# Patient Record
Sex: Female | Born: 1955 | Race: Black or African American | Hispanic: No | Marital: Married | Smoking: Former smoker
Health system: Southern US, Community
[De-identification: ages and names within clinical notes are randomized; demographics above are authoritative.]

## PROBLEM LIST (undated history)

## (undated) DIAGNOSIS — C801 Malignant (primary) neoplasm, unspecified: Secondary | ICD-10-CM

## (undated) DIAGNOSIS — D649 Anemia, unspecified: Secondary | ICD-10-CM

## (undated) DIAGNOSIS — I10 Essential (primary) hypertension: Secondary | ICD-10-CM

## (undated) DIAGNOSIS — Z992 Dependence on renal dialysis: Secondary | ICD-10-CM

## (undated) DIAGNOSIS — I509 Heart failure, unspecified: Secondary | ICD-10-CM

## (undated) DIAGNOSIS — M1990 Unspecified osteoarthritis, unspecified site: Secondary | ICD-10-CM

## (undated) DIAGNOSIS — G629 Polyneuropathy, unspecified: Secondary | ICD-10-CM

## (undated) DIAGNOSIS — K219 Gastro-esophageal reflux disease without esophagitis: Secondary | ICD-10-CM

## (undated) DIAGNOSIS — I2510 Atherosclerotic heart disease of native coronary artery without angina pectoris: Secondary | ICD-10-CM

## (undated) DIAGNOSIS — J449 Chronic obstructive pulmonary disease, unspecified: Secondary | ICD-10-CM

## (undated) DIAGNOSIS — E119 Type 2 diabetes mellitus without complications: Secondary | ICD-10-CM

## (undated) DIAGNOSIS — N189 Chronic kidney disease, unspecified: Secondary | ICD-10-CM

## (undated) DIAGNOSIS — F419 Anxiety disorder, unspecified: Secondary | ICD-10-CM

## (undated) DIAGNOSIS — I739 Peripheral vascular disease, unspecified: Secondary | ICD-10-CM

## (undated) DIAGNOSIS — I639 Cerebral infarction, unspecified: Secondary | ICD-10-CM

## (undated) DIAGNOSIS — M109 Gout, unspecified: Secondary | ICD-10-CM

## (undated) DIAGNOSIS — R0600 Dyspnea, unspecified: Secondary | ICD-10-CM

## (undated) DIAGNOSIS — Z87442 Personal history of urinary calculi: Secondary | ICD-10-CM

## (undated) DIAGNOSIS — G4730 Sleep apnea, unspecified: Secondary | ICD-10-CM

## (undated) HISTORY — PX: ABDOMINAL HYSTERECTOMY: SHX81

## (undated) HISTORY — DX: Type 2 diabetes mellitus without complications: E11.9

## (undated) HISTORY — DX: Essential (primary) hypertension: I10

## (undated) HISTORY — DX: Chronic kidney disease, unspecified: N18.9

## (undated) HISTORY — PX: CARDIAC CATHETERIZATION: SHX172

## (undated) HISTORY — DX: Gout, unspecified: M10.9

## (undated) HISTORY — PX: KIDNEY SURGERY: SHX687

## (undated) HISTORY — PX: EYE SURGERY: SHX253

---

## 2002-12-03 DIAGNOSIS — I639 Cerebral infarction, unspecified: Secondary | ICD-10-CM

## 2002-12-03 HISTORY — DX: Cerebral infarction, unspecified: I63.9

## 2006-08-01 ENCOUNTER — Ambulatory Visit: Payer: Self-pay | Admitting: Nephrology

## 2006-08-02 ENCOUNTER — Other Ambulatory Visit: Payer: Self-pay

## 2006-08-02 ENCOUNTER — Ambulatory Visit: Payer: Self-pay | Admitting: Vascular Surgery

## 2006-08-09 ENCOUNTER — Ambulatory Visit: Payer: Self-pay | Admitting: Vascular Surgery

## 2007-01-31 ENCOUNTER — Ambulatory Visit: Payer: Self-pay | Admitting: Vascular Surgery

## 2007-02-04 ENCOUNTER — Ambulatory Visit: Payer: Self-pay | Admitting: Internal Medicine

## 2007-02-17 ENCOUNTER — Other Ambulatory Visit: Payer: Self-pay

## 2007-02-17 ENCOUNTER — Ambulatory Visit: Payer: Self-pay | Admitting: Vascular Surgery

## 2007-02-26 ENCOUNTER — Ambulatory Visit: Payer: Self-pay | Admitting: Vascular Surgery

## 2007-03-05 ENCOUNTER — Ambulatory Visit: Payer: Self-pay | Admitting: Nephrology

## 2007-08-11 ENCOUNTER — Ambulatory Visit: Payer: Self-pay | Admitting: Vascular Surgery

## 2007-10-07 ENCOUNTER — Ambulatory Visit: Payer: Self-pay | Admitting: Nephrology

## 2007-12-24 ENCOUNTER — Emergency Department: Payer: Self-pay | Admitting: Emergency Medicine

## 2007-12-24 ENCOUNTER — Other Ambulatory Visit: Payer: Self-pay

## 2008-10-23 ENCOUNTER — Inpatient Hospital Stay: Payer: Self-pay | Admitting: Internal Medicine

## 2008-12-13 ENCOUNTER — Emergency Department: Payer: Self-pay | Admitting: Internal Medicine

## 2009-01-25 ENCOUNTER — Ambulatory Visit: Payer: Self-pay | Admitting: Cardiology

## 2009-01-25 ENCOUNTER — Inpatient Hospital Stay: Payer: Self-pay | Admitting: Internal Medicine

## 2009-06-01 ENCOUNTER — Emergency Department: Payer: Self-pay | Admitting: Emergency Medicine

## 2009-08-08 ENCOUNTER — Emergency Department: Payer: Self-pay | Admitting: Emergency Medicine

## 2009-08-09 ENCOUNTER — Inpatient Hospital Stay: Payer: Self-pay | Admitting: Specialist

## 2009-12-19 ENCOUNTER — Emergency Department: Payer: Self-pay | Admitting: Emergency Medicine

## 2010-02-24 IMAGING — CT CT HEAD WITHOUT CONTRAST
2 of 3 series · 17 of 30 positions shown, 20 images · non-contrast
Comparison: none

REASON FOR EXAM: fall/epistaxis
COMMENTS:

[Series 3: bone · axial · 0.38mm/px · z∈[+394,+504]mm · 7 of 30 slices shown]
[im 4/30  bone]
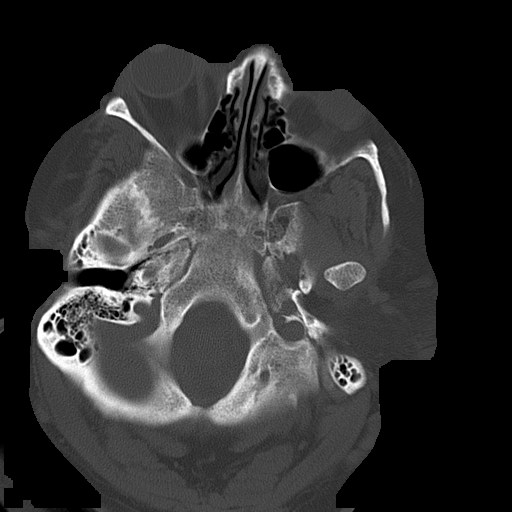
[im 8/30  bone]
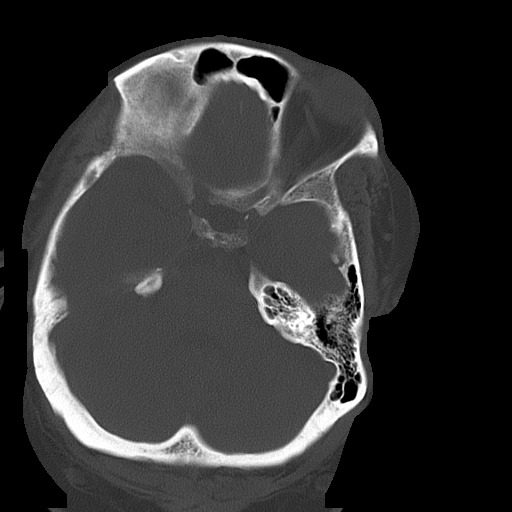
[im 11/30  bone]
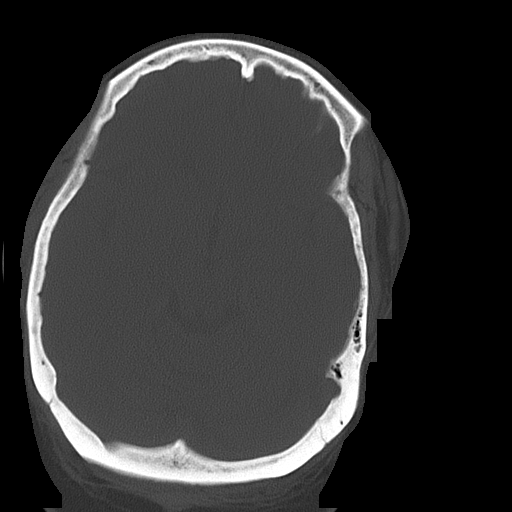
[im 15/30  bone]
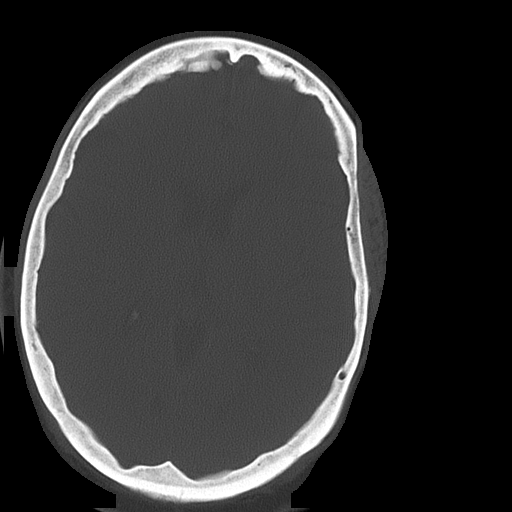
[im 19/30  bone]
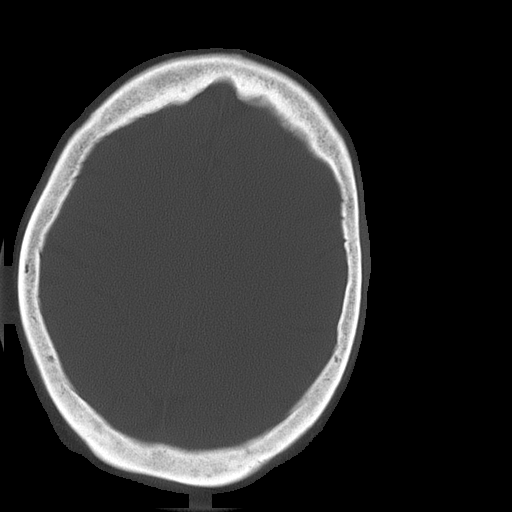
[im 22/30  bone]
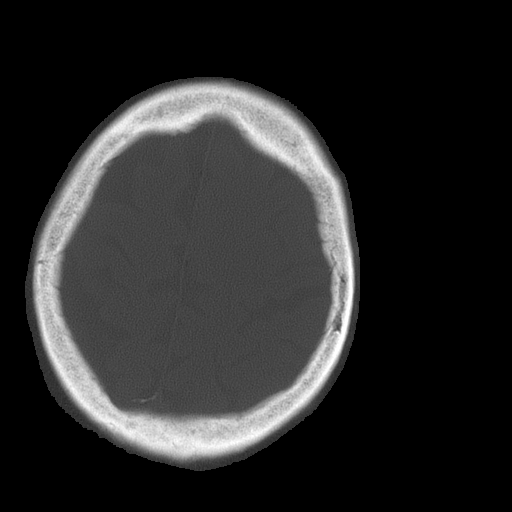
[im 26/30  bone]
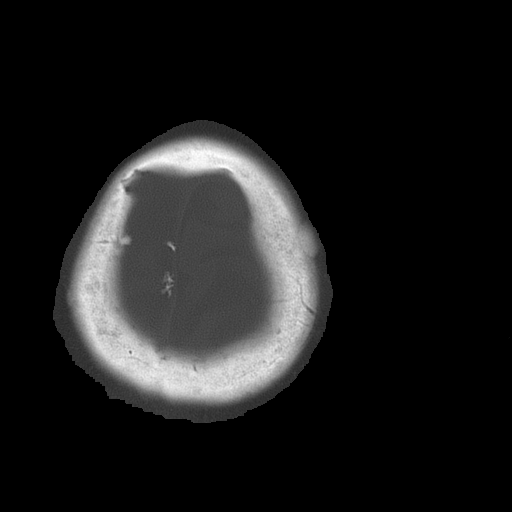

[Series 604: (id) · axial · 0.38mm/px · z∈[+356,+507]mm · 10 of 39 slices shown, 13 images]
[im 4/39  brain]
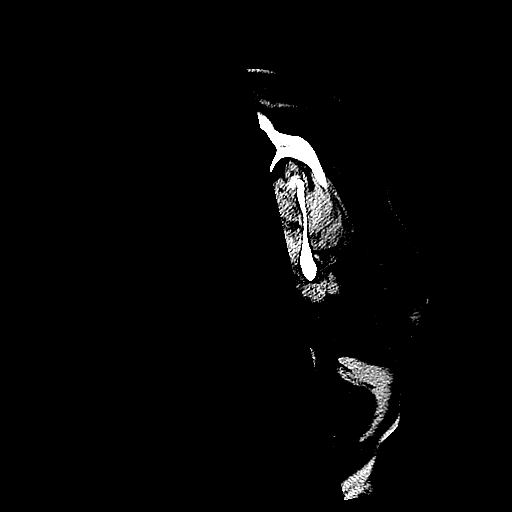
[im 4/39  bone]
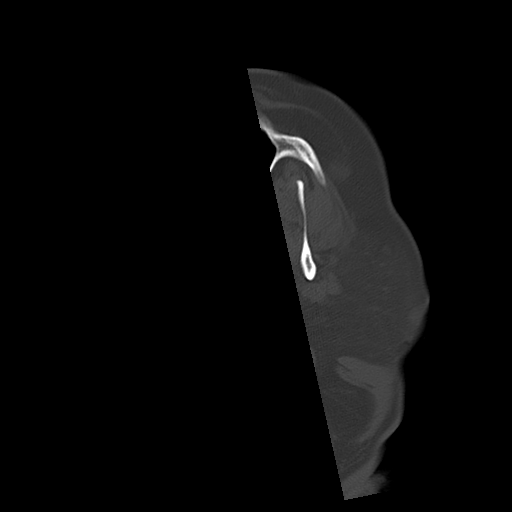
[im 7/39  brain]
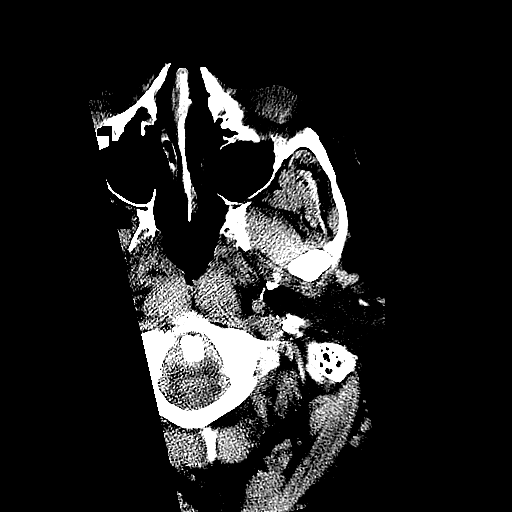
[im 11/39  brain]
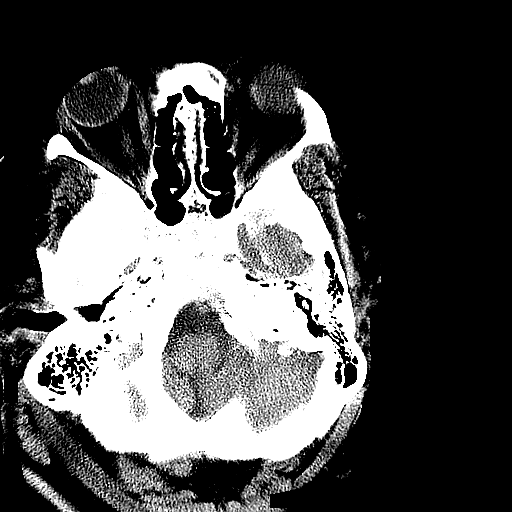
[im 14/39  brain]
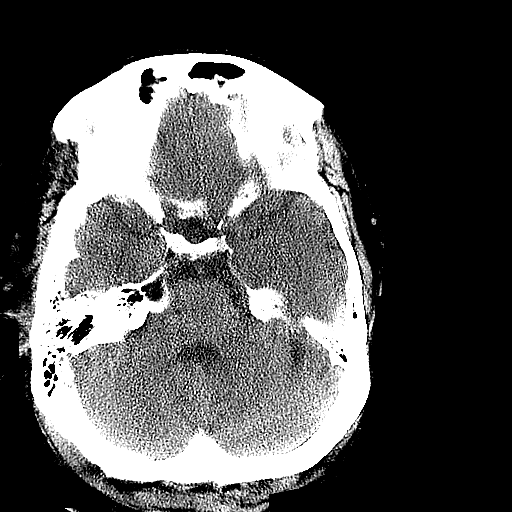
[im 18/39  brain]
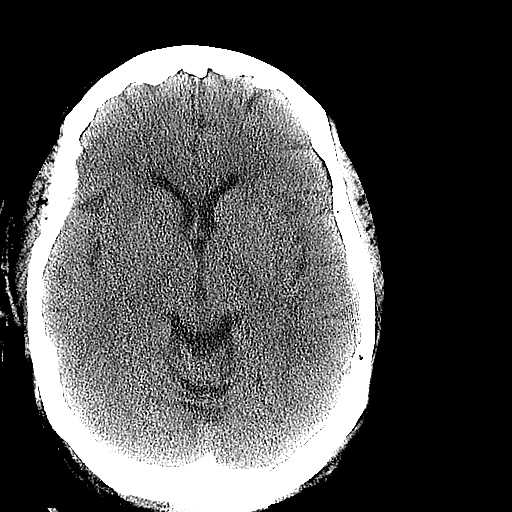
[im 18/39  bone]
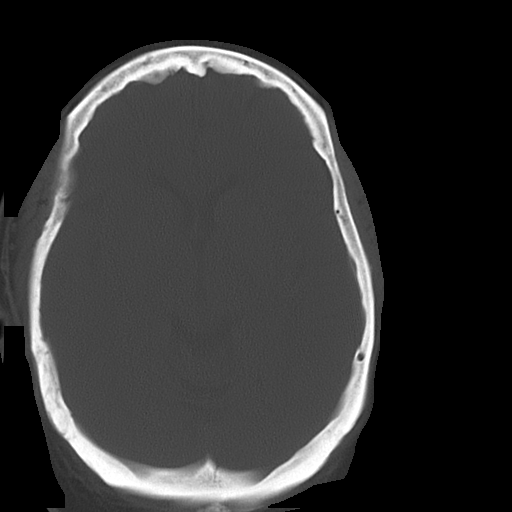
[im 21/39  brain]
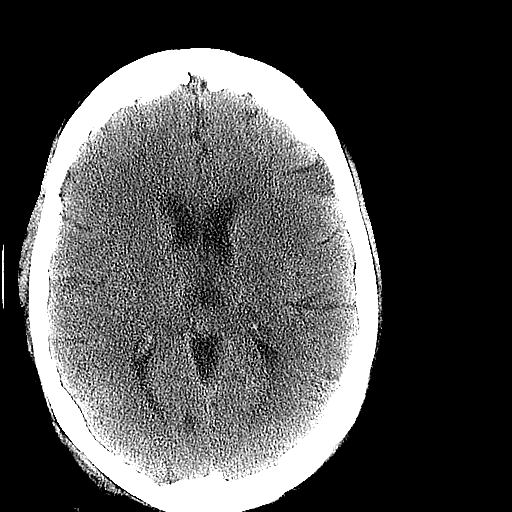
[im 25/39  brain]
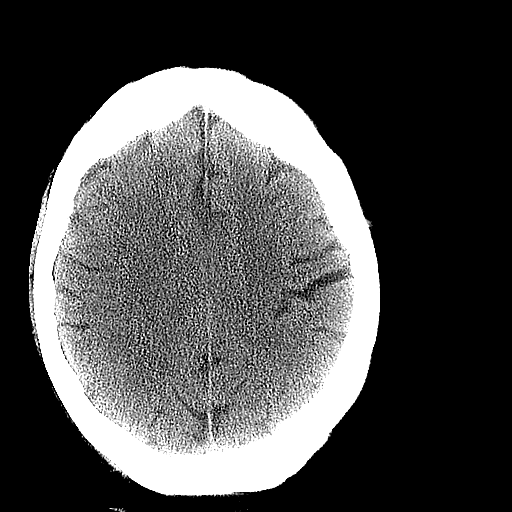
[im 28/39  brain]
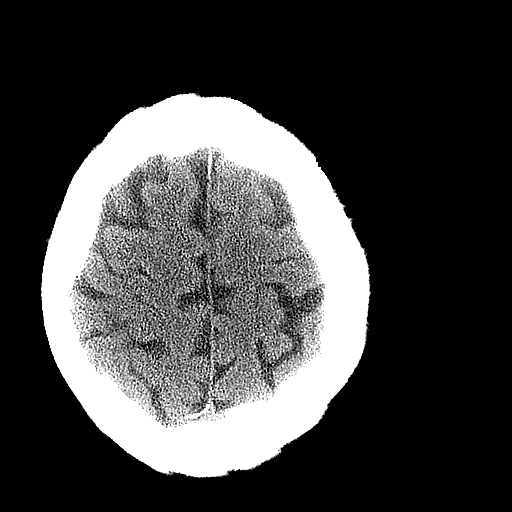
[im 32/39  brain]
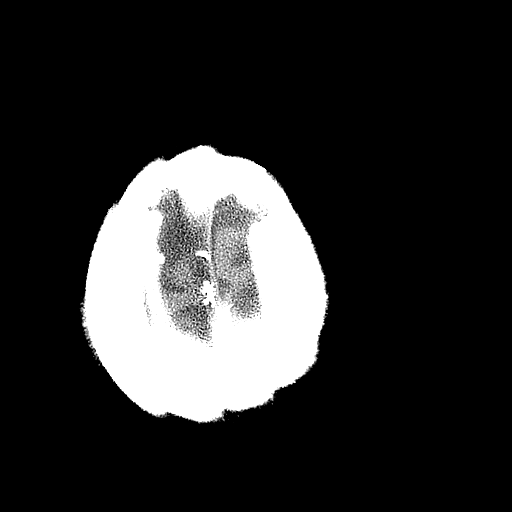
[im 32/39  bone]
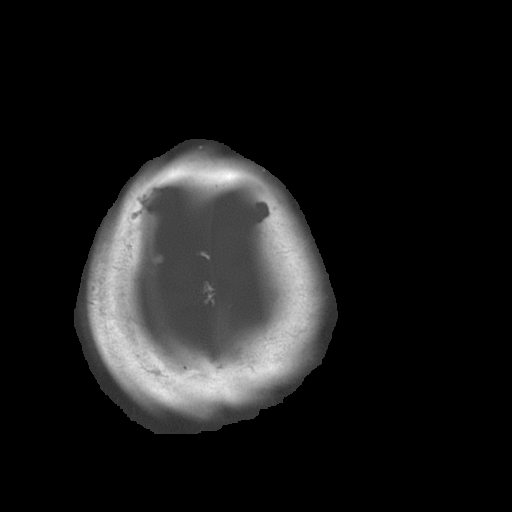
[im 35/39  brain]
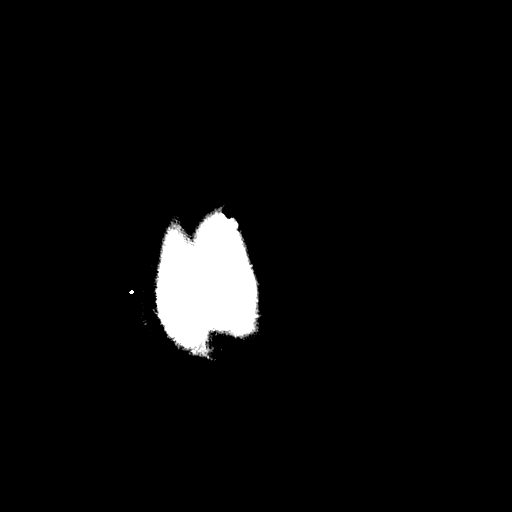

[17 of 30 positions shown; findings below may reference images not displayed]

PROCEDURE:     CT  - CT HEAD WITHOUT CONTRAST  - December 19, 2009 [DATE]

RESULT:     Axial CT scanning was performed through the brain at 5 mm
intervals and slice thicknesses. Comparison is made to the study 23 September, 2009.

The ventricles are normal in size and position. There is no intracranial
hemorrhage nor intracranial mass effect. The cerebellum and brainstem are
normal in density. At bone window settings I do not see evidence of an acute
skull fracture.
IMPRESSION: I do not see evidence of acute intracranial abnormality.
The paranasal sinuses exhibit no evidence of acute inflammation. The nasal
passages appear patent where visualized.

## 2010-08-04 ENCOUNTER — Emergency Department: Payer: Self-pay | Admitting: Emergency Medicine

## 2010-12-17 ENCOUNTER — Emergency Department: Payer: Self-pay | Admitting: Emergency Medicine

## 2011-02-12 DIAGNOSIS — M1991 Primary osteoarthritis, unspecified site: Secondary | ICD-10-CM | POA: Insufficient documentation

## 2011-02-13 ENCOUNTER — Observation Stay: Payer: Self-pay | Admitting: Internal Medicine

## 2011-03-16 ENCOUNTER — Emergency Department: Payer: Self-pay | Admitting: Emergency Medicine

## 2011-04-18 ENCOUNTER — Emergency Department: Payer: Self-pay | Admitting: Emergency Medicine

## 2011-05-02 ENCOUNTER — Ambulatory Visit: Payer: Self-pay | Admitting: Vascular Surgery

## 2011-05-03 DIAGNOSIS — K8020 Calculus of gallbladder without cholecystitis without obstruction: Secondary | ICD-10-CM | POA: Insufficient documentation

## 2011-05-14 ENCOUNTER — Ambulatory Visit: Payer: Self-pay | Admitting: Ophthalmology

## 2011-05-14 DIAGNOSIS — I119 Hypertensive heart disease without heart failure: Secondary | ICD-10-CM

## 2011-05-15 ENCOUNTER — Ambulatory Visit: Payer: Self-pay | Admitting: Ophthalmology

## 2011-06-20 ENCOUNTER — Ambulatory Visit: Payer: Self-pay | Admitting: Ophthalmology

## 2011-07-02 ENCOUNTER — Ambulatory Visit: Payer: Self-pay | Admitting: Ophthalmology

## 2011-07-24 ENCOUNTER — Inpatient Hospital Stay: Payer: Self-pay | Admitting: Internal Medicine

## 2011-12-10 ENCOUNTER — Observation Stay: Payer: Self-pay | Admitting: Internal Medicine

## 2011-12-10 LAB — COMPREHENSIVE METABOLIC PANEL
Albumin: 3.2 g/dL — ABNORMAL LOW (ref 3.4–5.0)
Alkaline Phosphatase: 94 U/L (ref 50–136)
Anion Gap: 13 (ref 7–16)
BUN: 61 mg/dL — ABNORMAL HIGH (ref 7–18)
Bilirubin,Total: 0.3 mg/dL (ref 0.2–1.0)
Calcium, Total: 8.7 mg/dL (ref 8.5–10.1)
Chloride: 99 mmol/L (ref 98–107)
Co2: 28 mmol/L (ref 21–32)
Creatinine: 10.94 mg/dL — ABNORMAL HIGH (ref 0.60–1.30)
EGFR (African American): 5 — ABNORMAL LOW
EGFR (Non-African Amer.): 4 — ABNORMAL LOW
Glucose: 89 mg/dL (ref 65–99)
Osmolality: 296 (ref 275–301)
Potassium: 4.9 mmol/L (ref 3.5–5.1)
SGOT(AST): 17 U/L (ref 15–37)
SGPT (ALT): 33 U/L
Sodium: 140 mmol/L (ref 136–145)
Total Protein: 7.6 g/dL (ref 6.4–8.2)

## 2011-12-10 LAB — CBC
HCT: 32.3 % — ABNORMAL LOW (ref 35.0–47.0)
HGB: 10.8 g/dL — ABNORMAL LOW (ref 12.0–16.0)
MCH: 30.4 pg (ref 26.0–34.0)
MCHC: 33.5 g/dL (ref 32.0–36.0)
MCV: 91 fL (ref 80–100)
Platelet: 278 10*3/uL (ref 150–440)
RBC: 3.56 10*6/uL — ABNORMAL LOW (ref 3.80–5.20)
RDW: 14.1 % (ref 11.5–14.5)
WBC: 6.8 10*3/uL (ref 3.6–11.0)

## 2011-12-10 LAB — CK TOTAL AND CKMB (NOT AT ARMC)
CK, Total: 167 U/L (ref 21–215)
CK-MB: 2.6 ng/mL (ref 0.5–3.6)

## 2011-12-10 LAB — TROPONIN I: Troponin-I: 0.09 ng/mL — ABNORMAL HIGH

## 2011-12-10 LAB — TSH: Thyroid Stimulating Horm: 1.12 u[IU]/mL

## 2011-12-11 LAB — LIPID PANEL
Cholesterol: 174 mg/dL (ref 0–200)
HDL Cholesterol: 31 mg/dL — ABNORMAL LOW (ref 40–60)
Ldl Cholesterol, Calc: 75 mg/dL (ref 0–100)
Triglycerides: 338 mg/dL — ABNORMAL HIGH (ref 0–200)
VLDL Cholesterol, Calc: 68 mg/dL — ABNORMAL HIGH (ref 5–40)

## 2011-12-11 LAB — BASIC METABOLIC PANEL
Anion Gap: 12 (ref 7–16)
BUN: 69 mg/dL — ABNORMAL HIGH (ref 7–18)
Calcium, Total: 8.4 mg/dL — ABNORMAL LOW (ref 8.5–10.1)
Chloride: 97 mmol/L — ABNORMAL LOW (ref 98–107)
Co2: 29 mmol/L (ref 21–32)
Creatinine: 11.94 mg/dL — ABNORMAL HIGH (ref 0.60–1.30)
EGFR (African American): 4 — ABNORMAL LOW
EGFR (Non-African Amer.): 3 — ABNORMAL LOW
Glucose: 145 mg/dL — ABNORMAL HIGH (ref 65–99)
Osmolality: 298 (ref 275–301)
Potassium: 4.8 mmol/L (ref 3.5–5.1)
Sodium: 138 mmol/L (ref 136–145)

## 2011-12-11 LAB — HEMOGLOBIN A1C: Hemoglobin A1C: 8.2 % — ABNORMAL HIGH (ref 4.2–6.3)

## 2011-12-11 LAB — PHOSPHORUS: Phosphorus: 5.2 mg/dL — ABNORMAL HIGH (ref 2.5–4.9)

## 2011-12-11 LAB — CK TOTAL AND CKMB (NOT AT ARMC)
CK, Total: 151 U/L (ref 21–215)
CK, Total: 168 U/L (ref 21–215)
CK-MB: 2.3 ng/mL (ref 0.5–3.6)
CK-MB: 2.9 ng/mL (ref 0.5–3.6)

## 2011-12-15 ENCOUNTER — Emergency Department: Payer: Self-pay | Admitting: Emergency Medicine

## 2012-03-24 ENCOUNTER — Inpatient Hospital Stay: Payer: Self-pay | Admitting: Internal Medicine

## 2012-03-24 LAB — BASIC METABOLIC PANEL
Anion Gap: 12 (ref 7–16)
BUN: 38 mg/dL — ABNORMAL HIGH (ref 7–18)
Calcium, Total: 8.7 mg/dL (ref 8.5–10.1)
Chloride: 92 mmol/L — ABNORMAL LOW (ref 98–107)
Co2: 27 mmol/L (ref 21–32)
Creatinine: 9.88 mg/dL — ABNORMAL HIGH (ref 0.60–1.30)
EGFR (African American): 5 — ABNORMAL LOW
EGFR (Non-African Amer.): 4 — ABNORMAL LOW
Glucose: 166 mg/dL — ABNORMAL HIGH (ref 65–99)
Osmolality: 275 (ref 275–301)
Potassium: 4.1 mmol/L (ref 3.5–5.1)
Sodium: 131 mmol/L — ABNORMAL LOW (ref 136–145)

## 2012-03-24 LAB — CBC
HCT: 31.6 % — ABNORMAL LOW (ref 35.0–47.0)
HGB: 10.5 g/dL — ABNORMAL LOW (ref 12.0–16.0)
MCH: 29.9 pg (ref 26.0–34.0)
MCHC: 33.2 g/dL (ref 32.0–36.0)
MCV: 90 fL (ref 80–100)
Platelet: 235 10*3/uL (ref 150–440)
RBC: 3.51 10*6/uL — ABNORMAL LOW (ref 3.80–5.20)
RDW: 14.4 % (ref 11.5–14.5)
WBC: 5.8 10*3/uL (ref 3.6–11.0)

## 2012-03-24 LAB — RAPID INFLUENZA A&B ANTIGENS

## 2012-03-25 LAB — CBC WITH DIFFERENTIAL/PLATELET
Basophil #: 0.1 10*3/uL (ref 0.0–0.1)
Basophil %: 1 %
Eosinophil #: 0 10*3/uL (ref 0.0–0.7)
Eosinophil %: 0.5 %
HCT: 27.8 % — ABNORMAL LOW (ref 35.0–47.0)
HGB: 9.3 g/dL — ABNORMAL LOW (ref 12.0–16.0)
Lymphocyte #: 1.3 10*3/uL (ref 1.0–3.6)
Lymphocyte %: 18.9 %
MCH: 30.4 pg (ref 26.0–34.0)
MCHC: 33.6 g/dL (ref 32.0–36.0)
MCV: 91 fL (ref 80–100)
Monocyte #: 0.5 x10 3/mm (ref 0.2–0.9)
Monocyte %: 7.7 %
Neutrophil #: 5.1 10*3/uL (ref 1.4–6.5)
Neutrophil %: 71.9 %
Platelet: 208 10*3/uL (ref 150–440)
RBC: 3.07 10*6/uL — ABNORMAL LOW (ref 3.80–5.20)
RDW: 14.4 % (ref 11.5–14.5)
WBC: 7.1 10*3/uL (ref 3.6–11.0)

## 2012-03-25 LAB — COMPREHENSIVE METABOLIC PANEL
Albumin: 2.8 g/dL — ABNORMAL LOW (ref 3.4–5.0)
Alkaline Phosphatase: 71 U/L (ref 50–136)
Anion Gap: 12 (ref 7–16)
BUN: 53 mg/dL — ABNORMAL HIGH (ref 7–18)
Bilirubin,Total: 0.4 mg/dL (ref 0.2–1.0)
Calcium, Total: 8.3 mg/dL — ABNORMAL LOW (ref 8.5–10.1)
Chloride: 91 mmol/L — ABNORMAL LOW (ref 98–107)
Co2: 26 mmol/L (ref 21–32)
Creatinine: 12.68 mg/dL — ABNORMAL HIGH (ref 0.60–1.30)
EGFR (African American): 3 — ABNORMAL LOW
EGFR (Non-African Amer.): 3 — ABNORMAL LOW
Glucose: 144 mg/dL — ABNORMAL HIGH (ref 65–99)
Osmolality: 276 (ref 275–301)
Potassium: 4.6 mmol/L (ref 3.5–5.1)
SGOT(AST): 23 U/L (ref 15–37)
SGPT (ALT): 36 U/L
Sodium: 129 mmol/L — ABNORMAL LOW (ref 136–145)
Total Protein: 7.1 g/dL (ref 6.4–8.2)

## 2012-03-27 LAB — RENAL FUNCTION PANEL
Albumin: 2.7 g/dL — ABNORMAL LOW (ref 3.4–5.0)
Anion Gap: 10 (ref 7–16)
BUN: 53 mg/dL — ABNORMAL HIGH (ref 7–18)
Calcium, Total: 8.1 mg/dL — ABNORMAL LOW (ref 8.5–10.1)
Chloride: 89 mmol/L — ABNORMAL LOW (ref 98–107)
Co2: 26 mmol/L (ref 21–32)
Creatinine: 12.27 mg/dL — ABNORMAL HIGH (ref 0.60–1.30)
EGFR (African American): 3 — ABNORMAL LOW
EGFR (Non-African Amer.): 3 — ABNORMAL LOW
Glucose: 200 mg/dL — ABNORMAL HIGH (ref 65–99)
Osmolality: 272 (ref 275–301)
Phosphorus: 4.7 mg/dL (ref 2.5–4.9)
Potassium: 4.1 mmol/L (ref 3.5–5.1)
Sodium: 125 mmol/L — ABNORMAL LOW (ref 136–145)

## 2012-03-27 LAB — CBC WITH DIFFERENTIAL/PLATELET
Basophil #: 0 10*3/uL (ref 0.0–0.1)
Basophil %: 0.6 %
Eosinophil #: 0.3 10*3/uL (ref 0.0–0.7)
Eosinophil %: 6.4 %
HCT: 28.4 % — ABNORMAL LOW (ref 35.0–47.0)
HGB: 9.5 g/dL — ABNORMAL LOW (ref 12.0–16.0)
Lymphocyte #: 1.6 10*3/uL (ref 1.0–3.6)
Lymphocyte %: 29.1 %
MCH: 30.4 pg (ref 26.0–34.0)
MCHC: 33.4 g/dL (ref 32.0–36.0)
MCV: 91 fL (ref 80–100)
Monocyte #: 0.5 x10 3/mm (ref 0.2–0.9)
Monocyte %: 9.2 %
Neutrophil #: 2.9 10*3/uL (ref 1.4–6.5)
Neutrophil %: 54.7 %
Platelet: 217 10*3/uL (ref 150–440)
RBC: 3.13 10*6/uL — ABNORMAL LOW (ref 3.80–5.20)
RDW: 14.2 % (ref 11.5–14.5)
WBC: 5.4 10*3/uL (ref 3.6–11.0)

## 2012-03-28 LAB — RENAL FUNCTION PANEL
Albumin: 2.7 g/dL — ABNORMAL LOW (ref 3.4–5.0)
Anion Gap: 10 (ref 7–16)
BUN: 34 mg/dL — ABNORMAL HIGH (ref 7–18)
Calcium, Total: 7.9 mg/dL — ABNORMAL LOW (ref 8.5–10.1)
Chloride: 90 mmol/L — ABNORMAL LOW (ref 98–107)
Co2: 30 mmol/L (ref 21–32)
Creatinine: 8.7 mg/dL — ABNORMAL HIGH (ref 0.60–1.30)
EGFR (African American): 5 — ABNORMAL LOW
EGFR (Non-African Amer.): 5 — ABNORMAL LOW
Glucose: 134 mg/dL — ABNORMAL HIGH (ref 65–99)
Osmolality: 270 (ref 275–301)
Phosphorus: 3.3 mg/dL (ref 2.5–4.9)
Potassium: 4 mmol/L (ref 3.5–5.1)
Sodium: 130 mmol/L — ABNORMAL LOW (ref 136–145)

## 2012-03-30 LAB — CULTURE, BLOOD (SINGLE)

## 2012-03-30 LAB — EXPECTORATED SPUTUM ASSESSMENT W GRAM STAIN, RFLX TO RESP C

## 2012-06-20 ENCOUNTER — Emergency Department: Payer: Self-pay | Admitting: *Deleted

## 2012-06-20 LAB — COMPREHENSIVE METABOLIC PANEL
Albumin: 3.2 g/dL — ABNORMAL LOW (ref 3.4–5.0)
Alkaline Phosphatase: 92 U/L (ref 50–136)
Anion Gap: 9 (ref 7–16)
BUN: 15 mg/dL (ref 7–18)
Bilirubin,Total: 0.3 mg/dL (ref 0.2–1.0)
Calcium, Total: 8.5 mg/dL (ref 8.5–10.1)
Chloride: 94 mmol/L — ABNORMAL LOW (ref 98–107)
Co2: 28 mmol/L (ref 21–32)
Creatinine: 4.65 mg/dL — ABNORMAL HIGH (ref 0.60–1.30)
EGFR (African American): 11 — ABNORMAL LOW
EGFR (Non-African Amer.): 10 — ABNORMAL LOW
Glucose: 193 mg/dL — ABNORMAL HIGH (ref 65–99)
Osmolality: 269 (ref 275–301)
Potassium: 4.1 mmol/L (ref 3.5–5.1)
SGOT(AST): 19 U/L (ref 15–37)
SGPT (ALT): 25 U/L
Sodium: 131 mmol/L — ABNORMAL LOW (ref 136–145)
Total Protein: 8.5 g/dL — ABNORMAL HIGH (ref 6.4–8.2)

## 2012-06-20 LAB — CBC
HCT: 34.4 % — ABNORMAL LOW (ref 35.0–47.0)
HGB: 11.3 g/dL — ABNORMAL LOW (ref 12.0–16.0)
MCH: 30.1 pg (ref 26.0–34.0)
MCHC: 32.9 g/dL (ref 32.0–36.0)
MCV: 92 fL (ref 80–100)
Platelet: 262 10*3/uL (ref 150–440)
RBC: 3.75 10*6/uL — ABNORMAL LOW (ref 3.80–5.20)
RDW: 14.4 % (ref 11.5–14.5)
WBC: 6 10*3/uL (ref 3.6–11.0)

## 2012-06-20 LAB — CK TOTAL AND CKMB (NOT AT ARMC)
CK, Total: 245 U/L — ABNORMAL HIGH (ref 21–215)
CK-MB: 2.7 ng/mL (ref 0.5–3.6)

## 2012-06-20 LAB — TROPONIN I: Troponin-I: 0.03 ng/mL

## 2013-03-09 ENCOUNTER — Inpatient Hospital Stay: Payer: Self-pay | Admitting: Internal Medicine

## 2013-03-09 DIAGNOSIS — I059 Rheumatic mitral valve disease, unspecified: Secondary | ICD-10-CM

## 2013-03-09 LAB — BASIC METABOLIC PANEL
Anion Gap: 7 (ref 7–16)
BUN: 38 mg/dL — ABNORMAL HIGH (ref 7–18)
Calcium, Total: 8.2 mg/dL — ABNORMAL LOW (ref 8.5–10.1)
Chloride: 99 mmol/L (ref 98–107)
Co2: 28 mmol/L (ref 21–32)
Creatinine: 9.65 mg/dL — ABNORMAL HIGH (ref 0.60–1.30)
EGFR (African American): 5 — ABNORMAL LOW
EGFR (Non-African Amer.): 4 — ABNORMAL LOW
Glucose: 77 mg/dL (ref 65–99)
Osmolality: 276 (ref 275–301)
Potassium: 4.1 mmol/L (ref 3.5–5.1)
Sodium: 134 mmol/L — ABNORMAL LOW (ref 136–145)

## 2013-03-09 LAB — CBC
HCT: 32.5 % — ABNORMAL LOW (ref 35.0–47.0)
HGB: 10.9 g/dL — ABNORMAL LOW (ref 12.0–16.0)
MCH: 30.4 pg (ref 26.0–34.0)
MCHC: 33.7 g/dL (ref 32.0–36.0)
MCV: 90 fL (ref 80–100)
Platelet: 263 10*3/uL (ref 150–440)
RBC: 3.6 10*6/uL — ABNORMAL LOW (ref 3.80–5.20)
RDW: 15 % — ABNORMAL HIGH (ref 11.5–14.5)
WBC: 6.7 10*3/uL (ref 3.6–11.0)

## 2013-03-09 LAB — HEPATIC FUNCTION PANEL A (ARMC)
Albumin: 3.1 g/dL — ABNORMAL LOW (ref 3.4–5.0)
Alkaline Phosphatase: 158 U/L — ABNORMAL HIGH (ref 50–136)
Bilirubin, Direct: <0.1 mg/dL (ref 0.00–0.20)
Bilirubin,Total: 0.4 mg/dL (ref 0.2–1.0)
SGOT(AST): 17 U/L (ref 15–37)
SGPT (ALT): 29 U/L (ref 12–78)
Total Protein: 7.5 g/dL (ref 6.4–8.2)

## 2013-03-09 LAB — APTT: Activated PTT: 35 secs (ref 23.6–35.9)

## 2013-03-09 LAB — PRO B NATRIURETIC PEPTIDE: B-Type Natriuretic Peptide: 3881 pg/mL — ABNORMAL HIGH (ref 0–125)

## 2013-03-09 LAB — CK TOTAL AND CKMB (NOT AT ARMC)
CK, Total: 143 U/L (ref 21–215)
CK-MB: 1.4 ng/mL (ref 0.5–3.6)

## 2013-03-09 LAB — TROPONIN I: Troponin-I: 0.04 ng/mL

## 2013-03-09 LAB — PROTIME-INR
INR: 1
Prothrombin Time: 13.5 secs (ref 11.5–14.7)

## 2013-03-10 LAB — CBC WITH DIFFERENTIAL/PLATELET
Basophil #: 0.1 10*3/uL (ref 0.0–0.1)
Basophil %: 1.9 %
Eosinophil #: 0.1 10*3/uL (ref 0.0–0.7)
Eosinophil %: 1.9 %
HCT: 28.3 % — ABNORMAL LOW (ref 35.0–47.0)
HGB: 9.6 g/dL — ABNORMAL LOW (ref 12.0–16.0)
Lymphocyte #: 2.2 10*3/uL (ref 1.0–3.6)
Lymphocyte %: 34.1 %
MCH: 30.5 pg (ref 26.0–34.0)
MCHC: 33.9 g/dL (ref 32.0–36.0)
MCV: 90 fL (ref 80–100)
Monocyte #: 0.7 x10 3/mm (ref 0.2–0.9)
Monocyte %: 10 %
Neutrophil #: 3.4 10*3/uL (ref 1.4–6.5)
Neutrophil %: 52.1 %
Platelet: 246 10*3/uL (ref 150–440)
RBC: 3.15 10*6/uL — ABNORMAL LOW (ref 3.80–5.20)
RDW: 15 % — ABNORMAL HIGH (ref 11.5–14.5)
WBC: 6.6 10*3/uL (ref 3.6–11.0)

## 2013-03-10 LAB — BASIC METABOLIC PANEL
Anion Gap: 8 (ref 7–16)
BUN: 43 mg/dL — ABNORMAL HIGH (ref 7–18)
Calcium, Total: 7.9 mg/dL — ABNORMAL LOW (ref 8.5–10.1)
Chloride: 97 mmol/L — ABNORMAL LOW (ref 98–107)
Co2: 29 mmol/L (ref 21–32)
Creatinine: 10.35 mg/dL — ABNORMAL HIGH (ref 0.60–1.30)
EGFR (African American): 4 — ABNORMAL LOW
EGFR (Non-African Amer.): 4 — ABNORMAL LOW
Glucose: 139 mg/dL — ABNORMAL HIGH (ref 65–99)
Osmolality: 281 (ref 275–301)
Potassium: 4 mmol/L (ref 3.5–5.1)
Sodium: 134 mmol/L — ABNORMAL LOW (ref 136–145)

## 2013-03-10 LAB — CK TOTAL AND CKMB (NOT AT ARMC)
CK, Total: 120 U/L (ref 21–215)
CK, Total: 123 U/L (ref 21–215)
CK-MB: 1.3 ng/mL (ref 0.5–3.6)
CK-MB: 1.4 ng/mL (ref 0.5–3.6)

## 2013-03-10 LAB — TROPONIN I
Troponin-I: 0.05 ng/mL
Troponin-I: 0.06 ng/mL — ABNORMAL HIGH

## 2013-03-10 LAB — PHOSPHORUS: Phosphorus: 2.8 mg/dL (ref 2.5–4.9)

## 2013-05-29 ENCOUNTER — Emergency Department: Payer: Self-pay | Admitting: Emergency Medicine

## 2013-06-01 LAB — PATHOLOGY REPORT

## 2013-09-28 DIAGNOSIS — E119 Type 2 diabetes mellitus without complications: Secondary | ICD-10-CM | POA: Insufficient documentation

## 2013-09-28 DIAGNOSIS — B359 Dermatophytosis, unspecified: Secondary | ICD-10-CM | POA: Insufficient documentation

## 2013-09-28 DIAGNOSIS — Z992 Dependence on renal dialysis: Secondary | ICD-10-CM

## 2013-09-28 DIAGNOSIS — N186 End stage renal disease: Secondary | ICD-10-CM | POA: Insufficient documentation

## 2014-02-16 DIAGNOSIS — I10 Essential (primary) hypertension: Secondary | ICD-10-CM | POA: Insufficient documentation

## 2014-02-16 DIAGNOSIS — M109 Gout, unspecified: Secondary | ICD-10-CM | POA: Insufficient documentation

## 2014-04-09 DIAGNOSIS — J449 Chronic obstructive pulmonary disease, unspecified: Secondary | ICD-10-CM | POA: Insufficient documentation

## 2014-06-14 ENCOUNTER — Inpatient Hospital Stay: Payer: Self-pay | Admitting: Internal Medicine

## 2014-06-14 LAB — COMPREHENSIVE METABOLIC PANEL
Albumin: 3 g/dL — ABNORMAL LOW (ref 3.4–5.0)
Alkaline Phosphatase: 235 U/L — ABNORMAL HIGH
Anion Gap: 11 (ref 7–16)
BUN: 46 mg/dL — ABNORMAL HIGH (ref 7–18)
Bilirubin,Total: 0.5 mg/dL (ref 0.2–1.0)
Calcium, Total: 8.4 mg/dL — ABNORMAL LOW (ref 8.5–10.1)
Chloride: 89 mmol/L — ABNORMAL LOW (ref 98–107)
Co2: 28 mmol/L (ref 21–32)
Creatinine: 11.12 mg/dL — ABNORMAL HIGH (ref 0.60–1.30)
EGFR (African American): 4 — ABNORMAL LOW
EGFR (Non-African Amer.): 3 — ABNORMAL LOW
Glucose: 106 mg/dL — ABNORMAL HIGH (ref 65–99)
Osmolality: 269 (ref 275–301)
Potassium: 4.9 mmol/L (ref 3.5–5.1)
SGOT(AST): 13 U/L — ABNORMAL LOW (ref 15–37)
SGPT (ALT): 27 U/L (ref 12–78)
Sodium: 128 mmol/L — ABNORMAL LOW (ref 136–145)
Total Protein: 8.3 g/dL — ABNORMAL HIGH (ref 6.4–8.2)

## 2014-06-14 LAB — MAGNESIUM: Magnesium: 1.8 mg/dL

## 2014-06-14 LAB — CBC WITH DIFFERENTIAL/PLATELET
Basophil #: 0.1 10*3/uL (ref 0.0–0.1)
Basophil %: 0.7 %
Eosinophil #: 0 10*3/uL (ref 0.0–0.7)
Eosinophil %: 0.1 %
HCT: 36.9 % (ref 35.0–47.0)
HGB: 12.2 g/dL (ref 12.0–16.0)
Lymphocyte #: 1.5 10*3/uL (ref 1.0–3.6)
Lymphocyte %: 12 %
MCH: 30.7 pg (ref 26.0–34.0)
MCHC: 33.2 g/dL (ref 32.0–36.0)
MCV: 92 fL (ref 80–100)
Monocyte #: 1.2 x10 3/mm — ABNORMAL HIGH (ref 0.2–0.9)
Monocyte %: 10.1 %
Neutrophil #: 9.3 10*3/uL — ABNORMAL HIGH (ref 1.4–6.5)
Neutrophil %: 77.1 %
Platelet: 248 10*3/uL (ref 150–440)
RBC: 3.99 10*6/uL (ref 3.80–5.20)
RDW: 14.2 % (ref 11.5–14.5)
WBC: 12.1 10*3/uL — ABNORMAL HIGH (ref 3.6–11.0)

## 2014-06-14 LAB — PROTIME-INR
INR: 1
Prothrombin Time: 13.5 secs (ref 11.5–14.7)

## 2014-06-14 LAB — PHOSPHORUS: Phosphorus: 2.6 mg/dL (ref 2.5–4.9)

## 2014-06-14 LAB — TROPONIN I: Troponin-I: 0.03 ng/mL

## 2014-06-15 LAB — CBC WITH DIFFERENTIAL/PLATELET
Basophil #: 0.1 10*3/uL (ref 0.0–0.1)
Basophil %: 0.8 %
Eosinophil #: 0 10*3/uL (ref 0.0–0.7)
Eosinophil %: 0.4 %
HCT: 33.8 % — ABNORMAL LOW (ref 35.0–47.0)
HGB: 11.1 g/dL — ABNORMAL LOW (ref 12.0–16.0)
Lymphocyte #: 1.6 10*3/uL (ref 1.0–3.6)
Lymphocyte %: 15.4 %
MCH: 30.5 pg (ref 26.0–34.0)
MCHC: 32.9 g/dL (ref 32.0–36.0)
MCV: 93 fL (ref 80–100)
Monocyte #: 1.3 x10 3/mm — ABNORMAL HIGH (ref 0.2–0.9)
Monocyte %: 12.6 %
Neutrophil #: 7.2 10*3/uL — ABNORMAL HIGH (ref 1.4–6.5)
Neutrophil %: 70.8 %
Platelet: 233 10*3/uL (ref 150–440)
RBC: 3.65 10*6/uL — ABNORMAL LOW (ref 3.80–5.20)
RDW: 14.3 % (ref 11.5–14.5)
WBC: 10.2 10*3/uL (ref 3.6–11.0)

## 2014-06-15 LAB — PHOSPHORUS: Phosphorus: 3.3 mg/dL (ref 2.5–4.9)

## 2014-06-19 LAB — CULTURE, BLOOD (SINGLE)

## 2014-06-21 DIAGNOSIS — Z8673 Personal history of transient ischemic attack (TIA), and cerebral infarction without residual deficits: Secondary | ICD-10-CM | POA: Insufficient documentation

## 2014-09-17 ENCOUNTER — Encounter: Payer: Self-pay | Admitting: *Deleted

## 2014-09-17 ENCOUNTER — Other Ambulatory Visit: Payer: Self-pay | Admitting: *Deleted

## 2014-09-17 ENCOUNTER — Ambulatory Visit (INDEPENDENT_AMBULATORY_CARE_PROVIDER_SITE_OTHER): Payer: Medicare Other | Admitting: Podiatry

## 2014-09-17 ENCOUNTER — Encounter: Payer: Self-pay | Admitting: Podiatry

## 2014-09-17 ENCOUNTER — Ambulatory Visit (INDEPENDENT_AMBULATORY_CARE_PROVIDER_SITE_OTHER): Payer: Medicare Other

## 2014-09-17 VITALS — BP 116/60 | HR 98 | Resp 20 | Ht 66.0 in | Wt 308.0 lb

## 2014-09-17 DIAGNOSIS — M2011 Hallux valgus (acquired), right foot: Secondary | ICD-10-CM

## 2014-09-17 DIAGNOSIS — M109 Gout, unspecified: Secondary | ICD-10-CM

## 2014-09-17 DIAGNOSIS — M79673 Pain in unspecified foot: Secondary | ICD-10-CM

## 2014-09-17 DIAGNOSIS — B351 Tinea unguium: Secondary | ICD-10-CM | POA: Diagnosis not present

## 2014-09-17 DIAGNOSIS — M779 Enthesopathy, unspecified: Secondary | ICD-10-CM

## 2014-09-17 DIAGNOSIS — S92301A Fracture of unspecified metatarsal bone(s), right foot, initial encounter for closed fracture: Secondary | ICD-10-CM

## 2014-09-17 DIAGNOSIS — M21611 Bunion of right foot: Secondary | ICD-10-CM

## 2014-09-17 DIAGNOSIS — M2041 Other hammer toe(s) (acquired), right foot: Secondary | ICD-10-CM

## 2014-09-17 MED ORDER — TRIAMCINOLONE ACETONIDE 10 MG/ML IJ SUSP
10.0000 mg | Freq: Once | INTRAMUSCULAR | Status: AC
  Administered 2014-09-17: 10 mg

## 2014-09-17 NOTE — Progress Notes (Signed)
Subjective:     Patient ID: [name], female   DOB: 1956/03/26, 58 y.o.   MRN: [id number]  HPI patient states I'm getting swelling in my left big toe that bothers me and on my right foot I'm getting some pain in the outside and I'm not sure if I did anything to the bone   Review of Systems     Objective:   Physical Exam Neurovascular status unchanged with inflammation and edema in the left first MPJ with a small nodule on the left interphalangeal joint left toe that's non-painful and on the right foot I noted discomfort in the metatarsal shaft fifth metatarsal    Assessment:     Inflammatory capsulitis left first MPJ and possible fracture of the right fifth metatarsal versus inflammation    Plan:     Reviewed both conditions and today I reviewed x-rays. I injected the left first MPJ 3 mg Kenalog 5 mg Xylocaine and discussed as best as they can to reduce pressure into a walk barefoot on the right foot due to fracture. Also debrided painful nailbeds 1-5 both feet that they can not take care of with thick underlying tissue and [place]-term history of diabetes with nailbeds been painful

## 2014-09-17 NOTE — Patient Instructions (Signed)
Gout Gout is an inflammatory arthritis caused by a buildup of uric acid crystals in the joints. Uric acid is a chemical that is normally present in the blood. When the level of uric acid in the blood is too high it can form crystals that deposit in your joints and tissues. This causes joint redness, soreness, and swelling (inflammation). Repeat attacks are common. Over time, uric acid crystals can form into masses (tophi) near a joint, destroying bone and causing disfigurement. Gout is treatable and often preventable. CAUSES  The disease begins with elevated levels of uric acid in the blood. Uric acid is produced by your body when it breaks down a naturally found substance called purines. Certain foods you eat, such as meats and fish, contain high amounts of purines. Causes of an elevated uric acid level include:  Being passed down from parent to child (heredity).  Diseases that cause increased uric acid production (such as obesity, psoriasis, and certain cancers).  Excessive alcohol use.  Diet, especially diets rich in meat and seafood.  Medicines, including certain cancer-fighting medicines (chemotherapy), water pills (diuretics), and aspirin.  Chronic kidney disease. The kidneys are no longer able to remove uric acid well.  Problems with metabolism. Conditions strongly associated with gout include:  Obesity.  High blood pressure.  High cholesterol.  Diabetes. Not everyone with elevated uric acid levels gets gout. It is not understood why some people get gout and others do not. Surgery, joint injury, and eating too much of certain foods are some of the factors that can lead to gout attacks. SYMPTOMS   An attack of gout comes on quickly. It causes intense pain with redness, swelling, and warmth in a joint.  Fever can occur.  Often, only one joint is involved. Certain joints are more commonly involved:  Base of the big toe.  Knee.  Ankle.  Wrist.  Finger. Without  treatment, an attack usually goes away in a few days to weeks. Between attacks, you usually will not have symptoms, which is different from many other forms of arthritis. DIAGNOSIS  Your caregiver will suspect gout based on your symptoms and exam. In some cases, tests may be recommended. The tests may include:  Blood tests.  Urine tests.  X-rays.  Joint fluid exam. This exam requires a needle to remove fluid from the joint (arthrocentesis). Using a microscope, gout is confirmed when uric acid crystals are seen in the joint fluid. TREATMENT  There are two phases to gout treatment: treating the sudden onset (acute) attack and preventing attacks (prophylaxis).  Treatment of an Acute Attack.  Medicines are used. These include anti-inflammatory medicines or steroid medicines.  An injection of steroid medicine into the affected joint is sometimes necessary.  The painful joint is rested. Movement can worsen the arthritis.  You may use warm or cold treatments on painful joints, depending which works best for you.  Treatment to Prevent Attacks.  If you suffer from frequent gout attacks, your caregiver may advise preventive medicine. These medicines are started after the acute attack subsides. These medicines either help your kidneys eliminate uric acid from your body or decrease your uric acid production. You may need to stay on these medicines for a very [place] time.  The early phase of treatment with preventive medicine can be associated with an increase in acute gout attacks. For this reason, during the first few months of treatment, your caregiver may also advise you to take medicines usually used for acute gout treatment. Be sure you   understand your caregiver's directions. Your caregiver may make several adjustments to your medicine dose before these medicines are effective.  Discuss dietary treatment with your caregiver or dietitian. Alcohol and drinks high in sugar and fructose and foods  such as meat, poultry, and seafood can increase uric acid levels. Your caregiver or dietitian can advise you on drinks and foods that should be limited. HOME CARE INSTRUCTIONS   Do not take aspirin to relieve pain. This raises uric acid levels.  Only take over-the-counter or prescription medicines for pain, discomfort, or fever as directed by your caregiver.  Rest the joint as much as possible. When in bed, keep sheets and blankets off painful areas.  Keep the affected joint raised (elevated).  Apply warm or cold treatments to painful joints. Use of warm or cold treatments depends on which works best for you.  Use crutches if the painful joint is in your leg.  Drink enough fluids to keep your urine clear or pale yellow. This helps your body get rid of uric acid. Limit alcohol, sugary drinks, and fructose drinks.  Follow your dietary instructions. Pay careful attention to the amount of protein you eat. Your daily diet should emphasize fruits, vegetables, whole grains, and fat-free or low-fat milk products. Discuss the use of coffee, vitamin C, and cherries with your caregiver or dietitian. These may be helpful in lowering uric acid levels.  Maintain a healthy body weight. SEEK MEDICAL CARE IF:   You develop diarrhea, vomiting, or any side effects from medicines.  You do not feel better in 24 hours, or you are getting worse. SEEK IMMEDIATE MEDICAL CARE IF:   Your joint becomes suddenly more tender, and you have chills or a fever. MAKE SURE YOU:   Understand these instructions.  Will watch your condition.  Will get help right away if you are not doing well or get worse. Document Released: 11/16/2000 Document Revised: 04/05/2014 Document Reviewed: 07/02/2012 [facility] Patient Information 2015 [place], [place]. This information is not intended to replace advice given to you by your health care provider. Make sure you discuss any questions you have with your health care  provider. Diabetes and Foot Care Diabetes may cause you to have problems because of poor blood supply (circulation) to your feet and legs. This may cause the skin on your feet to become thinner, break easier, and heal more slowly. Your skin may become dry, and the skin may peel and crack. You may also have nerve damage in your legs and feet causing decreased feeling in them. You may not notice minor injuries to your feet that could lead to infections or more serious problems. Taking care of your feet is one of the most important things you can do for yourself.  HOME CARE INSTRUCTIONS  Wear shoes at all times, even in the house. Do not go barefoot. Bare feet are easily injured.  Check your feet daily for blisters, cuts, and redness. If you cannot see the bottom of your feet, use a mirror or ask someone for help.  Wash your feet with warm water (do not use hot water) and mild soap. Then pat your feet and the areas between your toes until they are completely dry. Do not soak your feet as this can dry your skin.  Apply a moisturizing lotion or petroleum jelly (that does not contain alcohol and is unscented) to the skin on your feet and to dry, brittle toenails. Do not apply lotion between your toes.  Trim your toenails straight across.  Do not dig under them or around the cuticle. File the edges of your nails with an emery board or nail file.  Do not cut corns or calluses or try to remove them with medicine.  Wear clean socks or stockings every day. Make sure they are not too tight. Do not wear knee-high stockings since they may decrease blood flow to your legs.  Wear shoes that fit properly and have enough cushioning. To break in new shoes, wear them for just a few hours a day. This prevents you from injuring your feet. Always look in your shoes before you put them on to be sure there are no objects inside.  Do not cross your legs. This may decrease the blood flow to your feet.  If you find a  minor scrape, cut, or break in the skin on your feet, keep it and the skin around it clean and dry. These areas may be cleansed with mild soap and water. Do not cleanse the area with peroxide, alcohol, or iodine.  When you remove an adhesive bandage, be sure not to damage the skin around it.  If you have a wound, look at it several times a day to make sure it is healing.  Do not use heating pads or hot water bottles. They may burn your skin. If you have lost feeling in your feet or legs, you may not know it is happening until it is too late.  Make sure your health care provider performs a complete foot exam at least annually or more often if you have foot problems. Report any cuts, sores, or bruises to your health care provider immediately. SEEK MEDICAL CARE IF:   You have an injury that is not healing.  You have cuts or breaks in the skin.  You have an ingrown nail.  You notice redness on your legs or feet.  You feel burning or tingling in your legs or feet.  You have pain or cramps in your legs and feet.  Your legs or feet are numb.  Your feet always feel cold. SEEK IMMEDIATE MEDICAL CARE IF:   There is increasing redness, swelling, or pain in or around a wound.  There is a red line that goes up your leg.  Pus is coming from a wound.  You develop a fever or as directed by your health care provider.  You notice a bad smell coming from an ulcer or wound. Document Released: 11/16/2000 Document Revised: 07/22/2013 Document Reviewed: 04/28/2013 [facility] Patient Information 2015 [place], [place]. This information is not intended to replace advice given to you by your health care provider. Make sure you discuss any questions you have with your health care provider.

## 2014-12-09 ENCOUNTER — Telehealth: Payer: Self-pay | Admitting: *Deleted

## 2014-12-09 NOTE — Telephone Encounter (Signed)
CALLED LEFT MESSAGE WITH [name] AT [phone]. I RETURNED HER CALL REGARDING THE STATUS OF DIABETIC SHOES. ACCORDING TO DR REGAL LAST NOTE, NO MENTION OF DIABETIC SHOES.

## 2015-01-02 ENCOUNTER — Emergency Department: Payer: Self-pay | Admitting: Physician Assistant

## 2015-01-05 LAB — BETA STREP CULTURE(ARMC)

## 2015-01-21 ENCOUNTER — Telehealth: Payer: Self-pay | Admitting: *Deleted

## 2015-01-21 NOTE — Telephone Encounter (Signed)
Pt states she has a black spot on her right foot, and a big red toe on the left foot, and she wanted a earlier appt than 01/28/2015.  I referred pt to the ER in her area as soon as possible, and to follow up with Dr. Milinda Pointer.  Pt agreed and I transferred her to schedulers.

## 2015-01-22 ENCOUNTER — Emergency Department: Payer: Self-pay | Admitting: Emergency Medicine

## 2015-01-24 ENCOUNTER — Encounter: Payer: Self-pay | Admitting: Podiatry

## 2015-01-24 ENCOUNTER — Ambulatory Visit (INDEPENDENT_AMBULATORY_CARE_PROVIDER_SITE_OTHER): Payer: Medicare Other | Admitting: Podiatry

## 2015-01-24 DIAGNOSIS — L03032 Cellulitis of left toe: Secondary | ICD-10-CM | POA: Diagnosis not present

## 2015-01-24 DIAGNOSIS — L02612 Cutaneous abscess of left foot: Secondary | ICD-10-CM | POA: Diagnosis not present

## 2015-01-24 MED ORDER — MUPIROCIN 2 % EX OINT: TOPICAL_OINTMENT | CUTANEOUS | Status: DC

## 2015-01-24 MED ORDER — CEPHALEXIN 500 MG PO CAPS: 500.0000 mg | ORAL_CAPSULE | Freq: Four times a day (QID) | ORAL | Status: DC

## 2015-01-24 NOTE — Progress Notes (Signed)
She presents today after having visited the emergency room over the weekend. She was diagnosed with an abscess of the hallux left which was incised and drained. They were unable to obtain there antibiotics because the drug store was closed. They have left addressed since Saturday. She denies fever chills nausea vomiting muscle aches and pains.  Objective: Vital signs are stable she is alert and oriented 3 pulses are palpable left lower extremity small 3 mm stab wound distal to the IP joint of the hallux left on the medial side does not demonstrate purulence it is open and appears to be clean. There is no erythema edema cellulitis drainage or odor.  Assessment: Well-healing abscess was incised and drained in the emergency department left foot.  Plan: At this point I rewrote her prescription for her antibiotics as well as a prescription for Bactroban ointment. Discussed the proper soaking regimen and antibiotics. She was dispensed a Darco shoe and I will follow-up with her in 2 weeks which time a set of x-rays should be taken of the great toe left foot. We will also debride her nails that time.

## 2015-01-28 ENCOUNTER — Ambulatory Visit: Payer: Medicare Other | Admitting: Podiatry

## 2015-02-07 ENCOUNTER — Ambulatory Visit (INDEPENDENT_AMBULATORY_CARE_PROVIDER_SITE_OTHER): Payer: Medicare Other | Admitting: Podiatry

## 2015-02-07 ENCOUNTER — Encounter: Payer: Self-pay | Admitting: Podiatry

## 2015-02-07 ENCOUNTER — Ambulatory Visit (INDEPENDENT_AMBULATORY_CARE_PROVIDER_SITE_OTHER): Payer: Medicare Other

## 2015-02-07 DIAGNOSIS — L02612 Cutaneous abscess of left foot: Secondary | ICD-10-CM

## 2015-02-07 DIAGNOSIS — M79673 Pain in unspecified foot: Secondary | ICD-10-CM | POA: Diagnosis not present

## 2015-02-07 DIAGNOSIS — L03032 Cellulitis of left toe: Secondary | ICD-10-CM | POA: Diagnosis not present

## 2015-02-07 DIAGNOSIS — B351 Tinea unguium: Secondary | ICD-10-CM | POA: Diagnosis not present

## 2015-02-07 NOTE — Patient Instructions (Signed)
Diabetes and Foot Care Diabetes may cause you to have problems because of poor blood supply (circulation) to your feet and legs. This may cause the skin on your feet to become thinner, break easier, and heal more slowly. Your skin may become dry, and the skin may peel and crack. You may also have nerve damage in your legs and feet causing decreased feeling in them. You may not notice minor injuries to your feet that could lead to infections or more serious problems. Taking care of your feet is one of the most important things you can do for yourself.  HOME CARE INSTRUCTIONS  Wear shoes at all times, even in the house. Do not go barefoot. Bare feet are easily injured.  Check your feet daily for blisters, cuts, and redness. If you cannot see the bottom of your feet, use a mirror or ask someone for help.  Wash your feet with warm water (do not use hot water) and mild soap. Then pat your feet and the areas between your toes until they are completely dry. Do not soak your feet as this can dry your skin.  Apply a moisturizing lotion or petroleum jelly (that does not contain alcohol and is unscented) to the skin on your feet and to dry, brittle toenails. Do not apply lotion between your toes.  Trim your toenails straight across. Do not dig under them or around the cuticle. File the edges of your nails with an emery board or nail file.  Do not cut corns or calluses or try to remove them with medicine.  Wear clean socks or stockings every day. Make sure they are not too tight. Do not wear knee-high stockings since they may decrease blood flow to your legs.  Wear shoes that fit properly and have enough cushioning. To break in new shoes, wear them for just a few hours a day. This prevents you from injuring your feet. Always look in your shoes before you put them on to be sure there are no objects inside.  Do not cross your legs. This may decrease the blood flow to your feet.  If you find a minor scrape,  cut, or break in the skin on your feet, keep it and the skin around it clean and dry. These areas may be cleansed with mild soap and water. Do not cleanse the area with peroxide, alcohol, or iodine.  When you remove an adhesive bandage, be sure not to damage the skin around it.  If you have a wound, look at it several times a day to make sure it is healing.  Do not use heating pads or hot water bottles. They may burn your skin. If you have lost feeling in your feet or legs, you may not know it is happening until it is too late.  Make sure your health care provider performs a complete foot exam at least annually or more often if you have foot problems. Report any cuts, sores, or bruises to your health care provider immediately. SEEK MEDICAL CARE IF:   You have an injury that is not healing.  You have cuts or breaks in the skin.  You have an ingrown nail.  You notice redness on your legs or feet.  You feel burning or tingling in your legs or feet.  You have pain or cramps in your legs and feet.  Your legs or feet are numb.  Your feet always feel cold. SEEK IMMEDIATE MEDICAL CARE IF:   There is increasing redness,   swelling, or pain in or around a wound.  There is a red line that goes up your leg.  Pus is coming from a wound.  You develop a fever or as directed by your health care provider.  You notice a bad smell coming from an ulcer or wound. Document Released: 11/16/2000 Document Revised: 07/22/2013 Document Reviewed: 04/28/2013 [employer] Patient Information 2015 [employer]. This information is not intended to replace advice given to you by your health care provider. Make sure you discuss any questions you have with your health care provider.  

## 2015-02-07 NOTE — Progress Notes (Signed)
She presents today for follow-up of an I and D hallux left. She states it still drains some. Her toenails are really starting to bother her she says.  Objective: Vital signs are stable she is alert and oriented 3. Hallux left demonstrates mild edema no erythema cellulitis drainage or odor. Nails are thick yellow dystrophic with mycotic and painful on palpation.  Assessment: Well-healing I and D hallux left. Pain in limb secondary onychomycosis 1 through 5.  Plan: Debridement reactive hyperkeratoses debridement nails 1 through 5 bilateral. Follow up with her as needed.

## 2015-03-23 ENCOUNTER — Inpatient Hospital Stay
Admit: 2015-03-23 | Disposition: A | Discharge status: Skilled Nursing Facility | Payer: Self-pay | Attending: Internal Medicine | Admitting: Internal Medicine

## 2015-03-23 LAB — CBC
HCT: 37.4 % (ref 35.0–47.0)
HGB: 12.2 g/dL (ref 12.0–16.0)
MCH: 29.4 pg (ref 26.0–34.0)
MCHC: 32.6 g/dL (ref 32.0–36.0)
MCV: 90 fL (ref 80–100)
Platelet: 277 10*3/uL (ref 150–440)
RBC: 4.15 10*6/uL (ref 3.80–5.20)
RDW: 15.1 % — ABNORMAL HIGH (ref 11.5–14.5)
WBC: 14.1 10*3/uL — ABNORMAL HIGH (ref 3.6–11.0)

## 2015-03-23 LAB — BASIC METABOLIC PANEL
Anion Gap: 12 (ref 7–16)
BUN: 50 mg/dL — ABNORMAL HIGH
Calcium, Total: 7.7 mg/dL — ABNORMAL LOW
Chloride: 89 mmol/L — ABNORMAL LOW
Co2: 28 mmol/L
Creatinine: 9.72 mg/dL — ABNORMAL HIGH
EGFR (African American): 5 — ABNORMAL LOW
EGFR (Non-African Amer.): 4 — ABNORMAL LOW
Glucose: 128 mg/dL — ABNORMAL HIGH
Potassium: 5 mmol/L
Sodium: 129 mmol/L — ABNORMAL LOW

## 2015-03-23 LAB — TROPONIN I
Troponin-I: 0.09 ng/mL — ABNORMAL HIGH
Troponin-I: 0.1 ng/mL — ABNORMAL HIGH

## 2015-03-23 LAB — CK-MB: CK-MB: 2.5 ng/mL

## 2015-03-24 LAB — TROPONIN I: Troponin-I: 0.11 ng/mL — ABNORMAL HIGH

## 2015-03-24 LAB — RENAL FUNCTION PANEL
Albumin: 2.9 g/dL — ABNORMAL LOW
Anion Gap: 14 (ref 7–16)
BUN: 61 mg/dL — ABNORMAL HIGH
Calcium, Total: 6.9 mg/dL — CL
Chloride: 89 mmol/L — ABNORMAL LOW
Co2: 25 mmol/L
Creatinine: 11.81 mg/dL — ABNORMAL HIGH
EGFR (African American): 4 — ABNORMAL LOW
EGFR (Non-African Amer.): 3 — ABNORMAL LOW
Glucose: 173 mg/dL — ABNORMAL HIGH
Phosphorus: 4.6 mg/dL
Potassium: 5.4 mmol/L — ABNORMAL HIGH
Sodium: 128 mmol/L — ABNORMAL LOW

## 2015-03-24 LAB — LIPID PANEL
Cholesterol: 154 mg/dL
HDL Cholesterol: 33 mg/dL — ABNORMAL LOW
Ldl Cholesterol, Calc: 90 mg/dL
Triglycerides: 155 mg/dL — ABNORMAL HIGH
VLDL Cholesterol, Calc: 31 mg/dL

## 2015-03-24 LAB — CBC WITH DIFFERENTIAL/PLATELET
Basophil #: 0.2 10*3/uL — ABNORMAL HIGH (ref 0.0–0.1)
Basophil %: 1 %
Eosinophil #: 0 10*3/uL (ref 0.0–0.7)
Eosinophil %: 0.2 %
HCT: 34.6 % — ABNORMAL LOW (ref 35.0–47.0)
HGB: 11.2 g/dL — ABNORMAL LOW (ref 12.0–16.0)
Lymphocyte #: 1.7 10*3/uL (ref 1.0–3.6)
Lymphocyte %: 10.7 %
MCH: 29.2 pg (ref 26.0–34.0)
MCHC: 32.3 g/dL (ref 32.0–36.0)
MCV: 90 fL (ref 80–100)
Monocyte #: 1.2 x10 3/mm — ABNORMAL HIGH (ref 0.2–0.9)
Monocyte %: 7.8 %
Neutrophil #: 12.8 10*3/uL — ABNORMAL HIGH (ref 1.4–6.5)
Neutrophil %: 80.3 %
Platelet: 262 10*3/uL (ref 150–440)
RBC: 3.82 10*6/uL (ref 3.80–5.20)
RDW: 14.6 % — ABNORMAL HIGH (ref 11.5–14.5)
WBC: 15.9 10*3/uL — ABNORMAL HIGH (ref 3.6–11.0)

## 2015-03-24 LAB — CK-MB
CK-MB: 2.2 ng/mL
CK-MB: 2.5 ng/mL

## 2015-03-24 LAB — BASIC METABOLIC PANEL
Anion Gap: 12 (ref 7–16)
BUN: 57 mg/dL — ABNORMAL HIGH
Calcium, Total: 7.1 mg/dL — ABNORMAL LOW
Chloride: 91 mmol/L — ABNORMAL LOW
Co2: 25 mmol/L
Creatinine: 10.93 mg/dL — ABNORMAL HIGH
EGFR (African American): 4 — ABNORMAL LOW
EGFR (Non-African Amer.): 3 — ABNORMAL LOW
Glucose: 118 mg/dL — ABNORMAL HIGH
Potassium: 4.8 mmol/L
Sodium: 128 mmol/L — ABNORMAL LOW

## 2015-03-24 LAB — HEMOGLOBIN A1C: Hemoglobin A1C: 6.4 % — ABNORMAL HIGH

## 2015-03-25 NOTE — H&P (Signed)
PATIENT NAMEREISA, Wang MR#:  [id number] DATE OF BIRTH:  1956/08/20  DATE OF ADMISSION:  03/09/2013  PRIMARY CARE PHYSICIAN:  Megan Salon. Chelminski, MD at [facility].   CHIEF COMPLAINT:  Shortness of breath.   HISTORY OF PRESENTING ILLNESS:  A 59 year old morbidly obese African American female patient with end-stage renal disease on hemodialysis Tuesday, Thursday, Saturday, hypertension, diabetes, sleep apnea who presents to the hospital complaining of progressively worsening shortness of breath of a week along with clear productive cough. The patient does not have any chest pain. The patient saw her ENT doctor 3 days back for some ear pain and was started on Augmentin for an ear infection. The patient today called EMS because of progressively worsening shortness of breath along with orthopnea and with significant respiratory distress needing BiPAP in the Emergency Room.   The patient has been compliant with her dialysis. Denies any excess salt or fluid intake. Chest x-ray shows mild pulmonary edema.   PAST MEDICAL HISTORY:   1.  End-stage renal disease on hemodialysis Tuesday, Thursday, Saturday.  2.  Hypertension.  3.  Dyslipidemia.  4.  Diabetes mellitus.  5.  Gout.  6.  Depression.  7.  Congestive heart failure, unknown diastolic or systolic.  8.  Sleep apnea on CPAP at night.  9.  Morbid obesity.  10.  Chronic back pain.  11.  Cholelithiasis.   PAST SURGICAL HISTORY:  Hysterectomy and left partial nephrectomy.   ALLERGIES:  CONTRAST DYE AND SHELLFISH.   SOCIAL HISTORY:  The patient does not smoke. No alcohol. No illicit drugs. Lives at home with her husband and grandson. Ambulates with a walker.   CODE STATUS:  FULL CODE.   FAMILY HISTORY:  Positive for diabetes.   HOME MEDICATIONS:   1.  Amitriptyline 25 mg oral once a day.  2.  Augmentin 875 mg 1 tablet oral 2 times a day.  3.  Aspirin 81 mg oral once a day.  4.  Cetirizine 10 mg oral once a day.  5.   Citalopram 40 mg oral once a day.  6.  Plavix 75 mg oral once a day.  7.  Colchicine 0.6 mg oral once a day.  8.  Docusate sodium 100 mg 2 capsules oral once a day.  9.  Ferrous sulfate 325 mg oral once a day.  10.  Flonase 1 spray nasal once a day.  11.  Lasix 80 mg 2 tablets oral 3 times a day.  12.  Guaifenesin 400 mg oral every 4 hours as needed for cough.  13.  Lantus 50 units subcutaneous once a day.  14.  Losartan 50 mg oral once a day for blood pressure.  15.  Lovaza 1000 mg oral 2 times a day.  16.  Omeprazole 20 mg oral once a day.  17.  ProAir 2 puffs inhaled every 8 hours as needed.  18.  Renvela 2.4 grams oral 3 times a day.  19.  Simvastatin 40 mg oral once a day.  20.  Vitamin D2 at 50,000 International Units oral 2 times a week.   REVIEW OF SYSTEMS:  CONSTITUTIONAL: Complains of fatigue and weakness. No weight loss, weight gain.  EYES: No blurred vision, pain, or redness.  ENT: No tinnitus. Has ear pain, no hearing loss.  RESPIRATORY: Has shortness of breath with productive sputum.  CARDIOVASCULAR: No chest pain, but has some mild edema and orthopnea.  GASTROINTESTINAL: No nausea, vomiting, diarrhea, abdominal pain.  GENITOURINARY: No hematuria,  makes minimal urine, is on dialysis because of end-stage renal disease.  HEMATOLOGIC: Has anemia of chronic disease and no bleeding or bruising.  SKIN: No rash or lesions.  NEUROLOGIC: No focal numbness, weakness, seizures.  ENDOCRINE: No polyuria, polydipsia, thyroid problems.   PHYSICAL EXAMINATION: VITAL SIGNS: Temperature 98.6, pulse of 93, blood pressure 143/70, saturating 97% on BiPAP.  GENERAL: Morbidly obese African American female patient lying in bed in significant respiratory distress on BiPAP.  PSYCHIATRIC: Alert and oriented x 3, restless, anxious.  HEENT: Atraumatic, normocephalic. Oral mucosa moist and pink. External ears and nose normal. Pallor positive. No icterus.  NECK: Supple. No thyromegaly. No palpable  lymph nodes. Trachea midline. No carotid bruit, JVD.  CARDIOVASCULAR: S1, S2, tachycardic without any murmurs, 1+ edema in the lower extremities.  RESPIRATORY: Increased work of breathing using accessory muscles, conversational dyspnea, presently on BiPAP. Has right lower lobe crackles.  GASTROINTESTINAL: Distended abdomen secondary to obesity, soft, nontender. Bowel sounds present. No hepatosplenomegaly palpable.  GENITOURINARY: No CVA tenderness or bladder distention.  SKIN: Warm and dry. No petechiae, rash, ulcers.  NEUROLOGIC: No focal weakness. Motor strength 5 out of 5 in upper and lower extremities. Sensation is intact all over.   DIAGNOSTIC DATA: . BNP is 3881. BUN is 38, creatinine 2.65, sodium 134, potassium 4.1, chloride 99. AST, ALT, alkaline phosphatase are normal. CK is 143. Troponin is 0.04. WBC is 6.7, hemoglobin 10.9, platelets of 263. INR is 1. ABG shows pH of 7.41 with pCO2 of 46. EKG shows normal sinus rhythm. No acute ST-T wave changes. Chest x-ray shows pulmonary edema.   ASSESSMENT AND PLAN:   1.  Acute on chronic congestive heart failure, unknown systolic or diastolic. We will check an echocardiogram. The patient will be given a STAT dose of intravenous Lasix 80 mg. We will consult nephrology for hemodialysis. We will see the response to Lasix. The intravenous dose can be repeated. We will continue her oral dose from home. Continue BiPAP support secondary to acute respiratory failure. The patient will be on a cardiac tele floor, fluid restriction and monitor ins and outs. We will have to rule out acute coronary syndrome secondary to worsening congestive heart failure, although the patient states she has been compliant with fluid restriction and her dialysis schedule. Cardiac enzymes are normal. EKG shows no acute changes.  2.  Hypertension. Continue medications from home and monitor.  3.  End-stage renal disease on hemodialysis. Consult nephrology for hemodialysis needs.  4.   Anemia of chronic disease, stable.  5.  Ear infection. Continue the Augmentin from home.  6.  Diabetes mellitus. Continue sliding scale insulin and home Lantus.  7.  Deep vein thrombosis prophylaxis with subcutaneous heparin.   CODE STATUS:  FULL CODE.   TIME SPENT TODAY ON THIS CRITICALLY ILL CASE WITH PULMONARY EDEMA, NEEDING BIPAP SUPPORT:  45 minutes.    ____________________________ Leia Alf Sudini, MD srs:si D: 03/09/2013 21:01:00 ET T: 03/09/2013 21:35:50 ET JOB#: [id number]  cc: Alveta Heimlich R. Sudini, MD, <Dictator> Megan Salon. Chelminski, MD Neita Carp MD ELECTRONICALLY SIGNED 03/16/2013 19:18

## 2015-03-25 NOTE — Discharge Summary (Signed)
PATIENT NAME:  [name], [name] MR#:  [id number] DATE OF BIRTH:  10-Jun-1956  DATE OF ADMISSION:  03/09/2013 DATE OF DISCHARGE:  03/11/2013  PRESENTING COMPLAINT: Shortness of breath.   DISCHARGE DIAGNOSES: 1.  Acute-on-chronic congestive heart failure; suspect diastolic.  2.  Hypertension.  3.  End-stage renal disease, on hemodialysis.  4.  Anemia of chronic disease.  5.  Ear infection.  6.  Type 2 diabetes.   Echo Doppler showed EF of 50% to 55%, impaired relaxation with LV diastolic filling.  The troponin is 0.06.   H and H are 9.6 and 28.3. Potassium is 4.0. B-type natriuretic peptide is 3881.   MEDICATIONS ON DISCHARGE: 1.  Amitriptyline 25 mg at bedtime.  2.  Cetirizine 10 mg daily.  2.  Citalopram 40 mg daily.  3.  Omeprazole 20 mg daily.  4.  Lovaza 1000 mg b.i.d.  5.  Iron sulfate 325 mg p.o. daily.  6.  Lasix 80 mg, 2 tablets 3 times a day.  7.  Guaifenesin 400 mg, 1 tablet every four hours as needed.  8.  Simvastatin 40 mg daily.  9.  Fluticasone nasal spray, once a day.  10.  Augmentin 1 tablet b.i.d. for 10 days.  11.  Renvela 800 mg 3 tablets 3 times a day.  12.  Aspirin 81 mg daily.  13.  Plavix 75 mg daily.  14. Vitamin D2, 50,000 b.i.d.  15.  Losartan 50 mg daily.  16.  Docusate 100 mg 2 capsules once a day.  17.  Colchicine 0.6 mg daily.  18.  Lantus 50 units once a day.   DIET: Renal diet, low-sodium, ADA 1800 calorie diet.   FOLLOWUP: With [facility] in 1 to 2 weeks.   Resume your hemodialysis Tuesday, Thursday, Saturday.   BRIEF SUMMARY OF HOSPITAL COURSE: The patient is a 59 year old African American female with a history of end-stage renal disease, on hemodialysis, along with a history of congestive heart failure and diabetes who comes in with:   1.  Acute-on-chronic congestive heart failure, diastolic: The patient was found to have elevated BNP, and x-ray showed pulmonary vascular congestion. She did report eating salty chips which was  likely contributing to her CHF and pulmonary edema. She was seen by Dr. Candiss Norse and underwent hemodialysis with ultrafiltration of about 3100 mL. The patient immediately felt better. Oxygen was continued. BiPAP was given as needed. She was back to baseline prior to discharge. She was advised on lifestyle and dietary compliance.  2.  Hypertension: Home meds were continued.  3.  End-stage renal disease: Patient did receive hemodialysis with ultrafiltration.  4.  Ear infection: The patient will finish up her Augmentin as outpatient.  5. Diabetes: Lantus was continued, along with sliding-scale.   Hospital stay otherwise remained stable.   CODE STATUS: The patient remained a FULL CODE.   TIME SPENT: Forty minutes.     ____________________________ Hart Rochester Posey Pronto, MD sap:dm D: 03/12/2013 07:43:51 ET T: 03/12/2013 08:40:51 ET JOB#: [id number]  cc: Sona A. Posey Pronto, MD, <Dictator> Ilda Basset MD ELECTRONICALLY SIGNED 03/23/2013 6:58

## 2015-03-26 NOTE — H&P (Signed)
PATIENT NAME:  [name], [name] MR#:  [id number] DATE OF BIRTH:  1956-04-18  DATE OF ADMISSION:  06/14/2014  PRIMARY CARE PHYSICIAN: [facility].   REFERRING EMERGENCY ROOM PHYSICIAN:  Dr. Jasmine December.   CHIEF COMPLAINT: Fever, short of breath.   HISTORY OF PRESENT ILLNESS: This is a 59 year old female with history of end-stage renal disease, on hemodialysis Tuesday, Thursday, Saturday at [facility]; hypertension, hyperlipidemia, diabetes, congestive heart failure, sleep apnea and morbid obesity, CPAP use at night, started having some fever for the last 3 days. She had her last dialysis last Saturday but she did not mention to dialysis center about her having a fever and her routine dialysis was done. Fever is getting worse and, as per husband, who was present in the room, they checked temperature, it was going up to 102 degrees Fahrenheit. She also started feeling some shortness of breath and had clear-looking sputum production so decided to come to Emergency Room. In the ER, she was noticed having high temperature, tachycardia and mild hypoxia. On chest x-ray there was infiltrate so she was given to hospitalist team as admission.   REVIEW OF SYSTEMS:    CONSTITUTIONAL: Positive for fever and fatigue. No weight loss or weight gain.  EYES: No blurring, double vision, discharge or redness.  EARS, NOSE, THROAT: No tinnitus, ear pain or hearing loss.  RESPIRATORY: No cough or wheezing but some shortness of breath and sputum production.  CARDIOVASCULAR: No chest pain, orthopnea, edema, arrhythmia, palpitations.  GASTROINTESTINAL: No nausea, vomiting, diarrhea, abdominal pain.  GENITOURINARY: No dysuria, hematuria or increased frequency.  ENDOCRINE: No heat or cold intolerance.  SKIN: No acne, rashes or lesions.  MUSCULOSKELETAL: No pain or swelling in the joints.  NEUROLOGICAL: No numbness, weakness, tremor or vertigo.  PSYCHIATRIC: No anxiety, insomnia, bipolar disorder.   PAST MEDICAL  HISTORY: 1.  End-stage renal disease, on hemodialysis Tuesday, Thursday, Saturday.  2.  Hypertension.  3.  Dyslipidemia.  4.  Diabetes.  5.  Gout.  6.  Depression.  7.  Congestive heart failure.  8.  Sleep apnea, on CPAP at night.  9.  Morbid obesity.  10.  Chronic back pain.  11.  Cholelithiasis.  PAST SURGICAL HISTORY:  Hysterectomy and left partial nephrectomy.   ALLERGIES:  CONTRAST DYE AND SHELLFISH.   SOCIAL HISTORY: Does not smoke. No alcohol. No illicit drug use. Lives at home with her husband and grandson, ambulates with a walker.   CODE STATUS: Full code.   FAMILY HISTORY: Positive for diabetes.   HOME MEDICATIONS:  1.  Vitamin D2 50,000 international units once a week.  2.  Simvastatin 40 mg oral once a day.  3.  Sensipar 30 mg oral tablet once a day.  4.  Renvela 800 mg oral tablet 3 times a day.  5.  Omeprazole 20 mg oral once a day.  6.  Metolazone 5 mg oral once a day.  7.  Lovaza 1000 mg oral capsule 2 times a day.  8.  Losartan 50 mg oral tablet once a day.  9.  Lantus 50 units subcutaneous once a day.  10.  Furosemide 80 mg oral 2 tablets 3 times a day.  11.  Fluticasone nasal spray 1 spray 2 times a day.  12.  Ferrous sulfate 325 mg oral tablet once a day.  13.  Docusate sodium 2 capsules once a day.  14.  Colchicine 0.6 mg oral tablet once a day.  15.  Citalopram 40 mg oral tablet once a day.  16.  Cetirizine 10 mg oral tablet once a day.  17.  Aspirin 81 mg once a day.  18.  Amitriptyline 25 mg oral tablet once a day.   PHYSICAL EXAMINATION: VITAL SIGNS: Temperature 102.3, pulse rate 112, respirations 29, blood pressure 156/85 and pulse oximetry 92% on room air.  GENERAL: The patient is obese, slightly distressed du to respiratory issue but alert and oriented, cooperative with history-taking and physical examination.  HEENT: Head and neck atraumatic. Conjunctivae pink. Oral mucosa moist.  NECK: Supple. No JVD.  RESPIRATORY: Bilateral equal air  entry. Some crepitation present.  CARDIOVASCULAR: S1, S2 present, regular. No murmur.  ABDOMEN: Soft, nontender. Bowel sounds present. No organomegaly.  SKIN: No rashes.  LEGS: Mild edema present.  NEUROLOGICAL: Power 5 out of 5. Follows commands. Moves all 4 limbs. No tremor.  JOINTS: No swelling or tenderness.  PSYCHIATRIC: Does not appear in any acute psychiatric illness at this time.  SKIN: No acne or rashes.   LABORATORY, DIAGNOSTIC AND RADIOLOGICAL DATA:   1.  Glucose 106, BUN 46, creatinine 11.12, sodium 128, potassium 4.9, chloride 89, CO2 is 28, calcium is 8.4 and magnesium 1.8. Total protein 8.3, albumin 3.0, bilirubin 0.5, alkaline phosphate 235, SGOT 13, SGPT 27.  2.  Troponin 0.03.  3.  WBC 12.1, hemoglobin 12.2, platelet count is 248, MCV is 92.  4.  INR is 1.  5.  Blood cultures collected.  6.  ABG, pH is 7.41, pCO2 is 41 and pO2 is 58 on 21% oxygen.  7.  X-ray of the chest, portable, is done which shows probable lingular infiltrate.   ASSESSMENT AND PLAN: A 59 year old female with multiple medical complaints on hemodialysis, hypertension and diabetes who came to hospital with fever and shortness of breath, found having lingular pneumonia on chest x-ray in the ER.  1.  Sepsis as evidenced by tachycardia, tachypnea, fever, raised WBC and hypoxia in ER. This is secondary to pneumonia. She was started on treatment by ER after collecting blood culture, on broad-spectrum antibiotic. Continue monitoring.  2.  Pneumonia. As the patient is on hemodialysis, we will consider her as immunocompromised patient and will start on broad-spectrum antibiotics of vancomycin, Zosyn and Levaquin, 1 dose already given by ER, will continue the same. Pharmacy to dose the medication.  3.  End-stage renal disease on hemodialysis. We will get nephrology consult to continue hemodialysis in the hospital.  4.  Diabetes. Currently, we will hold her Matus-acting insulin and will give insulin sliding scale  coverage.  5.  Hypertension. Because of presentation with sepsis, we will hold some of the medication but we will continue losartan and continue monitoring.  6.  Chronic anemia. We will continue iron oral supplementation.  7.  Hyperlipidemia. We will continue simvastatin.  8.  Deep vein thrombosis prophylaxis with heparin subcutaneous.  9.  CODE STATUS:  Full code.   TOTAL TIME SPENT ON THIS ADMISSION: 50 minutes.     ____________________________ Ceasar Lund Anselm Jungling, MD vgv:cs D: 06/14/2014 18:09:09 ET T: 06/14/2014 18:41:01 ET JOB#: [id number]  cc: Ceasar Lund. Anselm Jungling, MD, <Dictator> Vaughan Basta MD ELECTRONICALLY SIGNED 06/17/2014 12:54

## 2015-03-26 NOTE — Discharge Summary (Signed)
PATIENT NAME:  [name], [name] MR#:  [id number] DATE OF BIRTH:  Feb 19, 1956  DATE OF ADMISSION:  06/14/2014. DATE OF DISCHARGE:  06/16/2014.  PRESENTING COMPLAINT: Shortness of breath, cough and fever.   DISCHARGE DIAGNOSES:  1.  Sepsis secondary to left lingular pneumonia.  2.  End-stage renal disease on hemodialysis.  3.  Type 2 diabetes.  4.  Morbid obesity.  5.  Oxygen saturation 95% on 2 liters nasal cannula continuous.   CODE STATUS: Full code.   MEDICATIONS:  1.  Amitriptyline 25 mg at bedtime.  2.  Cetirizine 10 mg daily.  3.  Citalopram 40 mg daily.  4.  Omeprazole 20 mg daily.  5.  Ferrous sulfate 325 mg p.o. daily.  6.  Lasix 80 mg 2 tablets 3 times a day.  7.  Guaifenesin 400 mg 1 tablet every 4 hours as needed.  8.  Simvastatin 40 mg daily.  9.  Renvela 800 mg 3 tablets 3 times a day.  10. Aspirin 81 mg daily.  11. Losartan 50 mg daily.  12. Docusate 100 mg 2 capsules once a day.  13. Colcrys 0.6 mg 1 tablet daily.  14. Lantus 50 units subcutaneously daily.  15. Fluticasone 50 mcg per nasal spray b.i.d.  16. Vitamin D2, 50,000 one capsule once a week.  17. Omega-3 fatty acids 1 capsule b.i.d.  18. Metolazone 5 mg 1 tablet daily.  19. Sensipar 30 mg daily.  20. Levaquin 250 mg daily.   DISCHARGE INSTRUCTIONS:  1.  Carbohydrate-controlled diet renal diet.  2.  The patient is to resume dialysis as before.  3.  Follow up with PCP in 1 to 2 weeks.   SUMMARY OF LABORATORY DATA/IMAGING STUDIES:  1.  White count at discharge was 10.2.  2.  Blood cultures negative in 48 hours.  3.  Chest x-ray consistent with left lingular pneumonia.  4.  White count on admission was 12.1.   BRIEF SUMMARY OF HOSPITAL COURSE: Ms. Olbrich is a 59 year old obese African American female with history of end-stage renal disease on hemodialysis, hypertension, diabetes, comes to the Emergency Room with fever, shortness of breath and was admitted with:  1. Sepsis, as evidenced by tachycardia,  fever and elevated white count with hypoxia in the Emergency Room secondary to left lingula pneumonia. She was started on broad-spectrum antibiotics and improved gradually. She remained afebrile, blood cultures were negative. Her antibiotics were narrowed down to p.o. Levaquin. White count was normal on discharge.  2.  End-stage renal disease, dialysis was continued in the hospital.  3.  Type 2 diabetes. Resumed her Lantus and sliding scale.  4.  Hypertension. Blood pressure remained stable.  5.  Hyperlipidemia, on Simvastatin.   Overall, hospital stay otherwise remained stable. The patient was seen by physical therapy who recommended home PT, which has been arranged. She was recommended to continue using her home oxygen as before.   TIME SPENT: 40 minutes.    ____________________________ Hart Rochester Posey Pronto, MD sap:lt D: 06/17/2014 07:19:06 ET T: 06/17/2014 08:22:55 ET JOB#: [id number]  cc: Sona A. Posey Pronto, MD, <Dictator> Ilda Basset MD ELECTRONICALLY SIGNED 06/29/2014 15:35

## 2015-03-27 NOTE — H&P (Signed)
PATIENT NAMEADAYSIA, [name] MR#:  [id number] DATE OF BIRTH:  [birth year]  DATE OF ADMISSION:  03/24/2012  PRIMARY CARE PHYSICIAN: Coral Ceo, MD  REFERRING PHYSICIAN: Lurline Hare, MD  CHIEF COMPLAINT: Fever and cough.   HISTORY OF PRESENT ILLNESS: [name] is a 59 year old African American female with significant past medical history of end-stage renal disease on hemodialysis on Tuesday, Thursday, and Saturday, history of hypertension, dyslipidemia, diabetes, gout, depression, obstructive sleep apnea on CPAP, pickwickian syndrome, and morbid obesity who presents with fever and cough. The patient reports she had Carolyn hemodialysis on Saturday without any problems, but she reports she has been having cough and sore throat over the last couple of days. The patient reports [name] had these complaints before and [name] had these problems and she started to have these problems on Saturday where she has been complaining of cough, nonproductive, and been having sore throat, feeling weak, lethargic, tired, and she has been febrile at 100.6 at home. In the ED, upon presentation, the patient had low saturation of 91%. The patient has chronic respiratory failure and is dependent on oxygen at home, on 2 liters via nasal cannula. In the ED, the patient had poor quality chest x-ray with poor inspiratory effort, but it appears there is significant vascular congestion and volume overload and infiltrate or opacity could not be ruled out. The patient was given IV Rocephin and Zithromax for what is presumed to be acute bronchitis at this point.   PAST MEDICAL HISTORY:  1. End-stage renal disease on hemodialysis Saturday, Tuesday, and Thursday.  2. Hypertension.  3. Dyslipidemia.  4. Diabetes.  5. Gout.  6. Depression.  7. Gastroesophageal reflux disease.  8. Obstructive sleep apnea, on CPAP.  9. Morbid obesity.  10. Chronic back pain.  11. History of cholelithiasis.  PAST SURGICAL HISTORY:   1. Status post partial hysterectomy.  2. Status post left nephrectomy.   CURRENT MEDICATIONS:  1. Amitriptyline 25 mg at bedtime.  2. Aspirin 81 mg daily.  3. Calcium carbonate 1250 mg oral three times daily. 4. Cetirizine 10 mg oral daily.  5. Citalopram 40 mg oral daily.  6. Clopidogrel 75 mg oral daily.  7. Colace 100 mg oral twice a day. 8. Colchicine 0.6 mg oral three times daily. 9. Docusate 100 mg oral daily.  10. Drisdol 50,000 international unit capsule weekly. 11. Enalapril 10 mg oral daily.  12. Ferrous sulfate 325 mg oral daily.  13. Flonase 50 mcg inhalation daily.  14. Lasix 80 mg oral three times daily.  15. Gabapentin 600 mg oral daily.  16. Hydrocodone/acetaminophen 7.5/325 mg 1 to 2 tabs orally every six hours.  17. Lantus 70 units subcutaneous daily.  18. Lovaza 1000 mg oral twice a day. 19. Metolazone 5 mg oral daily.  20. MiraLax as needed.  21. Omeprazole 20 mg oral daily.  22. Renagel 800 mg three tablets three times daily. 23. Simvastatin 40 mg oral at bedtime.  ALLERGIES: The patient is allergic to contrast dye and shellfish.   SOCIAL HISTORY: Negative for tobacco, alcohol, or illicit drugs. The patient lives with [name].   FAMILY HISTORY: Significant for diabetes.   REVIEW OF SYSTEMS: CONSTITUTIONAL: Significant for fever, fatigue, and weakness. EYES: Denies any blurry vision, double vision, pain, or redness. ENT: Denies any tinnitus. Has left ear pain. Denies any hearing loss, allergies, or epistaxis. RESPIRATORY: Complains of cough, nonproductive. Has baseline shortness of breath now worsened and on 2 liters nasal cannula at home.  CARDIOVASCULAR: Denies any chest pain, orthopnea, edema, palpitations, or syncope. GASTROINTESTINAL: Denies any nausea, vomiting, diarrhea, or abdominal pain. GENITOURINARY: Denies any dysuria, hematuria, or renal colic. ENDOCRINE: Has reported increased oral fluid intake recently. Denies any heat or cold intolerance.  HEMATOLOGY: Denies any easy bruising, bleeding, diathesis, or blood clots. MUSCULOSKELETAL: Has history of gout. Denies any joint swelling or arthritis. NEURO: Denies any numbness, dysarthria, epilepsy, or tremors. PSYCH: Denies any insomnia, anxiety, or drug or alcohol abuse.   PHYSICAL EXAMINATION:   VITAL SIGNS: Temperature 99.9, pulse 103, respiratory rate 26, blood pressure 148/69, and saturating 99% on room air.   GENERAL: Morbidly obese female who looks comfortable in bed, in no apparent distress.   HEENT: Head atraumatic, normocephalic. Pupils equal and reactive to light. Pink conjunctiva. Anicteric sclerae. Moist oral mucosa. Has left external ear canal irritation.  NECK: No thyromegaly. No bruits.   CARDIOVASCULAR: Regular rate and rhythm. No murmur, rub or gallop. PMI is nondisplaced.  LUNGS: Clear to auscultation bilaterally. No wheezing, rales, or rhonchi.   ABDOMEN: Soft, obese, nontender, and nondistended. Bowel sounds present.   EXTREMITIES: Mild pitting edema.   NEUROLOGIC: Awake and alert x3. Cranial nerves grossly intact. Strength 5/5. Reflexes +2.  PSYCHIATRIC: Appropriate affect, awake and alert x3. Intact judgment and insight. Not anxious or depressed.   MUSCULOSKELETAL: No erythema or swelling.   PERTINENT LABS: Glucose 166, BUN 38, creatinine 9.88, sodium 131, potassium 4.1, chloride 92, CO2 27, and anion gap 12. White blood cell 5.8, hemoglobin 10.5, hematocrit 31.6, and platelets 235.   ASSESSMENT AND PLAN:  1. Fever and cough: This is most likely secondary to acute bronchitis versus upper airway infection. I could not appreciate any opacity or infiltrate on the x-ray secondary to x-ray poor quality so diagnosis of pneumonia can still be considered. We will start the patient on antibiotics, IV Rocephin and azithromycin, and we will have the patient on nebulizers and continue Carolyn on oxygen 2 liters via nasal cannula.  2. End-stage renal disease: Even though the  patient has evidence of vascular congestion on Carolyn chest x-ray, she does not appear to be in any respiratory distress at this point. We will have  Carolyn hemodialysis to be done on Tuesday as scheduled.  3. Diabetes: We will continue the patient on Lantus 50 units daily and we will start Carolyn on sliding scale.  4. Gout: We will continue colchicine at home dose.  5. Hypertension: We will continue the patient on Lasix and metolazone.  6. Obstructive sleep apnea: We will continue CPAP at bedtime.  7. The patient will be started on GI prophylaxis and deep vein thrombosis prophylaxis.   CODE STATUS: THE PATIENT IS FULL CODE.   TIME SPENT ON PATIENT CARE: 50 minutes. ____________________________ Albertine Patricia, MD dse:slb D: 03/24/2012 07:34:43 ET T: 03/24/2012 07:56:11 ET JOB#: [id number]  cc: Albertine Patricia, MD, <Dictator> Coral Ceo, MD DAWOOD Graciela Husbands MD ELECTRONICALLY SIGNED 03/25/2012 16:37

## 2015-03-27 NOTE — Discharge Summary (Signed)
PATIENT NAME:  [name], [name] MR#:  [id number] DATE OF BIRTH:  17-Oct-1956  DATE OF ADMISSION:  12/10/2011 DATE OF DISCHARGE:  12/12/2011  PRIMARY CARE PHYSICIAN:  Megan Salon. Chelminski, MD  DIALYSIS: [facility]  FINAL DIAGNOSES:  1. Chest pain and borderline troponin.  2. Malignant hypertension.  3. Obesity and sleep apnea, probable pickwickian syndrome.  4. Diabetes.  5. End-stage renal disease.  6. Hyperlipidemia.  7. Gout.  8. Gastroesophageal reflux disease.  9. History of cerebrovascular accident.  10. History of congestive heart failure and fluid overload with acute and probably chronic respiratory failure.   MEDICATIONS ON DISCHARGE:  1. Colchicine 0.6 mg 3 times a week. 2. Metolazone 5 mg daily.  3. Aspirin 81 mg daily.  4. Amitriptyline 25 mg at bedtime.  5. Zyrtec 10 mg daily.  6. Celexa 40 mg daily.  7. Fluocinonide 0.05 twice a day.  8. Gabapentin 600 mg 3 times a day. 9. MiraLax as needed for constipation.  10. Omeprazole 20 mg daily.  11. Simvastatin 40 mg at bedtime.  12. Hydrocodone acetaminophen 7.5 and 325, 1 to 2 tablets p.r.n. pain.  13. Vitamin D 50,000 units once a week. 14. Flonase 50 mcg nasal spray one spray daily 15. Plavix 75 mg daily.  16. Calcium carbonate 1250 mg 3 times a day.  17. Lasix 80 mg 3 times a day.  18. Colace 100 mg daily.  19. Lantus 50 units subcutaneous injection daily, use after dialysis. 20. Renvela 800 mg 3 tablets prior to meals.  21. Enalapril 10 mg daily.  22. Oxygen 2 liters nasal cannula.   DIET: Low sodium diet 1800 ADA diet.   ACTIVITY: Activity as tolerated.   FOLLOW-UP: Follow-up with dialysis on Tuesday, Thursday, Saturday. Follow-up with Dr. Janalyn Rouse in 1 to 2 weeks.   REASON FOR ADMISSION: The patient was admitted 12/10/2011 for observation with chest pain.   HISTORY OF PRESENT ILLNESS: The patient is a 59 year old female with end-stage renal disease on dialysis who came in with  substernal chest pain that comes and goes, pressure like, numbness in the right aspect of her face. Troponin was slightly elevated in the Emergency Room. She has chronic shortness of breath which is unchanged. She was admitted for an observation. A stress test was ordered.   LABORATORY, DIAGNOSTIC, AND RADIOLOGICAL DATA: EKG showed a normal sinus rhythm, 90 beats per minute, septal infarct. Chest x-ray - cannot exclude atelectasis versus infiltrate at the left lung base. The study is quite limited secondary to study.  Troponin 0.09. TSH 1.12. White blood cell count 6.8, hemoglobin and hematocrit 10.8 and 32.3, platelet count 278, glucose 89, BUN 61, creatinine 10.94, sodium 140, potassium 4.9, chloride 99, CO2 28, calcium 8.7. Liver function tests normal range. Albumin low at 3.2. CT scan of the head showed no acute intracranial abnormality. LDL 75, HDL 31, triglycerides 338, hemoglobin A1c 8.2. Hepatitis B surface antigen negative. Parathyroid hormone 139. Phosphorus 5.2. Stress test read by Dr. Clayborn Bigness was negative. Ejection fraction 63% with normal wall motion.   HOSPITAL COURSE PER PROBLEM LIST:  1. For chest pain and borderline troponin, the patient was seen in consultation by Dr. Clayborn Bigness. A stress test was done which was negative. The patient was chest pain-free, unclear if the chest pain could be musculoskeletal.  2. For the patient's malignant hypertension, all blood pressure did come down on its own. The patient was given low dose enalapril along with her Lasix to control blood pressure. Blood  pressure upon discharge was 138/67.  3. For obesity and sleep apnea, most likely pickwickian syndrome, the patient had CPAP at night. Weight loss recommended. After sleeping without the CPAP on, her pulse oximetry was on the lower side. She was walked around. Pulse oximetry dropped down to 88% so the patient was a candidate for home oxygen 24/7. This was set up via the care manager. 4. For her chronic  respiratory failure, could be secondary to fluid status with history of congestive heart failure and fluid overload. 5. For diabetes, Lantus 50 units after dialysis was given. Hemoglobin A1c was 8.2. Sugars should be better. Further titration as outpatient.  6. For end-stage renal disease, she was dialyzed here in the hospital after her stress test and will continue dialysis upon discharge. 7. For her hyperlipidemia, hold on simvastatin. Can consider treatment for triglycerides as outpatient.  8. For her gout, she is on colchicine.  9. For her gastroesophageal reflux disease, she is on omeprazole.  10. For her history of cerebrovascular accident, she is on Plavix.  11. For history of heart failure and fluid overload, dialysis to help out with fluid. Oxygen should help out with exercise capacity. She is on high dose Lasix and Metolazone.   TIME SPENT: Time spent on discharge was 40 minutes.    ____________________________ Tana Conch. Leslye Peer, MD rjw:rbg D: 12/13/2011 12:14:46 ET T: 12/14/2011 14:26:55 ET JOB#: [id number]  cc: Tana Conch. Leslye Peer, MD, <Dictator> Megan Salon. Chelminski, MD Marisue Brooklyn MD ELECTRONICALLY SIGNED 12/23/2011 13:35

## 2015-03-27 NOTE — Discharge Summary (Signed)
PATIENT NAME:  [name], [name] MR#:  [id number] DATE OF BIRTH:  10/05/1956  DATE OF ADMISSION:  03/24/2012 DATE OF DISCHARGE:  03/29/2012  PRIMARY CARE PHYSICIAN:  Dr. Eddie Dibbles Chelminski.   CONSULTATION: Nephrology, Dr. Candiss Norse.   DISCHARGE DIAGNOSES:  1. Systemic inflammatory response syndrome.  2. Pneumonia.  3. End stage renal disease.  4. Diabetes.  5. Hypertension.  6. Obstructive sleep apnea.    HOME MEDICATIONS: Refer to the home medication list in Discharge Instructions.   ADDITIONAL MEDICATIONS: Zithromax 500 mg p.o. daily for five days.   PHYSICIAN INSTRUCTIONS:  1. Continue home oxygen by nasal cannula. 2. CPAP p.r.n.   DIET: Low sodium ADA 1,800 calorie diet.   ACTIVITY: As tolerated.   FOLLOW-UP CARE:  1. Follow-up with primary care physician within one week.  2. Follow-up with Dr. Candiss Norse, nephrology within one week.   HOSPITAL COURSE:  1. The patient is a 18 old [place] American female with history of ESRD, on dialysis, hypertension, diabetes, obstructive sleep apnea on CPAP, morbid obesity who presented to the ED with fever, cough and sore throat. The patient also complained of weakness and lethargy. She was noted to have a fever of 100.6 at home. Oxygen saturation was low at 91 in ED. Chest x-ray showed poor inspiration effort with significant vascular congestion and volume overload and infiltrate. The patient has been treated with Rocephin and Zithromax for pneumonia.  2. In addition, for ESRD the patient continued on hemodialysis.  3. Diabetes. The patient has been treated with Lantus as well as sliding scale. 4. Hypertension, is controlled with Lasix and metolazone.  5. Obstructive sleep apnea. The patient continued the CPAP at bedtime during hospitalization.   After the above- mentioned treatment, the patient's symptoms have improved. She only has a mild cough. No shortness of breath or sputum. The patient is clinically stable and will be discharged to home  today. I discussed the discharge plan with the patient and the nurse.   TIME SPENT: About 35 minutes.    ____________________________ Demetrios Loll, MD qc:ap D: 03/29/2012 10:50:33 ET T: 03/29/2012 14:46:45 ET JOB#: [id number]  cc: Demetrios Loll, MD, <Dictator> Megan Salon. Chelminski, MD Demetrios Loll MD ELECTRONICALLY SIGNED 03/29/2012 16:07

## 2015-03-27 NOTE — H&P (Signed)
PATIENT NAMEKEIYANA, [name] MR#:  [id number] DATE OF BIRTH:  Sep 20, 1956  DATE OF ADMISSION:  12/10/2011  PRIMARY CARE PHYSICIAN: Dr. Eddie Dibbles Chelminski.  NEPHROLOGIST: Dr. Candiss Norse. ER REFERRING PHYSICIAN: Dr. Jasmine December.   CHIEF COMPLAINT: Chest pain.   HISTORY OF PRESENT ILLNESS: The patient is a 59 year old African American female with end-stage renal disease who is on dialysis. The patient reports that she started having substernal chest pain since yesterday. She reports that it comes and goes, describes it as a pressure-like sensation. She also subsequently started having numbness in the right lower aspect of her face. Came to the ED, had cardiac enzymes. Troponin was slightly elevated at 0.07. She also has chronic shortness of breath that is unchanged. She denies any or radiation of the pain to her arms or neck. Denies any burning sensation in her chest. She, otherwise, denies any fevers or chills. No abdominal pain, nausea, vomiting, or diarrhea.   PAST MEDICAL HISTORY:  1. End-stage renal disease on hemodialysis on Saturday, Tuesday, Thursday. 2. Hypertension. 3. Dyslipidemia. 4. Diabetes. 5. Gout. 6. Depression. 7. Gastroesophageal reflux disease. 8. Obstructive sleep apnea on CPAP.  9. Morbid obesity.  10. Chronic back pain.  11. History of cholelithiasis which she states is now resolved.   PAST SURGICAL HISTORY:  1. Status post partial hysterectomy.  2. Status post left nephrectomy.   CURRENT MEDICATIONS:  1. Lantus 75 at bedtime.  2. Renagel 800 milligrams three tabs t.i.d.  3. Colchicine 0.6, one tab p.o. 3 times a week on dialysis days.  4. Metolazone 5 mg daily.  5. Aspirin 81, one tab p.o. daily.  6. Amitriptyline 25 at bedtime.  7. Celexa 40 daily.  8. Sertraline 10 daily.  9. Gabapentin 600, one tab p.o. b.i.d.  10. She is on MiraLAX daily as needed.  11. Omeprazole 20 daily.  12. Simvastatin 40 daily.  13. Hydrocodone/Tylenol 1 to 2 tabs p.r.n.  14. Drisdol  50,000 units once a week.  15. Flonase 50 mcg INH daily. 16. Plavix 75 p.o. daily.  17. Calcium 1,250, one tab p.o. t.i.d.  18. Lasix 80, one tab p.o. 3 times daily.  19. Docusate 100, one tab p.o. daily p.r.n.   ALLERGIES: The patient is allergic to contrast dye and shellfish.   SOCIAL HISTORY: Negative for tobacco, alcohol, or illicit drugs. She uses a four-prong walker. She lives with her husband.   FAMILY HISTORY: Significant for diabetes.   REVIEW OF SYSTEMS: CONSTITUTIONAL: Denies any fevers. Complains of fatigue, weakness, chest pain as above. No weight loss. No weight gain. EYES: No blurred or double vision. No pain. No redness. No inflammation. No glaucoma. ENT: No tinnitus. No ear pain. No hearing loss. No seasonal or year-round allergies. No difficulty with swallowing. RESPIRATORY: No cough. No wheezing. No hemoptysis. Has chronic obstructive pulmonary disease. No tuberculosis. No pneumonia. CARDIOVASCULAR: Has chest pain as above. Denies orthopnea, has edema. No arrhythmias. No syncope. GASTROINTESTINAL: No nausea, vomiting, diarrhea. No abdominal pain. No hematemesis. GU: Denies any dysuria, hematuria, renal calculus, or frequency. ENDOCRINE: Denies any polydipsia, nocturia, or thyroid problems. No increase in sweating, heat or cold intolerance. HEME/LYMPH: Denies any anemia, easy bruisability or bleeding. MUSCULOSKELETAL: Denies any pain in the neck, back, or shoulder. NEUROLOGIC: Complains of numbness in the right lower aspect of her face. No cerebrovascular accident. No transient ischemic attack. No seizures. PSYCHIATRIC: No anxiety. No insomnia. No ADD. No OCD. No bipolar or depression.   PHYSICAL EXAMINATION:  VITAL SIGNS: Temperature 98.6, pulse  90, respirations 20, blood pressure 157/91, O2 94% on 2 liters.   GENERAL: The patient is a morbidly obese African American female in no acute distress.   HEENT: Head atraumatic, normocephalic. Pupils are equal, round, reactive to light  and accommodation. Extraocular movements intact. Oropharynx is clear without any exudate.   NECK: There is no thyromegaly. No carotid bruits.   CARDIOVASCULAR: Regular rate and rhythm. No murmurs, rubs, clicks, or gallops. PMI is not displaced.   LUNGS: Clear to auscultation bilaterally without any rales, rhonchi, or wheezing.   ABDOMEN: Soft, nontender, nondistended. Positive bowel sounds x4.   EXTREMITIES: She has 1+ edema.   NEUROLOGIC: Awake, alert, oriented x3. No focal deficits. Cranial nerves 2 through 12 grossly intact. Strength five out of five. Reflexes 2+.   PSYCHIATRIC: Not anxious or depressed.   VASCULAR: Good DP, PT pulses.   LYMPHATICS: No lymph nodes palpable.   MUSCULOSKELETAL: There is no erythema or swelling.   LABORATORY, RADIOLOGICAL AND DIAGNOSTIC DATA: CT scan of the head which showed no evidence of ischemia, decreased density in the deep white matter consistent with chronic small vessel changes. EKG shows normal sinus rhythm with septal infarct, age undetermined. CPK 167, CK-MB 2.6, glucose 89, BUN 61, creatinine 10.94, sodium 140, potassium 4.9. LFTs were normal. Albumin 3.2. TSH 1.12. Troponin 0.09. Chest x-ray shows cannot exclude atelectasis or infiltrate at the left lung base. Echocardiogram done recently on 08/23 showed normal LVF, LVH, mild mitral regurgitation and tricuspid regurgitation.   ASSESSMENT AND PLAN: The patient is a 59 year old African American female with end-stage renal disease who has been having on and off chest pain since yesterday.  1. Chest pain, elevated cardiac enzymes, which is slightly elevated, in an end-stage renal disease. This is not clinically significant, however, she does have risk factors for having coronary artery disease. We will go ahead and admit the patient. Check enzymes overnight. If trends upward, cardiology consult. Otherwise, go ahead and do a Lexi MIBI in the a.m. Will give aspirin 325 mg daily, nitroglycerin p.r.n.   2. End-stage renal disease. Will require hemodialysis in the morning as scheduled.  3. Diabetes. Will continue Lantus and sliding scale.  4. Gout. Continue colchicine as taking at home.  5. Hypertension. The patient is on Lasix only for that which we will continue.  6. Obstructive sleep apnea. Continue CPAP at bedtime.  7. Miscellaneous. I will place her on heparin for deep vein thrombosis prophylaxis.   TIME SPENT: 35 minutes.  ____________________________ Lafonda Mosses Posey Pronto, MD shp:ap D: 12/10/2011 [id number] ET T: 12/11/2011 08:13:37 ET JOB#: [id number]  cc: Shreyang H. Posey Pronto, MD, <Dictator> Megan Salon. Chelminski, MD Alric Seton MD ELECTRONICALLY SIGNED 12/18/2011 14:23

## 2015-03-28 ENCOUNTER — Inpatient Hospital Stay
Admit: 2015-03-28 | Disposition: A | Discharge status: Skilled Nursing Facility | Payer: Self-pay | Attending: Internal Medicine | Admitting: Internal Medicine

## 2015-03-28 LAB — BASIC METABOLIC PANEL
Anion Gap: 16 (ref 7–16)
BUN: 46 mg/dL — ABNORMAL HIGH
Calcium, Total: 7.5 mg/dL — ABNORMAL LOW
Chloride: 87 mmol/L — ABNORMAL LOW
Co2: 28 mmol/L
Creatinine: 9.31 mg/dL — ABNORMAL HIGH
EGFR (African American): 5 — ABNORMAL LOW
EGFR (Non-African Amer.): 4 — ABNORMAL LOW
Glucose: 176 mg/dL — ABNORMAL HIGH
Potassium: 4.3 mmol/L
Sodium: 131 mmol/L — ABNORMAL LOW

## 2015-03-28 LAB — CBC
HCT: 31.9 % — ABNORMAL LOW (ref 35.0–47.0)
HGB: 10.5 g/dL — ABNORMAL LOW (ref 12.0–16.0)
MCH: 29.4 pg (ref 26.0–34.0)
MCHC: 32.8 g/dL (ref 32.0–36.0)
MCV: 90 fL (ref 80–100)
Platelet: 302 10*3/uL (ref 150–440)
RBC: 3.56 10*6/uL — ABNORMAL LOW (ref 3.80–5.20)
RDW: 14.9 % — ABNORMAL HIGH (ref 11.5–14.5)
WBC: 11.2 10*3/uL — ABNORMAL HIGH (ref 3.6–11.0)

## 2015-03-28 LAB — TROPONIN I
Troponin-I: 0.08 ng/mL — ABNORMAL HIGH
Troponin-I: 0.09 ng/mL — ABNORMAL HIGH
Troponin-I: 0.08 ng/mL — ABNORMAL HIGH

## 2015-03-28 LAB — PRO B NATRIURETIC PEPTIDE: B-Type Natriuretic Peptide: 497 pg/mL — ABNORMAL HIGH

## 2015-03-28 LAB — CULTURE, BLOOD (SINGLE)

## 2015-03-28 LAB — CK-MB
CK-MB: 2.1 ng/mL
CK-MB: 2.1 ng/mL
CK-MB: 1.9 ng/mL

## 2015-03-29 LAB — TSH: Thyroid Stimulating Horm: 0.741 u[IU]/mL

## 2015-03-29 LAB — PHOSPHORUS: Phosphorus: 4.7 mg/dL — ABNORMAL HIGH

## 2015-03-29 LAB — HEMOGLOBIN A1C: Hemoglobin A1C: 6.3 % — ABNORMAL HIGH

## 2015-03-31 ENCOUNTER — Ambulatory Visit (HOSPITAL_COMMUNITY)
Admission: AD | Admit: 2015-03-31 | Discharge: 2015-03-31 | Disposition: A | Discharge status: Home / Self Care | Payer: Medicare Other | Source: Other Acute Inpatient Hospital | Attending: Internal Medicine | Admitting: Internal Medicine

## 2015-03-31 DIAGNOSIS — I509 Heart failure, unspecified: Secondary | ICD-10-CM | POA: Diagnosis present

## 2015-03-31 LAB — RENAL FUNCTION PANEL
Albumin: 2.3 g/dL — ABNORMAL LOW
Anion Gap: 13 (ref 7–16)
BUN: 61 mg/dL — ABNORMAL HIGH
Calcium, Total: 7 mg/dL — CL
Chloride: 88 mmol/L — ABNORMAL LOW
Co2: 28 mmol/L
Creatinine: 10.2 mg/dL — ABNORMAL HIGH
EGFR (African American): 4 — ABNORMAL LOW
EGFR (Non-African Amer.): 4 — ABNORMAL LOW
Glucose: 144 mg/dL — ABNORMAL HIGH
Phosphorus: 3.7 mg/dL
Potassium: 4.2 mmol/L
Sodium: 129 mmol/L — ABNORMAL LOW

## 2015-03-31 LAB — PHOSPHORUS: Phosphorus: 3.6 mg/dL

## 2015-04-03 NOTE — Discharge Summary (Signed)
PATIENT NAME:  [name], [name] MR#:  [id number] DATE OF BIRTH:  19-Dec-1955  DATE OF ADMISSION:  03/28/2015 DATE OF DISCHARGE:  03/31/2015  PRIMARY CARE PHYSICIAN:  Nonlocal.   DISCHARGE DIAGNOSES: 1. Acute on chronic combined systolic and diastolic congestive heart failure, acute on chronic respiratory failure on home oxygen.  2. Elevated troponin due to demanding ischemia.  3. Hypertension. 4. Diabetes. 5. End-stage renal disease.  6. Morbid obesity.   CONDITION: Stable.   CODE STATUS: FULL CODE.   HOME MEDICATIONS: Please refer to the medication reconciliation list.   DIET: Low sodium, low fat, low cholesterol, ADA diet.   ACTIVITY: As tolerated.   FOLLOW-UP CARE: Follow with PCP within 1-2 weeks.   REASON FOR ADMISSION: Shortness of breath.   HOSPITAL COURSE: The patient is a 59 year old obese African American female with a history of CHF and ESRD who came to the ED due to shortness of breath. The patient was found with hypoxia, was put on BiPAP for respiratory support. For detailed history and physical examination, please refer to the admission note dictated by Dr. Marcille Blanco.  1. Acute on chronic CHF.  The patient's ejection fraction is  50-55%. The patient was put on BiPAP with FIO2 of 40%. In addition, the patient got Lasix IV and dialysis.  The patient's symptoms have much improved, but is still on 4 liters oxygen by nasal cannula.  2. Hypertension has been controlled with losartan, Norvasc, and hydralazine.  3. ESRD. The patient got hemodialysis as scheduled.  4. Diabetes has been treated with Lantus and sliding scale. Blood sugar is controlled.  5. Elevated troponin is possibly due to demanding ischemia due to respiratory failure and CHF decompensation.  6. CAD.  The patient has been treated with aspirin, Plavix, and Zocor.  7. Anemia of chronic disease, stable. The patient's symptoms have much improved, but still on oxygen by nasal cannula 4 liters. I discussed the  patient's discharge plan with the patient, nurse, case manager, and Education officer, museum.  Case manager and Education officer, museum discussed with the patient and the patient's family member.   They agreed to be discharged to [facility] today.   TIME SPENT: About 42 minutes.    ____________________________ Demetrios Loll, MD qc:tr D: 03/31/2015 12:08:57 ET T: 03/31/2015 12:38:11 ET JOB#: [id number]  cc: Demetrios Loll, MD, <Dictator> Demetrios Loll MD ELECTRONICALLY SIGNED 03/31/2015 19:30

## 2015-04-03 NOTE — H&P (Signed)
PATIENT NAMESHELLA, [name] MR#:  [id number] DATE OF BIRTH:  04/18/1956  DATE OF ADMISSION:  03/23/2015  REFERRING EMERGENCY ROOM PHYSICIAN:  Dr. Archie Balboa.    PRIMARY CARE PHYSICIAN:  Dr. Jeannetta Ellis with [facility].   CHIEF COMPLAINT:  Shortness of breath and fatigue.   HISTORY OF PRESENT ILLNESS:  This 59 year old woman with past medical history of end-stage renal disease, on hemodialysis Tuesdays, Thursdays and Saturdays; diabetes mellitus, type 2; morbid obesity; chronic respiratory failure, on 2 liters of oxygen at home; obstructive sleep apnea, on CPAP; presents today with 24 hours of worsening shortness of breath, subjective fevers and chills, nausea, and vomiting.  She reports that she has been exposed to a grandson who has similar upper respiratory tract symptoms.  She started feeling ill last night with gagging and vomiting of clear fluid.  No hematemesis.  She has been short of breath more than her usual baseline with a painful cough.  No chest pain unless she is coughing.  Pain is located in the left chest.  She was unable to actually take her temperature at home but has had diaphoresis and felt hot.  On Emergency Room workup, she is found to have a left lower lobe pneumonia.  She is febrile and tachycardic and being admitted for treatment of pneumonia.  Her troponin is also mildly elevated.   PAST MEDICAL HISTORY:  1. End-stage renal disease, on hemodialysis Tuesdays, Thursdays, and Saturdays.  2. Diabetes mellitus, type 2, insulin requiring.  3. Morbid obesity.  4. Chronic respiratory failure, on 2 liters nasal cannula chronically.  5. Gout.  6. Hypertension.  7. Hyperlipidemia.  8. Obstructive sleep apnea, on CPAP with good compliance.  9. Congestive heart failure, combined systolic and diastolic, with an ejection fraction of 50% to 55% from 2D echocardiogram in 2014.   SOCIAL HISTORY: The patient lives with her husband.  She quit smoking several years ago.  Does not  drink alcohol or use illicit substances.  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  She does not use a cane or a walker but is not very mobile.   FAMILY HISTORY:  Positive for coronary artery disease in her father, congestive heart failure in her mother.  She has one aunt with end-stage renal disease.   HOME MEDICATIONS:  1. Voltaren topical gel 1%, apply to affected area 4 times a day.  2. Vitamin D2 50,000 International Units 1 capsule once a week.  3. Triamcinolone topical 0.1% cream, apply to affected area twice a day.  4. Simvastatin 40 mg 1 tablet daily.  5. Sensipar 30 mg 1 tablet once a day.  6. Renvela 800 mg 3 tablets 3 times a day with meals.  7. Polyethylene glycol 17 grams orally once a day for constipation.  8. Os-Cal 1250 mg oral tablet 1 tablet 3 times a day.  9.  Omeprazole 20 mg 1 capsule once a day.  10.  Mupirocin topical 2% ointment, apply to affected area 3 times a day.  11.  Metolazone 5 mg 1 tablet once a day.  12.  Methylprednisolone 4 mg orally once a day.  13.  Lovaza 1 capsule twice a day.  14.  Losartan 50 mg 1 tablet once a day.  15.  Lantus 100 units/mL 50 units subcutaneously once a day.  16.  Hydrocortisone valerate 0.2% topical ointment, apply twice a day to affected area.  17.  Furosemide 80 mg 2 tablets 3  times a day.  18.  Fluticasone nasal spray 50 mcg per inhalation 1 spray to each nostril once a day.  19.  Ferrous sulfate 325 mg 1 tablet once a day.  20.  Docusate sodium 100 mg 1 capsule twice a day.  21.  Colcrys 0.6 mg 1 tablet once a day.  22.  Clopidogrel 75 mg 1 tablet once a day.  23.  Citalopram 40 mg 1 tablet once a day.  24.  Cetirizine 10 mg 1 tablet once a day.  25.  Aspirin 81 mg 1 tablet once a day.  26.  Amitriptyline 25 mg 1 tablet once a day at bedtime.  27.  Allopurinol 100 mg 1 tablet 3 times a week after dialysis.  28.  Acetaminophen/hydrocodone 325/5 mg 1 tablet orally every 6 hours as needed for pain.    ALLERGIES:  SHE IS ALLERGIC TO CONTRAST WHICH CAUSES SWELLING AND HIVES; ALLERGIC TO SHELLFISH, REACTION IS UNKNOWN.    REVIEW OF SYSTEMS:  CONSTITUTIONAL:  Positive for subjective fevers, fatigue, and weakness.  She is also noting increased weight above her dry weight at dialysis.  HEENT:  No change in vision or hearing.  She does have a chronic exudate from her eyes which is unchanged.  No pain in the eyes or ears.  She has had a sore throat.  No difficulty swallowing.  RESPIRATORY:  Positive for cough.  No wheezing, hemoptysis.  Positive for shortness of breath and painful cough.  CARDIOVASCULAR:  Positive for left-sided chest pain mainly with coughing.  No orthopnea. She does have mild edema.  No palpitations.  No syncope.  GASTROINTESTINAL:  Positive for nausea and vomiting.  No diarrhea.  No abdominal pain. No hematemesis or change in bowel habits.  GENITOURINARY:  No dysuria or frequency.  ENDOCRINE:  No polyuria, polydipsia, hot or cold intolerance.  SKIN:  No new rashes, lesions, or changes.  MUSCULOSKELETAL:  No new pain in the neck, back, shoulders, knees, or hips.  She does have a history of gout.  NEUROLOGIC:  No focal numbness or weakness.  No history of CVA, seizure, or headaches. No dementia.  PSYCHIATRIC:  No bipolar disorder or schizophrenia.   PHYSICAL EXAMINATION:  VITAL SIGNS:  Temperature 102, pulse 100, respirations 26, blood pressure 152/71, oxygenation 100% on 2 liters nasal cannula.  GENERAL:  The patient is obese and seems uncomfortable, sitting up in the exam bed.  HEENT:  Pupils equal, round, and reactive to light.  She does have a clear exudate from both eyes.  Extraocular motion intact.  Conjunctivae clear.  Oral mucous membranes pink and moist. Posterior oropharynx is difficult to visualize but seems clear of exudate, erythema, or edema.  NECK:  Obese.  No cervical lymphadenopathy.  Trachea is midline.  RESPIRATORY:  She has bibasilar crackles.  No wheezes,  rhonchi, or rales.  No respiratory distress.  Fair air movement.  CARDIOVASCULAR:  Distant.  Regular rate and rhythm.  No murmurs, rubs, or gallops.  She has trace pitting edema bilaterally with 1+ peripheral pulses.  ABDOMEN:  Distended and obese.  Bowel sounds are normal.  No guarding, no rebound, no hepatosplenomegaly.  Does not seem tender to palpation.  SKIN:  No rashes, lesions, or open wounds.  MUSCULOSKELETAL:  No joint effusions.  Strength is 5/5 throughout.  NEUROLOGIC:  Cranial nerves II through XII grossly intact.  Strength and sensation intact.  PSYCHIATRIC:  She is alert and oriented with good insight into her clinical condition.  LABORATORY DATA:  Sodium 129, potassium 5.0, chloride 89, bicarbonate 28, BUN 50, creatinine 9.72, glucose 128, calcium 7.7.  Troponin 0.09.  White blood cells 4.1, hemoglobin 12.2, platelets 277,000.  MCV is 90.   IMAGING:  Chest x-ray shows patchy density in both lung bases consistent with atelectasis and/or mild pneumonia, more pronounced than was seen in the late January.  No evidence of heart failure or fluid overload.   ASSESSMENT AND PLAN:  1. Pneumonia:  She has changes suggestive of pneumonia on her x-ray.  She has been febrile.  She has a mild leukocytosis.  She has been more short of breath than usual, and she is tachycardic.  Blood cultures and sputum cultures are pending.  She has received azithromycin and Rocephin in the Emergency Room, and I will continue with these antibiotics for now.  She is oxygenating at her baseline, requiring 2 liters of nasal cannula with good oxygen saturations.  No respiratory distress.  2. Elevated troponin:  This may be due to strain of pneumonia in a patient with congestive heart failure and end-stage renal disease.  Will continue to cycle cardiac enzymes and monitor her on telemetry.  She has been given aspirin and will continue with aspirin.  She has been tachycardic in the Emergency Room.  Will hold off on  starting a beta blocker unless her troponins increase.  She will have nitroglycerin as needed for chest pain.  At this point I think her chest pain is more likely due to the pneumonia.  3. End-stage renal disease, on hemodialysis:  Electrolytes are fairly stable.  She does not seem volume overloaded.  I have put in a nephrology consultation as she will need hemodialysis tomorrow.  4. Chronic respiratory failure, on 2 liters of nasal cannula, likely due to obstructive sleep apnea and morbid obesity:  This seems to be stable despite her current pneumonia.  5. Obstructive sleep apnea:  Continue with continuous positive airway pressure during this hospitalization.  6. Congestive heart failure, mild systolic and probably more significant diastolic failure:  No exacerbation at this time, though she does report her dry weight at hemodialysis has been increasing.  She may have some volume but no pulmonary edema at this time.  7. Diabetes mellitus, type 2:  Will check a hemoglobin A1c and continue home dose Lantus with sliding scale insulin, carbohydrate-modified diet.  8. Gout:  Will continue colchicine.  Hold allopurinol for now.   CODE STATUS:  Patient is a full code.   TIME SPENT ON ADMISSION:  Forty-five minutes    ____________________________ Earleen Newport. Volanda Napoleon, MD cpw:kc D: 03/23/2015 20:44:33 ET T: 03/23/2015 21:04:05 ET JOB#: [id number]  cc: Barnetta Chapel P. Volanda Napoleon, MD, <Dictator> Aldean Jewett MD ELECTRONICALLY SIGNED 03/31/2015 7:34

## 2015-04-03 NOTE — Discharge Summary (Signed)
PATIENT NAME:  [name], [name] MR#:  [id number] DATE OF BIRTH:  1956-07-02  DATE OF ADMISSION:  03/23/2015 DATE OF DISCHARGE:  03/25/2015   ADMITTING DIAGNOSIS: Shortness of breath.   DISCHARGE DIAGNOSES:  1. Shortness of breath due to pneumonia, now respiratory status significantly improved.  2. Elevated troponin due to demand ischemia. No cardiac chest pain.  3. End-stage renal disease on hemodialysis Tuesday, Thursday, Saturday.  4. Type 2 diabetes, insulin requiring.  5. Morbid obesity with a body mass index of 48.9.  6. Sleep apnea.  7. Chronic respiratory failure on 2 liters of oxygen chronically.  8. Gout.  9. Hypertension.  10. Hyperlipidemia.  11. History of combined systolic and diastolic congestive heart failure, chronic in nature without any evidence of acute exacerbation.   CONSULTANTS: (Dictation Anomaly)   TEXT>>  PERTINENT LABORATORY STUDIES AND EVALUATIONS: Admitting glucose 128, BUN 50, creatinine 9.72, sodium 129, potassium 5.0, chloride 89, CO2 of 28, calcium was 7.7. Troponin was 0.09, 0.10, and 0.11. WBC 14.1, hemoglobin 12.2, platelet count was 277,000. Blood cultures: One was no growth. A second was coagulase-negative staph, likely skin contamination.   HOSPITAL COURSE: Please refer to H and P done by the admitting physician. The patient is a 59 year old African American female with medical history of end-stage renal disease with diabetes, morbid obesity, sleep apnea, who presented to the hospital with worsening shortness of breath, cough, fevers, chills. The patient came to the ED and was diagnosed with pneumonia. She was admitted and placed on broad-spectrum antibiotics. The patient's cough and shortness of breath improved. She also was placed on a prednisone taper. The patient also was dialyzed due to her end-stage renal disease. She is doing much better; however, she is very weak and needs further rehabilitation. She is currently stable for discharge.   DISCHARGE  MEDICATIONS: Amitriptyline 25 p.o. at bedtime, cetirizine 10 mg daily, citalopram 40 daily, omeprazole 20 daily, Lasix 80 mg 2 tabs t.i.d., Renvela 800 mg 3 capsules t.i.d., losartan 50 daily, Colcrys 0.6 daily, Lantus 50 units daily, vitamin D2 50,000 international units weekly, metolazone 5 mg once a day, Sensipar 30 daily, allopurinol 100 mg 1 tablet 3 times a week after dialysis, aspirin 81 mg 1 tab p.o. daily, Os-Cal 1250 one tablet p.o. t.i.d., Plavix 75 p.o. daily, Colace 100 mg 1 tablet p.o. b.i.d. as needed, iron sulfate 325 p.o. daily, fluticasone nasal spray once daily, hydrocortisone applied to affected area b.i.d., Lovaza 2 capsule p.o. b.i.d., MiraLax 17 grams daily as needed, simvastatin 40 at bedtime, Voltaren topically 1% to affected area 4 times a day, acetaminophen/hydrocodone 325/5 q.6 p.r.n., Humulin-R sliding scale, Ceftin 500 mg 1 tablet p.o. b.i.d. x7 days, Zithromax 500 one tablet p.o. daily x 3 days, prednisone taper with 5 mg 4 tablets on day 1, 3 tablets on day 2, 2 tablets on day 3, 1 tablet on day 4 and 5, then stop. Home of oxygen 2 liters continuously.   DIET: Low sodium, carbohydrate renal diet. Regular consistency.   DISCHARGE ACTIVITY: As tolerated. Physical therapy. Follow up with the doctors at the rehabilitation facility in 1 to 2 days. CPAP at bedtime.  TIME SPENT: 35 minutes on this discharge.    ____________________________ Lafonda Mosses. Posey Pronto, MD shp:ah D: 03/26/2015 08:44:00 ET T: 03/26/2015 09:15:56 ET JOB#: [id number]  cc: Shreyang H. Posey Pronto, MD, <Dictator> Alric Seton MD ELECTRONICALLY SIGNED 04/01/2015 14:40

## 2015-04-03 NOTE — H&P (Signed)
PATIENT NAME:  [name], [name] MR#:  [id number] DATE OF BIRTH:  1956/08/28  DATE OF ADMISSION:  03/28/2015  REFERRING PHYSICIAN: Charlesetta Ivory, MD   PRIMARY CARE PHYSICIAN: Nonlocal.   ADMISSION DIAGNOSIS: Acute on chronic congestive heart failure exacerbation with pulmonary edema.   HISTORY OF PRESENT ILLNESS: This is a 59 year old African American female who presents to the Emergency Department from a local nursing home complaining of shortness of breath. The patient states that her dyspnea began the day before admission shortly after an episode of sharp midsternal chest pain. The patient states that the pain lasted "a while" and stopped after she awoke from a nap. Later she became short of breath. She denies cough or production of frothy sputum. She was recently admitted to the hospital for flu and sent to the nursing home for rehabilitation. Upon arrival to the Emergency Department, the patient was in acute distress and required BiPAP for respiratory support. Due to her respiratory distress the Emergency Department staff called for admission.   REVIEW OF SYSTEMS: CONSTITUTIONAL: The patient denies fever, but admits to generalized weakness.  EYES: Denies blurred vision or inflammation.  EARS, NOSE AND THROAT: Denies tinnitus or sore throat.  RESPIRATORY: Admits to shortness of breath, but denies cough.  CARDIOVASCULAR: Denies chest pain at this time and denies palpitations or paroxysmal nocturnal dyspnea. The patient admits to having 3 pillow orthopnea.  GASTROINTESTINAL: Denies nausea, vomiting, diarrhea, or abdominal pain.  GENITOURINARY: Denies dysuria, increased frequency, or hesitancy of urination.  ENDOCRINE: Denies polyuria or polydipsia.  HEMATOLOGIC AND LYMPHATIC: Denies easy bruising or bleeding.  MUSCULOSKELETAL: The patient denies arthralgias or myalgias.  INTEGUMENTARY: Denies rashes or lesions.  NEUROLOGIC: Denies numbness in her extremities or dysarthria.  PSYCHIATRIC:  Denies depression or suicidal ideation.   PAST MEDICAL HISTORY: Congestive heart failure combined diastolic and systolic with last ejection fraction 50% to 55%, end-stage renal disease on dialysis Tuesdays, Thursdays and Saturdays, diabetes type 2, hypertension, COPD, hyperlipidemia, obstructive sleep apnea and gout.   PAST SURGICAL HISTORY: The patient has not undergone any surgeries.   SOCIAL HISTORY: The patient usually lives with her husband, but has been at [facility] for rehab. She does not smoke, drink or do any drugs.   FAMILY HISTORY: Significant for coronary artery disease, hypertension and diabetes, as well as lung cancer in an aunt.   MEDICATIONS: 1.  Acetaminophen with hydrocodone 325 mg/5 mg 1 tablet p.o. every 6 hours as needed for pain.  2.  Allopurinol 100 mg 1 tablet p.o. t.i.d. after dialysis.  3.  Amitriptyline 25 mg 1 tablet p.o. at bedtime.  4.  Aspirin 81 mg 1 tablet p.o. daily.  5.  Cetirizine 10 mg 1 tablet p.o. daily.  6.  Citalopram 40 mg 1 tablet p.o. daily.  7.  Clopidogrel 75 mg 1 tablet p.o. daily.  8.  Colcrys 0.6 mg 1 tablet p.o. daily.  9.  Ferrous sulfate 325 mg 1 tablet p.o. every morning.  10.  Fluticasone nasal spray 50 mcg/inhalation 1 spray to each nostril once daily.  11.  Furosemide 80 mg 2 tablets p.o. t.i.d.  12.  Humulin subcutaneous sliding scale.  13.  Hydrocortisone valerate 0.2% topical ointment applied to effected area 2 times a day.  14.  Lantus 50 units subcutaneously once a day.  15.  Losartan 50 mg 1 tablet p.o. daily.  16.  Lovaza ethyl esters 1000 mg 1 capsule p.o. b.i.d.  17.  Metolazone 5 mg 1 tablet p.o. daily.  18.  Omeprazole 20 mg one delayed-release capsule p.o. daily.  19.  Os-Cal 1250 mg 1 tablet p.o. t.i.d.  20.  Polyethylene glycol 3350 oral powder for reconstitution 17 grams mixed in 8 ounces of water daily as needed for constipation.  21.  Prednisone taper. It appears the patient has finished this  prescription.  22.  Renvela 800 mg 3 tablets p.o. t.i.d. with meals.  23.  Sensipar 30 mg 1 tablet p.o. daily.  24.  Simvastatin 40 mg 1 tablet p.o. at bedtime.  25.  Vitamin D2 50,000 international units 1 capsule p.o. once weekly.  26.  Voltaren topical 1% gel applied to effected area 4 times a day.  27.  Zithromax Z-Pak to complete pack as directed.   ALLERGIES: Contrast iodinated radiocontrast dye as well as shellfish.   PERTINENT LABORATORY RESULTS AND RADIOGRAPHIC FINDINGS: Serum glucose is 176, BUN is 497, creatinine is 9.31, serum sodium 131, potassium 4.3, chloride is 87, bicarb is 28, and calcium is 7.5. Troponin is 0.08. White blood cell count is 11.2, hemoglobin is 10.5, hematocrit is 31.9, platelet count is 302,000 and MCV is 90.  ABG shows a pH of 7.43, pCO2 of 44, and pO2 of 96 on FiO2 of 40% with base excess of 4.   Chest x-ray shows cardiomegaly with findings most consistent with mild to moderate pulmonary edema likely related to acute CHF exacerbation. There is a probable small left plural effusion.   PHYSICAL EXAMINATION: VITAL SIGNS: Temperature is 100 rectally, pulse 92, respirations 22, blood pressure 180/91, and pulse oximetry is 95% on 40% FiO2 via BiPAP.  GENERAL: The patient is alert and oriented. She is clearly in respiratory distress as she has BiPAP on her face.  HEENT: Normocephalic, atraumatic. Pupils equal, round, and reactive to light and accommodation. Extraocular movements are intact. Mucous membranes are moist. Dentures are in place.  NECK: Trachea is midline. No adenopathy. Thyroid nonpalpable, nontender.  CHEST: Symmetric and atraumatic.  CARDIOVASCULAR: Regular rate and rhythm. Normal S1, S2. No rubs, clicks, or murmurs appreciated.  LUNGS: Clear to auscultation bilaterally. The patient is wearing BiPAP.  ABDOMEN: Positive bowel sounds. Soft, nontender, nondistended. No hepatosplenomegaly.  GENITOURINARY: Deferred.  MUSCULOSKELETAL: The patient moves  all 4 extremities equally, although I have not tested her gait. She has 5/5 strength in upper and lower extremities bilaterally.  SKIN: There are no rashes or lesions.  EXTREMITIES: No clubbing, cyanosis, or edema.  NEUROLOGIC: Cranial nerves II through XII are grossly intact.  PSYCHIATRIC: Mood is normal. Affect is congruent. The patient has fairly good judgment and insight into her medical condition.   ASSESSMENT AND PLAN: This is a 59 year old female admitted for acute on chronic congestive heart failure exacerbation.  1.  Acute on chronic congestive heart failure. The patient has combined systolic and diastolic heart failure. She was given 40 mg of intravenous Lasix in the Emergency Department as she still makes some urine. She was placed on BiPAP with a FiO2 of 40% and feels more comfortable now, albeit still tachypneic. She may need an extra dialysis treatment.  2.  Hypertension. Continue losartan.  3.  Diabetes type 2. Continue basal insulin as well as sliding scale insulin.  4.  End-stage renal disease. The patient usually has dialysis on Tuesdays, Thursdays and Saturdays. We will consult nephrology as she may benefit from an extra treatment today with this pulmonary edema.  5.  Gout. We will continue allopurinol on dialysis days.  6.  Morbid obesity. Body mass index is 51.  I have encouraged the patient to partake of a healthy diet and exercise.  7.  Leukocytosis. The patient's white blood cell count is actually decreased from her previous admission and may be due to the steroids that she had been taking prior to admission.  8.  Deep vein thrombosis prophylaxis. Heparin.  9.  Gastrointestinal prophylaxis. None.  CODE STATUS: FULL.  TIME SPENT ON ADMISSION ORDERS AND PATIENT CARE: Approximately 40 minutes.  ____________________________ Norva Riffle. Marcille Blanco, MD msd:sb D: 03/28/2015 08:43:49 ET T: 03/28/2015 09:05:15 ET JOB#: [id number]  cc: Norva Riffle. Marcille Blanco, MD, <Dictator> Norva Riffle  Amoni Morales MD ELECTRONICALLY SIGNED 03/29/2015 0:21

## 2015-04-11 ENCOUNTER — Ambulatory Visit: Payer: Medicare Other

## 2015-05-04 ENCOUNTER — Encounter: Payer: Self-pay | Admitting: Podiatry

## 2015-05-04 ENCOUNTER — Ambulatory Visit (INDEPENDENT_AMBULATORY_CARE_PROVIDER_SITE_OTHER): Payer: Medicare Other | Admitting: Podiatry

## 2015-05-04 VITALS — BP 136/70 | HR 71 | Temp 99.1°F | Resp 16

## 2015-05-04 DIAGNOSIS — L02612 Cutaneous abscess of left foot: Secondary | ICD-10-CM

## 2015-05-04 DIAGNOSIS — L03032 Cellulitis of left toe: Secondary | ICD-10-CM

## 2015-05-04 NOTE — Progress Notes (Signed)
She presents today for follow-up of a small area of fluctuance beneath her distal phalanx proximal to her nail bed left foot. Her husband states that something Y came out of a small hole he says brother wasn't sticking it did look like pus.  Objective: Vital signs are stable alert and oriented 3. Pulses are palpable. Mild erythema overlying the small fistula which does appear to be using more of a harder thicker substance does not appear to be infectious in nature we will send it for a culture and sensitivity because there is very little to sample. Should we be able to gain a larger sample next visit we will send that for pathology. Currently it does not appear to be infectious.  Assessment: Small fistula abscess hallux left.  Plan: Continue conservative therapies and I will notify them in 2 weeks as to the results of there culture and sensitivity. I will see them at that time. X-ray should be taken at that time. 3 views toes left.

## 2015-05-10 ENCOUNTER — Telehealth: Payer: Self-pay | Admitting: *Deleted

## 2015-05-10 MED ORDER — AMOXICILLIN-POT CLAVULANATE 875-125 MG PO TABS: 1.0000 | ORAL_TABLET | Freq: Two times a day (BID) | ORAL | Status: DC

## 2015-05-10 NOTE — Telephone Encounter (Signed)
Spoke to patient regarding her labs also prescribed antibiotic for bacteria

## 2015-05-10 NOTE — Telephone Encounter (Signed)
Pt called for her labwork on her toe.

## 2015-05-23 ENCOUNTER — Ambulatory Visit (INDEPENDENT_AMBULATORY_CARE_PROVIDER_SITE_OTHER): Payer: Medicare Other | Admitting: Podiatry

## 2015-05-23 ENCOUNTER — Encounter: Payer: Self-pay | Admitting: Podiatry

## 2015-05-23 ENCOUNTER — Ambulatory Visit (INDEPENDENT_AMBULATORY_CARE_PROVIDER_SITE_OTHER): Payer: Medicare Other

## 2015-05-23 VITALS — BP 200/91 | HR 89 | Resp 20

## 2015-05-23 DIAGNOSIS — M79673 Pain in unspecified foot: Secondary | ICD-10-CM | POA: Diagnosis not present

## 2015-05-23 DIAGNOSIS — B351 Tinea unguium: Secondary | ICD-10-CM | POA: Diagnosis not present

## 2015-05-23 DIAGNOSIS — L02612 Cutaneous abscess of left foot: Secondary | ICD-10-CM

## 2015-05-23 DIAGNOSIS — L03032 Cellulitis of left toe: Secondary | ICD-10-CM

## 2015-05-23 MED ORDER — LEVOFLOXACIN 750 MG PO TABS: 750.0000 mg | ORAL_TABLET | Freq: Every day | ORAL | Status: DC

## 2015-05-23 NOTE — Progress Notes (Signed)
She presents today for follow-up of her painful hallux left. She states that seems to be doing much better and draining less. She is finishing up her antibiotic-coated today.  Objective: Vital signs are stable she is alert and oriented 3. Her last culture came back with gram-negative rods. No purulence today. No malodor. Pulses remain palpable radiographic evaluation does not demonstrate any type of osseus abnormalities.  Assessment: Abscess left hallux resolving.  Plan: Change drainage by out into Levaquin 750 one by mouth daily 10 days follow up with her in 2 weeks

## 2015-05-29 IMAGING — CR DG CHEST 2V
1 series · 2 of 2 positions shown · non-contrast
Comparison: 01/02/2015

CLINICAL DATA: Dialysis patient with shortness of breath.

EXAM:
CHEST  2 VIEW

[Series 1: dxr chest pa (or ap) and lateral · 0.14mm/px · 2 of 2 slices shown]
[im 1/2]
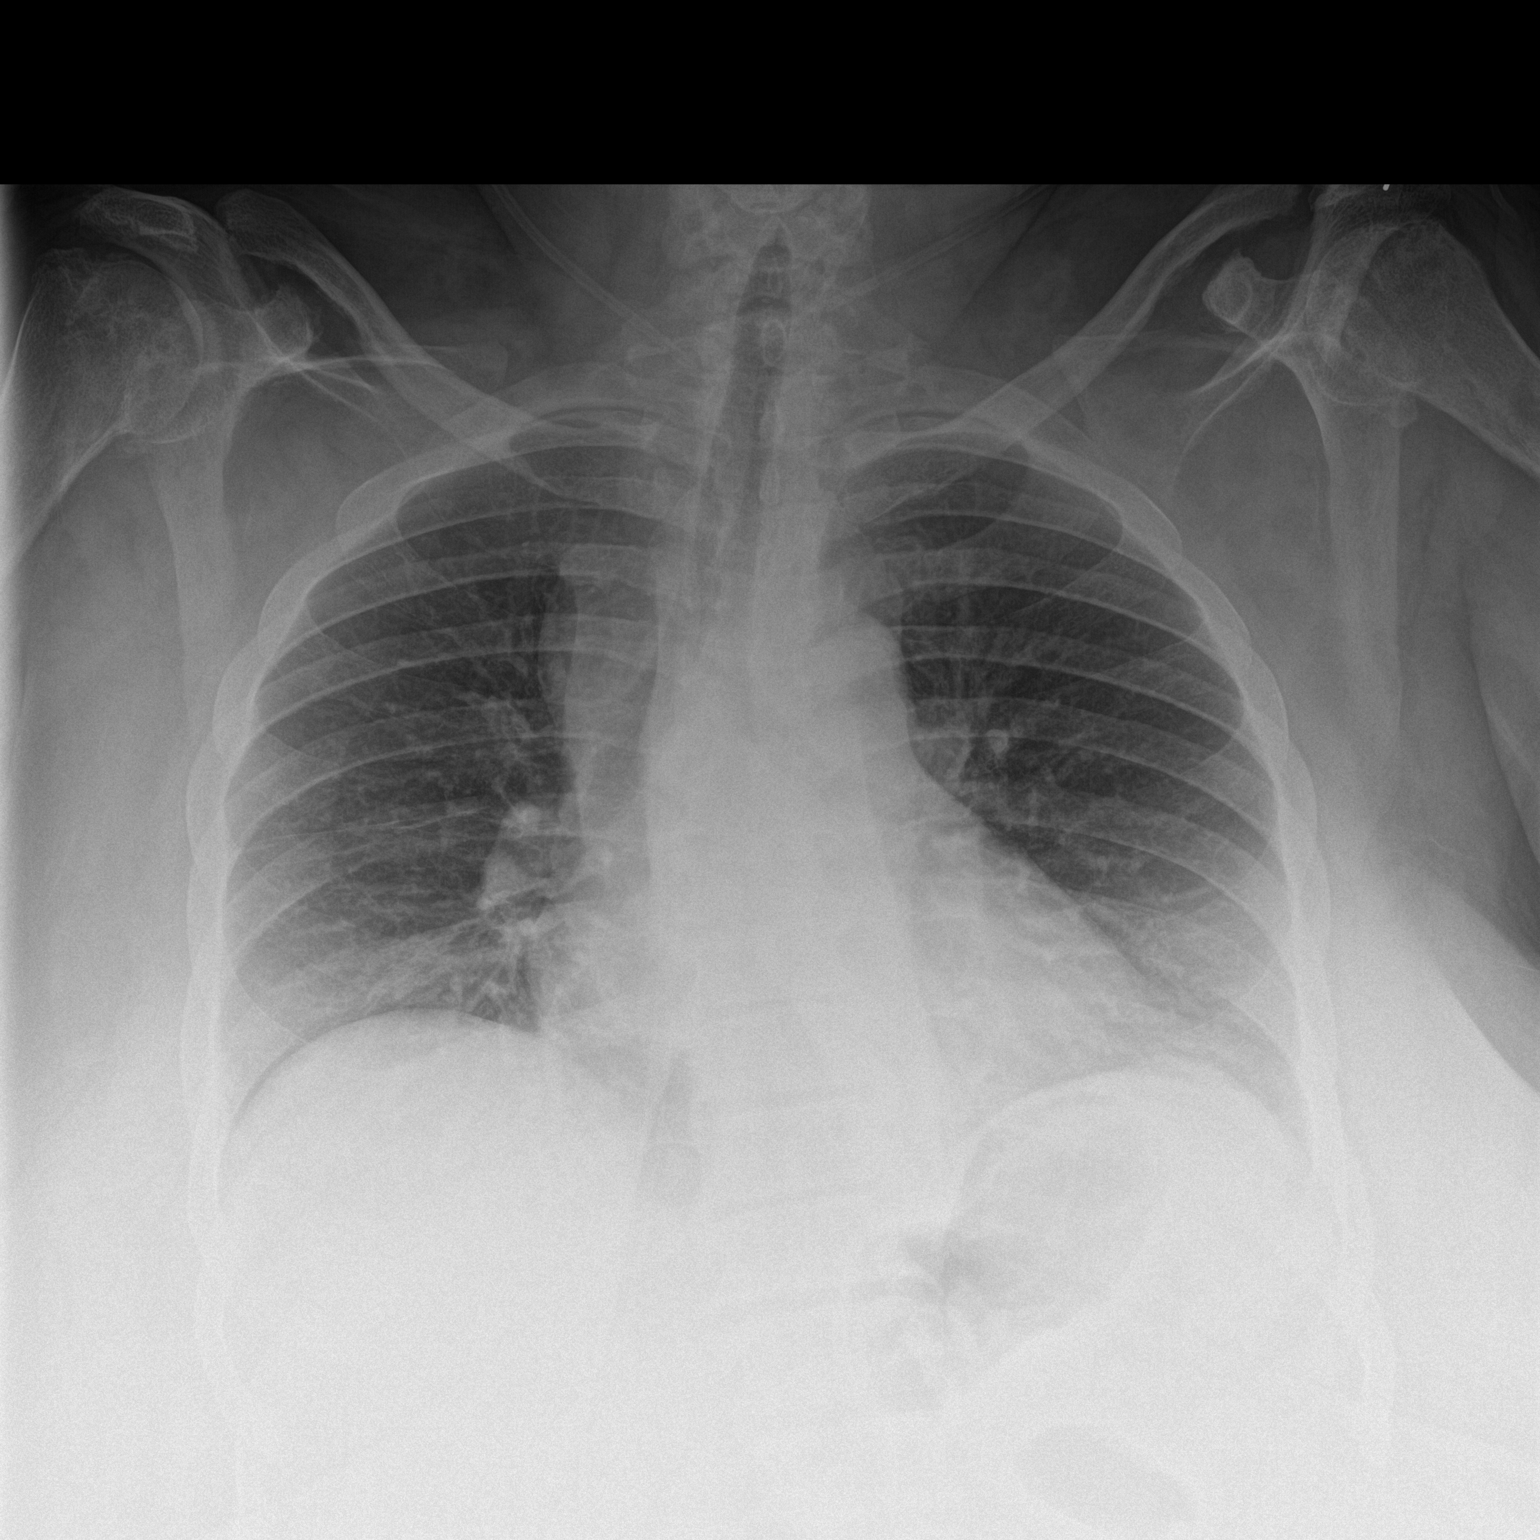
[im 2/2]
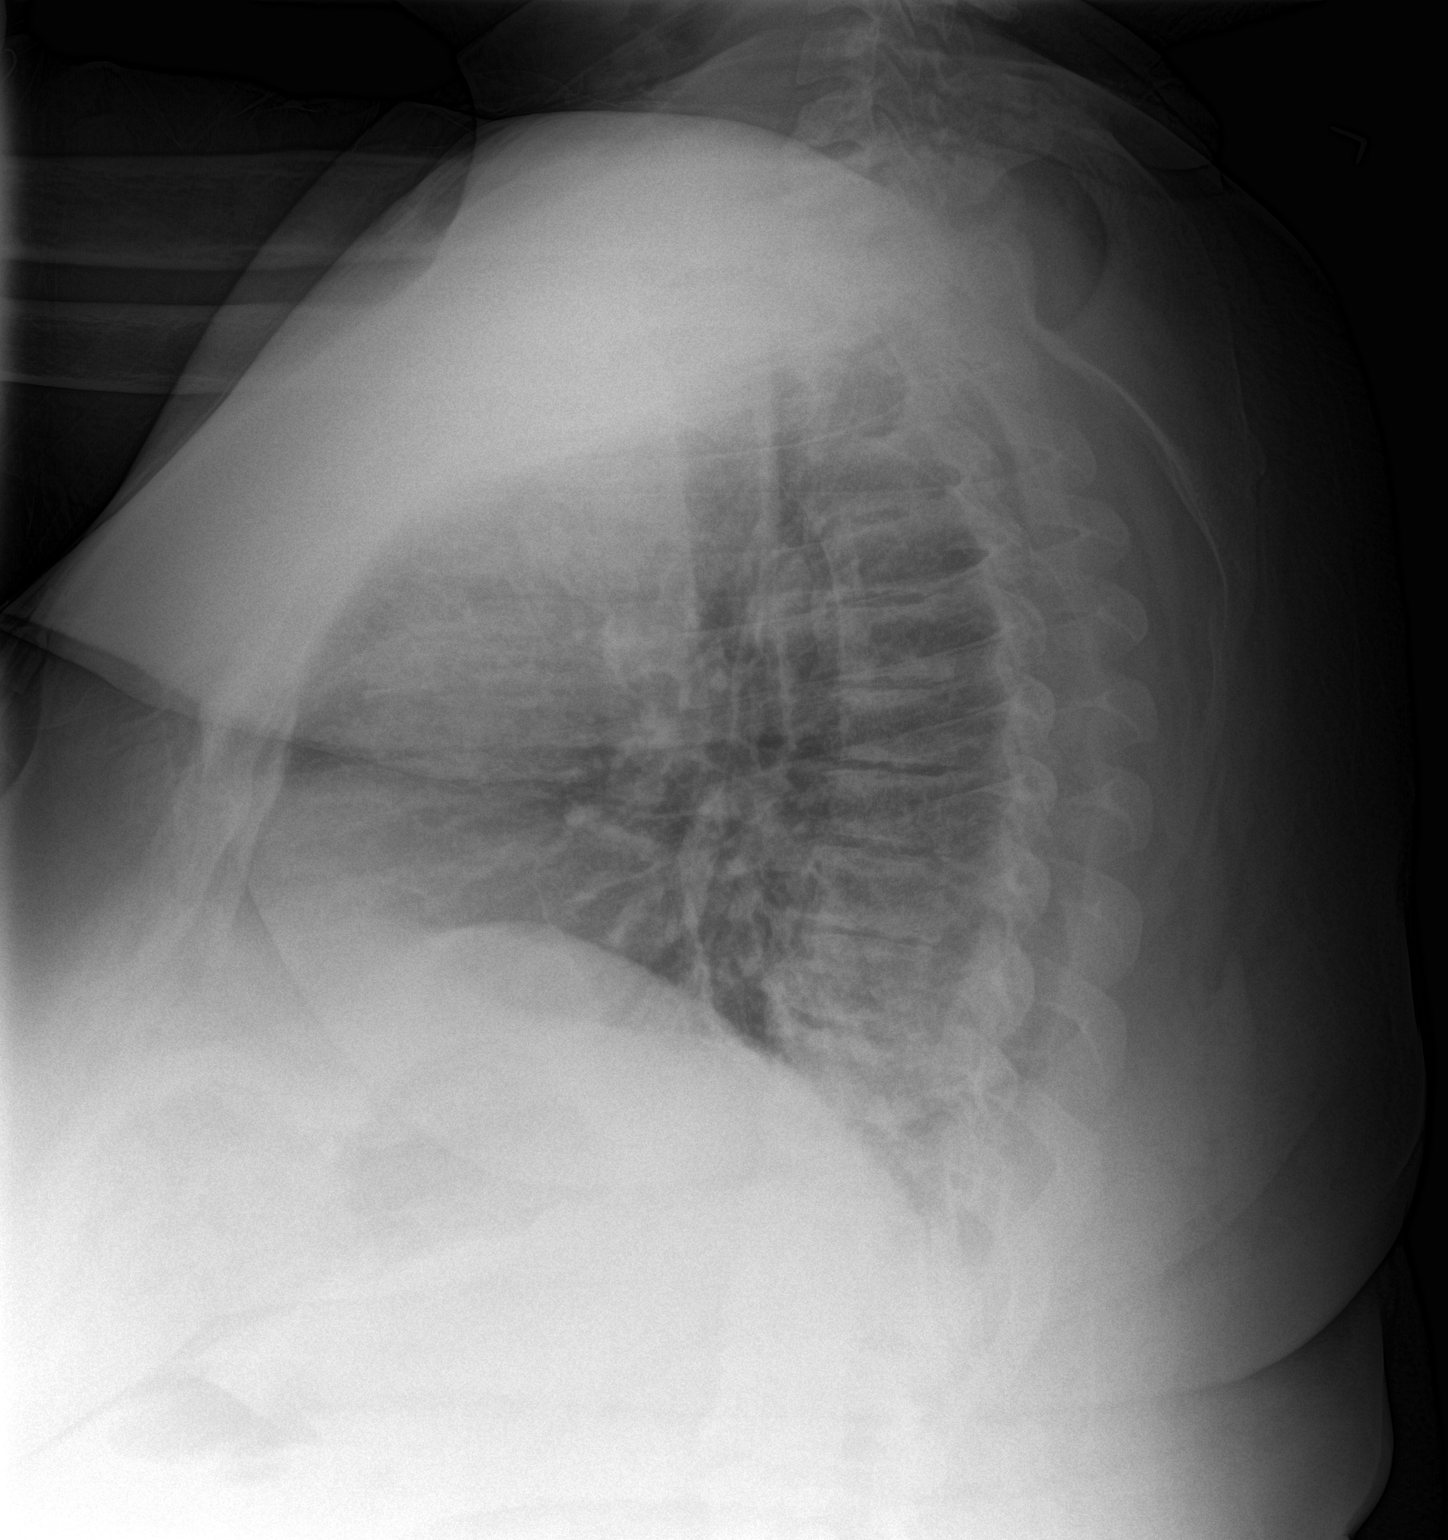

[2 of 2 positions shown; findings below may reference images not displayed]

FINDINGS: Heart size is normal. Mediastinum is prominent likely due to chronic
vascular prominence. The upper lungs are clear. There is patchy
infiltrate and/or atelectasis at both lung bases, best seen on the
lateral view. No dense consolidation or lobar collapse. No
effusions. No edema.
IMPRESSION: Patchy density at both lung bases consistent with atelectasis and or
mild pneumonia, more pronounced than was seen in late [REDACTED]. No
evidence heart failure or fluid overload by radiography.

## 2015-06-08 ENCOUNTER — Ambulatory Visit: Payer: Medicare Other | Admitting: Podiatry

## 2015-06-29 ENCOUNTER — Ambulatory Visit (INDEPENDENT_AMBULATORY_CARE_PROVIDER_SITE_OTHER): Payer: Medicare Other | Admitting: Podiatry

## 2015-06-29 DIAGNOSIS — M869 Osteomyelitis, unspecified: Secondary | ICD-10-CM

## 2015-06-29 NOTE — Progress Notes (Signed)
She presents today for follow-up of ulceration to the dorsal medial aspect of the hallux left. She states that seems to go ago and she's been taking her into biotics every other day secondary to her hemodialysis. She denies fever chills nausea vomiting muscle aches and pains.  Objective: Vital signs are stable alert and oriented 3. Six-week duration of superficial ulceration overlying the dorsomedial aspect of the distal phalanx hallux left. Mild overlying erythema no open wound today fluctuance beneath the lesion indicative of abscess or seroma. Radiographs do not demonstrate any type of osteomyelitis.  Assessment: Rule out osteomyelitis versus abscess hallux left.  Plan: Discussed etiology pathology conservative or surgical therapies at this point I think MRI would be her best diagnostic tool in light of all other negative findings. Follow up with her once her MRI returns

## 2015-07-01 ENCOUNTER — Encounter: Payer: Self-pay | Admitting: *Deleted

## 2015-07-01 NOTE — Progress Notes (Signed)
Called [employer] medic[employer] and no precert is required. Will send order over to facility for scheduling.

## 2015-07-13 ENCOUNTER — Ambulatory Visit
Admission: RE | Admit: 2015-07-13 | Discharge: 2015-07-13 | Disposition: A | Discharge status: Home / Self Care | Payer: Medicare Other | Source: Ambulatory Visit | Attending: Podiatry | Admitting: Podiatry

## 2015-07-13 DIAGNOSIS — M869 Osteomyelitis, unspecified: Secondary | ICD-10-CM

## 2015-07-15 ENCOUNTER — Ambulatory Visit
Admission: RE | Admit: 2015-07-15 | Discharge: 2015-07-15 | Disposition: A | Discharge status: Home / Self Care | Payer: Medicare Other | Source: Ambulatory Visit | Attending: Podiatry | Admitting: Podiatry

## 2015-07-15 DIAGNOSIS — L089 Local infection of the skin and subcutaneous tissue, unspecified: Secondary | ICD-10-CM | POA: Insufficient documentation

## 2015-07-18 NOTE — Progress Notes (Signed)
[name] will schedule patient an appointment to discuss MRI results

## 2015-07-19 ENCOUNTER — Telehealth: Payer: Self-pay | Admitting: *Deleted

## 2015-07-19 NOTE — Telephone Encounter (Addendum)
-----   Message from Garrel Ridgel, [place] sent at 07/18/2015  8:05 AM EDT ----- Have her in.  Left message requesting that pt make an appt, the female that answered the phone stated pt was in dialysis and has an appt in [facility] 07/20/2015.

## 2015-07-20 ENCOUNTER — Encounter: Payer: Self-pay | Admitting: Podiatry

## 2015-07-20 ENCOUNTER — Ambulatory Visit (INDEPENDENT_AMBULATORY_CARE_PROVIDER_SITE_OTHER): Payer: Medicare Other | Admitting: Podiatry

## 2015-07-20 VITALS — BP 158/65 | HR 89 | Resp 18

## 2015-07-20 DIAGNOSIS — M6740 Ganglion, unspecified site: Secondary | ICD-10-CM

## 2015-07-20 DIAGNOSIS — M67479 Ganglion, unspecified ankle and foot: Secondary | ICD-10-CM

## 2015-07-20 MED ORDER — LEVOFLOXACIN 750 MG PO TABS: 750.0000 mg | ORAL_TABLET | Freq: Every day | ORAL | Status: DC

## 2015-07-20 NOTE — Progress Notes (Signed)
Presents today with her husband. She is wheelchair-bound with nasal cannula. She states that her toes doing much better she refers to the hallux left she states it has not been draining. She denies any changes in her past medical history medications allergies.  Objective: 59 year old black female currently on dialysis wheelchair-bound with apnea. MRI results demonstrate cyst consistent with a mucoid cyst or small abscess dorsal aspect hallux left but does not demonstrate any signs of osteomyelitis. On physical exam the toe does not demonstrate any purulence no odor in the abscess appears to be decreasing in size.  Assessment: Abscess per MRI dorsal aspect left foot. This appears to be healing.  Plan: Husband is continue to dress this at home and she will finish up with her antibiotic's that she is currently taking. I will follow-up with her in 4 weeks to reevaluate this toe and for nail debridement.

## 2015-08-17 ENCOUNTER — Encounter: Payer: Self-pay | Admitting: Podiatry

## 2015-08-17 ENCOUNTER — Ambulatory Visit (INDEPENDENT_AMBULATORY_CARE_PROVIDER_SITE_OTHER): Payer: Medicare Other | Admitting: Podiatry

## 2015-08-17 VITALS — BP 185/76 | HR 95 | Resp 16

## 2015-08-17 DIAGNOSIS — L97521 Non-pressure chronic ulcer of other part of left foot limited to breakdown of skin: Secondary | ICD-10-CM

## 2015-08-17 DIAGNOSIS — M79676 Pain in unspecified toe(s): Secondary | ICD-10-CM | POA: Diagnosis not present

## 2015-08-17 DIAGNOSIS — B351 Tinea unguium: Secondary | ICD-10-CM | POA: Diagnosis not present

## 2015-08-17 MED ORDER — AMOXICILLIN-POT CLAVULANATE 875-125 MG PO TABS: 1.0000 | ORAL_TABLET | Freq: Two times a day (BID) | ORAL | Status: DC

## 2015-08-17 MED ORDER — MUPIROCIN 2 % EX OINT: TOPICAL_OINTMENT | CUTANEOUS | Status: DC

## 2015-08-17 NOTE — Progress Notes (Signed)
She presents once again today for follow-up of this superficial abscess that has opened up to the dorsal aspect of her left great toe. She states that her grandson stepped on it causing it to tear open. Her husband states that there has been purulence from within. She denies fever chills nausea vomiting muscle aches and pains. No change in her diabetes.  Objective: Vital signs are stable she is alert and oriented 3. Pulses are minimally palpable. Superficial abscess has ruptured and has a very thick discharge this does not appear to be bone and does not have any odor.  Assessment: Diabetic ulceration abscess hallux left.  Plan: Continue current therapies of soaking and Bactroban ointment I also wrote another prescription for Augmentin will follow up with her in 2 weeks just to reevaluate. If it is close at that time we can get back into regular shoe gear.

## 2015-08-31 ENCOUNTER — Ambulatory Visit: Payer: Medicare Other | Admitting: Podiatry

## 2015-09-05 ENCOUNTER — Ambulatory Visit (INDEPENDENT_AMBULATORY_CARE_PROVIDER_SITE_OTHER): Payer: Medicare Other | Admitting: Podiatry

## 2015-09-05 ENCOUNTER — Encounter: Payer: Self-pay | Admitting: Podiatry

## 2015-09-05 VITALS — BP 150/67 | HR 90 | Resp 12

## 2015-09-05 DIAGNOSIS — M779 Enthesopathy, unspecified: Secondary | ICD-10-CM | POA: Diagnosis not present

## 2015-09-05 DIAGNOSIS — M2041 Other hammer toe(s) (acquired), right foot: Secondary | ICD-10-CM | POA: Diagnosis not present

## 2015-09-05 DIAGNOSIS — L97521 Non-pressure chronic ulcer of other part of left foot limited to breakdown of skin: Secondary | ICD-10-CM | POA: Diagnosis not present

## 2015-09-05 NOTE — Progress Notes (Signed)
She presents today for follow-up of her abscess and ulceration hallux left. She states that I think it has healed up.   Objective: vital signs are stable she is alert and oriented 3. Pulses are palpable bilateral. Decreased sensorium per Semmes-Weinstein monofilament. Ulceration has healed up 100% no erythema edema saline as drainage or odor. Severe hammertoe deformities bilateral.  Assessment: diabetic peripheral neuropathy with hammertoe deformities an ulceration which has gone on to heal hallux left.    plan : she was scanned today for a set of diabetic shoes and I redressed the toe after debriding the ulcerative lesion.

## 2015-10-03 ENCOUNTER — Ambulatory Visit: Payer: Medicare Other | Admitting: Podiatry

## 2015-10-13 ENCOUNTER — Encounter: Payer: Medicare Other | Admitting: *Deleted

## 2015-12-19 ENCOUNTER — Ambulatory Visit (INDEPENDENT_AMBULATORY_CARE_PROVIDER_SITE_OTHER): Payer: Medicare Other | Admitting: Podiatry

## 2015-12-19 ENCOUNTER — Encounter: Payer: Self-pay | Admitting: Podiatry

## 2015-12-19 DIAGNOSIS — B351 Tinea unguium: Secondary | ICD-10-CM | POA: Diagnosis not present

## 2015-12-19 DIAGNOSIS — L97521 Non-pressure chronic ulcer of other part of left foot limited to breakdown of skin: Secondary | ICD-10-CM

## 2015-12-19 DIAGNOSIS — M79676 Pain in unspecified toe(s): Secondary | ICD-10-CM | POA: Diagnosis not present

## 2015-12-19 NOTE — Progress Notes (Signed)
She presents today with chief complaint of painful elongated toenails bilateral. She also concerned about the chronic abscess to the distal aspect hallux left.  Objective: Vital signs are stable alert and oriented 3. Presents today with her husband in a wheelchair. Pulses are palpable. No open lesions or wounds to the hallux left. No draining no signs of infection. Her toenails are thick yellow dystrophic, mycotic bilateral.  Assessment: Pain in limb secondary to onychomycosis bilateral. Well-healing abscess dorsal aspect left foot.  Plan: Debridement of nails bilateral covers are secondary to pain. Call with questions or concerns particularly regarding the Alex left should this abscess recur.

## 2016-01-12 ENCOUNTER — Other Ambulatory Visit: Payer: Self-pay | Admitting: Vascular Surgery

## 2016-01-18 ENCOUNTER — Other Ambulatory Visit: Payer: Self-pay | Admitting: Vascular Surgery

## 2016-01-23 ENCOUNTER — Encounter
Admission: RE | Disposition: A | Discharge status: Home / Self Care | Payer: Self-pay | Source: Ambulatory Visit | Attending: Vascular Surgery

## 2016-01-23 ENCOUNTER — Other Ambulatory Visit: Payer: Self-pay | Admitting: Vascular Surgery

## 2016-01-23 ENCOUNTER — Ambulatory Visit
Admission: RE | Admit: 2016-01-23 | Discharge: 2016-01-23 | Disposition: A | Discharge status: Home / Self Care | Payer: Medicare Other | Source: Ambulatory Visit | Attending: Vascular Surgery | Admitting: Vascular Surgery

## 2016-01-23 ENCOUNTER — Encounter: Payer: Self-pay | Admitting: *Deleted

## 2016-01-23 DIAGNOSIS — I6350 Cerebral infarction due to unspecified occlusion or stenosis of unspecified cerebral artery: Secondary | ICD-10-CM | POA: Insufficient documentation

## 2016-01-23 DIAGNOSIS — I509 Heart failure, unspecified: Secondary | ICD-10-CM | POA: Diagnosis not present

## 2016-01-23 DIAGNOSIS — N186 End stage renal disease: Secondary | ICD-10-CM | POA: Diagnosis not present

## 2016-01-23 DIAGNOSIS — Z7902 Long term (current) use of antithrombotics/antiplatelets: Secondary | ICD-10-CM | POA: Diagnosis not present

## 2016-01-23 DIAGNOSIS — Z992 Dependence on renal dialysis: Secondary | ICD-10-CM | POA: Diagnosis not present

## 2016-01-23 DIAGNOSIS — Z6841 Body Mass Index (BMI) 40.0 and over, adult: Secondary | ICD-10-CM | POA: Insufficient documentation

## 2016-01-23 DIAGNOSIS — E1122 Type 2 diabetes mellitus with diabetic chronic kidney disease: Secondary | ICD-10-CM | POA: Diagnosis not present

## 2016-01-23 DIAGNOSIS — Z87891 Personal history of nicotine dependence: Secondary | ICD-10-CM | POA: Insufficient documentation

## 2016-01-23 DIAGNOSIS — E7800 Pure hypercholesterolemia, unspecified: Secondary | ICD-10-CM | POA: Diagnosis not present

## 2016-01-23 DIAGNOSIS — Z794 Long term (current) use of insulin: Secondary | ICD-10-CM | POA: Diagnosis not present

## 2016-01-23 DIAGNOSIS — Z79899 Other long term (current) drug therapy: Secondary | ICD-10-CM | POA: Insufficient documentation

## 2016-01-23 DIAGNOSIS — T82858A Stenosis of vascular prosthetic devices, implants and grafts, initial encounter: Secondary | ICD-10-CM | POA: Diagnosis present

## 2016-01-23 DIAGNOSIS — Y832 Surgical operation with anastomosis, bypass or graft as the cause of abnormal reaction of the patient, or of later complication, without mention of misadventure at the time of the procedure: Secondary | ICD-10-CM | POA: Insufficient documentation

## 2016-01-23 DIAGNOSIS — I132 Hypertensive heart and chronic kidney disease with heart failure and with stage 5 chronic kidney disease, or end stage renal disease: Secondary | ICD-10-CM | POA: Insufficient documentation

## 2016-01-23 HISTORY — PX: PERIPHERAL VASCULAR CATHETERIZATION: SHX172C

## 2016-01-23 LAB — GLUCOSE, CAPILLARY: Glucose-Capillary: 238 mg/dL — ABNORMAL HIGH (ref 65–99)

## 2016-01-23 SURGERY — DIALYSIS/PERMA CATHETER INSERTION
Anesthesia: Moderate Sedation

## 2016-01-23 MED ORDER — DEXTROSE 5 % IV SOLN
1.5000 g | INTRAVENOUS | Status: AC
  Administered 2016-01-23: 1.5 g via INTRAVENOUS

## 2016-01-23 MED ORDER — MORPHINE SULFATE (PF) 4 MG/ML IV SOLN: 2.0000 mg | INTRAVENOUS | Status: DC | PRN

## 2016-01-23 MED ORDER — HYDROMORPHONE HCL 1 MG/ML IJ SOLN: 1.0000 mg | Freq: Once | INTRAMUSCULAR | Status: DC

## 2016-01-23 MED ORDER — OXYCODONE-ACETAMINOPHEN 5-325 MG PO TABS: 1.0000 | ORAL_TABLET | ORAL | Status: DC | PRN

## 2016-01-23 MED ORDER — FENTANYL CITRATE (PF) 100 MCG/2ML IJ SOLN
INTRAMUSCULAR | Status: AC
  Filled 2016-01-23: qty 2

## 2016-01-23 MED ORDER — SODIUM CHLORIDE 0.9 % IV SOLN: 500.0000 mL | Freq: Once | INTRAVENOUS | Status: DC | PRN

## 2016-01-23 MED ORDER — LIDOCAINE-EPINEPHRINE (PF) 1 %-1:200000 IJ SOLN
INTRAMUSCULAR | Status: AC
  Filled 2016-01-23: qty 30

## 2016-01-23 MED ORDER — HEPARIN SODIUM (PORCINE) 1000 UNIT/ML IJ SOLN
INTRAMUSCULAR | Status: AC
  Filled 2016-01-23: qty 1

## 2016-01-23 MED ORDER — HEPARIN SODIUM (PORCINE) 1000 UNIT/ML IJ SOLN
INTRAMUSCULAR | Status: DC | PRN
  Administered 2016-01-23: 4000 [IU] via INTRAVENOUS

## 2016-01-23 MED ORDER — PHENOL 1.4 % MT LIQD: 1.0000 | OROMUCOSAL | Status: DC | PRN

## 2016-01-23 MED ORDER — MIDAZOLAM HCL 2 MG/2ML IJ SOLN
INTRAMUSCULAR | Status: DC | PRN
  Administered 2016-01-23: 2 mg via INTRAVENOUS

## 2016-01-23 MED ORDER — ALUM & MAG HYDROXIDE-SIMETH 200-200-20 MG/5ML PO SUSP: 15.0000 mL | ORAL | Status: DC | PRN

## 2016-01-23 MED ORDER — METOPROLOL TARTRATE 1 MG/ML IV SOLN: 2.0000 mg | INTRAVENOUS | Status: DC | PRN

## 2016-01-23 MED ORDER — ACETAMINOPHEN 325 MG RE SUPP: 325.0000 mg | RECTAL | Status: DC | PRN

## 2016-01-23 MED ORDER — LIDOCAINE-EPINEPHRINE (PF) 1 %-1:200000 IJ SOLN
INTRAMUSCULAR | Status: DC | PRN
  Administered 2016-01-23: 10 mL via INTRADERMAL

## 2016-01-23 MED ORDER — HEPARIN SODIUM (PORCINE) 10000 UNIT/ML IJ SOLN
INTRAMUSCULAR | Status: AC
  Filled 2016-01-23: qty 1

## 2016-01-23 MED ORDER — ONDANSETRON HCL 4 MG/2ML IJ SOLN: 4.0000 mg | Freq: Four times a day (QID) | INTRAMUSCULAR | Status: DC | PRN

## 2016-01-23 MED ORDER — LABETALOL HCL 5 MG/ML IV SOLN: 10.0000 mg | INTRAVENOUS | Status: DC | PRN

## 2016-01-23 MED ORDER — HEPARIN (PORCINE) IN NACL 2-0.9 UNIT/ML-% IJ SOLN
INTRAMUSCULAR | Status: AC
  Filled 2016-01-23: qty 500

## 2016-01-23 MED ORDER — IOHEXOL 300 MG/ML  SOLN
INTRAMUSCULAR | Status: DC | PRN
  Administered 2016-01-23: 50 mL via INTRA_ARTERIAL

## 2016-01-23 MED ORDER — MIDAZOLAM HCL 5 MG/5ML IJ SOLN
INTRAMUSCULAR | Status: AC
  Filled 2016-01-23: qty 5

## 2016-01-23 MED ORDER — SODIUM CHLORIDE 0.9 % IV SOLN: INTRAVENOUS | Status: DC

## 2016-01-23 MED ORDER — DEXTROSE 5 % IV SOLN
INTRAVENOUS | Status: AC
  Filled 2016-01-23 (×18): qty 1.5

## 2016-01-23 MED ORDER — GUAIFENESIN-DM 100-10 MG/5ML PO SYRP: 15.0000 mL | ORAL_SOLUTION | ORAL | Status: DC | PRN

## 2016-01-23 MED ORDER — HYDRALAZINE HCL 20 MG/ML IJ SOLN: 5.0000 mg | INTRAMUSCULAR | Status: DC | PRN

## 2016-01-23 MED ORDER — FENTANYL CITRATE (PF) 100 MCG/2ML IJ SOLN
INTRAMUSCULAR | Status: DC | PRN
  Administered 2016-01-23: 50 ug via INTRAVENOUS

## 2016-01-23 MED ORDER — ACETAMINOPHEN 325 MG PO TABS: 325.0000 mg | ORAL_TABLET | ORAL | Status: DC | PRN

## 2016-01-23 SURGICAL SUPPLY — 14 items
BALLN ARMADA 10X80X80 (BALLOONS) ×2
BALLN DORADO 10X40X80 (BALLOONS) ×2
BALLN DORADO 8X60X80 (BALLOONS) ×2
BALLOON ARMADA 10X80X80 (BALLOONS) ×1 IMPLANT
BALLOON DORADO 10X40X80 (BALLOONS) ×1 IMPLANT
BALLOON DORADO 8X60X80 (BALLOONS) ×1 IMPLANT
CANNULA 5F STIFF (CANNULA) ×2 IMPLANT
CATH TORCON 5FR 0.38 (CATHETERS) ×2 IMPLANT
DEVICE PRESTO INFLATION (MISCELLANEOUS) ×2 IMPLANT
DRAPE BRACHIAL (DRAPES) ×2 IMPLANT
PACK ANGIOGRAPHY (CUSTOM PROCEDURE TRAY) ×2 IMPLANT
SHEATH BRITE TIP 6FRX5.5 (SHEATH) ×2 IMPLANT
TOWEL OR 17X26 4PK STRL BLUE (TOWEL DISPOSABLE) ×2 IMPLANT
WIRE MAGIC TOR.035 180C (WIRE) ×2 IMPLANT

## 2016-01-23 NOTE — H&P (Signed)
  [facility] History & Physical Update  The patient was interviewed and re-examined.  The patient's previous History and Physical has been reviewed and is unchanged.  There is no change in the plan of care. We plan to proceed with the scheduled procedure.  DEW,JASON, MD  01/23/2016, 7:59 AM

## 2016-01-23 NOTE — Op Note (Signed)
[facility]    OPERATIVE NOTE   PROCEDURE: 1.   Left brachiocephalic arteriovenous fistula cannulation under ultrasound guidance 2.   Left arm fistulagram including central venogram 3.   Percutaneous transluminal angioplasty of the cephalic vein near the cephalic vein subclavian vein confluence with 8 mm diameter by 6 cm length angioplasty balloon 4.   Percutaneous transluminal angioplasty of the subclavian vein/innominate vein with 10 mm diameter conventional and high pressure angioplasty balloon  PRE-OPERATIVE DIAGNOSIS: 1. ESRD 2. 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 Both improved with angioplasty  SPECIMEN(S):  None  CONTRAST: 50 cc  FLUORO TIME: 3.6 minutes  MODERATE CONSCIOUS SEDATION TIME: Approximately 30 minutes with 2 mg of Versed and 50 mcg of Fentanyl   INDICATIONS: [name] is a 60 y.o. female who presents with malfunctioning  left brachiocephalic arteriovenous fistula with aneurysmal degeneration and prolonged bleeding.  The patient is scheduled for  left arm fistulagram. Pending the results of the fistulogram, she may require a PermCath and surgical revision. The patient is aware the risks include but are not limited to: bleeding, infection, thrombosis of the cannulated access, and possible anaphylactic reaction to the contrast.  The patient is aware of the risks of the procedure and elects to proceed forward.  DESCRIPTION: After full informed written consent was obtained, the patient was brought back to the angiography suite and placed supine upon the angiography table.   The patient was connected to monitoring equipment. Moderate conscious sedation was administered with a face to face encounter with the patient throughout the procedure with my supervision of the RN administering medicines and monitoring the patient's vital signs and mental status throughout from the start of the procedure until the patient was taken to the recovery room. The  left arm was prepped and draped in the standard fashion for a percutaneous access intervention.  Under ultrasound guidance, the  left brachiocephalic arteriovenous fistula was cannulated with a micropuncture needle under direct ultrasound guidance and a permanent image was performed.  The microwire was advanced into the fistula and the needle was exchanged for the a microsheath.  I then upsized to a 6 Fr Sheath and imaging was performed.  Hand injections were completed to image the access including the central venous system. This demonstrated moderate stenosis in the cephalic vein near the subclavian vein confluence of about 65-70% with a more significant stenosis in the subclavian/innominate vein of about 80%..  Based on the images, this patient will need intervention to these areas before a surgical bypass could be considered, and intervention to these areas could also depressurize the area and lessen the bleeding and aneurysmal degeneration.. I then gave the patient 4000 units of intravenous heparin.  I then crossed the stenoses with a Magic Tourqe wire.  Based on the imaging, a 8 mm x 6 cm  conventional angioplasty balloon was selected.  The balloon was centered around the cephalic vein stenosis near to the subclavian vein confluence and inflated to 10 ATM for 1 minute(s).  On completion imaging, a 20 % residual stenosis was present.   I then turned my attention to the more central stenosis which was separate  and distinct in the subclavian/innominate vein. I initially treated this with an 8 and then a 10 mm conventional angioplasty  balloon, but the waist never resolved even at burst pressures. I then exchanged for a 10 mm diameter by 4 cm length high pressure angioplasty balloon. This was inflated in the subclavian/innominate vein to 16 atm for 1 minute. Completion angiogram following this demonstrated about a 30% residual stenosis which was not flow limiting  Based on the completion imaging, no further intervention is necessary.  The wire and balloon were removed from the sheath.  A 4-0 Monocryl purse-string suture was sewn around the sheath.  The sheath was removed while tying down the suture.  A sterile bandage was applied to the puncture site.  COMPLICATIONS: None  CONDITION: Stable   DEW,JASON  01/23/2016 8:59 AM

## 2016-01-23 NOTE — Discharge Instructions (Signed)

## 2016-01-24 ENCOUNTER — Encounter: Payer: Self-pay | Admitting: Vascular Surgery

## 2016-01-25 ENCOUNTER — Other Ambulatory Visit: Payer: PRIVATE HEALTH INSURANCE

## 2016-02-02 ENCOUNTER — Encounter: Admission: RE | Payer: Self-pay | Source: Ambulatory Visit

## 2016-02-02 ENCOUNTER — Ambulatory Visit: Admission: RE | Admit: 2016-02-02 | Payer: Medicare Other | Source: Ambulatory Visit | Admitting: Vascular Surgery

## 2016-02-02 SURGERY — INSERTION OF ARTERIOVENOUS (AV) GORE-TEX GRAFT ARM
Anesthesia: General | Laterality: Left

## 2016-03-19 ENCOUNTER — Ambulatory Visit: Payer: Medicare Other | Admitting: Podiatry

## 2016-04-02 ENCOUNTER — Ambulatory Visit (INDEPENDENT_AMBULATORY_CARE_PROVIDER_SITE_OTHER): Payer: Medicare Other | Admitting: Podiatry

## 2016-04-02 ENCOUNTER — Encounter: Payer: Self-pay | Admitting: Podiatry

## 2016-04-02 DIAGNOSIS — M79676 Pain in unspecified toe(s): Secondary | ICD-10-CM | POA: Diagnosis not present

## 2016-04-02 DIAGNOSIS — L600 Ingrowing nail: Secondary | ICD-10-CM

## 2016-04-02 DIAGNOSIS — B351 Tinea unguium: Secondary | ICD-10-CM

## 2016-04-02 MED ORDER — NEOMYCIN-POLYMYXIN-HC 1 % OT SOLN: OTIC | Status: DC

## 2016-04-02 NOTE — Progress Notes (Signed)
She presents today with a chief complaint of painful elongated toenails as well as an ingrown toenail to the tibial border of the hallux left. Also concerned that there is a small superficial ulceration that usually drains that is now once again present.  Objective: Vital signs are stable alert and oriented 3. Pulses are palpable. Neurologic sensorium is intact. Muscle strength is normal. Small spicule of nail is located growing from the proximal medial nail fold. There is also a small ulceration measuring only approximately 2 mm in diameter. It appears that the to communicate with each other this may be what has been resulting in her very small abscesses for all of this time.    Assessment: Ingrown toenail with a small inclusion cysts and ulceration proximal medial border of the hallux left.  Plan: Chemical matrixectomy was performed today also removing all of the inclusion cyst that was communicating with the nail. All this was removed and flushed clean. She tolerated this procedure well after local anesthesia was administered. I will follow up with her in 1 week just for evaluation.

## 2016-04-02 NOTE — Patient Instructions (Signed)
Betadine Soak Instructions  Purchase an 8 oz. bottle of BETADINE solution (Povidone)  THE DAY AFTER THE PROCEDURE  Place 1 tablespoon of betadine solution in a quart of warm tap water.  Submerge your foot or feet with outer bandage intact for the initial soak; this will allow the bandage to become moist and wet for easy lift off.  Once you remove your bandage, continue to soak in the solution for 20 minutes.  This soak should be done twice a day.  Next, remove your foot or feet from solution, blot dry the affected area and cover.  You may use a band aid large enough to cover the area or use gauze and tape.  Apply other medications to the area as directed by the doctor such as cortisporin otic solution (ear drops) or neosporin.  IF YOUR SKIN BECOMES IRRITATED WHILE USING THESE INSTRUCTIONS, IT IS OKAY TO SWITCH TO EPSOM SALTS AND WATER OR WHITE VINEGAR AND WATER.   [facility] Instructions-Post Nail Surgery  You have had your ingrown toenail and root treated with a chemical.  This chemical causes a burn that will drain and ooze like a blister.  This can drain for 6-8 weeks or longer.  It is important to keep this area clean, covered, and follow the soaking instructions dispensed at the time of your surgery.  This area will eventually dry and form a scab.  Once the scab forms you no longer need to soak or apply a dressing.  If at any time you experience an increase in pain, redness, swelling, or drainage, you should contact the office as soon as possible.  

## 2016-04-09 ENCOUNTER — Encounter: Payer: Self-pay | Admitting: Podiatry

## 2016-04-09 ENCOUNTER — Ambulatory Visit (INDEPENDENT_AMBULATORY_CARE_PROVIDER_SITE_OTHER): Payer: Medicare Other | Admitting: Podiatry

## 2016-04-09 DIAGNOSIS — L600 Ingrowing nail: Secondary | ICD-10-CM

## 2016-04-11 NOTE — Progress Notes (Signed)
Present today for follow-up of matrixectomy hallux right.  Objective: Vital signs are stable alert and oriented 3. No signs of infection toe appears to be healing well.  Assessment: Well-healing surgical toe.  Plan: Redressed the wound today instructed her to discontinue Betadine sterile with Epsom salts and water soaks covered in the daytime and leave open at night. Continue soaks until completely resolved. Call or get in here with questions or concerns.

## 2016-04-20 ENCOUNTER — Ambulatory Visit (INDEPENDENT_AMBULATORY_CARE_PROVIDER_SITE_OTHER): Payer: Medicare Other | Admitting: *Deleted

## 2016-04-20 DIAGNOSIS — M2041 Other hammer toe(s) (acquired), right foot: Secondary | ICD-10-CM | POA: Diagnosis not present

## 2016-04-20 NOTE — Patient Instructions (Signed)
Diabetic Shoe and Insoles Instructions   Congratulations on receiving your new shoes and insoles. In accordance with Medicare regulations, they have been selected from our own inventory or have been special ordered to provide you with the optimum comfort and protection. In order to receive the greatest benefit from this footwear, please follow these suggested guidelines.   Initial use of your diabetic shoes It may take a couple of days for you to feel fully comfortable wearing your new diabetic shoes and insoles. Some people are immediately comfortable wearing them, while others need more time to adjust. Generally, the body does not adapt to change rapidly and you may experience mild aches or fatigue when you first begin to wear your shoes.  Wear these shoes as [place] as they are comfortable. Try to only wear them indoors until you feel that they are comfortable for outdoor use.  If they bother your feet, TAKE THEM OFF and try them again a few hours later or the next day. Check for any soreness or swelling and notify your doctor immediately if you notice any of these signs. Otherwise, increase wear time as they become more comfortable. If you feel like your shoes are bothersome still after trying these tips, please call our office. Keep in mind, that once the outer sole of the shoe becomes dirty, we can no longer send them back.  The insoles should be changed every 4 months and replaced with a new pair.   Maintenance of your new shoes Your new diabetic shoes can be washed with mild soap and water and air dried. Try to keep away from high heat sources (heaters, dryers, fireplaces, etc.) as this may damage or distort the plastazote lining and insert in the shoe. Do NOT machine wash or use harsh chemicals such as bleach on your shoes. These shoes, if properly taken care of, should last one year.   Continuing Care Diabetic patients have fewer foot complications if they have been properly fitted in the  correct type of footwear and accommodating insoles. The desired outcome is to fit you with the most appropriate, best fitting shoe possible in order to reduce the risk of and prevent foot complications, which could ultimately lead to ulceration, infection and amputation. Medicare will pay for one pair of extra depth shoes and 3 pairs of insoles per calendar year.   REMEMBER TO SEE YOUR PODIATRIST FOR REGULAR FOOT EXAMS AT LEAST ONCE PER YEAR.    

## 2016-04-20 NOTE — Progress Notes (Signed)
Dispensed diabetic shoes and 3 pairs of insoles. Instructions were reviewed and a copy was given to the patient. Patient to reappointment for regularly scheduled diabetic foot care visits or if she experiences any trouble with her diabetic shoes.

## 2016-05-07 ENCOUNTER — Ambulatory Visit (INDEPENDENT_AMBULATORY_CARE_PROVIDER_SITE_OTHER): Payer: Medicare Other | Admitting: Podiatry

## 2016-05-07 ENCOUNTER — Encounter: Payer: Self-pay | Admitting: Podiatry

## 2016-05-07 DIAGNOSIS — L600 Ingrowing nail: Secondary | ICD-10-CM

## 2016-05-07 NOTE — Progress Notes (Signed)
She presents today for follow-up of her matrixectomy for small spicule of nail there was possibly resulting in chronic infection hallux left. She states this seems to be doing much better she is very happy with the outcome thus far.  Objective: Vital signs are stable she's alert and oriented 3 hallux left demonstrates a small eschar overlying the proximal medial nail fold but no signs of purulence no signs of ischemia.  Assessment: Well-healing matrixectomy hallux left.  Plan: I encouraged her to follow up with me in August for routine nail debridement and should this redevelop or development pain or soreness she is to notify Z medially.

## 2016-05-14 ENCOUNTER — Emergency Department
Admission: EM | Admit: 2016-05-14 | Discharge: 2016-05-14 | Disposition: A | Discharge status: Home / Self Care | Payer: Medicare Other | Attending: Emergency Medicine | Admitting: Emergency Medicine

## 2016-05-14 ENCOUNTER — Encounter: Payer: Self-pay | Admitting: Emergency Medicine

## 2016-05-14 ENCOUNTER — Emergency Department: Payer: Medicare Other

## 2016-05-14 DIAGNOSIS — Z992 Dependence on renal dialysis: Secondary | ICD-10-CM | POA: Insufficient documentation

## 2016-05-14 DIAGNOSIS — E1122 Type 2 diabetes mellitus with diabetic chronic kidney disease: Secondary | ICD-10-CM | POA: Diagnosis not present

## 2016-05-14 DIAGNOSIS — M545 Low back pain, unspecified: Secondary | ICD-10-CM

## 2016-05-14 DIAGNOSIS — Z794 Long term (current) use of insulin: Secondary | ICD-10-CM | POA: Diagnosis not present

## 2016-05-14 DIAGNOSIS — I120 Hypertensive chronic kidney disease with stage 5 chronic kidney disease or end stage renal disease: Secondary | ICD-10-CM | POA: Insufficient documentation

## 2016-05-14 DIAGNOSIS — Z8673 Personal history of transient ischemic attack (TIA), and cerebral infarction without residual deficits: Secondary | ICD-10-CM | POA: Insufficient documentation

## 2016-05-14 DIAGNOSIS — W19XXXA Unspecified fall, initial encounter: Secondary | ICD-10-CM | POA: Diagnosis not present

## 2016-05-14 DIAGNOSIS — Z7951 Long term (current) use of inhaled steroids: Secondary | ICD-10-CM | POA: Insufficient documentation

## 2016-05-14 DIAGNOSIS — Z7982 Long term (current) use of aspirin: Secondary | ICD-10-CM | POA: Insufficient documentation

## 2016-05-14 DIAGNOSIS — Y929 Unspecified place or not applicable: Secondary | ICD-10-CM | POA: Insufficient documentation

## 2016-05-14 DIAGNOSIS — Y999 Unspecified external cause status: Secondary | ICD-10-CM | POA: Insufficient documentation

## 2016-05-14 DIAGNOSIS — N186 End stage renal disease: Secondary | ICD-10-CM | POA: Diagnosis not present

## 2016-05-14 DIAGNOSIS — Z79899 Other long term (current) drug therapy: Secondary | ICD-10-CM | POA: Insufficient documentation

## 2016-05-14 DIAGNOSIS — Y939 Activity, unspecified: Secondary | ICD-10-CM | POA: Insufficient documentation

## 2016-05-14 HISTORY — DX: Dependence on renal dialysis: Z99.2

## 2016-05-14 MED ORDER — METHOCARBAMOL 750 MG PO TABS: 750.0000 mg | ORAL_TABLET | Freq: Four times a day (QID) | ORAL | Status: DC

## 2016-05-14 MED ORDER — HYDROMORPHONE HCL 1 MG/ML IJ SOLN
1.0000 mg | Freq: Once | INTRAMUSCULAR | Status: AC
Start: 2016-05-14 — End: 2016-05-14
  Administered 2016-05-14: 1 mg via INTRAMUSCULAR
  Filled 2016-05-14: qty 1

## 2016-05-14 MED ORDER — METHOCARBAMOL 500 MG PO TABS
1000.0000 mg | ORAL_TABLET | Freq: Once | ORAL | Status: AC
  Administered 2016-05-14: 1000 mg via ORAL
  Filled 2016-05-14: qty 2

## 2016-05-14 MED ORDER — OXYCODONE-ACETAMINOPHEN 5-325 MG PO TABS: 1.0000 | ORAL_TABLET | ORAL | Status: DC | PRN

## 2016-05-14 NOTE — ED Notes (Signed)
Pt presents with lower back pain x 6 days after falling. Pt states she has had back problems in the past. Pt states pain is getting worse.

## 2016-05-14 NOTE — ED Notes (Signed)
Last Tuesday she fell going into house after going for dialysis.  She hit her back and her head.  She says pain is getting worse in righ lower back over time.

## 2016-05-14 NOTE — Discharge Instructions (Signed)

## 2016-05-14 NOTE — ED Provider Notes (Signed)
[facility] Emergency Department Provider Note   ____________________________________________  Time seen: Approximately 1:46 PM  I have reviewed the triage vital signs and the nursing notes.   HISTORY  Chief Complaint Back Pain and Fall    HPI [name] is a 60 y.o. female patient complain of low back pain and left hip pain secondary to a fall. Patient states she was going into a house after dialysis she had a fall. Patient states she remained on the floor until EMS arrived and evaluated her and she decided to come to the emergency room. Incident occurred 6 days ago. Patient states  continue to have pain which has increased in the past 2 days. Patient denies any bladder or bowel dysfunction.Patient denies any radicular component to her pain. Patient rates the pain as a 9/10. Patient described the pain as sharp. No palliative measures for her complaint.   Past Medical History  Diagnosis Date  . Diabetes mellitus without complication ([place])   . Chronic kidney disease   . Hypertension   . Gout   . Dialysis patient [facility])     Patient Active Problem List   Diagnosis Date Noted  . Cerebrovascular accident, old 06/21/2014  . CAFL (chronic airflow limitation) ([place]) 04/09/2014  . Gout 02/16/2014  . BP (high blood pressure) 02/16/2014  . Diabetes ([place]) 09/28/2013  . Dermatophytoses 09/28/2013  . End stage kidney disease ([place]) 09/28/2013  . Biliary calculi 05/03/2011  . Idiopathic localized osteoarthropathy 02/12/2011    Past Surgical History  Procedure Laterality Date  . Kidney surgery    . Peripheral vascular catheterization N/A 01/23/2016    Procedure: Dialysis/Perma Catheter Insertion;  Surgeon: Algernon Huxley, MD;  Location: [facility];  Service: Cardiovascular;  Laterality: N/A;    Current Outpatient Rx  Name  Route  Sig  Dispense  Refill  . albuterol (PROVENTIL HFA;VENTOLIN HFA) 108 (90 Base) MCG/ACT inhaler   Inhalation   Inhale 2  puffs into the lungs every 6 (six) hours as needed for wheezing or shortness of breath.         . allopurinol (ZYLOPRIM) 100 MG tablet   Oral   Take 100 mg by mouth 3 (three) times daily.         Marland Kitchen amitriptyline (ELAVIL) 25 MG tablet   Oral   Take 25 mg by mouth.         Marland Kitchen amLODipine (NORVASC) 10 MG tablet   Oral   Take 10 mg by mouth daily.         Marland Kitchen aspirin 81 MG tablet   Oral   Take 81 mg by mouth. Reported on 01/23/2016         . calcium carbonate (OS-CAL - DOSED IN MG OF ELEMENTAL CALCIUM) 1250 MG tablet   Oral   Take by mouth.         . cetirizine (ZYRTEC) 10 MG tablet   Oral   Take 10 mg by mouth. Reported on 01/23/2016         . cinacalcet (SENSIPAR) 30 MG tablet               . citalopram (CELEXA) 40 MG tablet   Oral   Take 10 mg by mouth.          . cloNIDine (CATAPRES) 0.1 MG tablet   Oral   Take 0.1 mg by mouth.         . clopidogrel (PLAVIX) 75 MG tablet   Oral  Take 75 mg by mouth.         . colchicine (COLCRYS) 0.6 MG tablet   Oral   Take 0.6 mg by mouth. Reported on 01/23/2016         . docusate sodium (STOOL SOFTENER) 100 MG capsule   Oral   Take 100 mg by mouth.         . ergocalciferol (DRISDOL) 50000 UNITS capsule   Oral   Take by mouth.         . ferrous sulfate 325 (65 FE) MG EC tablet   Oral   Take 325 mg by mouth.         . fluticasone (FLONASE) 50 MCG/ACT nasal spray      1 spray by Each Nare route daily.         . furosemide (LASIX) 80 MG tablet   Oral   Take 160 mg by mouth.         Marland Kitchen HYDROcodone-acetaminophen (NORCO/VICODIN) 5-325 MG tablet   Oral   Take 1 tablet by mouth 2 (two) times daily.         . hydrocortisone valerate ointment (WEST-CORT) 0.2 %      Reported on 01/23/2016         . insulin glargine (LANTUS) 100 UNIT/ML injection   Subcutaneous   Inject into the skin.         . Insulin Syringe-Needle U-100 (INSULIN SYRINGE 1CC/28G) 28G X 1/2" 1 ML MISC       Frequency:TID   Dosage:0.0     Instructions:  Note:Dose: 1         . labetalol (NORMODYNE) 200 MG tablet   Oral   Take 200 mg by mouth 2 (two) times daily.         Marland Kitchen lidocaine-prilocaine (EMLA) cream   Topical   Apply 1 application topically as needed (prior to dialysis).         Marland Kitchen losartan (COZAAR) 50 MG tablet   Oral   Take 50 mg by mouth.         . methocarbamol (ROBAXIN-750) 750 MG tablet   Oral   Take 1 tablet (750 mg total) by mouth 4 (four) times daily.   20 tablet   0   . metolazone (ZAROXOLYN) 5 MG tablet   Oral   Take 5 mg by mouth.         . Misc. Devices [facility]) MISC      Frequency:PHARMDIR   Dosage:0.0     Instructions:  Note:Dx: Deconditioning with orthostatic hypotension Dose: 1         . mupirocin ointment (BACTROBAN) 2 %      Apply to wound twice a day. Patient not taking: Reported on 01/23/2016   30 g   2   . NEOMYCIN-POLYMYXIN-HYDROCORTISONE (CORTISPORIN) 1 % SOLN otic solution      Apply 1-2 drops to toe BID after soaking   10 mL   1   . omega-3 acid ethyl esters (LOVAZA) 1 G capsule   Oral   Take by mouth.         Marland Kitchen omeprazole (PRILOSEC) 20 MG capsule   Oral   Take 20 mg by mouth. Reported on 01/23/2016         . oxyCODONE-acetaminophen (ROXICET) 5-325 MG tablet   Oral   Take 1 tablet by mouth every 4 (four) hours as needed for severe pain.   30 tablet   0   . Polyethylene Glycol  POWD   Oral   Take by mouth.         . polyethylene glycol powder (GLYCOLAX/MIRALAX) powder               . sevelamer (RENAGEL) 800 MG tablet   Oral   Take 800 mg by mouth.         . simvastatin (ZOCOR) 40 MG tablet   Oral   Take 40 mg by mouth.           Allergies Enalapril and Iodinated diagnostic agents  No family history on file.  Social History Social History  Substance Use Topics  . Smoking status: Never Smoker   . Smokeless tobacco: Never Used  . Alcohol Use: No    Review of Systems Constitutional: No  fever/chills Eyes: No visual changes. ENT: No sore throat. Cardiovascular: Denies chest pain. Respiratory: Denies shortness of breath. Gastrointestinal: No abdominal pain.  No nausea, no vomiting.  No diarrhea.  No constipation. Genitourinary: Negative for dysuria. Musculoskeletal: Negative for back pain. Skin: Negative for rash. Neurological: Negative for headaches, focal weakness or numbness. Endocrine:Diabetes, hypertension, end-stage renal disease, and gout. Allergic/Immunilogical: See medication list 10-point ROS otherwise negative.  ____________________________________________   PHYSICAL EXAM:  VITAL SIGNS: ED Triage Vitals  Enc Vitals Group     BP 05/14/16 1318 119/78 mmHg     Pulse Rate 05/14/16 1318 79     Resp 05/14/16 1318 20     Temp 05/14/16 1318 97.3 F (36.3 C)     Temp Source 05/14/16 1318 Oral     SpO2 05/14/16 1318 100 %     Weight 05/14/16 1318 288 lb (130.636 kg)     Height 05/14/16 1318 5\' 6"  (1.676 m)     Head Cir --      Peak Flow --      Pain Score 05/14/16 1318 9     Pain Loc --      Pain Edu? --      Excl. in [place]? --     Constitutional: Alert and oriented. Well appearing and in no acute distress.Morbid obesity Eyes: Conjunctivae are normal. PERRL. EOMI. Head: Atraumatic. Nose: No congestion/rhinnorhea. Mouth/Throat: Mucous membranes are moist.  Oropharynx non-erythematous. Neck: No stridor.  No cervical spine tenderness to palpation. Hematological/Lymphatic/Immunilogical: No cervical lymphadenopathy. Cardiovascular: Normal rate, regular rhythm. Grossly normal heart sounds.  Good peripheral circulation. Respiratory: Normal respiratory effort.  No retractions. Lungs CTAB. Gastrointestinal: Soft and nontender. No distention. No abdominal bruits. No CVA tenderness. Musculoskeletal: No lower extremity tenderness nor edema.  No joint effusions. Neurologic:  Normal speech and language. No gross focal neurologic deficits are appreciated. No gait  instability. Skin:  Skin is warm, dry and intact. No rash noted. Psychiatric: Mood and affect are normal. Speech and behavior are normal.  ____________________________________________   LABS (all labs ordered are listed, but only abnormal results are displayed)  Labs Reviewed - No data to display ____________________________________________  EKG   ____________________________________________  RADIOLOGY  No acute findings on x-ray of the lumbar spine. Patient has moderate to severe DJD and arthritic spurs. ____________________________________________   PROCEDURES  Procedure(s) performed: None  Critical Care performed: No  ____________________________________________   INITIAL IMPRESSION / ASSESSMENT AND PLAN / ED COURSE  Pertinent labs & imaging results that were available during my care of the patient were reviewed by me and considered in my medical decision making (see chart for details).  Low back pain secondary to fall. Discussed x-ray finding with patient. She given  discharge care instructions. Patient given prescription for tramadol and Robaxin. Patient advised to follow-up with family doctor for continued care for her condition persists. ____________________________________________   FINAL CLINICAL IMPRESSION(S) / ED DIAGNOSES  Final diagnoses:  Back pain at L4-L5 level      NEW MEDICATIONS STARTED DURING THIS VISIT:  New Prescriptions   METHOCARBAMOL (ROBAXIN-750) 750 MG TABLET    Take 1 tablet (750 mg total) by mouth 4 (four) times daily.   OXYCODONE-ACETAMINOPHEN (ROXICET) 5-325 MG TABLET    Take 1 tablet by mouth every 4 (four) hours as needed for severe pain.     Note:  This document was prepared using Dragon voice recognition software and may include unintentional dictation errors.    Sable Feil, PA-C 05/14/16 [place], MD 05/14/16 [phone]

## 2016-07-04 ENCOUNTER — Ambulatory Visit (INDEPENDENT_AMBULATORY_CARE_PROVIDER_SITE_OTHER): Payer: Medicare Other | Admitting: Podiatry

## 2016-07-04 DIAGNOSIS — M79676 Pain in unspecified toe(s): Secondary | ICD-10-CM | POA: Diagnosis not present

## 2016-07-04 DIAGNOSIS — B351 Tinea unguium: Secondary | ICD-10-CM | POA: Diagnosis not present

## 2016-07-04 NOTE — Progress Notes (Signed)
She presents today chief complaint of painful elongated toenails and reactive hyperkeratosis.  Objective: Vital signs are stable she is alert and oriented 3 no open lesions or wounds today. Toenails are thick yellow dystrophic onychomycotic. Porokeratotic lesions plantar aspect of the bilateral foot.  Assessment: Pain limp secondary to onychomycosis and porokeratosis.  Plan: Discussed etiology pathology conservative versus surgical therapies. Debrided toenails 1 through 5 bilateral. Debrided all reactive hyperkeratosis. Follow-up with her in 2-3 months.

## 2016-07-20 IMAGING — CR DG LUMBAR SPINE COMPLETE 4+V
1 series · 5 of 5 positions shown · non-contrast
Comparison: None.

CLINICAL DATA: Lumbago.  Fall 6 days prior

EXAM:
LUMBAR SPINE - COMPLETE 4+ VIEW

[Series 1: dg lumbar spine complete 4 +v · 0.14mm/px · 5 of 5 slices shown]
[im 1/5]
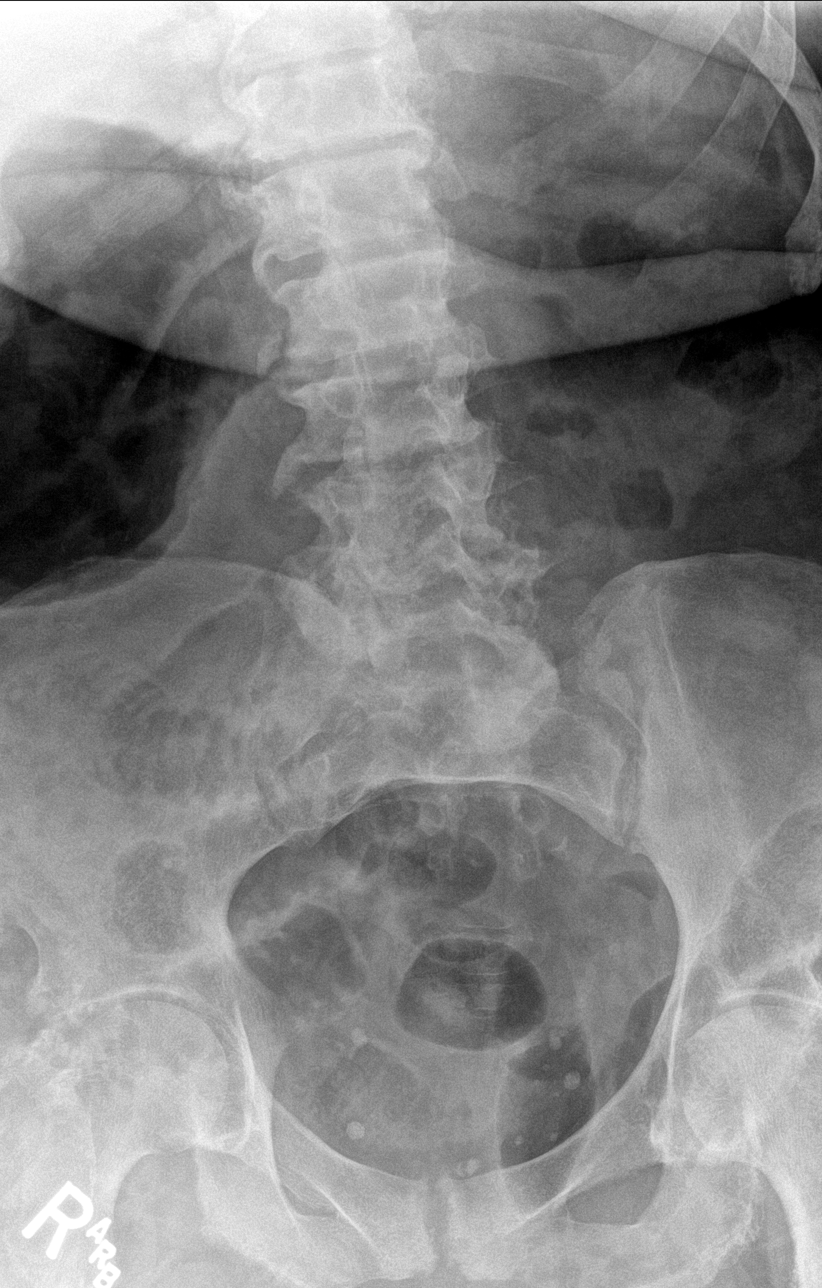
[im 2/5]
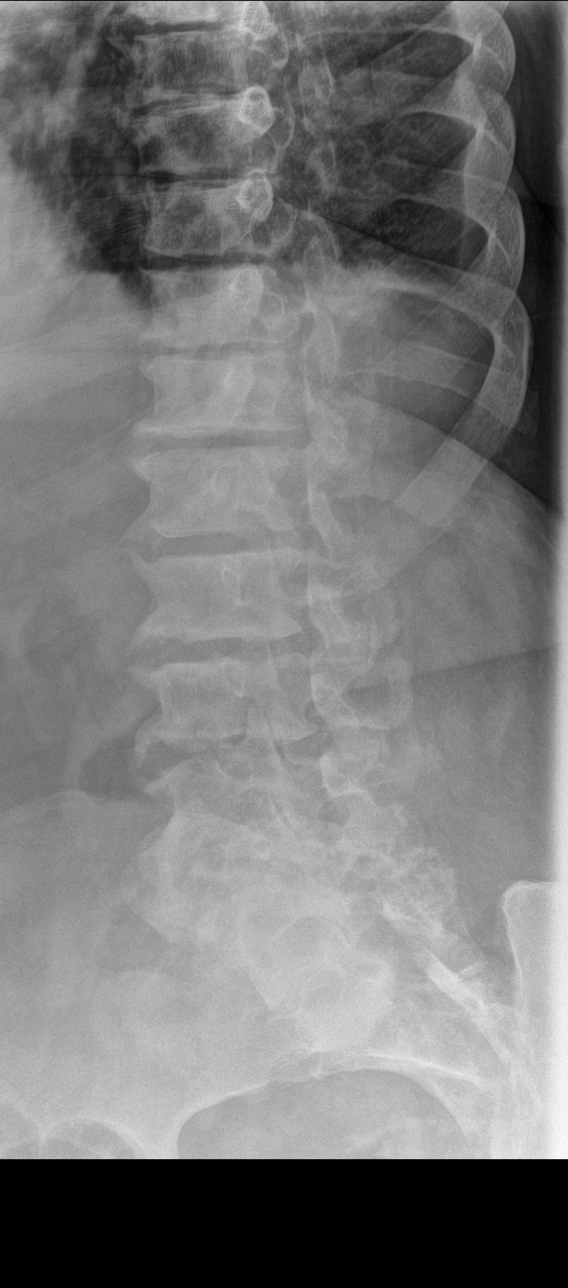
[im 3/5]
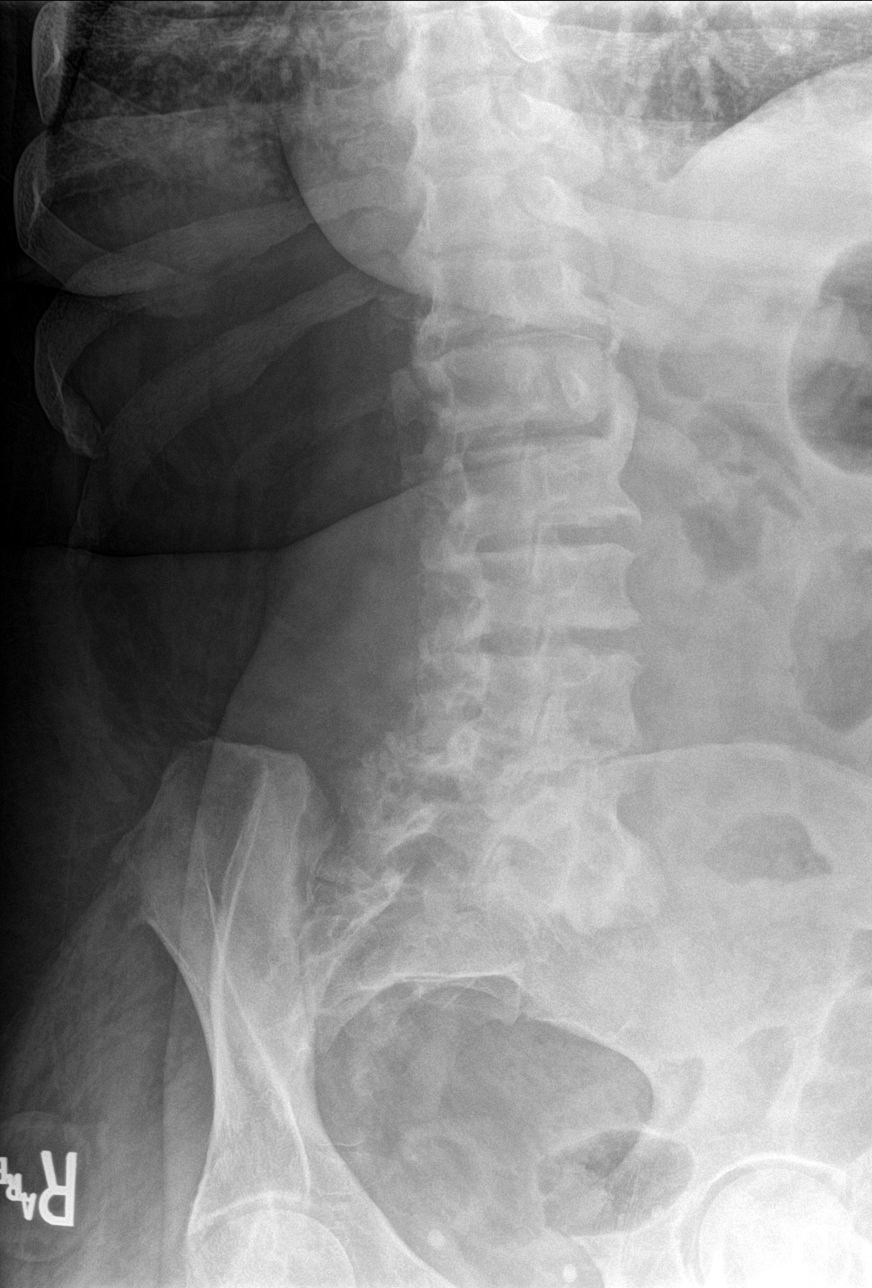
[im 4/5]
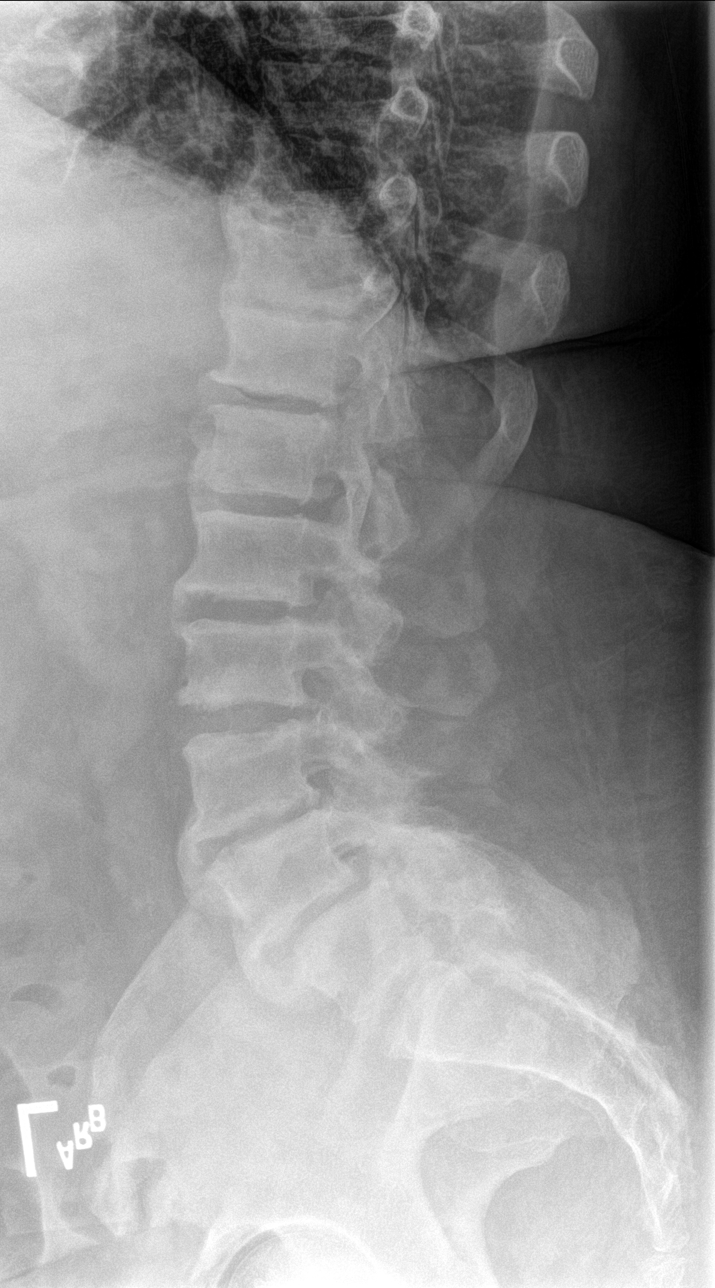
[im 5/5]
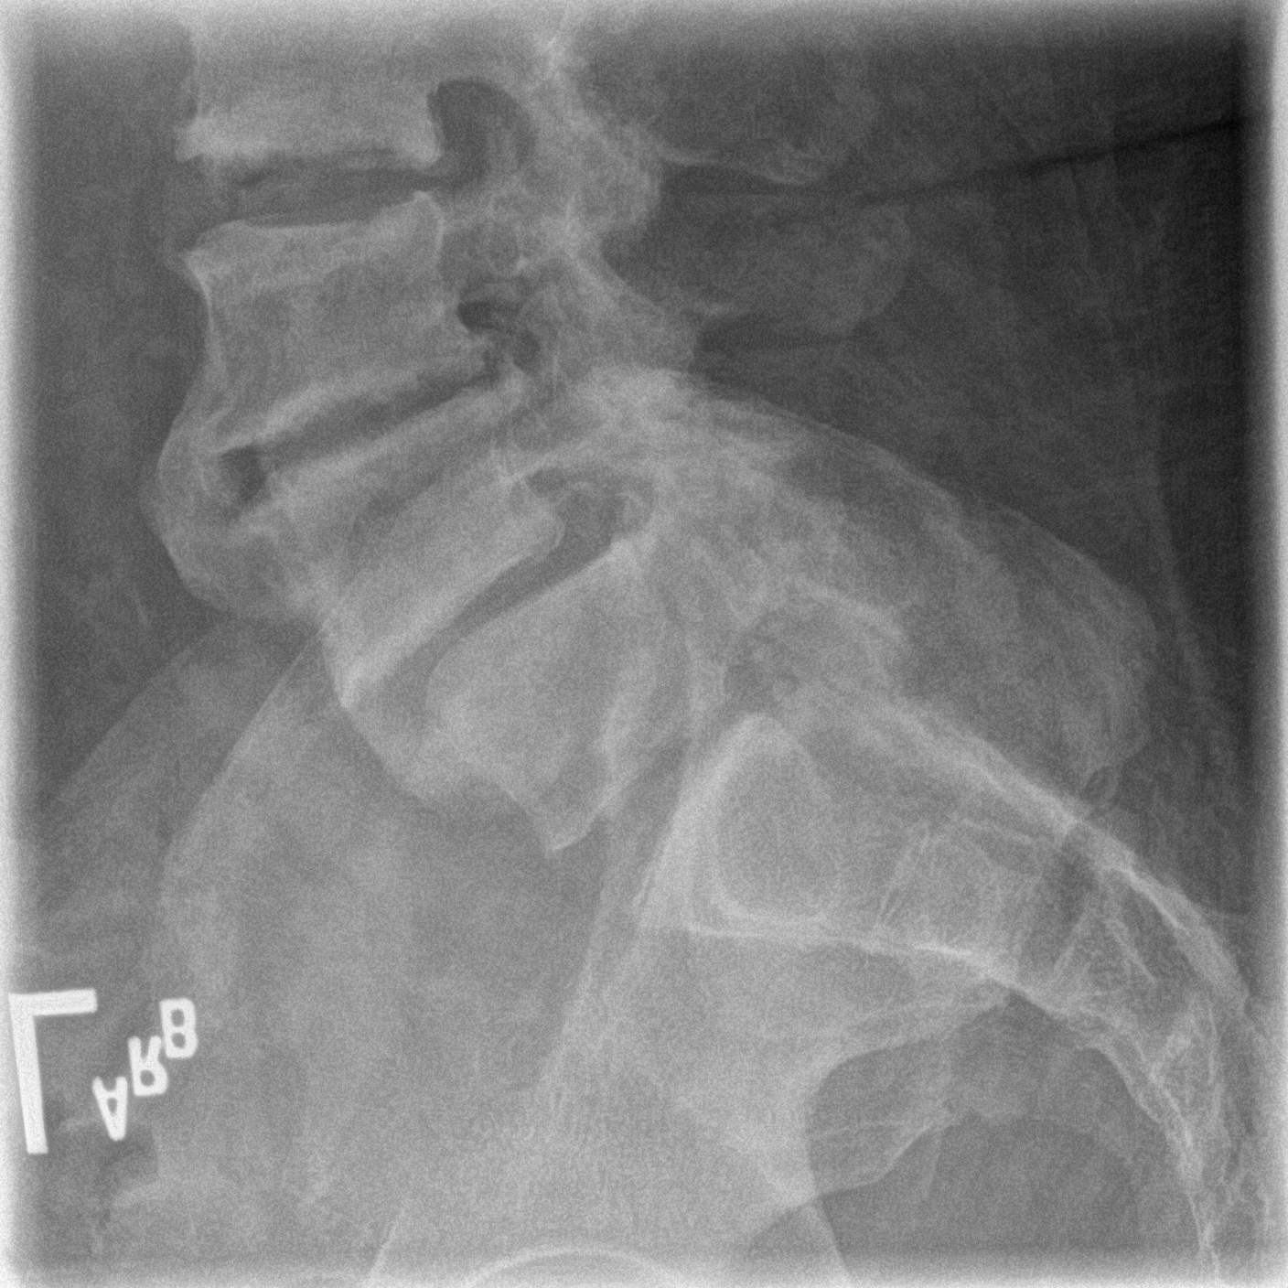

[5 of 5 positions shown; findings below may reference images not displayed]

FINDINGS: Frontal, lateral, spot lumbosacral lateral, and bilateral oblique
views were obtained. There are 5 non-rib-bearing lumbar type
vertebral bodies. There is no evident fracture. There is 6 mm of
anterolisthesis of L4 on L5. There is no other spondylolisthesis.
There is moderately severe disc space narrowing at L3-4, L4-5, and
L5-S1. There are prominent anterior osteophytes at all levels except
for L2-3. There is also moderate degenerative change in the
visualized lower thoracic region. There is facet osteoarthritic
change at L3-4, L4-5, and L5-S1 bilaterally.
IMPRESSION: Multilevel arthropathy. Spondylolisthesis at L4-5, likely due to
underlying spondylosis. No fracture evident.

## 2016-09-26 ENCOUNTER — Encounter (INDEPENDENT_AMBULATORY_CARE_PROVIDER_SITE_OTHER): Payer: Self-pay

## 2016-09-26 ENCOUNTER — Telehealth (INDEPENDENT_AMBULATORY_CARE_PROVIDER_SITE_OTHER): Payer: Self-pay

## 2016-09-26 ENCOUNTER — Other Ambulatory Visit (INDEPENDENT_AMBULATORY_CARE_PROVIDER_SITE_OTHER): Payer: Self-pay | Admitting: Vascular Surgery

## 2016-09-26 NOTE — Telephone Encounter (Signed)
A dye allergy cocktail was called into [place] [facility] for the patient. Prednisone 50 mg # 3 take 13hrs, 7hrs, and 1hr before procedure, Benadryl 25 mg #2 take one day before and one hour before procedure, Zantac 150 mg #1 take one hour before procedure.

## 2016-09-30 MED ORDER — DEXTROSE 5 % IV SOLN: 1.5000 g | INTRAVENOUS | Status: DC

## 2016-10-01 ENCOUNTER — Encounter
Admission: RE | Disposition: A | Discharge status: Home / Self Care | Payer: Self-pay | Source: Ambulatory Visit | Attending: Vascular Surgery

## 2016-10-01 ENCOUNTER — Ambulatory Visit
Admission: RE | Admit: 2016-10-01 | Discharge: 2016-10-01 | Disposition: A | Discharge status: Home / Self Care | Payer: Medicare Other | Source: Ambulatory Visit | Attending: Vascular Surgery | Admitting: Vascular Surgery

## 2016-10-01 DIAGNOSIS — Z888 Allergy status to other drugs, medicaments and biological substances status: Secondary | ICD-10-CM | POA: Diagnosis not present

## 2016-10-01 DIAGNOSIS — E1122 Type 2 diabetes mellitus with diabetic chronic kidney disease: Secondary | ICD-10-CM | POA: Diagnosis not present

## 2016-10-01 DIAGNOSIS — N186 End stage renal disease: Secondary | ICD-10-CM | POA: Diagnosis not present

## 2016-10-01 DIAGNOSIS — I10 Essential (primary) hypertension: Secondary | ICD-10-CM | POA: Diagnosis not present

## 2016-10-01 DIAGNOSIS — T82858A Stenosis of vascular prosthetic devices, implants and grafts, initial encounter: Secondary | ICD-10-CM | POA: Diagnosis not present

## 2016-10-01 DIAGNOSIS — Z91041 Radiographic dye allergy status: Secondary | ICD-10-CM | POA: Diagnosis not present

## 2016-10-01 DIAGNOSIS — I120 Hypertensive chronic kidney disease with stage 5 chronic kidney disease or end stage renal disease: Secondary | ICD-10-CM | POA: Insufficient documentation

## 2016-10-01 DIAGNOSIS — Z91013 Allergy to seafood: Secondary | ICD-10-CM | POA: Insufficient documentation

## 2016-10-01 DIAGNOSIS — Y832 Surgical operation with anastomosis, bypass or graft as the cause of abnormal reaction of the patient, or of later complication, without mention of misadventure at the time of the procedure: Secondary | ICD-10-CM | POA: Diagnosis not present

## 2016-10-01 DIAGNOSIS — Z992 Dependence on renal dialysis: Secondary | ICD-10-CM | POA: Diagnosis not present

## 2016-10-01 DIAGNOSIS — Z6841 Body Mass Index (BMI) 40.0 and over, adult: Secondary | ICD-10-CM | POA: Insufficient documentation

## 2016-10-01 DIAGNOSIS — M109 Gout, unspecified: Secondary | ICD-10-CM | POA: Insufficient documentation

## 2016-10-01 DIAGNOSIS — E119 Type 2 diabetes mellitus without complications: Secondary | ICD-10-CM | POA: Diagnosis not present

## 2016-10-01 HISTORY — PX: PERIPHERAL VASCULAR CATHETERIZATION: SHX172C

## 2016-10-01 LAB — POTASSIUM (ARMC VASCULAR LAB ONLY): Potassium (ARMC vascular lab): 4.5 (ref 3.5–5.1)

## 2016-10-01 SURGERY — A/V SHUNTOGRAM/FISTULAGRAM
Anesthesia: Moderate Sedation | Site: Arm Upper | Laterality: Left

## 2016-10-01 MED ORDER — FAMOTIDINE 20 MG PO TABS: 40.0000 mg | ORAL_TABLET | ORAL | Status: DC | PRN

## 2016-10-01 MED ORDER — HEPARIN SODIUM (PORCINE) 1000 UNIT/ML IJ SOLN
INTRAMUSCULAR | Status: AC
  Filled 2016-10-01: qty 1

## 2016-10-01 MED ORDER — HEPARIN SODIUM (PORCINE) 1000 UNIT/ML IJ SOLN
INTRAMUSCULAR | Status: DC | PRN
  Administered 2016-10-01: 3000 [IU] via INTRAVENOUS

## 2016-10-01 MED ORDER — HYDROMORPHONE HCL 1 MG/ML IJ SOLN: 1.0000 mg | Freq: Once | INTRAMUSCULAR | Status: DC

## 2016-10-01 MED ORDER — IOPAMIDOL (ISOVUE-300) INJECTION 61%
INTRAVENOUS | Status: DC | PRN
  Administered 2016-10-01: 38 mL via INTRAVENOUS

## 2016-10-01 MED ORDER — SODIUM CHLORIDE 0.9 % IV SOLN
INTRAVENOUS | Status: DC
  Administered 2016-10-01: 11:00:00 via INTRAVENOUS

## 2016-10-01 MED ORDER — MIDAZOLAM HCL 2 MG/2ML IJ SOLN
INTRAMUSCULAR | Status: AC
  Filled 2016-10-01: qty 2

## 2016-10-01 MED ORDER — ONDANSETRON HCL 4 MG/2ML IJ SOLN: 4.0000 mg | Freq: Four times a day (QID) | INTRAMUSCULAR | Status: DC | PRN

## 2016-10-01 MED ORDER — HEPARIN (PORCINE) IN NACL 2-0.9 UNIT/ML-% IJ SOLN
INTRAMUSCULAR | Status: AC
  Filled 2016-10-01: qty 1000

## 2016-10-01 MED ORDER — FENTANYL CITRATE (PF) 100 MCG/2ML IJ SOLN
INTRAMUSCULAR | Status: DC | PRN
Start: 2016-10-01 — End: 2016-10-01
  Administered 2016-10-01: 50 ug via INTRAVENOUS

## 2016-10-01 MED ORDER — METHYLPREDNISOLONE SODIUM SUCC 125 MG IJ SOLR: 125.0000 mg | INTRAMUSCULAR | Status: DC | PRN

## 2016-10-01 MED ORDER — MIDAZOLAM HCL 2 MG/2ML IJ SOLN
INTRAMUSCULAR | Status: DC | PRN
  Administered 2016-10-01: 2 mg via INTRAVENOUS

## 2016-10-01 MED ORDER — LIDOCAINE HCL (PF) 1 % IJ SOLN
INTRAMUSCULAR | Status: AC
  Filled 2016-10-01: qty 10

## 2016-10-01 MED ORDER — FENTANYL CITRATE (PF) 100 MCG/2ML IJ SOLN
INTRAMUSCULAR | Status: AC
  Filled 2016-10-01: qty 2

## 2016-10-01 SURGICAL SUPPLY — 14 items
BALLN ARMADA 12X80X80 (BALLOONS) ×2
BALLN DORADO 10X40X80 (BALLOONS) ×2
BALLN LUTONIX AV 8X60X75 (BALLOONS) ×2
BALLOON ARMADA 12X80X80 (BALLOONS) ×1 IMPLANT
BALLOON DORADO 10X40X80 (BALLOONS) ×1 IMPLANT
BALLOON LUTONIX AV 8X60X75 (BALLOONS) ×1 IMPLANT
CANNULA 5F STIFF (CANNULA) ×2 IMPLANT
CATH TORCON 5FR 0.38 (CATHETERS) ×2 IMPLANT
DEVICE PRESTO INFLATION (MISCELLANEOUS) ×2 IMPLANT
DRAPE BRACHIAL (DRAPES) ×2 IMPLANT
GLIDEWIRE ADV .035X180CM (WIRE) ×2 IMPLANT
PACK ANGIOGRAPHY (CUSTOM PROCEDURE TRAY) ×2 IMPLANT
SHEATH BRITE TIP 6FRX5.5 (SHEATH) ×2 IMPLANT
TOWEL OR 17X26 4PK STRL BLUE (TOWEL DISPOSABLE) ×2 IMPLANT

## 2016-10-01 NOTE — H&P (Signed)
[facility] Admission History & Physical  MRN : [id number]  [name] is a 60 y.o. (1956-02-21) female who presents with chief complaint of No chief complaint on file. Marland Kitchen  History of Present Illness: Patient is sent over from her dialysis access center for poorly functioning access. She feels well and has no specific complaints today. She has had multiple access issues in the past and a fistula requiring multiple previous interventions. She has been premedicated for her shellfish allergy.  Current Facility-Administered Medications  Medication Dose Route Frequency Provider Last Rate Last Dose  . 0.9 %  sodium chloride infusion   Intravenous Continuous Kimberly A Stegmayer, PA-C      . cefUROXime (ZINACEF) 1.5 g in dextrose 5 % 50 mL IVPB  1.5 g Intravenous 30 min Pre-Op Kimberly A Stegmayer, PA-C      . famotidine (PEPCID) tablet 40 mg  40 mg Oral PRN Janalyn Harder Stegmayer, PA-C      . HYDROmorphone (DILAUDID) injection 1 mg  1 mg Intravenous Once [employer], PA-C      . methylPREDNISolone sodium succinate (SOLU-MEDROL) 125 mg/2 mL injection 125 mg  125 mg Intravenous PRN Kimberly A Stegmayer, PA-C      . ondansetron (ZOFRAN) injection 4 mg  4 mg Intravenous Q6H PRN Sela Hua, PA-C        Past Medical History:  Diagnosis Date  . Chronic kidney disease   . Diabetes mellitus without complication ([place])   . Dialysis patient ([place])   . Gout   . Hypertension     Past Surgical History:  Procedure Laterality Date  . KIDNEY SURGERY    . PERIPHERAL VASCULAR CATHETERIZATION N/A 01/23/2016   Procedure: Dialysis/Perma Catheter Insertion;  Surgeon: Algernon Huxley, MD;  Location: [facility];  Service: Cardiovascular;  Laterality: N/A;    Social History Social History  Substance Use Topics  . Smoking status: Never Smoker  . Smokeless tobacco: Never Used  . Alcohol use No  No IV drug use  Family History No bleeding disorders, clotting  disorders, or autoimmune diseases  Allergies  Allergen Reactions  . Enalapril Other (See Comments)    Unknown reaction  . Iodinated Diagnostic Agents Other (See Comments)    Unknown reaction      REVIEW OF SYSTEMS (Negative unless checked)  Constitutional: [] Weight loss  [] Fever  [] Chills Cardiac: [] Chest pain   [] Chest pressure   [] Palpitations   [] Shortness of breath when laying flat   [] Shortness of breath at rest   [x] Shortness of breath with exertion. Vascular:  [] Pain in legs with walking   [] Pain in legs at rest   [] Pain in legs when laying flat   [] Claudication   [] Pain in feet when walking  [] Pain in feet at rest  [] Pain in feet when laying flat   [] History of DVT   [] Phlebitis   [] Swelling in legs   [] Varicose veins   [] Non-healing ulcers Pulmonary:   [] Uses home oxygen   [] Productive cough   [] Hemoptysis   [] Wheeze  [] COPD   [] Asthma Neurologic:  [] Dizziness  [] Blackouts   [] Seizures   [] History of stroke   [] History of TIA  [] Aphasia   [] Temporary blindness   [] Dysphagia   [] Weakness or numbness in arms   [] Weakness or numbness in legs Musculoskeletal:  [] Arthritis   [] Joint swelling   [] Joint pain   [] Low back pain Hematologic:  [] Easy bruising  [] Easy bleeding   [] Hypercoagulable state   [] Anemic  []   Hepatitis Gastrointestinal:  [] Blood in stool   [] Vomiting blood  [] Gastroesophageal reflux/heartburn   [] Difficulty swallowing. Genitourinary:  [x] Chronic kidney disease   [] Difficult urination  [] Frequent urination  [] Burning with urination   [] Blood in urine Skin:  [] Rashes   [] Ulcers   [] Wounds Psychological:  [] History of anxiety   []  History of major depression.  Physical Examination  Vitals:   10/01/16 1006  BP: (!) 143/92  Pulse: 93  Resp: 20  Temp: 98.3 F (36.8 C)  TempSrc: Oral  SpO2: 93%  Weight: 130.6 kg (288 lb)  Height: 5\' 6"  (1.676 m)   Body mass index is 46.48 kg/m. Gen: WD/WN, NAD. Morbidly obese Head: [place]/AT, No temporalis wasting. Prominent temp  pulse not noted. Ear/Nose/Throat: Hearing grossly intact, nares w/o erythema or drainage, oropharynx w/o Erythema/Exudate,  Eyes: Conjunctiva clear, sclera non-icteric Neck: Trachea midline.  No JVD.  Pulmonary:  Good air movement, respirations not labored, no use of accessory muscles.  Cardiac: RRR, normal S1, S2. Vascular: Thrill and bruit are present in the access Vessel Right Left  Radial Palpable Palpable                                   Gastrointestinal: soft, non-tender/non-distended. No guarding/reflex.  Musculoskeletal: M/S 5/5 throughout.  Extremities without ischemic changes.  No deformity or atrophy.  Neurologic: Sensation grossly intact in extremities.  Symmetrical.  Speech is fluent. Motor exam as listed above. Psychiatric: Judgment intact, Mood & affect appropriate for pt's clinical situation. Dermatologic: No rashes or ulcers noted.  No cellulitis or open wounds. Lymph : No Cervical, Axillary, or Inguinal lymphadenopathy.     CBC Lab Results  Component Value Date   WBC 11.2 (H) 03/28/2015   HGB 10.5 (L) 03/28/2015   HCT 31.9 (L) 03/28/2015   MCV 90 03/28/2015   PLT 302 03/28/2015    BMET    Component Value Date/Time   NA 129 (L) 03/31/2015 0420   K 4.2 03/31/2015 0420   CL 88 (L) 03/31/2015 0420   CO2 28 03/31/2015 0420   GLUCOSE 144 (H) 03/31/2015 0420   BUN 61 (H) 03/31/2015 0420   CREATININE 10.20 (H) 03/31/2015 0420   CALCIUM 7.0 (LL) 03/31/2015 0420   GFRNONAA 4 (L) 03/31/2015 0420   GFRAA 4 (L) 03/31/2015 0420   CrCl cannot be calculated (Patient's most recent lab result is older than the maximum 21 days allowed.).  COAG Lab Results  Component Value Date   INR 1.0 06/14/2014   INR 1.0 03/09/2013    Radiology No results found.    Assessment/Plan 1. Dysfunction of dialysis access. For fistulogram today. Multiple previous interventions and expect recurrent stenosis. 2. End-stage renal disease. For access evaluation as above. 3.  Hypertension. Stable on outpatient medications. 4. Diabetes. Stable on outpatient medications. 5. Morbid obesity.   Leotis Pain, MD  10/01/2016 10:22 AM

## 2016-10-01 NOTE — Op Note (Signed)
[facility]    OPERATIVE NOTE   PROCEDURE: 1.   Left brachiocephalic arteriovenous fistula cannulation under ultrasound guidance 2.   Left arm fistulagram including central venogram 3.   Percutaneous transluminal angioplasty of left subclavian vein and innominate vein with a 9 mm diameter Lutonix drug eluding balloon, 10 mm diameter high pressure balloon, and 12 mm diameter conventional angioplasty balloon  PRE-OPERATIVE DIAGNOSIS: 1. ESRD 2. Poorly functional , aneurysmal left brachiocephalic AVF  POST-OPERATIVE DIAGNOSIS: same as above   SURGEON: Leotis Pain, MD  ANESTHESIA: local with MCS  ESTIMATED BLOOD LOSS: 20 cc  FINDING(S): 1. Aneurysmal AV fistula which was patent until the subclavian vein where there was between an 80-90% stenosis with significant venous collaterals identified.  SPECIMEN(S):  None  CONTRAST: 38 cc  FLUORO TIME: 3 minutes  MODERATE CONSCIOUS SEDATION TIME: Approximately 25 minutes with 2 mg of Versed and 50 mcg of Fentanyl   INDICATIONS: [name] is a 60 y.o. female who presents with malfunctioning  aneurysmal left brachiocephalic arteriovenous fistula.  The patient is scheduled for  left arm fistulagram.  The patient is aware the risks include but are not limited to: bleeding, infection, thrombosis of the cannulated access, and possible anaphylactic reaction to the contrast.  The patient is aware of the risks of the procedure and elects to proceed forward.  DESCRIPTION: After full informed written consent was obtained, the patient was brought back to the angiography suite and placed supine upon the angiography table.  The patient was connected to monitoring equipment. Moderate conscious sedation was administered with a face to face encounter with the patient throughout the procedure with my supervision of the RN administering medicines and monitoring the patient's vital signs and mental status throughout from the start of  the procedure until the patient was taken to the recovery room. The  left arm was prepped and draped in the standard fashion for a percutaneous access intervention.  Under ultrasound guidance, the  left brachiocephalic arteriovenous fistula was cannulated with a micropuncture needle under direct ultrasound guidance and a permanent image was performed.  The microwire was advanced into the fistula and the needle was exchanged for the a microsheath.  I then upsized to a 6 Fr Sheath and imaging was performed.  Hand injections were completed to image the access including the central venous system. This demonstrated an aneurysmal AV fistula which was patent until the subclavian vein where there was between an 80-90% stenosis with significant venous collaterals identified..  Based on the images, this patient will need intervention to her central venous stenosis. I then gave the patient 3000 units of intravenous heparin. I then crossed the stenosis with a Terumo advantage wire.  Based on the imaging, a 9 mm x 6 cm  Lutonix drug-coated angioplasty balloon was selected.  The balloon was centered around the subclavian vein stenosis and inflated to 12 ATM for 1 minute(s).  This was slightly undersized, and I used a 12 mm diameter by 8 cm length conventional angioplasty balloon. This was slightly oversized, and did not break the waist at maximal inflation. With this, I selected a 10 mm diameter by 4 cm length high pressure angioplasty balloon. This was inflated to 28 atm for 1 minute. On completion imaging, a 30-40 % residual stenosis was present but the prominent collaterals were not seen nearly as much.     Based on the completion imaging, no further intervention is necessary.  The wire and balloon were removed from the  sheath.  A 4-0 Monocryl purse-string suture was sewn around the sheath.  The sheath was removed while tying down the suture.  A sterile bandage was applied to the puncture site.  COMPLICATIONS:  None  CONDITION: Stable   Leotis Pain  10/01/2016 11:36 AM   This note was created with Dragon Medical transcription system. Any errors in dictation are purely unintentional.

## 2016-10-01 NOTE — Discharge Instructions (Signed)
Fistulogram, Care After °Refer to this sheet in the next few weeks. These instructions provide you with information on caring for yourself after your procedure. Your health care provider may also give you more specific instructions. Your treatment has been planned according to current medical practices, but problems sometimes occur. Call your health care provider if you have any problems or questions after your procedure. °WHAT TO EXPECT AFTER THE PROCEDURE °After your procedure, it is typical to have the following: °· A small amount of discomfort in the area where the catheters were placed. °· A small amount of bruising around the fistula. °· Sleepiness and fatigue. °HOME CARE INSTRUCTIONS °· Rest at home for the day following your procedure. °· Do not drive or operate heavy machinery while taking pain medicine. °· Take medicines only as directed by your health care provider. °· Do not take baths, swim, or use a hot tub until your health care provider approves. You may shower 24 hours after the procedure or as directed by your health care provider. °· There are many different ways to close and cover an incision, including stitches, skin glue, and adhesive strips. Follow your health care provider's instructions on: °¨ Incision care. °¨ Bandage (dressing) changes and removal. °¨ Incision closure removal. °· Monitor your dialysis fistula carefully. °SEEK MEDICAL CARE IF: °· You have drainage, redness, swelling, or pain at your catheter site. °· You have a fever. °· You have chills. °SEEK IMMEDIATE MEDICAL CARE IF: °· You feel weak. °· You have trouble balancing. °· You have trouble moving your arms or legs. °· You have problems with your speech or vision. °· You can no longer feel a vibration or buzz when you put your fingers over your dialysis fistula. °· The limb that was used for the procedure: °¨ Swells. °¨ Is painful. °¨ Is cold. °¨ Is discolored, such as blue or pale white. °  °This information is not intended  to replace advice given to you by your health care provider. Make sure you discuss any questions you have with your health care provider. °  °Document Released: 04/05/2014 Document Reviewed: 04/05/2014 °Elsevier Interactive Patient Education ©2016 [employer] ° °

## 2016-10-02 ENCOUNTER — Encounter: Payer: Self-pay | Admitting: Vascular Surgery

## 2016-10-08 ENCOUNTER — Ambulatory Visit (INDEPENDENT_AMBULATORY_CARE_PROVIDER_SITE_OTHER): Payer: Medicare Other | Admitting: Podiatry

## 2016-10-08 VITALS — BP 171/80 | HR 85 | Resp 16

## 2016-10-08 DIAGNOSIS — B351 Tinea unguium: Secondary | ICD-10-CM

## 2016-10-08 DIAGNOSIS — M79676 Pain in unspecified toe(s): Secondary | ICD-10-CM | POA: Diagnosis not present

## 2016-10-08 NOTE — Progress Notes (Signed)
She presents today with chief complaint of painful elongated toenails.  Objective: Vital signs are stable alert and oriented 3. Pulses are strongly palpable. Neurologic sensorium is intact. Tendon reflexes are intact. Muscle strength is normal bilateral. Toenails are thick yellow dystrophic onychomycotic.  Assessment: Diabetes mellitus with painless segment onychomycosis.  Plan: Debridement of toenails 1 through 5 bilateral. Follow-up with me in 3 months

## 2016-12-10 ENCOUNTER — Other Ambulatory Visit (INDEPENDENT_AMBULATORY_CARE_PROVIDER_SITE_OTHER): Payer: Self-pay | Admitting: Vascular Surgery

## 2016-12-10 ENCOUNTER — Ambulatory Visit (INDEPENDENT_AMBULATORY_CARE_PROVIDER_SITE_OTHER): Payer: Medicare Other

## 2016-12-10 DIAGNOSIS — N186 End stage renal disease: Secondary | ICD-10-CM

## 2016-12-10 DIAGNOSIS — Z992 Dependence on renal dialysis: Secondary | ICD-10-CM

## 2016-12-10 DIAGNOSIS — T829XXA Unspecified complication of cardiac and vascular prosthetic device, implant and graft, initial encounter: Secondary | ICD-10-CM

## 2016-12-14 ENCOUNTER — Ambulatory Visit (INDEPENDENT_AMBULATORY_CARE_PROVIDER_SITE_OTHER): Payer: Medicare Other | Admitting: Vascular Surgery

## 2016-12-14 ENCOUNTER — Encounter (INDEPENDENT_AMBULATORY_CARE_PROVIDER_SITE_OTHER): Payer: Self-pay | Admitting: Vascular Surgery

## 2016-12-14 ENCOUNTER — Encounter (INDEPENDENT_AMBULATORY_CARE_PROVIDER_SITE_OTHER): Payer: Medicare Other

## 2016-12-14 ENCOUNTER — Encounter (INDEPENDENT_AMBULATORY_CARE_PROVIDER_SITE_OTHER): Payer: Self-pay

## 2016-12-14 VITALS — BP 132/65 | HR 91 | Resp 17 | Wt 285.0 lb

## 2016-12-14 DIAGNOSIS — I10 Essential (primary) hypertension: Secondary | ICD-10-CM

## 2016-12-14 DIAGNOSIS — E1122 Type 2 diabetes mellitus with diabetic chronic kidney disease: Secondary | ICD-10-CM | POA: Diagnosis not present

## 2016-12-14 DIAGNOSIS — Z992 Dependence on renal dialysis: Secondary | ICD-10-CM | POA: Diagnosis not present

## 2016-12-14 DIAGNOSIS — N186 End stage renal disease: Secondary | ICD-10-CM | POA: Diagnosis not present

## 2016-12-14 NOTE — Assessment & Plan Note (Signed)
blood pressure control important in reducing the progression of atherosclerotic disease. On appropriate oral medications.  

## 2016-12-14 NOTE — Assessment & Plan Note (Signed)
Her fistula is currently working reasonably well. Duplex today shows moderately elevated velocities at the cephalic vein subclavian vein confluence consistent with a moderate degree of stenosis. The fistula is otherwise patent. This is reasonably stable and I do not think that any intervention at this point is necessary. She artery has an appointment for about 3 months for follow-up duplex which is reasonable. We did discuss that at some point she will likely require revision for the aneurysmal fistula, but until there is more skin threat or bleeding I think we can hold off on that.

## 2016-12-14 NOTE — Progress Notes (Signed)
MRN : [id number]  [name] is a 61 y.o. (06/14/1956) female who presents with chief complaint of  Chief Complaint  Patient presents with  . Follow-up  .  History of Present Illness: Patient returns today in follow up of Her dialysis access. She has a markedly aneurysmal left brachiocephalic AV fistula that we have performed multiple previous interventions on. We performed a central intervention on her about 2 months ago. Overall, her fistula has worked reasonably well since that time. She does have some thinning of the skin directly overlying the aneurysmal portions of the fistula but no overt bleeding and no wounds. Duplex today shows moderately elevated velocities at the cephalic vein subclavian vein confluence consistent with a moderate degree of stenosis. The fistula is otherwise patent.  Current Outpatient Prescriptions  Medication Sig Dispense Refill  . albuterol (PROVENTIL HFA;VENTOLIN HFA) 108 (90 Base) MCG/ACT inhaler Inhale 2 puffs into the lungs every 6 (six) hours as needed for wheezing or shortness of breath.    . allopurinol (ZYLOPRIM) 100 MG tablet Take 150 mg by mouth Every Tuesday,Thursday,and Saturday with dialysis.     Marland Kitchen amitriptyline (ELAVIL) 25 MG tablet Take 25 mg by mouth at bedtime.     Marland Kitchen amLODipine (NORVASC) 10 MG tablet Take 10 mg by mouth daily.    Marland Kitchen aspirin 81 MG tablet Take 81 mg by mouth. Reported on 01/23/2016    . atorvastatin (LIPITOR) 40 MG tablet Take 40 mg by mouth daily at 12 noon.    . carboxymethylcellulose (REFRESH PLUS) 0.5 % SOLN Place 1-2 drops into both eyes 3 (three) times daily as needed (for dry eyes).    . citalopram (CELEXA) 40 MG tablet Take 10 mg by mouth.     . clopidogrel (PLAVIX) 75 MG tablet Take 75 mg by mouth.    . docusate sodium (STOOL SOFTENER) 100 MG capsule Take 200 mg by mouth daily.     . fluticasone (FLONASE) 50 MCG/ACT nasal spray Place 2 sprays into both nostrils daily.     . GNP ALLERGY 25 MG tablet Take 25 mg by  mouth as directed.  TAKE 1 TABLET (25 MG) BY MOUTH DAY BEFORE PROCEDURE AND 1 TABLET (25 MG) 1 HOUR PRIOR TO PROCEDURE  0  . hydroxypropyl methylcellulose / hypromellose (ISOPTO TEARS / GONIOVISC) 2.5 % ophthalmic solution Place 1-2 drops into both eyes 4 (four) times daily as needed for dry eyes.    . insulin glargine (LANTUS) 100 UNIT/ML injection Inject 18 Units into the skin daily.     Marland Kitchen lidocaine-prilocaine (EMLA) cream Apply 1 application topically as needed (prior to dialysis).    . Misc. Devices [facility]) MISC Frequency:PHARMDIR   Dosage:0.0     Instructions:  Note:Dx: Deconditioning with orthostatic hypotension Dose: 1    . mupirocin ointment (BACTROBAN) 2 % Apply to wound twice a day. (Patient taking differently: Apply 1 application topically 2 (two) times daily. Apply to wound twice a day.) 30 g 2  . NEOMYCIN-POLYMYXIN-HYDROCORTISONE (CORTISPORIN) 1 % SOLN otic solution Apply 1-2 drops to toe BID after soaking (Patient taking differently: 1-2 drops 2 (two) times daily. Apply 1-2 drops to toe BID after soaking) 10 mL 1  . nystatin (MYCOSTATIN/NYSTOP) powder Apply 1 g topically daily. After bathing    . omega-3 acid ethyl esters (LOVAZA) 1 G capsule Take 1 g by mouth 2 (two) times daily.     . polyethylene glycol powder (GLYCOLAX/MIRALAX) powder Take 17 g by mouth daily. With coffee    .  predniSONE (DELTASONE) 50 MG tablet Take 50 mg by mouth as directed.  Take 1 tab 13 hours before procedure, 1 TAB 7 HOURS PRIOR, THEN 1 TAB 1 HOUR PRIOR TO PROCEDURE  0  . ranitidine (ZANTAC) 150 MG tablet Take 150 mg by mouth as directed. TAKE 1 TABLET BY MOUTH 1 HOUR PRIOR TO PROCEDURE  0  . sevelamer (RENAGEL) 800 MG tablet Take 800 mg by mouth 3 (three) times daily.     Marland Kitchen VICTOZA 18 MG/3ML SOPN Inject 18 mg into the skin daily.    . VOLTAREN 1 % GEL Apply 1 application topically 2 (two) times daily.     No current facility-administered medications for this visit.     Past Medical History:    Diagnosis Date  . Chronic kidney disease   . Diabetes mellitus without complication ([place])   . Dialysis patient ([place])   . Gout   . Hypertension     Past Surgical History:  Procedure Laterality Date  . KIDNEY SURGERY    . PERIPHERAL VASCULAR CATHETERIZATION N/A 01/23/2016   Procedure: Dialysis/Perma Catheter Insertion;  Surgeon: Algernon Huxley, MD;  Location: [place] CV [facility];  Service: Cardiovascular;  Laterality: N/A;  . PERIPHERAL VASCULAR CATHETERIZATION Left 10/01/2016   Procedure: A/V Shuntogram/Fistulagram;  Surgeon: Algernon Huxley, MD;  Location: [place] CV LAB;  Service: Cardiovascular;  Laterality: Left;    Social History Social History  Substance Use Topics  . Smoking status: Never Smoker  . Smokeless tobacco: Never Used  . Alcohol use No  married, husband accompanies today  Family History No bleeding or clotting disorders  Allergies  Allergen Reactions  . Enalapril Other (See Comments)    Unknown reaction  . Iodinated Diagnostic Agents Other (See Comments)    Unknown reaction      REVIEW OF SYSTEMS (Negative unless checked)  Constitutional: [] Weight loss  [] Fever  [] Chills Cardiac: [] Chest pain   [] Chest pressure   [] Palpitations   [] Shortness of breath when laying flat   [] Shortness of breath at rest   [x] Shortness of breath with exertion. Vascular:  [] Pain in legs with walking   [] Pain in legs at rest   [] Pain in legs when laying flat   [] Claudication   [] Pain in feet when walking  [] Pain in feet at rest  [] Pain in feet when laying flat   [] History of DVT   [] Phlebitis   [x] Swelling in legs   [] Varicose veins   [] Non-healing ulcers Pulmonary:   [] Uses home oxygen   [] Productive cough   [] Hemoptysis   [] Wheeze  [] COPD   [] Asthma Neurologic:  [] Dizziness  [] Blackouts   [] Seizures   [] History of stroke   [] History of TIA  [] Aphasia   [] Temporary blindness   [] Dysphagia   [] Weakness or numbness in arms   [] Weakness or numbness in legs Musculoskeletal:   [] Arthritis   [] Joint swelling   [] Joint pain   [] Low back pain Hematologic:  [] Easy bruising  [] Easy bleeding   [] Hypercoagulable state   [] Anemic   Gastrointestinal:  [] Blood in stool   [] Vomiting blood  [] Gastroesophageal reflux/heartburn   [] Abdominal pain Genitourinary:  [x] Chronic kidney disease   [] Difficult urination  [] Frequent urination  [] Burning with urination   [] Hematuria Skin:  [] Rashes   [] Ulcers   [] Wounds Psychological:  [] History of anxiety   []  History of major depression.  Physical Examination  BP 132/65   Pulse 91   Resp 17   Wt 285 lb (129.3 kg)   BMI 46.00  kg/m  Gen:  WD/WN, NAD Head: [place]/AT, No temporalis wasting. Ear/Nose/Throat: Hearing grossly intact, nares w/o erythema or drainage, trachea midline Eyes: Conjunctiva clear. Sclera non-icteric Neck: Supple.  No JVD.  Pulmonary:  Good air movement, no use of accessory muscles.  Cardiac: RRR, normal S1, S2 Vascular: Good thrill and bruit are present in left brachiocephalic AV fistula. Skin overlying the aneurysmal segments is thin but intact. No ulceration or skin breakdown. No signs of infection.  Vessel Right Left  Radial Palpable Palpable                                   Gastrointestinal: soft, non-tender/non-distended. No guarding/reflex.  Musculoskeletal: M/S 5/5 throughout.  No deformity or atrophy.  Neurologic: Sensation grossly intact in extremities.  Symmetrical.  Speech is fluent.  Psychiatric: Judgment intact, Mood & affect appropriate for pt's clinical situation. Dermatologic: No rashes or ulcers noted.  No cellulitis or open wounds. Lymph : No Cervical, Axillary, or Inguinal lymphadenopathy.      Labs Recent Results (from the past 2160 hour(s))  Potassium [facility] vascular lab only)     Status: None   Collection Time: 10/01/16 10:19 AM  Result Value Ref Range   Potassium [facility] vascular lab) 4.5 3.5 - 5.1    Radiology No results found.    Assessment/Plan  BP (high blood  pressure) blood pressure control important in reducing the progression of atherosclerotic disease. On appropriate oral medications.   End stage kidney disease Her fistula is currently working reasonably well. Duplex today shows moderately elevated velocities at the cephalic vein subclavian vein confluence consistent with a moderate degree of stenosis. The fistula is otherwise patent. This is reasonably stable and I do not think that any intervention at this point is necessary. She artery has an appointment for about 3 months for follow-up duplex which is reasonable. We did discuss that at some point she will likely require revision for the aneurysmal fistula, but until there is more skin threat or bleeding I think we can hold off on that.    Leotis Pain, MD  12/14/2016 1:19 PM    This note was created with Dragon medical transcription system.  Any errors from dictation are purely unintentional

## 2017-01-09 ENCOUNTER — Ambulatory Visit: Payer: Medicare Other | Admitting: Podiatry

## 2017-01-14 ENCOUNTER — Encounter: Payer: Self-pay | Admitting: Podiatry

## 2017-01-14 ENCOUNTER — Ambulatory Visit (INDEPENDENT_AMBULATORY_CARE_PROVIDER_SITE_OTHER): Payer: Medicare Other | Admitting: Podiatry

## 2017-01-14 DIAGNOSIS — B351 Tinea unguium: Secondary | ICD-10-CM | POA: Diagnosis not present

## 2017-01-14 DIAGNOSIS — M79676 Pain in unspecified toe(s): Secondary | ICD-10-CM | POA: Diagnosis not present

## 2017-01-14 NOTE — Progress Notes (Signed)
Complaint:  Visit Type: Patient returns to my office for continued preventative foot care services. Complaint: Patient states" my nails have grown [place] and thick and become painful to walk and wear shoes".  Patient has also developed a painful area on her left big toe following previous nail surgery. Patient has been diagnosed with DM with no foot complications. The patient presents for preventative foot care services. No changes to ROS  Podiatric Exam: Vascular: dorsalis pedis and posterior tibial pulses are palpable bilateral. Capillary return is immediate. Temperature gradient is WNL. Skin turgor WNL  Sensorium: Normal Semmes Weinstein monofilament test. Normal tactile sensation bilaterally. Nail Exam: Pt has thick disfigured discolored nails with subungual debris noted bilateral entire nail hallux through fifth toenails except left hallux..  Skin lesion has developed on left nail bed with no infection noted. Ulcer Exam: There is no evidence of ulcer or pre-ulcerative changes or infection. Orthopedic Exam: Muscle tone and strength are WNL. No limitations in general ROM. No crepitus or effusions noted. Foot type and digits show no abnormalities. Bony prominences are unremarkable. Skin: No Porokeratosis. No infection or ulcers  Diagnosis:  Onychomycosis, , Pain in right toe, pain in left toes  Treatment & Plan Procedures and Treatment: Consent by patient was obtained for treatment procedures. The patient understood the discussion of treatment and procedures well. All questions were answered thoroughly reviewed. Debridement of mycotic and hypertrophic toenails, 1 through 5 bilateral and clearing of subungual debris. No ulceration, no infection noted.  Return Visit-Office Procedure: Patient instructed to return to the office for a follow up visit 3 months for continued evaluation and treatment.    Gardiner Barefoot DPM

## 2017-01-25 ENCOUNTER — Ambulatory Visit (INDEPENDENT_AMBULATORY_CARE_PROVIDER_SITE_OTHER): Payer: Medicare Other | Admitting: Vascular Surgery

## 2017-01-25 ENCOUNTER — Encounter (INDEPENDENT_AMBULATORY_CARE_PROVIDER_SITE_OTHER): Payer: Self-pay | Admitting: Vascular Surgery

## 2017-01-25 VITALS — BP 154/74 | HR 90 | Resp 16 | Ht 66.0 in | Wt 283.0 lb

## 2017-01-25 DIAGNOSIS — N186 End stage renal disease: Secondary | ICD-10-CM | POA: Diagnosis not present

## 2017-01-25 DIAGNOSIS — Z992 Dependence on renal dialysis: Secondary | ICD-10-CM | POA: Diagnosis not present

## 2017-01-25 DIAGNOSIS — I10 Essential (primary) hypertension: Secondary | ICD-10-CM

## 2017-01-25 DIAGNOSIS — E1122 Type 2 diabetes mellitus with diabetic chronic kidney disease: Secondary | ICD-10-CM | POA: Diagnosis not present

## 2017-01-25 NOTE — Assessment & Plan Note (Signed)
The patient is now experiencing worse skin breakdown and problems with her left brachiocephalic AV fistula. This has been her access for many years. We discussed at length today the options for treatment. I do not believe the current situation is stable [place]-term. I believe she should have a surgical revision of her left arm AV fistula and we have had the best success using Artegraft to perform a jump graft around the aneurysms with ligation of the aneurysmal AV fistula. This will require a PermCath to be placed preliminary to this surgery, and then be used for 2-3 weeks after surgery while it heals. I discussed the risks and benefits with the patient and her husband today. I discussed that ligation of the fistula and moving it to another site is also an option, but in a [place]-term dialysis patient, revision is generally preferred. They voiced their understanding and are agreeable to proceed.

## 2017-01-25 NOTE — Assessment & Plan Note (Signed)
blood glucose control important in reducing the progression of atherosclerotic disease. Also, involved in wound healing. On appropriate medications.  

## 2017-01-25 NOTE — Assessment & Plan Note (Signed)
blood pressure control important in reducing the progression of atherosclerotic disease. On appropriate oral medications.  

## 2017-01-25 NOTE — Progress Notes (Signed)
MRN : [id number]  [name] is a 61 y.o. ([birth year]) female who presents with chief complaint of  Chief Complaint  Patient presents with  . Re-evaluation    HDA site is scabbed/dried over  .  History of Present Illness: Patient returns today in follow up of dialysis access. She has been developing more problems in the situation seems significantly worse than it was on her last visit. She is beginning to have more skin problems overlying her access sites to her aneurysmal left brachiocephalic AV fistula. There are now developing scabs with more oozing. She does not have any fever or chills or signs systemic infection. Both her arterial and her venous access sites have thin skin with scabs present. This has progressed over the past 6-8 weeks since she was last seen. This is still her dialysis access.  Current Outpatient Prescriptions  Medication Sig Dispense Refill  . albuterol (PROVENTIL HFA;VENTOLIN HFA) 108 (90 Base) MCG/ACT inhaler Inhale 2 puffs into the lungs every 6 (six) hours as needed for wheezing or shortness of breath.    . allopurinol (ZYLOPRIM) 100 MG tablet Take 150 mg by mouth Every Tuesday,Thursday,and Saturday with dialysis.     Marland Kitchen amitriptyline (ELAVIL) 25 MG tablet Take 25 mg by mouth at bedtime.     Marland Kitchen amLODipine (NORVASC) 10 MG tablet Take 10 mg by mouth daily.    Marland Kitchen aspirin 81 MG tablet Take 81 mg by mouth. Reported on 01/23/2016    . atorvastatin (LIPITOR) 40 MG tablet Take 40 mg by mouth daily at 12 noon.    . carboxymethylcellulose (REFRESH PLUS) 0.5 % SOLN Place 1-2 drops into both eyes 3 (three) times daily as needed (for dry eyes).    . citalopram (CELEXA) 40 MG tablet Take 10 mg by mouth.     . clopidogrel (PLAVIX) 75 MG tablet Take 75 mg by mouth.    . docusate sodium (STOOL SOFTENER) 100 MG capsule Take 200 mg by mouth daily.     . fluticasone (FLONASE) 50 MCG/ACT nasal spray Place 2 sprays into both nostrils daily.     . GNP ALLERGY  25 MG tablet Take 25 mg by mouth as directed.  TAKE 1 TABLET (25 MG) BY MOUTH DAY BEFORE PROCEDURE AND 1 TABLET (25 MG) 1 HOUR PRIOR TO PROCEDURE  0  . hydroxypropyl methylcellulose / hypromellose (ISOPTO TEARS / GONIOVISC) 2.5 % ophthalmic solution Place 1-2 drops into both eyes 4 (four) times daily as needed for dry eyes.    . insulin glargine (LANTUS) 100 UNIT/ML injection Inject 18 Units into the skin daily.     Marland Kitchen lidocaine-prilocaine (EMLA) cream Apply 1 application topically as needed (prior to dialysis).    . Misc. Devices [facility] MISC Frequency:PHARMDIR   Dosage:0.0     Instructions:  Note:Dx: Deconditioning with orthostatic hypotension Dose: 1    . mupirocin ointment (BACTROBAN) 2 % Apply to wound twice a day. (Patient taking differently: Apply 1 application topically 2 (two) times daily. Apply to wound twice a day.) 30 g 2  . NEOMYCIN-POLYMYXIN-HYDROCORTISONE (CORTISPORIN) 1 % SOLN otic solution Apply 1-2 drops to toe BID after soaking (Patient taking differently: 1-2 drops 2 (two) times daily. Apply 1-2 drops to toe BID after soaking) 10 mL 1  . nystatin (MYCOSTATIN/NYSTOP) powder Apply 1 g topically daily. After bathing    . omega-3 acid ethyl esters (LOVAZA) 1 G capsule Take 1 g by mouth 2 (two) times daily.     . polyethylene glycol powder (  GLYCOLAX/MIRALAX) powder Take 17 g by mouth daily. With coffee    . predniSONE (DELTASONE) 50 MG tablet Take 50 mg by mouth as directed.  Take 1 tab 13 hours before procedure, 1 TAB 7 HOURS PRIOR, THEN 1 TAB 1 HOUR PRIOR TO PROCEDURE  0  . ranitidine (ZANTAC) 150 MG tablet Take 150 mg by mouth as directed. TAKE 1 TABLET BY MOUTH 1 HOUR PRIOR TO PROCEDURE  0  . sevelamer (RENAGEL) 800 MG tablet Take 800 mg by mouth 3 (three) times daily.     Marland Kitchen VICTOZA 18 MG/3ML SOPN Inject 18 mg into the skin daily.    . VOLTAREN 1 % GEL Apply 1 application topically 2 (two) times daily.     No current facility-administered medications for  this visit.         Past Medical History:  Diagnosis Date  . Chronic kidney disease   . Diabetes mellitus without complication ([place])   . Dialysis patient ([place])   . Gout   . Hypertension     Past Surgical History:  Procedure Laterality Date  . KIDNEY SURGERY    . PERIPHERAL VASCULAR CATHETERIZATION N/A 01/23/2016   Procedure: Dialysis/Perma Catheter Insertion;  Surgeon: Algernon Huxley, MD;  Location: [place] CV LAB;  Service: Cardiovascular;  Laterality: N/A;  . PERIPHERAL VASCULAR CATHETERIZATION Left 10/01/2016   Procedure: A/V Shuntogram/Fistulagram;  Surgeon: Algernon Huxley, MD;  Location: [place] CV LAB;  Service: Cardiovascular;  Laterality: Left;    Social History     Social History  Substance Use Topics  . Smoking status: Never Smoker  . Smokeless tobacco: Never Used  . Alcohol use No  married, husband accompanies today  Family History No bleeding or clotting disorders. No aneurysms. No autoimmune diseases       Allergies  Allergen Reactions  . Enalapril Other (See Comments)    Unknown reaction  . Iodinated Diagnostic Agents Other (See Comments)    Unknown reaction      REVIEW OF SYSTEMS (Negative unless checked)  Constitutional: [] Weight loss  [] Fever  [] Chills Cardiac: [] Chest pain   [] Chest pressure   [] Palpitations   [] Shortness of breath when laying flat   [] Shortness of breath at rest   [x] Shortness of breath with exertion. Vascular:  [] Pain in legs with walking   [] Pain in legs at rest   [] Pain in legs when laying flat   [] Claudication   [] Pain in feet when walking  [] Pain in feet at rest  [] Pain in feet when laying flat   [] History of DVT   [] Phlebitis   [x] Swelling in legs   [] Varicose veins   [] Non-healing ulcers Pulmonary:   [] Uses home oxygen   [] Productive cough   [] Hemoptysis   [] Wheeze  [] COPD   [] Asthma Neurologic:  [] Dizziness  [] Blackouts   [] Seizures   [] History of stroke   [] History of TIA  [] Aphasia    [] Temporary blindness   [] Dysphagia   [] Weakness or numbness in arms   [] Weakness or numbness in legs Musculoskeletal:  [] Arthritis   [] Joint swelling   [] Joint pain   [] Low back pain Hematologic:  [] Easy bruising  [] Easy bleeding   [] Hypercoagulable state   [] Anemic   Gastrointestinal:  [] Blood in stool   [] Vomiting blood  [] Gastroesophageal reflux/heartburn   [] Abdominal pain Genitourinary:  [x] Chronic kidney disease   [] Difficult urination  [] Frequent urination  [] Burning with urination   [] Hematuria Skin:  [] Rashes   [] Ulcers   [] Wounds Psychological:  [] History of anxiety   []   History of major depression.  Physical Examination  BP 132/65   Pulse 91   Resp 17   Wt 285 lb (129.3 kg)   BMI 46.00 kg/m  Gen:  WD/WN, NAD. Morbidly obese. Head: [place]/AT, No temporalis wasting. Ear/Nose/Throat: Hearing grossly intact, nares w/o erythema or drainage, trachea midline Eyes: Conjunctiva clear. Sclera non-icteric Neck: Supple.  No JVD.  Pulmonary:  Good air movement, no use of accessory muscles.  Cardiac: RRR, normal S1, S2 Vascular: Good thrill and bruit are present in left brachiocephalic AV fistula. Skin overlying the aneurysmal segments is thin and beginning to breakdown. No signs of infection. Several scabs are present. Vessel Right Left  Radial Palpable Palpable                                   Gastrointestinal: soft, non-tender/non-distended. No guarding/reflex.  Musculoskeletal: M/S 5/5 throughout.  No deformity or atrophy.  Neurologic: Sensation grossly intact in extremities.  Symmetrical.  Speech is fluent.  Psychiatric: Judgment intact, Mood & affect appropriate for pt's clinical situation. Dermatologic: No rashes or ulcers noted.  No cellulitis or open wounds. Lymph : No Cervical, Axillary, or Inguinal lymphadenopathy.     Labs No results found for this or any previous visit (from the past 2160 hour(s)).  Radiology No results  found.    Assessment/Plan  BP (high blood pressure) blood pressure control important in reducing the progression of atherosclerotic disease. On appropriate oral medications.   Diabetes blood glucose control important in reducing the progression of atherosclerotic disease. Also, involved in wound healing. On appropriate medications.   End stage kidney disease The patient is now experiencing worse skin breakdown and problems with her left brachiocephalic AV fistula. This has been her access for many years. We discussed at length today the options for treatment. I do not believe the current situation is stable [place]-term. I believe she should have a surgical revision of her left arm AV fistula and we have had the best success using Artegraft to perform a jump graft around the aneurysms with ligation of the aneurysmal AV fistula. This will require a PermCath to be placed preliminary to this surgery, and then be used for 2-3 weeks after surgery while it heals. I discussed the risks and benefits with the patient and her husband today. I discussed that ligation of the fistula and moving it to another site is also an option, but in a [place]-term dialysis patient, revision is generally preferred. They voiced their understanding and are agreeable to proceed.    Leotis Pain, MD  01/25/2017 1:52 PM    This note was created with Dragon medical transcription system.  Any errors from dictation are purely unintentional

## 2017-01-31 ENCOUNTER — Ambulatory Visit
Admission: RE | Admit: 2017-01-31 | Discharge: 2017-01-31 | Disposition: A | Discharge status: Home / Self Care | Payer: Medicare Other | Source: Ambulatory Visit | Attending: Vascular Surgery | Admitting: Vascular Surgery

## 2017-01-31 ENCOUNTER — Encounter
Admission: RE | Disposition: A | Discharge status: Home / Self Care | Payer: Self-pay | Source: Ambulatory Visit | Attending: Vascular Surgery

## 2017-01-31 ENCOUNTER — Ambulatory Visit: Admit: 2017-01-31 | Payer: PRIVATE HEALTH INSURANCE | Admitting: Vascular Surgery

## 2017-01-31 DIAGNOSIS — M109 Gout, unspecified: Secondary | ICD-10-CM | POA: Diagnosis not present

## 2017-01-31 DIAGNOSIS — Z794 Long term (current) use of insulin: Secondary | ICD-10-CM | POA: Diagnosis not present

## 2017-01-31 DIAGNOSIS — N186 End stage renal disease: Secondary | ICD-10-CM | POA: Insufficient documentation

## 2017-01-31 DIAGNOSIS — Z992 Dependence on renal dialysis: Secondary | ICD-10-CM | POA: Insufficient documentation

## 2017-01-31 DIAGNOSIS — Y832 Surgical operation with anastomosis, bypass or graft as the cause of abnormal reaction of the patient, or of later complication, without mention of misadventure at the time of the procedure: Secondary | ICD-10-CM | POA: Insufficient documentation

## 2017-01-31 DIAGNOSIS — Z888 Allergy status to other drugs, medicaments and biological substances status: Secondary | ICD-10-CM | POA: Insufficient documentation

## 2017-01-31 DIAGNOSIS — I120 Hypertensive chronic kidney disease with stage 5 chronic kidney disease or end stage renal disease: Secondary | ICD-10-CM | POA: Diagnosis not present

## 2017-01-31 DIAGNOSIS — T82898A Other specified complication of vascular prosthetic devices, implants and grafts, initial encounter: Secondary | ICD-10-CM | POA: Diagnosis not present

## 2017-01-31 DIAGNOSIS — Z7982 Long term (current) use of aspirin: Secondary | ICD-10-CM | POA: Diagnosis not present

## 2017-01-31 DIAGNOSIS — E1122 Type 2 diabetes mellitus with diabetic chronic kidney disease: Secondary | ICD-10-CM | POA: Diagnosis not present

## 2017-01-31 DIAGNOSIS — Z7902 Long term (current) use of antithrombotics/antiplatelets: Secondary | ICD-10-CM | POA: Diagnosis not present

## 2017-01-31 HISTORY — PX: DIALYSIS/PERMA CATHETER INSERTION: CATH118288

## 2017-01-31 LAB — POTASSIUM (ARMC VASCULAR LAB ONLY): Potassium (ARMC vascular lab): 4.2 (ref 3.5–5.1)

## 2017-01-31 SURGERY — DIALYSIS/PERMA CATHETER INSERTION
Anesthesia: Moderate Sedation

## 2017-01-31 MED ORDER — HEPARIN SODIUM (PORCINE) 1000 UNIT/ML IJ SOLN
INTRAMUSCULAR | Status: AC
  Filled 2017-01-31: qty 1

## 2017-01-31 MED ORDER — SODIUM CHLORIDE 0.9 % IV SOLN
INTRAVENOUS | Status: DC
  Administered 2017-01-31: 10:00:00 via INTRAVENOUS

## 2017-01-31 MED ORDER — HEPARIN SODIUM (PORCINE) 10000 UNIT/ML IJ SOLN
INTRAMUSCULAR | Status: AC
  Filled 2017-01-31: qty 1

## 2017-01-31 MED ORDER — METHYLPREDNISOLONE SODIUM SUCC 125 MG IJ SOLR
INTRAMUSCULAR | Status: AC
  Filled 2017-01-31: qty 2

## 2017-01-31 MED ORDER — FAMOTIDINE 20 MG PO TABS
ORAL_TABLET | ORAL | Status: AC
  Filled 2017-01-31: qty 1

## 2017-01-31 MED ORDER — LIDOCAINE-EPINEPHRINE (PF) 2 %-1:200000 IJ SOLN
INTRAMUSCULAR | Status: AC
  Filled 2017-01-31: qty 40

## 2017-01-31 MED ORDER — FAMOTIDINE 20 MG PO TABS
20.0000 mg | ORAL_TABLET | Freq: Once | ORAL | Status: AC
  Administered 2017-01-31: 20 mg via ORAL

## 2017-01-31 MED ORDER — MIDAZOLAM HCL 5 MG/5ML IJ SOLN
INTRAMUSCULAR | Status: AC
  Filled 2017-01-31: qty 5

## 2017-01-31 MED ORDER — MIDAZOLAM HCL 2 MG/2ML IJ SOLN
INTRAMUSCULAR | Status: DC | PRN
  Administered 2017-01-31: 2 mg via INTRAVENOUS

## 2017-01-31 MED ORDER — FENTANYL CITRATE (PF) 100 MCG/2ML IJ SOLN
INTRAMUSCULAR | Status: DC | PRN
  Administered 2017-01-31: 50 ug via INTRAVENOUS

## 2017-01-31 MED ORDER — CEFAZOLIN IN D5W 1 GM/50ML IV SOLN
1.0000 g | Freq: Once | INTRAVENOUS | Status: AC
  Administered 2017-01-31: 1 g via INTRAVENOUS

## 2017-01-31 MED ORDER — FENTANYL CITRATE (PF) 100 MCG/2ML IJ SOLN
INTRAMUSCULAR | Status: AC
  Filled 2017-01-31: qty 4

## 2017-01-31 MED ORDER — IOPAMIDOL (ISOVUE-300) INJECTION 61%
INTRAVENOUS | Status: DC | PRN
  Administered 2017-01-31: 10 mL via INTRA_ARTERIAL

## 2017-01-31 MED ORDER — METHYLPREDNISOLONE SODIUM SUCC 125 MG IJ SOLR
125.0000 mg | Freq: Once | INTRAMUSCULAR | Status: AC
  Administered 2017-01-31: 125 mg via INTRAVENOUS

## 2017-01-31 MED ORDER — DEXTROSE 5 % IV SOLN: 1.5000 g | INTRAVENOUS | Status: DC

## 2017-01-31 SURGICAL SUPPLY — 5 items
CATH PALINDROME RT-P 15FX19CM (CATHETERS) IMPLANT
CATH PALINDROME-P 44CM KIT (CATHETERS) ×1
GLIDEWIRE STIFF .35X180X3 HYDR (WIRE) ×2 IMPLANT
KIT CATH 64X44X15FR RVRS (CATHETERS) ×1 IMPLANT
PACK ANGIOGRAPHY (CUSTOM PROCEDURE TRAY) ×2 IMPLANT

## 2017-01-31 NOTE — Op Note (Signed)
OPERATIVE NOTE    PRE-OPERATIVE DIAGNOSIS: 1. ESRD 2. Need for surgical revision of AVF  POST-OPERATIVE DIAGNOSIS: same as above with right jugular occlusion  PROCEDURE: 1. Ultrasound guidance for vascular access to the right jugular vein and the right femoral vein 2. Right jugular needle venogram 3. Fluoroscopic guidance for placement of catheter 4. Placement of a 44 cm tip to cuff tunneled hemodialysis catheter via the right femoral vein  SURGEON: Leotis Pain, MD  ANESTHESIA:  Local with moderate conscious sedation for approximately 30 minutes using 2 mg of Versed and 50 mcg of Fentanyl  ESTIMATED BLOOD LOSS: 20 cc  FINDING(S): 1.  Patent right femoral vein  2.  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   The patient needs Cardoza term dialysis access for their ESRD, and a Permcath is necessary.  Risks and benefits are discussed and informed consent is obtained.    DESCRIPTION: After obtaining full informed written consent, the patient was brought back to the vascular suited. Moderate conscious sedation was administered throughout the procedure with a face-to-face encounter with my presence for the entire procedure and with my supervision of the RN monitoring the patient's vital signs, pulse oximetry, telemetry, and mental status throughout the procedure. I started by prepping and draping the patient's right neck. In the upper neck, the jugular vein was patent and was accessed under direct ultrasound guidance without difficulty with a Seldinger needle and a permanent image was recorded. The wire would only pass about 10-12 cm however. After multiple attempts with a J-wire and a Glidewire, I elected to perform a needle venogram. A right jugular venogram demonstrated occlusion of the jugular vein just above the clavicle with  multiple collaterals. I was unable to cross the occlusion successfully with a glide wire, so I quickly abandoned this approach. The left jugular vein did not appear to be patent on ultrasound so attempts at access on the left side of the neck were not performed.  The patient's right groin was sterilely prepped and draped and a sterile surgical field was created.  The right femoral vein was visualized with ultrasound and found to be patent. It was then accessed under direct ultrasound guidance and a permanent image was recorded. A wire was placed. After skin nick and dilatation, the peel-away sheath was placed over the wire. I then turned my attention to an area about 5 cm inferior and lateral to the access incision and a small counterincision was created.  I tunneled from the counter  incision to the access site. Using fluoroscopic guidance, a 98 centimeterer tip to cuff tunneled hemodialysis catheter was selected, and tunneled from the counter  incision to the access site. It was then placed through the peel-away sheath and the peel-away sheath was removed. Using fluoroscopic guidance the catheter tips were parked in the retrohepatic vena cava just below the heart. The appropriate distal connectors were placed. It withdrew blood well and flushed easily with heparinized saline and a concentrated heparin solution was then placed. It was secured to the leg  with 2 Prolene sutures. The access incision was closed single 4-0 Monocryl. A 4-0 Monocryl pursestring suture was placed around the exit site. Sterile dressings were placed. The patient tolerated the procedure well and was taken to the recovery room in stable condition.  COMPLICATIONS: None  CONDITION: Stable  Leotis Pain 01/31/2017 11:53 AM   This note was created with Dragon Medical transcription system. Any errors in dictation are purely unintentional.

## 2017-01-31 NOTE — H&P (Signed)
[facility] History & Physical Update  The patient was interviewed and re-examined.  The patient's previous History and Physical has been reviewed and is unchanged.  There is no change in the plan of care. We plan to proceed with the scheduled procedure.  Leotis Pain, MD  01/31/2017, 9:52 AM

## 2017-01-31 NOTE — Discharge Instructions (Signed)
Tunneled Catheter Insertion, Care After  Refer to this sheet in the next few weeks. These instructions provide you with information about caring for yourself after your procedure. Your health care provider may also give you more specific instructions. Your treatment has been planned according to current medical practices, but problems sometimes occur. Call your health care provider if you have any problems or questions after your procedure.  What can I expect after the procedure?  After the procedure, it is common to have:  · Some mild redness, swelling, and pain around your catheter site.  · A small amount of blood or clear fluid coming from your incisions.    Follow these instructions at home:  Incision care   · Check your incision areas every day for signs of infection. Check for:  ? More redness, swelling, or pain.  ? More fluid or blood.  ? Warmth.  ? Pus or a bad smell.  · Follow instructions from your health care provider about how to take care of your incisions. Make sure you:  ? Wash your hands with soap and water before you change your bandages (dressings). If soap and water are not available, use hand sanitizer.  ? Change your dressings as told by your health care provider. Wash the area around your incisions with a germ-killing (antiseptic) solution when you change your dressing, as told by your health care provider.  ? Leave stitches (sutures), skin glue, or adhesive strips in place. These skin closures may need to stay in place for 2 weeks or longer. If adhesive strip edges start to loosen and curl up, you may trim the loose edges. Do not remove adhesive strips completely unless your health care provider tells you to do that.  Catheter Care     · Wash your hands with soap and water before and after caring for your catheter. If soap and water are not available, use hand sanitizer.  · Keep your catheter site and your dressings clean and dry.  · Apply an antibiotic ointment to your catheter site as told  by your health care provider.  · Flush your catheter as told by your health care provider. This helps prevent it from becoming clogged.  · Do not open the caps on the ends of the catheter.  · Do not pull on your catheter.  · If your catheter is in your arm:  ? Avoid wearing tight clothes or tight jewelry on your arm that has the catheter.  ? Do not sleep with your head on the arm that has the catheter.  ? Do not allow your blood pressure to be taken on the arm that has the catheter.  ? Do not allow your blood to be drawn from the arm that has the catheter, except through the catheter itself.  Medicines   · Take over-the-counter and prescription medicines only as told by your health care provider.  · If you were prescribed an antibiotic medicine, take it as told by your health care provider. Do not stop taking the antibiotic even if you start to feel better.  Activity   · Return to your normal activities as told by your health care provider. Ask your health care provider what activities are safe for you.  · Do not lift anything that is heavier than 10 lb (4.5 kg) for 3 weeks or as [place] as told by your health care provider.  Driving   · Do not drive until your health care provider approves.  ·   Do not drive or operate heavy machinery while taking prescription pain medicine.  General instructions   · Follow your health care provider's specific instructions for the type of catheter that you have.  · Do not take baths, swim, or use a hot tub until your health care provider approves.  · Follow instructions from your health care provider about eating or drinking restrictions.  · Wear compression stockings as told by your health care provider. These stockings help to prevent blood clots and reduce swelling in your legs.  · Keep all follow-up visits as told by your health care provider. This is important.  Contact a health care provider if:  · You have more fluid or blood coming from your incisions.  · You have more redness,  swelling, or pain at your incisions or around the area where your catheter is inserted.  · Your incisions feel warm to the touch.  · You feel unusually weak.  · You feel nauseous.  · Your catheter is not working properly.  · You have blood or fluid draining from your catheter.  · You are unable to flush your catheter.  Get help right away if:  · Your catheter breaks.  · A hole develops in your catheter.  · Your catheter comes loose or gets pulled completely out. If this happens, press on your catheter site firmly with your hand or a clean cloth until you get medical help.  · Your catheter becomes blocked.  · You have swelling in your arm, shoulder, neck, or face.  · You develop chest pain.  · You have difficulty breathing.  · You feel dizzy or light-headed.  · You have pus or a bad smell coming from your incisions.  · You have a fever.  · You develop bleeding from your catheter or your insertion site, and your bleeding does not stop.  This information is not intended to replace advice given to you by your health care provider. Make sure you discuss any questions you have with your health care provider.  Document Released: 11/05/2012 Document Revised: 07/22/2016 Document Reviewed: 08/15/2015  Elsevier Interactive Patient Education © 2017 [employer]

## 2017-02-01 ENCOUNTER — Encounter (INDEPENDENT_AMBULATORY_CARE_PROVIDER_SITE_OTHER): Payer: Medicare Other

## 2017-02-01 ENCOUNTER — Encounter: Payer: Self-pay | Admitting: Vascular Surgery

## 2017-02-01 ENCOUNTER — Ambulatory Visit (INDEPENDENT_AMBULATORY_CARE_PROVIDER_SITE_OTHER): Payer: Medicare Other | Admitting: Vascular Surgery

## 2017-02-05 ENCOUNTER — Encounter (INDEPENDENT_AMBULATORY_CARE_PROVIDER_SITE_OTHER): Payer: Self-pay

## 2017-02-07 ENCOUNTER — Other Ambulatory Visit (INDEPENDENT_AMBULATORY_CARE_PROVIDER_SITE_OTHER): Payer: Self-pay | Admitting: Vascular Surgery

## 2017-02-08 ENCOUNTER — Telehealth (INDEPENDENT_AMBULATORY_CARE_PROVIDER_SITE_OTHER): Payer: Self-pay

## 2017-02-08 NOTE — Telephone Encounter (Signed)
Replace gauze as needed.  Keep sitting up as much as possible to lower venous pressure and that will decrease drainage.  If it keeps draining, come in and see Maudie Mercury on Monday to recheck.

## 2017-02-08 NOTE — Telephone Encounter (Signed)
Spoke with the patient and let her know what Dr. Lucky Cowboy said and she understood. See note below.

## 2017-02-08 NOTE — Telephone Encounter (Signed)
Patient's husband stated that there is drainage around her catheter.

## 2017-02-19 ENCOUNTER — Other Ambulatory Visit (INDEPENDENT_AMBULATORY_CARE_PROVIDER_SITE_OTHER): Payer: Self-pay | Admitting: Vascular Surgery

## 2017-02-21 ENCOUNTER — Encounter
Admission: RE | Disposition: A | Discharge status: Home / Self Care | Payer: Self-pay | Source: Ambulatory Visit | Attending: Vascular Surgery

## 2017-02-21 ENCOUNTER — Ambulatory Visit
Admission: RE | Admit: 2017-02-21 | Discharge: 2017-02-21 | Disposition: A | Discharge status: Home / Self Care | Payer: Medicare Other | Source: Ambulatory Visit | Attending: Vascular Surgery | Admitting: Vascular Surgery

## 2017-02-21 DIAGNOSIS — E1122 Type 2 diabetes mellitus with diabetic chronic kidney disease: Secondary | ICD-10-CM | POA: Diagnosis not present

## 2017-02-21 DIAGNOSIS — Z993 Dependence on wheelchair: Secondary | ICD-10-CM | POA: Diagnosis not present

## 2017-02-21 DIAGNOSIS — Z452 Encounter for adjustment and management of vascular access device: Secondary | ICD-10-CM | POA: Diagnosis not present

## 2017-02-21 DIAGNOSIS — Z888 Allergy status to other drugs, medicaments and biological substances status: Secondary | ICD-10-CM | POA: Insufficient documentation

## 2017-02-21 DIAGNOSIS — Z7902 Long term (current) use of antithrombotics/antiplatelets: Secondary | ICD-10-CM | POA: Diagnosis not present

## 2017-02-21 DIAGNOSIS — M109 Gout, unspecified: Secondary | ICD-10-CM | POA: Insufficient documentation

## 2017-02-21 DIAGNOSIS — N186 End stage renal disease: Secondary | ICD-10-CM | POA: Diagnosis not present

## 2017-02-21 DIAGNOSIS — Z992 Dependence on renal dialysis: Secondary | ICD-10-CM | POA: Insufficient documentation

## 2017-02-21 DIAGNOSIS — I120 Hypertensive chronic kidney disease with stage 5 chronic kidney disease or end stage renal disease: Secondary | ICD-10-CM | POA: Diagnosis not present

## 2017-02-21 DIAGNOSIS — Z794 Long term (current) use of insulin: Secondary | ICD-10-CM | POA: Insufficient documentation

## 2017-02-21 HISTORY — PX: DIALYSIS/PERMA CATHETER INSERTION: CATH118288

## 2017-02-21 LAB — GLUCOSE, CAPILLARY: Glucose-Capillary: 114 mg/dL — ABNORMAL HIGH (ref 65–99)

## 2017-02-21 SURGERY — DIALYSIS/PERMA CATHETER INSERTION
Anesthesia: Moderate Sedation

## 2017-02-21 MED ORDER — SODIUM CHLORIDE 0.9 % IV SOLN
INTRAVENOUS | Status: DC
  Administered 2017-02-21: 13:00:00 via INTRAVENOUS

## 2017-02-21 MED ORDER — MIDAZOLAM HCL 5 MG/5ML IJ SOLN
INTRAMUSCULAR | Status: AC
  Filled 2017-02-21: qty 5

## 2017-02-21 MED ORDER — FENTANYL CITRATE (PF) 100 MCG/2ML IJ SOLN
INTRAMUSCULAR | Status: AC
  Filled 2017-02-21: qty 2

## 2017-02-21 MED ORDER — FAMOTIDINE 20 MG PO TABS
ORAL_TABLET | ORAL | Status: AC
  Filled 2017-02-21: qty 2

## 2017-02-21 MED ORDER — FENTANYL CITRATE (PF) 100 MCG/2ML IJ SOLN
INTRAMUSCULAR | Status: DC | PRN
  Administered 2017-02-21: 50 ug via INTRAVENOUS

## 2017-02-21 MED ORDER — METHYLPREDNISOLONE SODIUM SUCC 125 MG IJ SOLR
INTRAMUSCULAR | Status: AC
  Filled 2017-02-21: qty 2

## 2017-02-21 MED ORDER — HYDROMORPHONE HCL 1 MG/ML IJ SOLN: 1.0000 mg | Freq: Once | INTRAMUSCULAR | Status: DC

## 2017-02-21 MED ORDER — MIDAZOLAM HCL 2 MG/2ML IJ SOLN
INTRAMUSCULAR | Status: DC | PRN
  Administered 2017-02-21: 1 mg via INTRAVENOUS

## 2017-02-21 MED ORDER — METHYLPREDNISOLONE SODIUM SUCC 125 MG IJ SOLR
125.0000 mg | INTRAMUSCULAR | Status: DC | PRN
Start: 2017-02-21 — End: 2017-02-21

## 2017-02-21 MED ORDER — HEPARIN SODIUM (PORCINE) 10000 UNIT/ML IJ SOLN
INTRAMUSCULAR | Status: AC
  Filled 2017-02-21: qty 1

## 2017-02-21 MED ORDER — LIDOCAINE-EPINEPHRINE (PF) 2 %-1:200000 IJ SOLN
INTRAMUSCULAR | Status: AC
  Filled 2017-02-21: qty 20

## 2017-02-21 MED ORDER — ONDANSETRON HCL 4 MG/2ML IJ SOLN: 4.0000 mg | Freq: Four times a day (QID) | INTRAMUSCULAR | Status: DC | PRN

## 2017-02-21 MED ORDER — CEFUROXIME SODIUM 1.5 G IJ SOLR
1.5000 g | Freq: Once | INTRAMUSCULAR | Status: AC
  Administered 2017-02-21: 1.5 g via INTRAVENOUS

## 2017-02-21 MED ORDER — CEFAZOLIN IN D5W 1 GM/50ML IV SOLN: 1.0000 g | Freq: Once | INTRAVENOUS | Status: DC

## 2017-02-21 MED ORDER — FAMOTIDINE 20 MG PO TABS
40.0000 mg | ORAL_TABLET | ORAL | Status: DC | PRN
Start: 2017-02-21 — End: 2017-02-21
  Administered 2017-02-21: 40 mg via ORAL

## 2017-02-21 SURGICAL SUPPLY — 4 items
CATH PALINDROME-P 44CM KIT (CATHETERS) ×1
GUIDEWIRE SUPER STIFF .035X180 (WIRE) ×2 IMPLANT
KIT CATH 64X44X15FR RVRS (CATHETERS) ×1 IMPLANT
PACK ANGIOGRAPHY (CUSTOM PROCEDURE TRAY) ×2 IMPLANT

## 2017-02-21 NOTE — H&P (Signed)
[name] History & Physical Update  The patient was interviewed and re-examined.  The patient's previous History and Physical has been reviewed and her permcath is working poorly.  The dialysis center is requesting it be exchanged.  There is no change in the plan of care. We plan to proceed with the scheduled procedure.  Leotis Pain, MD  02/21/2017, 12:55 PM

## 2017-02-21 NOTE — Op Note (Signed)
OPERATIVE NOTE    PRE-OPERATIVE DIAGNOSIS: 1. ESRD 2. Non-functional permcath  POST-OPERATIVE DIAGNOSIS: same as above  PROCEDURE: 1. Fluoroscopic guidance for placement of catheter 2. Placement of a 44 cm tip to cuff tunneled hemodialysis catheter via the right femoral vein and removal of previous catheter  SURGEON: Leotis Pain, MD  ANESTHESIA:  Local with moderate conscious sedation for 15 minutes using 1 mg of Versed and 50 mcg of Fentanyl  ESTIMATED BLOOD LOSS: 10 cc  FINDING(S): none  SPECIMEN(S):  None  INDICATIONS:   Patient is a 61 y.o.female who presents with non-functional dialysis catheter and ESRD.  The patient needs Probus term dialysis access for their ESRD, and a Permcath is necessary.  Risks and benefits are discussed and informed consent is obtained.    DESCRIPTION: After obtaining full informed written consent, the patient was brought back to the vascular suite. The patient received moderate conscious sedation during a face-to-face encounter with me present throughout the entire procedure and supervising the RN monitoring the vital signs, pulse oximetry, telemetry, and mental status throughout the entire procedure. The patient's existing catheter, right groin and thigh were sterilely prepped and draped in a sterile surgical field was created.  The existing catheter was dissected free from the fibrous sheath securing the cuff with hemostats and blunt dissection.  A wire was placed. The existing catheter was then removed and the wire used to keep venous access. I selected a 44 cm tip to cuff tunneled dialysis catheter.  Using fluoroscopic guidance the catheter tips were parked in the retrohepatic vena cava just below the atrium. The appropriate distal connectors were placed. It withdrew blood well and flushed easily with heparinized saline and a concentrated heparin solution was then placed. It was secured to the thigh with 2 Prolene sutures. A 4-0 Monocryl pursestring suture  was placed around the exit site. Sterile dressings were placed. The patient tolerated the procedure well and was taken to the recovery room in stable condition.  COMPLICATIONS: None  CONDITION: Stable  Leotis Pain 02/21/2017 2:37 PM   This note was created with Dragon Medical transcription system. Any errors in dictation are purely unintentional.

## 2017-02-22 ENCOUNTER — Encounter: Payer: Self-pay | Admitting: Vascular Surgery

## 2017-02-25 ENCOUNTER — Encounter
Admission: RE | Admit: 2017-02-25 | Discharge: 2017-02-25 | Disposition: A | Discharge status: Home / Self Care | Payer: Medicare Other | Source: Ambulatory Visit | Attending: Vascular Surgery | Admitting: Vascular Surgery

## 2017-02-25 DIAGNOSIS — Z01812 Encounter for preprocedural laboratory examination: Secondary | ICD-10-CM | POA: Insufficient documentation

## 2017-02-25 DIAGNOSIS — Z01810 Encounter for preprocedural cardiovascular examination: Secondary | ICD-10-CM | POA: Diagnosis present

## 2017-02-25 DIAGNOSIS — I10 Essential (primary) hypertension: Secondary | ICD-10-CM | POA: Diagnosis present

## 2017-02-25 HISTORY — DX: Dyspnea, unspecified: R06.00

## 2017-02-25 HISTORY — DX: Anxiety disorder, unspecified: F41.9

## 2017-02-25 HISTORY — DX: Gastro-esophageal reflux disease without esophagitis: K21.9

## 2017-02-25 HISTORY — DX: Chronic obstructive pulmonary disease, unspecified: J44.9

## 2017-02-25 HISTORY — DX: Sleep apnea, unspecified: G47.30

## 2017-02-25 HISTORY — DX: Polyneuropathy, unspecified: G62.9

## 2017-02-25 HISTORY — DX: Malignant (primary) neoplasm, unspecified: C80.1

## 2017-02-25 HISTORY — DX: Heart failure, unspecified: I50.9

## 2017-02-25 HISTORY — DX: Peripheral vascular disease, unspecified: I73.9

## 2017-02-25 HISTORY — DX: Unspecified osteoarthritis, unspecified site: M19.90

## 2017-02-25 HISTORY — DX: Cerebral infarction, unspecified: I63.9

## 2017-02-25 HISTORY — DX: Personal history of urinary calculi: Z87.442

## 2017-02-25 HISTORY — DX: Anemia, unspecified: D64.9

## 2017-02-25 LAB — CBC WITH DIFFERENTIAL/PLATELET
Basophils Absolute: 0 10*3/uL (ref 0–0.1)
Basophils Relative: 1 %
Eosinophils Absolute: 0.1 10*3/uL (ref 0–0.7)
Eosinophils Relative: 2 %
HCT: 35.9 % (ref 35.0–47.0)
Hemoglobin: 11.9 g/dL — ABNORMAL LOW (ref 12.0–16.0)
Lymphocytes Relative: 34 %
Lymphs Abs: 1.8 10*3/uL (ref 1.0–3.6)
MCH: 29.7 pg (ref 26.0–34.0)
MCHC: 33.1 g/dL (ref 32.0–36.0)
MCV: 89.9 fL (ref 80.0–100.0)
Monocytes Absolute: 0.5 10*3/uL (ref 0.2–0.9)
Monocytes Relative: 9 %
Neutro Abs: 3 10*3/uL (ref 1.4–6.5)
Neutrophils Relative %: 54 %
Platelets: 234 10*3/uL (ref 150–440)
RBC: 3.99 MIL/uL (ref 3.80–5.20)
RDW: 15.5 % — ABNORMAL HIGH (ref 11.5–14.5)
WBC: 5.4 10*3/uL (ref 3.6–11.0)

## 2017-02-25 LAB — SURGICAL PCR SCREEN
MRSA, PCR: NEGATIVE
Staphylococcus aureus: NEGATIVE

## 2017-02-25 LAB — BASIC METABOLIC PANEL
Anion gap: 10 (ref 5–15)
BUN: 53 mg/dL — ABNORMAL HIGH (ref 6–20)
CO2: 26 mmol/L (ref 22–32)
Calcium: 8.3 mg/dL — ABNORMAL LOW (ref 8.9–10.3)
Chloride: 100 mmol/L — ABNORMAL LOW (ref 101–111)
Creatinine, Ser: 9.55 mg/dL — ABNORMAL HIGH (ref 0.44–1.00)
GFR calc Af Amer: 4 mL/min — ABNORMAL LOW (ref >60)
GFR calc non Af Amer: 4 mL/min — ABNORMAL LOW (ref >60)
Glucose, Bld: 94 mg/dL (ref 65–99)
Potassium: 3.9 mmol/L (ref 3.5–5.1)
Sodium: 136 mmol/L (ref 135–145)

## 2017-02-25 LAB — PROTIME-INR
INR: 1.02
Prothrombin Time: 13.4 seconds (ref 11.4–15.2)

## 2017-02-25 LAB — TYPE AND SCREEN
ABO/RH(D): A POS
Antibody Screen: NEGATIVE

## 2017-02-25 LAB — APTT: aPTT: 31 seconds (ref 24–36)

## 2017-02-25 NOTE — Patient Instructions (Signed)
Your procedure is scheduled on: March 06, 2017 (Wednesday)  Report to Same Day Surgery 2nd floor medical mall [facility] Entrance-take elevator on left to 2nd floor.  Check in with surgery information desk.) To find out your arrival time please call [phone] between 1PM - 3PM on March 05, 2017 (Tuesday)  Remember: Instructions that are not followed completely may result in serious medical risk, up to and including death, or upon the discretion of your surgeon and anesthesiologist your surgery may need to be rescheduled.    _x___ 1. Do not eat food or drink liquids after midnight. No gum chewing or                              hard candies.     __x__ 2. No Alcohol for 24 hours before or after surgery.   __x__3. No Smoking for 24 prior to surgery.   ____  4. Bring all medications with you on the day of surgery if instructed.    __x__ 5. Notify your doctor if there is any change in your medical condition     (cold, fever, infections).     Do not wear jewelry, make-up, hairpins, clips or nail polish.  Do not wear lotions, powders, or perfumes. You may wear deodorant.  Do not shave 48 hours prior to surgery. Men may shave face and neck.  Do not bring valuables to the hospital.    [facility] is not responsible for any belongings or valuables.               Contacts, dentures or bridgework may not be worn into surgery.  Leave your suitcase in the car. After surgery it may be brought to your room.  For patients admitted to the hospital, discharge time is determined by your                       treatment team.   Patients discharged the day of surgery will not be allowed to drive home.  You will need someone to drive you home and stay with you the night of your procedure.    Please read over the following fact sheets that you were given:   [facility] Preparing for Surgery and or MRSA Information   _x___ Take anti-hypertensive (unless it includes a diuretic), cardiac, seizure,  asthma,     anti-reflux and psychiatric medicines. These include:  1. AMLODIPINE  2. LIPITOR  3. CELEXA  4.  5.  6.  ____Fleets enema or Magnesium Citrate as directed.   _x___ Use CHG Soap or sage wipes as directed on instruction sheet   _x___ Use inhalers on the day of surgery and bring to hospital day of surgery (USE ALBUTEROL [place])  ____ Stop Metformin and Janumet 2 days prior to surgery.    __x__ Take 1/2 of usual insulin dose the night before surgery and none on the morning surgery (NO INSULIN THE MORNING OF SURGERY)   _x___ Follow recommendations from Cardiologist, Pulmonologist or PCP regarding          stopping Aspirin, Coumadin, Pllavix ,Eliquis, Effient, or Pradaxa, and Pletal. (STOP PLAVIX ON MARCH 30)  X____Stop Anti-inflammatories such as Advil, Aleve, Ibuprofen, Motrin, Naproxen, Naprosyn, Goodies powders or aspirin products. (STOP VOLTAREN GEL NOW)       OK to take Tylenol    _x___ Stop supplements until after  surgery.  But may continue Vitamin D, Vitamin B, and multivitamin (STOP FISH OIL NOW)         _x__ Bring C-Pap to the hospital.

## 2017-02-26 NOTE — Pre-Procedure Instructions (Signed)
Faxed request for H&P to Dr. Bunnie Domino office.

## 2017-02-26 NOTE — Pre-Procedure Instructions (Signed)
Dr. Amie Critchley  notified (02/25/17) of patient medical history and EKG performed.Cardiac clearance requested. Faxed request to Dr.Dew office, and Mickel Baas stated she received clearance request today.

## 2017-03-06 ENCOUNTER — Other Ambulatory Visit (INDEPENDENT_AMBULATORY_CARE_PROVIDER_SITE_OTHER): Payer: Self-pay | Admitting: Vascular Surgery

## 2017-03-06 ENCOUNTER — Encounter: Admission: RE | Payer: Self-pay | Source: Ambulatory Visit

## 2017-03-06 ENCOUNTER — Ambulatory Visit: Admission: RE | Admit: 2017-03-06 | Payer: Medicare Other | Source: Ambulatory Visit | Admitting: Vascular Surgery

## 2017-03-06 DIAGNOSIS — Z992 Dependence on renal dialysis: Principal | ICD-10-CM

## 2017-03-06 DIAGNOSIS — N186 End stage renal disease: Secondary | ICD-10-CM

## 2017-03-06 SURGERY — REVISON OF ARTERIOVENOUS FISTULA
Anesthesia: General | Laterality: Left

## 2017-03-13 ENCOUNTER — Ambulatory Visit (INDEPENDENT_AMBULATORY_CARE_PROVIDER_SITE_OTHER): Payer: Medicare Other | Admitting: Cardiology

## 2017-03-13 ENCOUNTER — Telehealth: Payer: Self-pay | Admitting: Cardiology

## 2017-03-13 ENCOUNTER — Encounter: Payer: Self-pay | Admitting: Cardiology

## 2017-03-13 VITALS — BP 116/44 | HR 92 | Ht 66.0 in | Wt 284.5 lb

## 2017-03-13 DIAGNOSIS — E785 Hyperlipidemia, unspecified: Secondary | ICD-10-CM

## 2017-03-13 DIAGNOSIS — R0602 Shortness of breath: Secondary | ICD-10-CM

## 2017-03-13 DIAGNOSIS — R9431 Abnormal electrocardiogram [ECG] [EKG]: Secondary | ICD-10-CM

## 2017-03-13 DIAGNOSIS — I10 Essential (primary) hypertension: Secondary | ICD-10-CM

## 2017-03-13 NOTE — Progress Notes (Signed)
Cardiology Office Note   Date:  03/13/2017   ID:  MADALYNE Wang, DOB Nov 09, 1956, MRN [id number]  Referring Doctor:  Maryann Conners, MD   Cardiologist:   Wende Bushy, MD   Reason for consultation:  Chief Complaint  Patient presents with  . other    NP  per AVV for cardiac clerance. Pt c/o sob with short distances, occassional cramps in bilateral legs/feet. Reviewed meds with pt verbally.      History of Present Illness: [name] is a 61 y.o. female who presents for Preoperative evaluation prior to AV fistula placement, abnormal EKG  Patient has a [place] history of shortness of breath which she attributes to building a fluid. This easily goes away once she goes for dialysis. Does not recall any chest pains. Does not recall any history of heart attacks in the past.  Patient has edema, no PND or orthopnea. No chest pains. No palpitations. No syncope.   ROS:  Please see the history of present illness. Aside from mentioned under HPI, all other systems are reviewed and negative.     Past Medical History:  Diagnosis Date  . Anemia   . Anxiety   . Arthritis   . Cancer [facility])    Left Kidney Cancer  . CHF (congestive heart failure) ([place])   . Chronic kidney disease   . COPD (chronic obstructive pulmonary disease) (H[facility])    Use Oxygen at bedtime  . Diabetes mellitus without complication ([place])   . Dialysis patient ([place])    Tues, Thurs, Sat  . Dyspnea    with exertion  . GERD (gastroesophageal reflux disease)   . Gout   . History of kidney stones   . Hypertension   . Neuropathy ([place])   . Peripheral vascular disease ([place])   . Sleep apnea    OSA--USE C-PAP  . Stroke [facility]) 2004    Past Surgical History:  Procedure Laterality Date  . ABDOMINAL HYSTERECTOMY    . DIALYSIS/PERMA CATHETER INSERTION N/A 01/31/2017   Procedure: Dialysis/Perma Catheter Insertion;  Surgeon: Algernon Huxley, MD;  Location: [place] [facility];  Service: Cardiovascular;  Laterality:  N/A;  . DIALYSIS/PERMA CATHETER INSERTION N/A 02/21/2017   Procedure: Dialysis/Perma Catheter Insertion;  Surgeon: Algernon Huxley, MD;  Location: [place] [facility];  Service: Cardiovascular;  Laterality: N/A;  . EYE SURGERY Bilateral    Cataract Extraction with IOL  . KIDNEY SURGERY Left    Partial Nephrectomy  . PERIPHERAL VASCULAR CATHETERIZATION N/A 01/23/2016   Procedure: Dialysis/Perma Catheter Insertion;  Surgeon: Algernon Huxley, MD;  Location: [place] [facility];  Service: Cardiovascular;  Laterality: N/A;  . PERIPHERAL VASCULAR CATHETERIZATION Left 10/01/2016   Procedure: A/V Shuntogram/Fistulagram;  Surgeon: Algernon Huxley, MD;  Location: [place] [facility];  Service: Cardiovascular;  Laterality: Left;     reports that she has never smoked. She has never used smokeless tobacco. She reports that she does not drink alcohol or use drugs.   family history includes Cancer in her maternal aunt; Diabetes in her father; Heart attack in her father; Heart failure in her mother; Hypertension in her mother; Lung cancer in her maternal aunt.   Outpatient Medications Prior to Visit  Medication Sig Dispense Refill  . acetaminophen (TYLENOL 8 HOUR) 650 MG CR tablet Take 650 mg by mouth every 8 (eight) hours as needed for pain.    Marland Kitchen albuterol (PROVENTIL HFA;VENTOLIN HFA) 108 (90 Base) MCG/ACT inhaler Inhale 2 puffs into the  lungs every 6 (six) hours as needed for wheezing or shortness of breath.    . allopurinol (ZYLOPRIM) 100 MG tablet Take 150 mg by mouth Every Tuesday,Thursday,and Saturday with dialysis.     Marland Kitchen amitriptyline (ELAVIL) 25 MG tablet Take 25 mg by mouth at bedtime.     Marland Kitchen amLODipine (NORVASC) 10 MG tablet Take 10 mg by mouth daily.    Marland Kitchen aspirin 81 MG tablet Take 81 mg by mouth. Reported on 01/23/2016    . atorvastatin (LIPITOR) 40 MG tablet Take 40 mg by mouth daily at 12 noon.    . carboxymethylcellulose (REFRESH PLUS) 0.5 % SOLN Place 1-2 drops into both eyes 3 (three) times daily as  needed (for dry eyes).    . citalopram (CELEXA) 40 MG tablet Take 40 mg by mouth daily.     . clopidogrel (PLAVIX) 75 MG tablet Take 75 mg by mouth daily.     . diclofenac sodium (VOLTAREN) 1 % GEL Apply 1 g topically 2 (two) times daily as needed (for pain.).    Marland Kitchen docusate sodium (STOOL SOFTENER) 100 MG capsule Take 200 mg by mouth daily.     . fluticasone (FLONASE) 50 MCG/ACT nasal spray Place 2 sprays into both nostrils daily.     . GNP ALLERGY 25 MG tablet Take 25 mg by mouth at bedtime.   0  . hydroxypropyl methylcellulose / hypromellose (ISOPTO TEARS / GONIOVISC) 2.5 % ophthalmic solution Place 1-2 drops into both eyes 4 (four) times daily as needed for dry eyes.    . insulin glargine (LANTUS) 100 UNIT/ML injection Inject 15 Units into the skin daily.     Marland Kitchen lidocaine-prilocaine (EMLA) cream Apply 1 application topically as needed (prior to dialysis).    Marland Kitchen loratadine (CLARITIN) 10 MG tablet Take 10 mg by mouth at bedtime.    . Misc. Devices [place]) MISC Frequency:PHARMDIR   Dosage:0.0     Instructions:  Note:Dx: Deconditioning with orthostatic hypotension Dose: 1    . nystatin (MYCOSTATIN/NYSTOP) powder Apply 1 g topically daily as needed (for skin rashes.).     Marland Kitchen omega-3 acid ethyl esters (LOVAZA) 1 g capsule Take 1 g by mouth 2 (two) times daily. NOON & BEDTIME    . OXYGEN Inhale 2 L into the lungs at bedtime.    . polyethylene glycol powder (GLYCOLAX/MIRALAX) powder Take 17 g by mouth daily. With coffee    . predniSONE (DELTASONE) 50 MG tablet Take 50 mg by mouth as directed.  Take 1 tab 13 hours before procedure, 1 TAB 7 HOURS PRIOR, THEN 1 TAB 1 HOUR PRIOR TO PROCEDURE  0  . ranitidine (ZANTAC) 150 MG tablet Take 150 mg by mouth daily.   0  . sevelamer (RENAGEL) 800 MG tablet Take 800 mg by mouth 3 (three) times daily.     Marland Kitchen VICTOZA 18 MG/3ML SOPN Inject 12 mg into the skin daily.      No facility-administered medications prior to visit.      Allergies: Iodine; Enalapril; and  Iodinated diagnostic agents    PHYSICAL EXAM: VS:  BP (!) 116/44 (BP Location: Right Arm, Patient Position: Sitting, Cuff Size: Normal)   Pulse 92   Ht 5\' 6"  (1.676 m)   Wt 284 lb 8 oz (129 kg)   BMI 45.92 kg/m  , Body mass index is 45.92 kg/m. Wt Readings from Last 3 Encounters:  03/13/17 284 lb 8 oz (129 kg)  02/25/17 283 lb (128.4 kg)  02/21/17 285 lb (129.3 kg)  GENERAL:  well developed, well nourished, Morbidly obese, not in acute distress HEENT: normocephalic, pink conjunctivae, anicteric sclerae, no xanthelasma, normal dentition, oropharynx clear NECK:  no neck vein engorgement, JVP normal, no hepatojugular reflux, carotid upstroke brisk and symmetric, no bruit, no thyromegaly, no lymphadenopathy LUNGS:  good respiratory effort, clear to auscultation bilaterally CV:  PMI not displaced, no thrills, no lifts, S1 and S2 within normal limits, no palpable S3 or S4, no murmurs, no rubs, no gallops ABD:  Soft, nontender, nondistended, normoactive bowel sounds, no abdominal aortic bruit, no hepatomegaly, no splenomegaly MS: nontender back, no kyphosis, no scoliosis, no joint deformities EXT:  2+ DP/PT pulses, +1 edema, no varicosities, no cyanosis, no clubbing SKIN: warm, nondiaphoretic, normal turgor, no ulcers NEUROPSYCH: alert, oriented to person, place, and time, sensory/motor grossly intact, normal mood, appropriate affect  Recent Labs: 02/25/2017: BUN 53; Creatinine, Ser 9.55; Hemoglobin 11.9; Platelets 234; Potassium 3.9; Sodium 136   Lipid Panel    Component Value Date/Time   CHOL 154 03/24/2015 0531   TRIG 155 (H) 03/24/2015 0531   HDL 33 (L) 03/24/2015 0531   VLDL 31 03/24/2015 0531   LDLCALC 90 03/24/2015 0531     Other studies Reviewed:  EKG:  The ekg from 03/13/2017 was personally reviewed by me and it revealed sinus rhythm, 92 BPM, poor R-wave progression versus Q waves in V1 to V4, cannot rule out anterior infarct.  Additional studies/ records that were  reviewed personally reviewed by me today include: None available   ASSESSMENT AND PLAN:  Shortness of breath Abnormal EKG Risk factors for CAD include age, hypertension, hyperlipidemia, diabetes Recommend pharmacologic nuclear stress test as patient cannot walk on the treadmill. Recommend echocardiogram as well.  Hypertension BP is well controlled. Continue monitoring BP. Continue current medical therapy and lifestyle changes.  Hyperlipidemia PCP following labs. LDL goal is less than 70 due to diabetes. Continue statin therapy.   Current medicines are reviewed at length with the patient today.  The patient does not have concerns regarding medicines.  Labs/ tests ordered today include:  Orders Placed This Encounter  Procedures  . NM Myocar Multi W/Spect W/Wall Motion / EF  . EKG 12-Lead  . ECHOCARDIOGRAM COMPLETE    I had a lengthy and detailed discussion with the patient regarding diagnoses, prognosis, diagnostic options.  Disposition:   FU with Cardiology after tests   Thank you for this consultation. We will forwarding this consultation to referring physician.    I spent at least 45 minutes with the patient today and more than 50% of the time was spent counseling the patient and coordinating care.   Signed, Wende Bushy, MD  03/13/2017 5:50 PM    [place]  This note was generated in part with voice recognition software and I apologize for any typographical errors that were not detected and corrected.

## 2017-03-13 NOTE — Telephone Encounter (Addendum)
Cardiac clearance request received from [facility] for upcoming Revision Left UE AV Fistula w/artegraft. From to be sent to fax [phone] and questions to call [phone]. Form placed in red folder in "To Do" bin on Pamela's desk.

## 2017-03-13 NOTE — Patient Instructions (Addendum)
Testing/Procedures: Your physician has requested that you have an echocardiogram. Echocardiography is a painless test that uses sound waves to create images of your heart. It provides your doctor with information about the size and shape of your heart and how well your heart's chambers and valves are working. This procedure takes approximately one hour. There are no restrictions for this procedure.  [place]  Your caregiver has ordered a Stress Test with nuclear imaging. The purpose of this test is to evaluate the blood supply to your heart muscle. This procedure is referred to as a "Non-Invasive Stress Test." This is because other than having an IV started in your vein, nothing is inserted or "invades" your body. Cardiac stress tests are done to find areas of poor blood flow to the heart by determining the extent of coronary artery disease (CAD). Some patients exercise on a treadmill, which naturally increases the blood flow to your heart, while others who are  unable to walk on a treadmill due to physical limitations have a pharmacologic/chemical stress agent called Lexiscan . This medicine will mimic walking on a treadmill by temporarily increasing your coronary blood flow.   Please note: these test may take anywhere between 2-4 hours to complete  PLEASE REPORT TO Center AT THE FIRST DESK WILL DIRECT YOU WHERE TO GO  Date of Procedure:_____________________________________  Arrival Time for Procedure:______________________________  Instructions regarding medication:   __X__ : Hold diabetes medication morning of procedure  __X__:  The night before each test take HALF of your usual dose of insulin    PLEASE NOTIFY THE OFFICE AT LEAST 24 HOURS IN ADVANCE IF YOU ARE UNABLE TO KEEP YOUR APPOINTMENT.  [phone] AND  PLEASE NOTIFY NUCLEAR MEDICINE AT [facility] AT LEAST 24 HOURS IN ADVANCE IF YOU ARE UNABLE TO KEEP YOUR APPOINTMENT. [phone]  How to  prepare for your Myoview test:  1. Do not eat or drink after midnight 2. No caffeine for 24 hours prior to test 3. No smoking 24 hours prior to test. 4. Your medication may be taken with water.  If your doctor stopped a medication because of this test, do not take that medication. 5. Ladies, please do not wear dresses.  Skirts or pants are appropriate. Please wear a short sleeve shirt. 6. No perfume, cologne or lotion. 7. Wear comfortable walking shoes. No heels!      Follow-Up: Your physician recommends that you schedule a follow-up appointment as needed. We will call you with results and if needed schedule follow up at that time.   It was a pleasure seeing you today here in the office. Please do not hesitate to give [place] a call back if you have any further questions. [place], BSN    Echocardiogram An echocardiogram, or echocardiography, uses sound waves (ultrasound) to produce an image of your heart. The echocardiogram is simple, painless, obtained within a short period of time, and offers valuable information to your health care provider. The images from an echocardiogram can provide information such as:  Evidence of coronary artery disease (CAD).  Heart size.  Heart muscle function.  Heart valve function.  Aneurysm detection.  Evidence of a past heart attack.  Fluid buildup around the heart.  Heart muscle thickening.  Assess heart valve function. Tell a health care provider about:  Any allergies you have.  All medicines you are taking, including vitamins, herbs, eye drops, creams, and over-the-counter medicines.  Any problems you or family  members have had with anesthetic medicines.  Any blood disorders you have.  Any surgeries you have had.  Any medical conditions you have.  Whether you are pregnant or may be pregnant. What happens before the procedure? No special preparation is needed. Eat and drink normally. What happens during the  procedure?  In order to produce an image of your heart, gel will be applied to your chest and a wand-like tool (transducer) will be moved over your chest. The gel will help transmit the sound waves from the transducer. The sound waves will harmlessly bounce off your heart to allow the heart images to be captured in real-time motion. These images will then be recorded.  You may need an IV to receive a medicine that improves the quality of the pictures. What happens after the procedure? You may return to your normal schedule including diet, activities, and medicines, unless your health care provider tells you otherwise. This information is not intended to replace advice given to you by your health care provider. Make sure you discuss any questions you have with your health care provider. Document Released: 11/16/2000 Document Revised: 07/07/2016 Document Reviewed: 07/27/2013 Elsevier Interactive Patient Education  2017 [place]. Pharmacologic Stress Electrocardiogram Introduction A pharmacologic stress electrocardiogram is a heart (cardiac) test that uses nuclear imaging to evaluate the blood supply to your heart. This test may also be called a pharmacologic stress electrocardiography. Pharmacologic means that a medicine is used to increase your heart rate and blood pressure. This stress test is done to find areas of poor blood flow to the heart by determining the extent of coronary artery disease (CAD). Some people exercise on a treadmill, which naturally increases the blood flow to the heart. For those people unable to exercise on a treadmill, a medicine is used. This medicine stimulates your heart and will cause your heart to beat harder and more quickly, as if you were exercising. Pharmacologic stress tests can help determine:  The adequacy of blood flow to your heart during increased levels of activity in order to clear you for discharge home.  The extent of coronary artery blockage caused  by CAD.  Your prognosis if you have suffered a heart attack.  The effectiveness of cardiac procedures done, such as an angioplasty, which can increase the circulation in your coronary arteries.  Causes of chest pain or pressure. LET [facility] CARE PROVIDER KNOW ABOUT:  Any allergies you have.  All medicines you are taking, including vitamins, herbs, eye drops, creams, and over-the-counter medicines.  Previous problems you or members of your family have had with the use of anesthetics.  Any blood disorders you have.  Previous surgeries you have had.  Medical conditions you have.  Possibility of pregnancy, if this applies.  If you are currently breastfeeding. RISKS AND COMPLICATIONS Generally, this is a safe procedure. However, as with any procedure, complications can occur. Possible complications include:  You develop pain or pressure in the following areas:  Chest.  Jaw or neck.  Between your shoulder blades.  Radiating down your left arm.  Headache.  Dizziness or light-headedness.  Shortness of breath.  Increased or irregular heartbeat.  Low blood pressure.  Nausea or vomiting.  Flushing.  Redness going up the arm and slight pain during injection of medicine.  Heart attack (rare). BEFORE THE PROCEDURE  Avoid all forms of caffeine for 24 hours before your test or as directed by your health care provider. This includes coffee, tea (even decaffeinated tea), caffeinated sodas, chocolate,  cocoa, and certain pain medicines.  Follow your health care provider's instructions regarding eating and drinking before the test.  Take your medicines as directed at regular times with water unless instructed otherwise. Exceptions may include:  If you have diabetes, ask how you are to take your insulin or pills. It is common to adjust insulin dosing the morning of the test.  If you are taking beta-blocker medicines, it is important to talk to your health care provider  about these medicines well before the date of your test. Taking beta-blocker medicines may interfere with the test. In some cases, these medicines need to be changed or stopped 24 hours or more before the test.  If you wear a nitroglycerin patch, it may need to be removed prior to the test. Ask your health care provider if the patch should be removed before the test.  If you use an inhaler for any breathing condition, bring it with you to the test.  If you are an outpatient, bring a snack so you can eat right after the stress phase of the test.  Do not smoke for 4 hours prior to the test or as directed by your health care provider.  Do not apply lotions, powders, creams, or oils on your chest prior to the test.  Wear comfortable shoes and clothing. Let your health care provider know if you were unable to complete or follow the preparations for your test. PROCEDURE  Multiple patches (electrodes) will be put on your chest. If needed, small areas of your chest may be shaved to get better contact with the electrodes. Once the electrodes are attached to your body, multiple wires will be attached to the electrodes, and your heart rate will be monitored.  An IV access will be started. A nuclear trace (isotope) is given. The isotope may be given intravenously, or it may be swallowed. Nuclear refers to several types of radioactive isotopes, and the nuclear isotope lights up the arteries so that the nuclear images are clear. The isotope is absorbed by your body. This results in low radiation exposure.  A resting nuclear image is taken to show how your heart functions at rest.  A medicine is given through the IV access.  A second scan is done about 1 hour after the medicine injection and determines how your heart functions under stress.  During this stress phase, you will be connected to an electrocardiogram machine. Your blood pressure and oxygen levels will be monitored. What to expect after the  procedure  Your heart rate and blood pressure will be monitored after the test.  You may return to your normal schedule, including diet,activities, and medicines, unless your health care provider tells you otherwise. This information is not intended to replace advice given to you by your health care provider. Make sure you discuss any questions you have with your health care provider. Document Released: 04/07/2009 Document Revised: 04/26/2016 Document Reviewed: 05/28/2016 Elsevier Interactive Patient Education  2017 [employer].

## 2017-03-15 ENCOUNTER — Encounter (INDEPENDENT_AMBULATORY_CARE_PROVIDER_SITE_OTHER): Payer: Self-pay | Admitting: Vascular Surgery

## 2017-03-15 ENCOUNTER — Telehealth: Payer: Self-pay | Admitting: Cardiology

## 2017-03-15 ENCOUNTER — Ambulatory Visit (INDEPENDENT_AMBULATORY_CARE_PROVIDER_SITE_OTHER): Payer: Medicare Other

## 2017-03-15 ENCOUNTER — Encounter (INDEPENDENT_AMBULATORY_CARE_PROVIDER_SITE_OTHER): Payer: Self-pay

## 2017-03-15 ENCOUNTER — Other Ambulatory Visit: Payer: Self-pay

## 2017-03-15 ENCOUNTER — Ambulatory Visit (INDEPENDENT_AMBULATORY_CARE_PROVIDER_SITE_OTHER): Payer: Medicare Other | Admitting: Vascular Surgery

## 2017-03-15 VITALS — BP 123/69 | HR 94 | Resp 16 | Wt 278.2 lb

## 2017-03-15 DIAGNOSIS — R0602 Shortness of breath: Secondary | ICD-10-CM

## 2017-03-15 DIAGNOSIS — E1122 Type 2 diabetes mellitus with diabetic chronic kidney disease: Secondary | ICD-10-CM

## 2017-03-15 DIAGNOSIS — I10 Essential (primary) hypertension: Secondary | ICD-10-CM | POA: Diagnosis not present

## 2017-03-15 DIAGNOSIS — N186 End stage renal disease: Secondary | ICD-10-CM

## 2017-03-15 DIAGNOSIS — R9431 Abnormal electrocardiogram [ECG] [EKG]: Secondary | ICD-10-CM

## 2017-03-15 DIAGNOSIS — Z992 Dependence on renal dialysis: Secondary | ICD-10-CM

## 2017-03-15 NOTE — Telephone Encounter (Signed)
Patient here to have echocardiogram and was able to schedule her stress test. Scheduled her 03/19/17 & 03/20/17 at 10:30 AM both days. Reviewed all instructions for stress test and both her and spouse verbalized understanding with no further questions.

## 2017-03-15 NOTE — Assessment & Plan Note (Signed)
The patient has better looking scan of her AV fistula, but that is only because it has not been accessed in about a month now. 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 This will be performed as soon as she gets cardiac clearance and is felt to be safe surgically. Her skin is certainly intact and I'm ready do this at any point. This will be scheduled once we hear back from cardiology on the results of her stress Myoview.

## 2017-03-15 NOTE — Progress Notes (Signed)
MRN : [id number]  [name] is a 61 y.o. (02-04-1956) female who presents with chief complaint of  Chief Complaint  Patient presents with  . Follow-up  .  History of Present Illness: Patient returns today in follow up of her dialysis access.  She reports doing reasonably well without any major issues since she has been using her catheter for several weeks. She was scheduled for a surgical revision of her left upper arm AV fistula due to aneurysmal degeneration and skin breakdown, but this was delayed as she had not gotten cardiac clearance. We are still awaiting full cardiac clearance and she is scheduled for a stress Myoview study next week. She has no other complaints today.  Current Outpatient Prescriptions  Medication Sig Dispense Refill  . acetaminophen (TYLENOL 8 HOUR) 650 MG CR tablet Take 650 mg by mouth every 8 (eight) hours as needed for pain.    Marland Kitchen albuterol (PROVENTIL HFA;VENTOLIN HFA) 108 (90 Base) MCG/ACT inhaler Inhale 2 puffs into the lungs every 6 (six) hours as needed for wheezing or shortness of breath.    . allopurinol (ZYLOPRIM) 100 MG tablet Take 150 mg by mouth Every Tuesday,Thursday,and Saturday with dialysis.     Marland Kitchen amitriptyline (ELAVIL) 25 MG tablet Take 25 mg by mouth at bedtime.     Marland Kitchen amLODipine (NORVASC) 10 MG tablet Take 10 mg by mouth daily.    Marland Kitchen aspirin 81 MG tablet Take 81 mg by mouth. Reported on 01/23/2016    . atorvastatin (LIPITOR) 40 MG tablet Take 40 mg by mouth daily at 12 noon.    . carboxymethylcellulose (REFRESH PLUS) 0.5 % SOLN Place 1-2 drops into both eyes 3 (three) times daily as needed (for dry eyes).    . citalopram (CELEXA) 40 MG tablet Take 40 mg by mouth daily.     . clopidogrel (PLAVIX) 75 MG tablet Take 75 mg by mouth daily.     . diclofenac sodium (VOLTAREN) 1 % GEL Apply 1 g topically 2 (two) times daily as needed (for pain.).    Marland Kitchen docusate sodium (STOOL SOFTENER) 100 MG capsule Take 200 mg by mouth daily.     . fluticasone  (FLONASE) 50 MCG/ACT nasal spray Place 2 sprays into both nostrils daily.     . GNP ALLERGY 25 MG tablet Take 25 mg by mouth at bedtime.   0  . hydroxypropyl methylcellulose / hypromellose (ISOPTO TEARS / GONIOVISC) 2.5 % ophthalmic solution Place 1-2 drops into both eyes 4 (four) times daily as needed for dry eyes.    . insulin glargine (LANTUS) 100 UNIT/ML injection Inject 15 Units into the skin daily.     Marland Kitchen lidocaine-prilocaine (EMLA) cream Apply 1 application topically as needed (prior to dialysis).    Marland Kitchen loratadine (CLARITIN) 10 MG tablet Take 10 mg by mouth at bedtime.    . Misc. Devices [facility]) MISC Frequency:PHARMDIR   Dosage:0.0     Instructions:  Note:Dx: Deconditioning with orthostatic hypotension Dose: 1    . nystatin (MYCOSTATIN/NYSTOP) powder Apply 1 g topically daily as needed (for skin rashes.).     Marland Kitchen omega-3 acid ethyl esters (LOVAZA) 1 g capsule Take 1 g by mouth 2 (two) times daily. NOON & BEDTIME    . OXYGEN Inhale 2 L into the lungs at bedtime.    . polyethylene glycol powder (GLYCOLAX/MIRALAX) powder Take 17 g by mouth daily. With coffee    . predniSONE (DELTASONE) 50 MG tablet Take 50 mg by mouth as directed.  Take 1 tab 13 hours before procedure, 1 TAB 7 HOURS PRIOR, THEN 1 TAB 1 HOUR PRIOR TO PROCEDURE  0  . ranitidine (ZANTAC) 150 MG tablet Take 150 mg by mouth daily.   0  . sevelamer (RENAGEL) 800 MG tablet Take 800 mg by mouth 3 (three) times daily.     Marland Kitchen VICTOZA 18 MG/3ML SOPN Inject 12 mg into the skin daily.      No current facility-administered medications for this visit.     Past Medical History:  Diagnosis Date  . Anemia   . Anxiety   . Arthritis   . Cancer [facility])    Left Kidney Cancer  . CHF (congestive heart failure) ([place])   . Chronic kidney disease   . COPD (chronic obstructive pulmonary disease) ([facility])    Use Oxygen at bedtime  . Diabetes mellitus without complication ([place])   . Dialysis patient ([place])    Tues, Thurs, Sat  . Dyspnea    with  exertion  . GERD (gastroesophageal reflux disease)   . Gout   . History of kidney stones   . Hypertension   . Neuropathy ([place])   . Peripheral vascular disease ([place])   . Sleep apnea    OSA--USE C-PAP  . Stroke [facility]) 2004    Past Surgical History:  Procedure Laterality Date  . ABDOMINAL HYSTERECTOMY    . DIALYSIS/PERMA CATHETER INSERTION N/A 01/31/2017   Procedure: Dialysis/Perma Catheter Insertion;  Surgeon: Algernon Huxley, MD;  Location: [place] [facility];  Service: Cardiovascular;  Laterality: N/A;  . DIALYSIS/PERMA CATHETER INSERTION N/A 02/21/2017   Procedure: Dialysis/Perma Catheter Insertion;  Surgeon: Algernon Huxley, MD;  Location: [place] [facility];  Service: Cardiovascular;  Laterality: N/A;  . EYE SURGERY Bilateral    Cataract Extraction with IOL  . KIDNEY SURGERY Left    Partial Nephrectomy  . PERIPHERAL VASCULAR CATHETERIZATION N/A 01/23/2016   Procedure: Dialysis/Perma Catheter Insertion;  Surgeon: Algernon Huxley, MD;  Location: [place] [facility];  Service: Cardiovascular;  Laterality: N/A;  . PERIPHERAL VASCULAR CATHETERIZATION Left 10/01/2016   Procedure: A/V Shuntogram/Fistulagram;  Surgeon: Algernon Huxley, MD;  Location: [place] [facility];  Service: Cardiovascular;  Laterality: Left;    Social History Social History  Substance Use Topics  . Smoking status: Never Smoker  . Smokeless tobacco: Never Used  . Alcohol use No    Family History Family History  Problem Relation Age of Onset  . Hypertension Mother   . Heart failure Mother   . Diabetes Father   . Heart attack Father   . Lung cancer Maternal Aunt   . Cancer Maternal Aunt     Allergies  Allergen Reactions  . Iodine Rash  . Enalapril Other (See Comments)    TONGUE SWELLS/GOUT  . Iodinated Diagnostic Agents Other (See Comments)    BREAK OUT IN WHELPS       REVIEW OF SYSTEMS (Negative unless checked)  Constitutional: '[]' Weight loss  '[]' Fever  '[]' Chills Cardiac: '[]' Chest pain   '[]' Chest pressure    '[]' Palpitations   '[]' Shortness of breath when laying flat   '[]' Shortness of breath at rest   '[x]' Shortness of breath with exertion. Vascular:  '[]' Pain in legs with walking   '[]' Pain in legs at rest   '[]' Pain in legs when laying flat   '[]' Claudication   '[]' Pain in feet when walking  '[]' Pain in feet at rest  '[]' Pain in feet when laying flat   '[]' History of DVT   '[]' Phlebitis   '[]'   Swelling in legs   '[]' Varicose veins   '[]' Non-healing ulcers Pulmonary:   '[]' Uses home oxygen   '[]' Productive cough   '[]' Hemoptysis   '[]' Wheeze  '[]' COPD   '[]' Asthma Neurologic:  '[]' Dizziness  '[]' Blackouts   '[]' Seizures   '[]' History of stroke   '[]' History of TIA  '[]' Aphasia   '[]' Temporary blindness   '[]' Dysphagia   '[]' Weakness or numbness in arms   '[]' Weakness or numbness in legs Musculoskeletal:  '[]' Arthritis   '[x]' Joint swelling   '[]' Joint pain   '[]' Low back pain Hematologic:  '[]' Easy bruising  '[]' Easy bleeding   '[]' Hypercoagulable state   '[x]' Anemic   Gastrointestinal:  '[]' Blood in stool   '[]' Vomiting blood  '[]' Gastroesophageal reflux/heartburn   '[]' Abdominal pain Genitourinary:  '[x]' Chronic kidney disease   '[]' Difficult urination  '[]' Frequent urination  '[]' Burning with urination   '[]' Hematuria Skin:  '[]' Rashes   '[]' Ulcers   '[]' Wounds Psychological:  '[]' History of anxiety   '[]'  History of major depression.  Physical Examination  BP 123/69   Pulse 94   Resp 16   Wt 126.2 kg (278 lb 3.2 oz)   BMI 44.90 kg/m  Gen:  WD/WN, NAD. Morbidly obese. Head: [place]/AT, No temporalis wasting. Ear/Nose/Throat: Hearing grossly intact, nares w/o erythema or drainage, trachea midline Eyes: Conjunctiva clear. Sclera non-icteric Neck: Supple.  No JVD.  Pulmonary:  Good air movement, no use of accessory muscles.  Cardiac: RRR, normal S1, S2 Vascular: AVF with good thrill. Aneurysmal area persist but skin is healthier appearing. Permcath in place in femoral location without erythema Vessel Right Left  Radial Palpable Palpable  Ulnar Palpable Palpable  Brachial Palpable Palpable  Carotid  Palpable, without bruit Palpable, without bruit  Aorta Not palpable N/A  Femoral Palpable Palpable  Popliteal Palpable Palpable  PT Palpable Palpable  DP Palpable Palpable   Gastrointestinal: soft, non-tender/non-distended. No guarding/reflex.  Musculoskeletal: M/S 5/5 throughout.  No deformity or atrophy. Neurologic: Sensation grossly intact in extremities.  Symmetrical.  Speech is fluent.  Psychiatric: Judgment intact, Mood & affect appropriate for pt's clinical situation. Dermatologic: No rashes or ulcers noted.  No cellulitis or open wounds.       Labs Recent Results (from the past 2160 hour(s))  Potassium [facility] vascular lab only)     Status: None   Collection Time: 01/31/17 10:00 AM  Result Value Ref Range   Potassium [facility] vascular lab) 4.2 3.5 - 5.1  Glucose, capillary     Status: Abnormal   Collection Time: 02/21/17  1:28 PM  Result Value Ref Range   Glucose-Capillary 114 (H) 65 - 99 mg/dL  APTT     Status: None   Collection Time: 02/25/17  8:44 AM  Result Value Ref Range   aPTT 31 24 - 36 seconds  Basic metabolic panel     Status: Abnormal   Collection Time: 02/25/17  8:44 AM  Result Value Ref Range   Sodium 136 135 - 145 mmol/L   Potassium 3.9 3.5 - 5.1 mmol/L   Chloride 100 (L) 101 - 111 mmol/L   CO2 26 22 - 32 mmol/L   Glucose, Bld 94 65 - 99 mg/dL   BUN 53 (H) 6 - 20 mg/dL   Creatinine, Ser 9.55 (H) 0.44 - 1.00 mg/dL   Calcium 8.3 (L) 8.9 - 10.3 mg/dL   GFR calc non Af Amer 4 (L) >60 mL/min   GFR calc Af Amer 4 (L) >60 mL/min    Comment: (NOTE) The eGFR has been calculated using the CKD EPI equation. This calculation has not been  validated in all clinical situations. eGFR's persistently <60 mL/min signify possible Chronic Kidney Disease.    Anion gap 10 5 - 15  CBC WITH DIFFERENTIAL     Status: Abnormal   Collection Time: 02/25/17  8:44 AM  Result Value Ref Range   WBC 5.4 3.6 - 11.0 K/uL   RBC 3.99 3.80 - 5.20 MIL/uL   Hemoglobin 11.9 (L) 12.0 -  16.0 g/dL   HCT 35.9 35.0 - 47.0 %   MCV 89.9 80.0 - 100.0 fL   MCH 29.7 26.0 - 34.0 pg   MCHC 33.1 32.0 - 36.0 g/dL   RDW 15.5 (H) 11.5 - 14.5 %   Platelets 234 150 - 440 K/uL   Neutrophils Relative % 54 %   Neutro Abs 3.0 1.4 - 6.5 K/uL   Lymphocytes Relative 34 %   Lymphs Abs 1.8 1.0 - 3.6 K/uL   Monocytes Relative 9 %   Monocytes Absolute 0.5 0.2 - 0.9 K/uL   Eosinophils Relative 2 %   Eosinophils Absolute 0.1 0 - 0.7 K/uL   Basophils Relative 1 %   Basophils Absolute 0.0 0 - 0.1 K/uL  Protime-INR     Status: None   Collection Time: 02/25/17  8:44 AM  Result Value Ref Range   Prothrombin Time 13.4 11.4 - 15.2 seconds   INR 1.02   Type and screen     Status: None   Collection Time: 02/25/17  8:44 AM  Result Value Ref Range   ABO/RH(D) A POS    Antibody Screen NEG    Sample Expiration 03/11/2017    Extend sample reason NO TRANSFUSIONS OR PREGNANCY IN THE PAST 3 MONTHS   Surgical pcr screen     Status: None   Collection Time: 02/25/17  8:44 AM  Result Value Ref Range   MRSA, PCR NEGATIVE NEGATIVE   Staphylococcus aureus NEGATIVE NEGATIVE    Comment:        The Xpert SA Assay (FDA approved for NASAL specimens in patients over 93 years of age), is one component of a comprehensive surveillance program.  Test performance has been validated by [facility] for patients greater than or equal to 22 year old. It is not intended to diagnose infection nor to guide or monitor treatment.     Radiology No results found.    Assessment/Plan BP (high blood pressure) blood pressure control important in reducing the progression of atherosclerotic disease. On appropriate oral medications.   Diabetes blood glucose control important in reducing the progression of atherosclerotic disease. Also, involved in wound healing. On appropriate medications.  End stage kidney disease The patient has better looking scan of her AV fistula, but that is only because it has not been  accessed in about a month now. 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 This will be performed as soon as she gets cardiac clearance and is felt to be safe surgically. Her skin is certainly intact and I'm ready do this at any point. This will be scheduled once we hear back from cardiology on the results of her stress Myoview.    Leotis Pain, MD  03/15/2017 2:43 PM    This note was created with Dragon medical transcription system.  Any errors from dictation are purely unintentional

## 2017-03-18 ENCOUNTER — Telehealth: Payer: Self-pay | Admitting: *Deleted

## 2017-03-18 ENCOUNTER — Telehealth (INDEPENDENT_AMBULATORY_CARE_PROVIDER_SITE_OTHER): Payer: Self-pay

## 2017-03-18 NOTE — Telephone Encounter (Signed)
Spoke with the patient and she has eaten, but she seems to think she hit it while she was sleeping and let her know to have the nurse at dialysis evaluate the access and let us know if something needs to be changed and if it gets worse to call us or go to the ED for evaluation.

## 2017-03-18 NOTE — Telephone Encounter (Signed)
-----   Message from Wende Bushy, MD sent at 03/16/2017  1:07 PM EDT ----- Will await nuclear stress test

## 2017-03-18 NOTE — Telephone Encounter (Signed)
Patient's husband called stating that it looks like her access is pushed out further. He stated that yesterday when she got up there was some blood on the access and it looked further out, he wants to know what to do.

## 2017-03-18 NOTE — Telephone Encounter (Signed)
Spoke with patient and confirmed her scheduled stress test and instructions for tomorrow. Also let her know that Dr. Yvone Neu wants to wait on the echo results until she can compare with the stress images. She verbalized understanding and had no further questions at this time.

## 2017-03-19 ENCOUNTER — Emergency Department
Admission: EM | Admit: 2017-03-19 | Discharge: 2017-03-19 | Disposition: A | Discharge status: Home / Self Care | Payer: Medicare Other | Attending: Student in an Organized Health Care Education/Training Program | Admitting: Student in an Organized Health Care Education/Training Program

## 2017-03-19 ENCOUNTER — Encounter
Admission: EM | Disposition: A | Discharge status: Home / Self Care | Payer: Self-pay | Source: Home / Self Care | Attending: Student in an Organized Health Care Education/Training Program

## 2017-03-19 ENCOUNTER — Encounter: Payer: Self-pay | Admitting: Emergency Medicine

## 2017-03-19 ENCOUNTER — Ambulatory Visit: Payer: Medicare Other

## 2017-03-19 ENCOUNTER — Telehealth: Payer: Self-pay | Admitting: Cardiology

## 2017-03-19 DIAGNOSIS — Z833 Family history of diabetes mellitus: Secondary | ICD-10-CM | POA: Diagnosis not present

## 2017-03-19 DIAGNOSIS — M109 Gout, unspecified: Secondary | ICD-10-CM | POA: Diagnosis not present

## 2017-03-19 DIAGNOSIS — Z87442 Personal history of urinary calculi: Secondary | ICD-10-CM | POA: Diagnosis not present

## 2017-03-19 DIAGNOSIS — N186 End stage renal disease: Secondary | ICD-10-CM | POA: Diagnosis not present

## 2017-03-19 DIAGNOSIS — I10 Essential (primary) hypertension: Secondary | ICD-10-CM | POA: Diagnosis not present

## 2017-03-19 DIAGNOSIS — Z905 Acquired absence of kidney: Secondary | ICD-10-CM | POA: Diagnosis not present

## 2017-03-19 DIAGNOSIS — Z9889 Other specified postprocedural states: Secondary | ICD-10-CM | POA: Diagnosis not present

## 2017-03-19 DIAGNOSIS — D631 Anemia in chronic kidney disease: Secondary | ICD-10-CM | POA: Insufficient documentation

## 2017-03-19 DIAGNOSIS — E1122 Type 2 diabetes mellitus with diabetic chronic kidney disease: Secondary | ICD-10-CM | POA: Insufficient documentation

## 2017-03-19 DIAGNOSIS — M1990 Unspecified osteoarthritis, unspecified site: Secondary | ICD-10-CM | POA: Insufficient documentation

## 2017-03-19 DIAGNOSIS — T82868A Thrombosis of vascular prosthetic devices, implants and grafts, initial encounter: Secondary | ICD-10-CM

## 2017-03-19 DIAGNOSIS — Z9849 Cataract extraction status, unspecified eye: Secondary | ICD-10-CM | POA: Diagnosis not present

## 2017-03-19 DIAGNOSIS — G4733 Obstructive sleep apnea (adult) (pediatric): Secondary | ICD-10-CM | POA: Diagnosis not present

## 2017-03-19 DIAGNOSIS — E1151 Type 2 diabetes mellitus with diabetic peripheral angiopathy without gangrene: Secondary | ICD-10-CM | POA: Diagnosis not present

## 2017-03-19 DIAGNOSIS — Z8673 Personal history of transient ischemic attack (TIA), and cerebral infarction without residual deficits: Secondary | ICD-10-CM | POA: Insufficient documentation

## 2017-03-19 DIAGNOSIS — I132 Hypertensive heart and chronic kidney disease with heart failure and with stage 5 chronic kidney disease, or end stage renal disease: Secondary | ICD-10-CM | POA: Insufficient documentation

## 2017-03-19 DIAGNOSIS — I509 Heart failure, unspecified: Secondary | ICD-10-CM | POA: Insufficient documentation

## 2017-03-19 DIAGNOSIS — Z85528 Personal history of other malignant neoplasm of kidney: Secondary | ICD-10-CM | POA: Insufficient documentation

## 2017-03-19 DIAGNOSIS — Z9981 Dependence on supplemental oxygen: Secondary | ICD-10-CM | POA: Insufficient documentation

## 2017-03-19 DIAGNOSIS — Z801 Family history of malignant neoplasm of trachea, bronchus and lung: Secondary | ICD-10-CM | POA: Insufficient documentation

## 2017-03-19 DIAGNOSIS — Z809 Family history of malignant neoplasm, unspecified: Secondary | ICD-10-CM | POA: Insufficient documentation

## 2017-03-19 DIAGNOSIS — Z90710 Acquired absence of both cervix and uterus: Secondary | ICD-10-CM | POA: Insufficient documentation

## 2017-03-19 DIAGNOSIS — Z8249 Family history of ischemic heart disease and other diseases of the circulatory system: Secondary | ICD-10-CM | POA: Insufficient documentation

## 2017-03-19 DIAGNOSIS — Z888 Allergy status to other drugs, medicaments and biological substances status: Secondary | ICD-10-CM | POA: Insufficient documentation

## 2017-03-19 DIAGNOSIS — K219 Gastro-esophageal reflux disease without esophagitis: Secondary | ICD-10-CM | POA: Insufficient documentation

## 2017-03-19 DIAGNOSIS — Z992 Dependence on renal dialysis: Secondary | ICD-10-CM | POA: Insufficient documentation

## 2017-03-19 DIAGNOSIS — T8242XA Displacement of vascular dialysis catheter, initial encounter: Secondary | ICD-10-CM

## 2017-03-19 DIAGNOSIS — J449 Chronic obstructive pulmonary disease, unspecified: Secondary | ICD-10-CM | POA: Insufficient documentation

## 2017-03-19 HISTORY — PX: DIALYSIS/PERMA CATHETER INSERTION: CATH118288

## 2017-03-19 LAB — CBC
HCT: 37 % (ref 35.0–47.0)
Hemoglobin: 12.2 g/dL (ref 12.0–16.0)
MCH: 30.1 pg (ref 26.0–34.0)
MCHC: 33 g/dL (ref 32.0–36.0)
MCV: 91 fL (ref 80.0–100.0)
Platelets: 287 10*3/uL (ref 150–440)
RBC: 4.07 MIL/uL (ref 3.80–5.20)
RDW: 15.6 % — ABNORMAL HIGH (ref 11.5–14.5)
WBC: 6.8 10*3/uL (ref 3.6–11.0)

## 2017-03-19 LAB — BASIC METABOLIC PANEL
Anion gap: 12 (ref 5–15)
BUN: 64 mg/dL — ABNORMAL HIGH (ref 6–20)
CO2: 25 mmol/L (ref 22–32)
Calcium: 9 mg/dL (ref 8.9–10.3)
Chloride: 96 mmol/L — ABNORMAL LOW (ref 101–111)
Creatinine, Ser: 12.05 mg/dL — ABNORMAL HIGH (ref 0.44–1.00)
GFR calc Af Amer: 3 mL/min — ABNORMAL LOW (ref >60)
GFR calc non Af Amer: 3 mL/min — ABNORMAL LOW (ref >60)
Glucose, Bld: 104 mg/dL — ABNORMAL HIGH (ref 65–99)
Potassium: 5.1 mmol/L (ref 3.5–5.1)
Sodium: 133 mmol/L — ABNORMAL LOW (ref 135–145)

## 2017-03-19 LAB — GLUCOSE, CAPILLARY
Glucose-Capillary: 88 mg/dL (ref 65–99)
Glucose-Capillary: 89 mg/dL (ref 65–99)

## 2017-03-19 SURGERY — DIALYSIS/PERMA CATHETER INSERTION
Anesthesia: Moderate Sedation

## 2017-03-19 MED ORDER — METHYLPREDNISOLONE SODIUM SUCC 125 MG IJ SOLR
INTRAMUSCULAR | Status: AC
  Filled 2017-03-19: qty 2

## 2017-03-19 MED ORDER — DIPHENHYDRAMINE HCL 50 MG/ML IJ SOLN
INTRAMUSCULAR | Status: DC | PRN
  Administered 2017-03-19: 50 mg via INTRAVENOUS

## 2017-03-19 MED ORDER — HEPARIN (PORCINE) IN NACL 2-0.9 UNIT/ML-% IJ SOLN
INTRAMUSCULAR | Status: AC
  Filled 2017-03-19: qty 500

## 2017-03-19 MED ORDER — DIPHENHYDRAMINE HCL 50 MG/ML IJ SOLN
INTRAMUSCULAR | Status: AC
  Filled 2017-03-19: qty 1

## 2017-03-19 MED ORDER — MIDAZOLAM HCL 2 MG/2ML IJ SOLN
INTRAMUSCULAR | Status: AC
  Filled 2017-03-19: qty 4

## 2017-03-19 MED ORDER — METHYLPREDNISOLONE SODIUM SUCC 125 MG IJ SOLR
125.0000 mg | Freq: Once | INTRAMUSCULAR | Status: AC
  Administered 2017-03-19: 125 mg via INTRAVENOUS

## 2017-03-19 MED ORDER — CEFAZOLIN SODIUM-DEXTROSE 2-4 GM/100ML-% IV SOLN: 2.0000 g | Freq: Once | INTRAVENOUS | Status: DC

## 2017-03-19 MED ORDER — MIDAZOLAM HCL 2 MG/2ML IJ SOLN
INTRAMUSCULAR | Status: DC | PRN
  Administered 2017-03-19: 2 mg via INTRAVENOUS

## 2017-03-19 MED ORDER — FENTANYL CITRATE (PF) 100 MCG/2ML IJ SOLN
INTRAMUSCULAR | Status: AC
  Filled 2017-03-19: qty 2

## 2017-03-19 MED ORDER — DEXTROSE 5 % IV SOLN
2.0000 g | Freq: Once | INTRAVENOUS | Status: AC
  Administered 2017-03-19: 2 g via INTRAVENOUS
  Filled 2017-03-19: qty 2000

## 2017-03-19 MED ORDER — HEPARIN SODIUM (PORCINE) 10000 UNIT/ML IJ SOLN
INTRAMUSCULAR | Status: AC
  Filled 2017-03-19: qty 1

## 2017-03-19 MED ORDER — FENTANYL CITRATE (PF) 100 MCG/2ML IJ SOLN
INTRAMUSCULAR | Status: DC | PRN
  Administered 2017-03-19: 50 ug via INTRAVENOUS

## 2017-03-19 MED ORDER — LIDOCAINE HCL (PF) 1 % IJ SOLN
INTRAMUSCULAR | Status: AC
  Filled 2017-03-19: qty 5

## 2017-03-19 SURGICAL SUPPLY — 8 items
CATH PALINDROME RT-P 15FX55CM (CATHETERS) ×2 IMPLANT
DERMABOND ADVANCED (GAUZE/BANDAGES/DRESSINGS) ×1
DERMABOND ADVANCED .7 DNX12 (GAUZE/BANDAGES/DRESSINGS) ×1 IMPLANT
GLIDEWIRE STIFF .35X180X3 HYDR (WIRE) ×2 IMPLANT
NEEDLE ENTRY 21GA 7CM ECHOTIP (NEEDLE) ×2 IMPLANT
PACK ANGIOGRAPHY (CUSTOM PROCEDURE TRAY) ×2 IMPLANT
SET INTRO CAPELLA COAXIAL (SET/KITS/TRAYS/PACK) ×2 IMPLANT
SUT MNCRL AB 4-0 PS2 18 (SUTURE) ×4 IMPLANT

## 2017-03-19 NOTE — ED Provider Notes (Signed)
[facility] Emergency Department Provider Note    First MD Initiated Contact with Patient 03/19/17 0940     (approximate)  I have reviewed the triage vital signs and the nursing notes.   HISTORY  Chief Complaint Vascular Access Problem    HPI [name] is a 61 y.o. female who presents for evaluation of displaced tunnel catheter. Patient has end-stage renal disease on Tuesday Thursday Saturday dialysis. Went to have dialysis done today and nursing there noted that the dialysis catheter had been displaced. Husband and patient state that they think that it had started moving last night. Has had scant bleeding. Otherwise is asymptomatic. Has not had anything to eat.   Past Medical History:  Diagnosis Date  . Anemia   . Anxiety   . Arthritis   . Cancer [facility])    Left Kidney Cancer  . CHF (congestive heart failure) ([place])   . Chronic kidney disease   . COPD (chronic obstructive pulmonary disease) (HCC)    Use Oxygen at bedtime  . Diabetes mellitus without complication ([place])   . Dialysis patient ([facility])    Tues, Thurs, Sat  . Dyspnea    with exertion  . GERD (gastroesophageal reflux disease)   . Gout   . History of kidney stones   . Hypertension   . Neuropathy   . Peripheral vascular disease ([place])   . Sleep apnea    OSA--USE C-PAP  . Stroke [facility]) 2004   Family History  Problem Relation Age of Onset  . Hypertension Mother   . Heart failure Mother   . Diabetes Father   . Heart attack Father   . Lung cancer Maternal Aunt   . Cancer Maternal Aunt    Past Surgical History:  Procedure Laterality Date  . ABDOMINAL HYSTERECTOMY    . DIALYSIS/PERMA CATHETER INSERTION N/A 01/31/2017   Procedure: Dialysis/Perma Catheter Insertion;  Surgeon: Algernon Huxley, MD;  Location: [place] CV LAB;  Service: Cardiovascular;  Laterality: N/A;  . DIALYSIS/PERMA CATHETER INSERTION N/A 02/21/2017   Procedure: Dialysis/Perma Catheter Insertion;  Surgeon: Algernon Huxley, MD;  Location: [place] CV LAB;  Service: Cardiovascular;  Laterality: N/A;  . EYE SURGERY Bilateral    Cataract Extraction with IOL  . KIDNEY SURGERY Left    Partial Nephrectomy  . PERIPHERAL VASCULAR CATHETERIZATION N/A 01/23/2016   Procedure: Dialysis/Perma Catheter Insertion;  Surgeon: Algernon Huxley, MD;  Location: [place] CV LAB;  Service: Cardiovascular;  Laterality: N/A;  . PERIPHERAL VASCULAR CATHETERIZATION Left 10/01/2016   Procedure: A/V Shuntogram/Fistulagram;  Surgeon: Algernon Huxley, MD;  Location: [place] CV LAB;  Service: Cardiovascular;  Laterality: Left;   Patient Active Problem List   Diagnosis Date Noted  . Cerebrovascular accident, old 06/21/2014  . CAFL (chronic airflow limitation) ([place]) 04/09/2014  . Gout 02/16/2014  . BP (high blood pressure) 02/16/2014  . Diabetes ([place]) 09/28/2013  . Dermatophytoses 09/28/2013  . End stage kidney disease ([place]) 09/28/2013  . Biliary calculi 05/03/2011  . Idiopathic localized osteoarthropathy 02/12/2011      Prior to Admission medications   Medication Sig Start Date End Date Taking? Authorizing Provider  acetaminophen (TYLENOL 8 HOUR) 650 MG CR tablet Take 650 mg by mouth every 8 (eight) hours as needed for pain.    Historical Provider, MD  albuterol (PROVENTIL HFA;VENTOLIN HFA) 108 (90 Base) MCG/ACT inhaler Inhale 2 puffs into the lungs every 6 (six) hours as needed for wheezing or shortness of breath.  Historical Provider, MD  allopurinol (ZYLOPRIM) 100 MG tablet Take 150 mg by mouth Every Tuesday,Thursday,and Saturday with dialysis.     Historical Provider, MD  amitriptyline (ELAVIL) 25 MG tablet Take 25 mg by mouth at bedtime.  02/16/14   Historical Provider, MD  amLODipine (NORVASC) 10 MG tablet Take 10 mg by mouth daily.    Historical Provider, MD  aspirin 81 MG tablet Take 81 mg by mouth. Reported on 01/23/2016 02/16/14   Historical Provider, MD  atorvastatin (LIPITOR) 40 MG tablet Take 40 mg by mouth  daily at 12 noon. 09/07/16   Historical Provider, MD  carboxymethylcellulose (REFRESH PLUS) 0.5 % SOLN Place 1-2 drops into both eyes 3 (three) times daily as needed (for dry eyes).    Historical Provider, MD  citalopram (CELEXA) 40 MG tablet Take 40 mg by mouth daily.  02/16/14   Historical Provider, MD  clopidogrel (PLAVIX) 75 MG tablet Take 75 mg by mouth daily.  02/16/14   Historical Provider, MD  diclofenac sodium (VOLTAREN) 1 % GEL Apply 1 g topically 2 (two) times daily as needed (for pain.).    Historical Provider, MD  docusate sodium (STOOL SOFTENER) 100 MG capsule Take 200 mg by mouth daily.  02/16/14   Historical Provider, MD  fluticasone (FLONASE) 50 MCG/ACT nasal spray Place 2 sprays into both nostrils daily.  02/16/14   Historical Provider, MD  GNP ALLERGY 25 MG tablet Take 25 mg by mouth at bedtime.  09/26/16   Historical Provider, MD  hydroxypropyl methylcellulose / hypromellose (ISOPTO TEARS / GONIOVISC) 2.5 % ophthalmic solution Place 1-2 drops into both eyes 4 (four) times daily as needed for dry eyes.    Historical Provider, MD  insulin glargine (LANTUS) 100 UNIT/ML injection Inject 15 Units into the skin daily.  02/16/14   Historical Provider, MD  lidocaine-prilocaine (EMLA) cream Apply 1 application topically as needed (prior to dialysis).    Historical Provider, MD  loratadine (CLARITIN) 10 MG tablet Take 10 mg by mouth at bedtime.    Historical Provider, MD  Misc. Devices [facility]) MISC Frequency:PHARMDIR   Dosage:0.0     Instructions:  Note:Dx: Deconditioning with orthostatic hypotension Dose: 1 07/02/12   Historical Provider, MD  nystatin (MYCOSTATIN/NYSTOP) powder Apply 1 g topically daily as needed (for skin rashes.).  06/26/16   Historical Provider, MD  omega-3 acid ethyl esters (LOVAZA) 1 g capsule Take 1 g by mouth 2 (two) times daily. NOON & BEDTIME    Historical Provider, MD  OXYGEN Inhale 2 L into the lungs at bedtime.    Historical Provider, MD  polyethylene glycol  powder (GLYCOLAX/MIRALAX) powder Take 17 g by mouth daily. With coffee 06/12/14   Historical Provider, MD  predniSONE (DELTASONE) 50 MG tablet Take 50 mg by mouth as directed.  Take 1 tab 13 hours before procedure, 1 TAB 7 HOURS PRIOR, THEN 1 TAB 1 HOUR PRIOR TO PROCEDURE 09/26/16   Historical Provider, MD  ranitidine (ZANTAC) 150 MG tablet Take 150 mg by mouth daily.  09/26/16   Historical Provider, MD  sevelamer (RENAGEL) 800 MG tablet Take 800 mg by mouth 3 (three) times daily.  12/27/11   Historical Provider, MD  VICTOZA 18 MG/3ML SOPN Inject 12 mg into the skin daily.  09/07/16   Historical Provider, MD    Allergies Iodine; Enalapril; and Iodinated diagnostic agents    Social History Social History  Substance Use Topics  . Smoking status: Never Smoker  . Smokeless tobacco: Never Used  . Alcohol  use No    Review of Systems Patient denies headaches, rhinorrhea, blurry vision, numbness, shortness of breath, chest pain, edema, cough, abdominal pain, nausea, vomiting, diarrhea, dysuria, fevers, rashes or hallucinations unless otherwise stated above in HPI. ____________________________________________   PHYSICAL EXAM:  VITAL SIGNS: Vitals:   03/19/17 1014 03/19/17 1030  BP:  (!) 143/69  Pulse: 81 82  Resp:    Temp:      Constitutional: Alert and oriented. Well appearing and in no acute distress. Eyes: Conjunctivae are normal. PERRL. EOMI. Head: Atraumatic. Nose: No congestion/rhinnorhea. Mouth/Throat: Mucous membranes are moist.  Oropharynx non-erythematous. Neck: No stridor. Painless ROM. No cervical spine tenderness to palpation Hematological/Lymphatic/Immunilogical: No cervical lymphadenopathy. Cardiovascular: Normal rate, regular rhythm. Grossly normal heart sounds.  Good peripheral circulation. Respiratory: Normal respiratory effort.  No retractions. Lungs CTAB. Gastrointestinal: Soft and nontender. No distention. No abdominal bruits. No CVA tenderness. Musculoskeletal:  No lower extremity tenderness nor edema.  No joint effusions.  44cm tunneled right femoral catheter completely displaced.  No active bleeding. no surrounding erythema or swelling Neurologic:  Normal speech and language. No gross focal neurologic deficits are appreciated. No gait instability. Skin:  Skin is warm, dry and intact.  Right fem cath incision as above Psychiatric: Mood and affect are normal. Speech and behavior are normal.  ____________________________________________   LABS (all labs ordered are listed, but only abnormal results are displayed)  Results for orders placed or performed during the hospital encounter of 03/19/17 (from the past 24 hour(s))  Glucose, capillary     Status: None   Collection Time: 03/19/17  9:46 AM  Result Value Ref Range   Glucose-Capillary 88 65 - 99 mg/dL   Comment 1 Notify RN   Basic metabolic panel     Status: Abnormal   Collection Time: 03/19/17  9:56 AM  Result Value Ref Range   Sodium 133 (L) 135 - 145 mmol/L   Potassium 5.1 3.5 - 5.1 mmol/L   Chloride 96 (L) 101 - 111 mmol/L   CO2 25 22 - 32 mmol/L   Glucose, Bld 104 (H) 65 - 99 mg/dL   BUN 64 (H) 6 - 20 mg/dL   Creatinine, Ser 12.05 (H) 0.44 - 1.00 mg/dL   Calcium 9.0 8.9 - 10.3 mg/dL   GFR calc non Af Amer 3 (L) >60 mL/min   GFR calc Af Amer 3 (L) >60 mL/min   Anion gap 12 5 - 15  CBC     Status: Abnormal   Collection Time: 03/19/17  9:56 AM  Result Value Ref Range   WBC 6.8 3.6 - 11.0 K/uL   RBC 4.07 3.80 - 5.20 MIL/uL   Hemoglobin 12.2 12.0 - 16.0 g/dL   HCT 37.0 35.0 - 47.0 %   MCV 91.0 80.0 - 100.0 fL   MCH 30.1 26.0 - 34.0 pg   MCHC 33.0 32.0 - 36.0 g/dL   RDW 15.6 (H) 11.5 - 14.5 %   Platelets 287 150 - 440 K/uL   ____________________________________________ ____________________________________________  RADIOLOGY   ____________________________________________   PROCEDURES  Procedure(s) performed:  Procedures    Critical Care performed:  no ____________________________________________   INITIAL IMPRESSION / ASSESSMENT AND PLAN / ED COURSE  Pertinent labs & imaging results that were available during my care of the patient were reviewed by me and considered in my medical decision making (see chart for details).  DDX: Catheter displacement, hematoma, infection, electrolyte abnormality  [name] is a 61 y.o. who presents to the ED with  displaced right femoral dialysis catheter.  Patient is AFVSS in ED. Exam as above. Given current presentation have considered the above differential.  No evidence of active hemorrhage. No significant hematoma or swelling. Otherwise hemodynamically stable. Blood work shows no evidence of hyperkalemia or significant metabolic acidosis. No indication for emergent dialysis. Patient will need basement of dialysis catheter with vascular surgery.  I spoke with Dr. Franchot Gallo who plans to take patient to vascular suite today.  Have discussed with the patient and available family all diagnostics and treatments performed thus far and all questions were answered to the best of my ability. The patient demonstrates understanding and agreement with plan.       ____________________________________________   FINAL CLINICAL IMPRESSION(S) / ED DIAGNOSES  Final diagnoses:  Displacement of vascular dialysis catheter, initial encounter ([place])  ESRD on dialysis ([facility])      NEW MEDICATIONS STARTED DURING THIS VISIT:  New Prescriptions   No medications on file     Note:  This document was prepared using Dragon voice recognition software and may include unintentional dictation errors.    Merlyn Lot, MD 03/19/17 [phone]

## 2017-03-19 NOTE — ED Notes (Signed)
Pt given warm blanket, resting in bed in no distress

## 2017-03-19 NOTE — ED Notes (Signed)
Pt resting in bed, given warm blanket, made aware of pending transfer to the OR

## 2017-03-19 NOTE — Op Note (Signed)
[facility]   OPERATIVE NOTE     PROCEDURE: 1. Insertion of a left femoral tunneled dialysis catheter. 2. Catheter placement and cannulation under ultrasound and fluoroscopic guidance  PRE-OPERATIVE DIAGNOSIS: end-stage renal requiring hemodialysis  POST-OPERATIVE DIAGNOSIS: same as above  SURGEON: [name]  ANESTHESIA: Conscious sedation was administered under my direct supervision by the interventional radiology RN. IV Versed plus fentanyl were utilized. Continuous ECG, pulse oximetry and blood pressure was monitored throughout the entire procedure.  Conscious sedation was for a total of 23 minutes.  ESTIMATED BLOOD LOSS: Minimal  FINDING(S): 1.  Tips of the catheter in the right atrium on fluoroscopy 2.  No obvious pneumothorax on fluoroscopy  SPECIMEN(S):  none  INDICATIONS:   [name] is a 61 y.o. female  presents with end stage renal disease.  Therefore, the patient requires a tunneled dialysis catheter placement.  The patient is informed of  the risks catheter placement include but are not limited to: bleeding, infection, central venous injury, pneumothorax, possible venous stenosis, possible malpositioning in the venous system, and possible infections related to Jean-term catheter presence.  The patient was aware of these risks and agreed to proceed.  DESCRIPTION: The patient was taken back to Special Procedure suite.  Prior to sedation, the patient was given IV antibiotics.  After obtaining adequate sedation, the patient was prepped and draped in the standard fashion for a chest or neck tunneled dialysis catheter placement.  Appropriate Time Out is called.     The the left groin and thigh are then infiltrated with 1% Lidocaine with epinepherine.  A 52 tip to cuff Palindrome catheter is then selected, opened on the back table and prepped. Ultrasound is placed in a sterile sleeve.  Under ultrasound guidance, the left common femoral vein  is examined and is noted to be echolucent and easily compressible indicating patency.   An image is recorded for the permanent record.  The left common femoral  vein is cannulated with the microneedle under direct ultrasound vissualization.  A Microwire followed by a micro sheath is then inserted without difficulty.   A J-wire was then advanced under fluoroscopic guidance into the inferior vena cava and the wire was secured.  Small counter incision was then made at the wire insertion site. A small pocket was fashioned with blunt dissection to allow easier passage of the cuff.  The dilator and peel-away sheath are then advanced over the wire under fluoroscopic guidance. The catheters and advanced through the peel-away sheath. It is approximated to the chest wall after verifying the tips at the atrial caval junction and an exit site is selected.  Small incision is made at the selected exit site and the tunneling device was passed subcutaneously to the neck counter incision. Catheter is then pulled through the subcutaneous tunnel. The catheter is then verified for tip position under fluoroscopy, transected and the hub assembly connected.    Each port was tested by aspirating and flushing.  No resistance was noted.  Each port was then thoroughly flushed with heparinized saline.  The catheter was secured in placed with two interrupted stitches of 0 silk tied to the catheter.  The neck incision was closed with a U-stitch of 4-0 Monocryl.  The neck and chest incision were cleaned and sterile bandages applied including a Biopatch.  Each port was then packed with concentrated heparin (1000 Units/mL) at the manufacturer recommended volumes to each port.  Sterile caps were applied to each port.  On completion  fluoroscopy, the tips of the catheter were in the right atrium, and there was no evidence of pneumothorax.  COMPLICATIONS: None  CONDITION: Unchanged   [name] [place] [facility] Office:  [phone]   03/19/2017, 5:28 PM

## 2017-03-19 NOTE — Telephone Encounter (Signed)
Left voicemail message for patient to call back and reschedule testing.

## 2017-03-19 NOTE — Progress Notes (Signed)
Patient post perm cath placement per Dr Delana Meyer. Husband present, at bedside. Vitals stable. Dr Delana Meyer out to speak with patient and husband with questions answered, denies complaints.

## 2017-03-19 NOTE — ED Triage Notes (Signed)
Pt to ed with c/o vascular access problem.  Pt states access in right upper leg is coming out.  Pt went to have dialysis this am and was unable to get it.  Pt also reports small amount of bleeding from site.

## 2017-03-19 NOTE — Discharge Instructions (Signed)
Tunneled Catheter Insertion, Care After  Refer to this sheet in the next few weeks. These instructions provide you with information about caring for yourself after your procedure. Your health care provider may also give you more specific instructions. Your treatment has been planned according to current medical practices, but problems sometimes occur. Call your health care provider if you have any problems or questions after your procedure.  What can I expect after the procedure?  After the procedure, it is common to have:  · Some mild redness, swelling, and pain around your catheter site.  · A small amount of blood or clear fluid coming from your incisions.    Follow these instructions at home:  Incision care   · Check your incision areas every day for signs of infection. Check for:  ? More redness, swelling, or pain.  ? More fluid or blood.  ? Warmth.  ? Pus or a bad smell.  · Follow instructions from your health care provider about how to take care of your incisions. Make sure you:  ? Wash your hands with soap and water before you change your bandages (dressings). If soap and water are not available, use hand sanitizer.  ? Change your dressings as told by your health care provider. Wash the area around your incisions with a germ-killing (antiseptic) solution when you change your dressing, as told by your health care provider.  ? Leave stitches (sutures), skin glue, or adhesive strips in place. These skin closures may need to stay in place for 2 weeks or longer. If adhesive strip edges start to loosen and curl up, you may trim the loose edges. Do not remove adhesive strips completely unless your health care provider tells you to do that.  Catheter Care     · Wash your hands with soap and water before and after caring for your catheter. If soap and water are not available, use hand sanitizer.  · Keep your catheter site and your dressings clean and dry.  · Apply an antibiotic ointment to your catheter site as told  by your health care provider.  · Flush your catheter as told by your health care provider. This helps prevent it from becoming clogged.  · Do not open the caps on the ends of the catheter.  · Do not pull on your catheter.  · If your catheter is in your arm:  ? Avoid wearing tight clothes or tight jewelry on your arm that has the catheter.  ? Do not sleep with your head on the arm that has the catheter.  ? Do not allow your blood pressure to be taken on the arm that has the catheter.  ? Do not allow your blood to be drawn from the arm that has the catheter, except through the catheter itself.  Medicines   · Take over-the-counter and prescription medicines only as told by your health care provider.  · If you were prescribed an antibiotic medicine, take it as told by your health care provider. Do not stop taking the antibiotic even if you start to feel better.  Activity   · Return to your normal activities as told by your health care provider. Ask your health care provider what activities are safe for you.  · Do not lift anything that is heavier than 10 lb (4.5 kg) for 3 weeks or as [place] as told by your health care provider.  Driving   · Do not drive until your health care provider approves.  ·   Do not drive or operate heavy machinery while taking prescription pain medicine.  General instructions   · Follow your health care provider's specific instructions for the type of catheter that you have.  · Do not take baths, swim, or use a hot tub until your health care provider approves.  · Follow instructions from your health care provider about eating or drinking restrictions.  · Wear compression stockings as told by your health care provider. These stockings help to prevent blood clots and reduce swelling in your legs.  · Keep all follow-up visits as told by your health care provider. This is important.  Contact a health care provider if:  · You have more fluid or blood coming from your incisions.  · You have more redness,  swelling, or pain at your incisions or around the area where your catheter is inserted.  · Your incisions feel warm to the touch.  · You feel unusually weak.  · You feel nauseous.  · Your catheter is not working properly.  · You have blood or fluid draining from your catheter.  · You are unable to flush your catheter.  Get help right away if:  · Your catheter breaks.  · A hole develops in your catheter.  · Your catheter comes loose or gets pulled completely out. If this happens, press on your catheter site firmly with your hand or a clean cloth until you get medical help.  · Your catheter becomes blocked.  · You have swelling in your arm, shoulder, neck, or face.  · You develop chest pain.  · You have difficulty breathing.  · You feel dizzy or light-headed.  · You have pus or a bad smell coming from your incisions.  · You have a fever.  · You develop bleeding from your catheter or your insertion site, and your bleeding does not stop.  This information is not intended to replace advice given to you by your health care provider. Make sure you discuss any questions you have with your health care provider.  Document Released: 11/05/2012 Document Revised: 07/22/2016 Document Reviewed: 08/15/2015  [employer]vier Interactive Patient Education © 2017 [employer]

## 2017-03-19 NOTE — H&P (Signed)
[place] [facility] Admission History & Physical  MRN : [id number]  [name] is a 61 y.o. (1956/03/31) female who presents with chief complaint of  Chief Complaint  Patient presents with  . Vascular Access Problem  .  History of Present Illness: I am asked to evaluate the patient by the dialysis center. The patient was sent here because she pulled out her tunneled catheter last night.  The patient reports they've not been any problems with any of their dialysis runs. They are reporting good flows with good parameters at dialysis.  Patient denies pain or tenderness overlying the access.  There is no pain with dialysis.    No fevers or chills while on dialysis.   Current Facility-Administered Medications  Medication Dose Route Frequency Provider Last Rate Last Dose  . [MAR Hold] ceFAZolin (ANCEF) 2 g in dextrose 5 % 100 mL IVPB  2 g Intravenous Once Merlyn Lot, MD 240 mL/hr at 03/19/17 1633 2 g at 03/19/17 1633    Past Medical History:  Diagnosis Date  . Anemia   . Anxiety   . Arthritis   . Cancer [facility])    Left Kidney Cancer  . CHF (congestive heart failure) ([place])   . Chronic kidney disease   . COPD (chronic obstructive pulmonary disease) (H[facility])    Use Oxygen at bedtime  . Diabetes mellitus without complication ([place])   . Dialysis patient ([place])    Tues, Thurs, Sat  . Dyspnea    with exertion  . GERD (gastroesophageal reflux disease)   . Gout   . History of kidney stones   . Hypertension   . Neuropathy   . Peripheral vascular disease ([place])   . Sleep apnea    OSA--USE C-PAP  . Stroke [facility]) 2004    Past Surgical History:  Procedure Laterality Date  . ABDOMINAL HYSTERECTOMY    . DIALYSIS/PERMA CATHETER INSERTION N/A 01/31/2017   Procedure: Dialysis/Perma Catheter Insertion;  Surgeon: Algernon Huxley, MD;  Location: [place] [facility];  Service: Cardiovascular;  Laterality: N/A;  . DIALYSIS/PERMA CATHETER INSERTION N/A 02/21/2017   Procedure:  Dialysis/Perma Catheter Insertion;  Surgeon: Algernon Huxley, MD;  Location: [place] [facility];  Service: Cardiovascular;  Laterality: N/A;  . EYE SURGERY Bilateral    Cataract Extraction with IOL  . KIDNEY SURGERY Left    Partial Nephrectomy  . PERIPHERAL VASCULAR CATHETERIZATION N/A 01/23/2016   Procedure: Dialysis/Perma Catheter Insertion;  Surgeon: Algernon Huxley, MD;  Location: [place] [facility];  Service: Cardiovascular;  Laterality: N/A;  . PERIPHERAL VASCULAR CATHETERIZATION Left 10/01/2016   Procedure: A/V Shuntogram/Fistulagram;  Surgeon: Algernon Huxley, MD;  Location: [place] CV LAB;  Service: Cardiovascular;  Laterality: Left;    Social History Social History  Substance Use Topics  . Smoking status: Never Smoker  . Smokeless tobacco: Never Used  . Alcohol use No    Family History Family History  Problem Relation Age of Onset  . Hypertension Mother   . Heart failure Mother   . Diabetes Father   . Heart attack Father   . Lung cancer Maternal Aunt   . Cancer Maternal Aunt     No family history of bleeding or clotting disorders, autoimmune disease or porphyria  Allergies  Allergen Reactions  . Iodine Rash  . Enalapril Other (See Comments)    TONGUE SWELLS/GOUT  . Iodinated Diagnostic Agents Other (See Comments)    BREAK OUT IN WHELPS  REVIEW OF SYSTEMS (Negative unless checked)  Constitutional: [] Weight loss  [] Fever  [] Chills Cardiac: [] Chest pain   [] Chest pressure   [] Palpitations   [] Shortness of breath when laying flat   [] Shortness of breath at rest   [x] Shortness of breath with exertion. Vascular:  [] Pain in legs with walking   [] Pain in legs at rest   [] Pain in legs when laying flat   [] Claudication   [] Pain in feet when walking  [] Pain in feet at rest  [] Pain in feet when laying flat   [] History of DVT   [] Phlebitis   [] Swelling in legs   [] Varicose veins   [] Non-healing ulcers Pulmonary:   [] Uses home oxygen   [] Productive cough   [] Hemoptysis    [] Wheeze  [] COPD   [] Asthma Neurologic:  [] Dizziness  [] Blackouts   [] Seizures   [] History of stroke   [] History of TIA  [] Aphasia   [] Temporary blindness   [] Dysphagia   [] Weakness or numbness in arms   [] Weakness or numbness in legs Musculoskeletal:  [] Arthritis   [] Joint swelling   [] Joint pain   [] Low back pain Hematologic:  [] Easy bruising  [] Easy bleeding   [] Hypercoagulable state   [] Anemic  [] Hepatitis Gastrointestinal:  [] Blood in stool   [] Vomiting blood  [] Gastroesophageal reflux/heartburn   [] Difficulty swallowing. Genitourinary:  [x] Chronic kidney disease   [] Difficult urination  [] Frequent urination  [] Burning with urination   [] Blood in urine Skin:  [] Rashes   [] Ulcers   [] Wounds Psychological:  [] History of anxiety   []  History of major depression.  Physical Examination  Vitals:   03/19/17 1430 03/19/17 1500 03/19/17 1545 03/19/17 1635  BP: (!) 154/69 (!) 141/75 (!) 144/72   Pulse: 89 90 88   Resp:   19   Temp:   98.6 F (37 C)   TempSrc:   Oral   SpO2: 90% 95% 98% 98%  Weight:   124.3 kg (274 lb)   Height:   5\' 6"  (1.676 m)    Body mass index is 44.22 kg/m. Gen: WD/WN, NAD Head: [place]/AT, No temporalis wasting. Prominent temp pulse not noted. Ear/Nose/Throat: Hearing grossly intact, nares w/o erythema or drainage, oropharynx w/o Erythema/Exudate,  Eyes: Conjunctiva clear, sclera non-icteric Neck: Trachea midline.  No JVD.  Pulmonary:  Good air movement, respirations not labored, no use of accessory muscles.  Cardiac: RRR, normal S1, S2. Vascular: Scars bilateral upper extremities consistent with previous nonfunctional dialysis access Vessel Right Left  Radial Palpable Palpable  Ulnar Not Palpable Not Palpable  Brachial Palpable Palpable  Carotid Palpable, without bruit Palpable, without bruit  Gastrointestinal: soft, non-tender/non-distended. No guarding/reflex.  Musculoskeletal: M/S 5/5 throughout.  Extremities without ischemic changes.  No deformity or atrophy.   Neurologic: Sensation grossly intact in extremities.  Symmetrical.  Speech is fluent. Motor exam as listed above. Psychiatric: Judgment intact, Mood & affect appropriate for pt's clinical situation. Dermatologic: No rashes or ulcers noted.  No cellulitis or open wounds. Lymph : No Cervical, Axillary, or Inguinal lymphadenopathy.   CBC Lab Results  Component Value Date   WBC 6.8 03/19/2017   HGB 12.2 03/19/2017   HCT 37.0 03/19/2017   MCV 91.0 03/19/2017   PLT 287 03/19/2017    BMET    Component Value Date/Time   NA 133 (L) 03/19/2017 0956   NA 129 (L) 03/31/2015 0420   K 5.1 03/19/2017 0956   K 4.2 03/31/2015 0420   CL 96 (L) 03/19/2017 0956   CL 88 (L) 03/31/2015 0420   CO2 25  03/19/2017 0956   CO2 28 03/31/2015 0420   GLUCOSE 104 (H) 03/19/2017 0956   GLUCOSE 144 (H) 03/31/2015 0420   BUN 64 (H) 03/19/2017 0956   BUN 61 (H) 03/31/2015 0420   CREATININE 12.05 (H) 03/19/2017 0956   CREATININE 10.20 (H) 03/31/2015 0420   CALCIUM 9.0 03/19/2017 0956   CALCIUM 7.0 (LL) 03/31/2015 0420   GFRNONAA 3 (L) 03/19/2017 0956   GFRNONAA 4 (L) 03/31/2015 0420   GFRAA 3 (L) 03/19/2017 0956   GFRAA 4 (L) 03/31/2015 0420   Estimated Creatinine Clearance: 6.5 mL/min (A) (by C-G formula based on SCr of 12.05 mg/dL (H)).  COAG Lab Results  Component Value Date   INR 1.02 02/25/2017   INR 1.0 06/14/2014   INR 1.0 03/09/2013    Radiology No results found.  Assessment/Plan 1.  Complication dialysis device with thrombosis AV access:  Patient's Right femoral Tunneled catheter has been accidentally removed patidoes not have anyextremity access that is functioning. Therefore, the patient will undergo  placement of the tunneled catheter undeconscious sedationhe risks and benefits were described to the patient.  All questions were answered.  The patient agrees to proceed with angiography and placement of the tunneled catheter.  2.  End-stage renal disease requiring hemodialysis:  Patient  will continue dialysis therapy without further interruption. Dialysis has already been arranged. 3.  Hypertension:  Patient will continue medical management; nephrology is following no changes in oral medications. 4. Diabetes mellitus:  Glucose will be monitored and oral medications been held this morning once the patient has undergone the patient's procedure po intake will be reinitiated and again Accu-Cheks will be used to assess the blood glucose level and treat as needed. The patient will be restarted on the patient's usual hypoglycemic regime    [name], MD  03/19/2017 4:41 PM

## 2017-03-19 NOTE — Telephone Encounter (Signed)
Radiology calling to let us know pt was sent to ED today by another provider  Would like to know if we can reschedule  Please call patient.

## 2017-03-19 NOTE — ED Notes (Signed)
Pt states she noticed her dialysis access in her right thigh area was bleeding and felt like it was "displaced", last tx was Saturday, denies any pain, some brownish dried blood noticed on dressing, pt awake and alert in no acute distress

## 2017-03-19 NOTE — ED Notes (Signed)
Access removed from right leg by Dr. Quentin Cornwall, area cleaned and dressed, pt made aware of NPO status

## 2017-03-20 ENCOUNTER — Encounter: Payer: Self-pay | Admitting: Vascular Surgery

## 2017-03-21 NOTE — Telephone Encounter (Signed)
Spoke with patients spouse regarding rescheduling stress testing. Scheduled 2 day stress test for 03/27/17 & 03/28/17 both days at 10:30AM due to patient having hemodialysis. Reviewed all instructions with patients spouse, agreement with plan, and had no further questions at this time.

## 2017-03-27 ENCOUNTER — Ambulatory Visit
Admission: RE | Admit: 2017-03-27 | Discharge: 2017-03-27 | Disposition: A | Discharge status: Home / Self Care | Payer: Medicare Other | Source: Ambulatory Visit | Attending: Cardiology | Admitting: Cardiology

## 2017-03-27 DIAGNOSIS — R9431 Abnormal electrocardiogram [ECG] [EKG]: Secondary | ICD-10-CM | POA: Diagnosis not present

## 2017-03-27 DIAGNOSIS — R0602 Shortness of breath: Secondary | ICD-10-CM | POA: Diagnosis not present

## 2017-03-27 MED ORDER — TECHNETIUM TC 99M TETROFOSMIN IV KIT
30.3380 | PACK | Freq: Once | INTRAVENOUS | Status: AC | PRN
  Administered 2017-03-27: 30.338 via INTRAVENOUS

## 2017-03-28 ENCOUNTER — Ambulatory Visit
Admission: RE | Admit: 2017-03-28 | Discharge: 2017-03-28 | Disposition: A | Discharge status: Home / Self Care | Payer: Medicare Other | Source: Ambulatory Visit | Attending: Cardiology | Admitting: Cardiology

## 2017-03-28 DIAGNOSIS — R9431 Abnormal electrocardiogram [ECG] [EKG]: Secondary | ICD-10-CM | POA: Diagnosis present

## 2017-03-28 DIAGNOSIS — R0602 Shortness of breath: Secondary | ICD-10-CM | POA: Diagnosis not present

## 2017-03-28 LAB — NM MYOCAR MULTI W/SPECT W/WALL MOTION / EF
LV dias vol: 109 mL (ref 46–106)
LV sys vol: 48 mL
Peak HR: 92 {beats}/min
Percent HR: 58 %
Rest HR: 84 {beats}/min
SDS: 11
SRS: 5
SSS: 12
TID: 0.92

## 2017-03-28 MED ORDER — TECHNETIUM TC 99M TETROFOSMIN IV KIT
29.0370 | PACK | Freq: Once | INTRAVENOUS | Status: AC | PRN
  Administered 2017-03-28: 29.037 via INTRAVENOUS

## 2017-04-02 ENCOUNTER — Encounter: Payer: Self-pay | Admitting: Cardiology

## 2017-04-02 ENCOUNTER — Ambulatory Visit (INDEPENDENT_AMBULATORY_CARE_PROVIDER_SITE_OTHER): Payer: Medicare Other | Admitting: Cardiology

## 2017-04-02 ENCOUNTER — Other Ambulatory Visit: Payer: Self-pay | Admitting: Cardiology

## 2017-04-02 VITALS — BP 122/58 | HR 98 | Ht 66.0 in | Wt 273.5 lb

## 2017-04-02 DIAGNOSIS — R9439 Abnormal result of other cardiovascular function study: Secondary | ICD-10-CM | POA: Diagnosis not present

## 2017-04-02 DIAGNOSIS — E784 Other hyperlipidemia: Secondary | ICD-10-CM | POA: Diagnosis not present

## 2017-04-02 DIAGNOSIS — I10 Essential (primary) hypertension: Secondary | ICD-10-CM

## 2017-04-02 DIAGNOSIS — Z01810 Encounter for preprocedural cardiovascular examination: Secondary | ICD-10-CM

## 2017-04-02 DIAGNOSIS — E7849 Other hyperlipidemia: Secondary | ICD-10-CM

## 2017-04-02 MED ORDER — DIPHENHYDRAMINE HCL 25 MG PO TABS: 50.0000 mg | ORAL_TABLET | ORAL | 0 refills | Status: DC

## 2017-04-02 MED ORDER — PREDNISONE 50 MG PO TABS: ORAL_TABLET | ORAL | 0 refills | Status: DC

## 2017-04-02 NOTE — Progress Notes (Signed)
Cardiology Office Note   Date:  04/02/2017   ID:  [name], DOB [birth year], MRN [id number]  Referring Doctor:  Maryann Conners, MD   Cardiologist:   Wende Bushy, MD   Reason for consultation:  Chief Complaint  Patient presents with  . other    Follow up after testing. Patient denies chest pain and SOB. Meds reviewed verbally with patient.       History of Present Illness: [name] is a 61 y.o. female who presents for Preoperative evaluation prior to AV fistula placement, abnormal EKGHere at she is here for follow-up after testing  Review of records show Patient has a [place] history of shortness of breath which she attributes to building a fluid. This easily goes away once she goes for dialysis. Does not recall any chest pains. Does not recall any history of heart attacks in the past.  Patient continues to have shortness of breath. No chest pain. No syncope.  ROS:  Please see the history of present illness. Aside from mentioned under HPI, all other systems are reviewed and negative.    Past Medical History:  Diagnosis Date  . Anemia   . Anxiety   . Arthritis   . Cancer [facility])    Left Kidney Cancer  . CHF (congestive heart failure) ([place])   . Chronic kidney disease   . COPD (chronic obstructive pulmonary disease) ([facility])    Use Oxygen at bedtime  . Diabetes mellitus without complication ([place])   . Dialysis patient ([place])    Tues, Thurs, Sat  . Dyspnea    with exertion  . GERD (gastroesophageal reflux disease)   . Gout   . History of kidney stones   . Hypertension   . Neuropathy   . Peripheral vascular disease ([place])   . Sleep apnea    OSA--USE C-PAP  . Stroke [facility]) 2004    Past Surgical History:  Procedure Laterality Date  . ABDOMINAL HYSTERECTOMY    . DIALYSIS/PERMA CATHETER INSERTION N/A 01/31/2017   Procedure: Dialysis/Perma Catheter Insertion;  Surgeon: Algernon Huxley, MD;  Location: [place] [facility];  Service: Cardiovascular;   Laterality: N/A;  . DIALYSIS/PERMA CATHETER INSERTION N/A 02/21/2017   Procedure: Dialysis/Perma Catheter Insertion;  Surgeon: Algernon Huxley, MD;  Location: [place] [facility];  Service: Cardiovascular;  Laterality: N/A;  . DIALYSIS/PERMA CATHETER INSERTION N/A 03/19/2017   Procedure: Dialysis/Perma Catheter Insertion;  Surgeon: Katha Cabal, MD;  Location: [place] [facility];  Service: Cardiovascular;  Laterality: N/A;  . EYE SURGERY Bilateral    Cataract Extraction with IOL  . KIDNEY SURGERY Left    Partial Nephrectomy  . PERIPHERAL VASCULAR CATHETERIZATION N/A 01/23/2016   Procedure: Dialysis/Perma Catheter Insertion;  Surgeon: Algernon Huxley, MD;  Location: [place] [facility];  Service: Cardiovascular;  Laterality: N/A;  . PERIPHERAL VASCULAR CATHETERIZATION Left 10/01/2016   Procedure: A/V Shuntogram/Fistulagram;  Surgeon: Algernon Huxley, MD;  Location: [place] [facility];  Service: Cardiovascular;  Laterality: Left;     reports that she has never smoked. She has never used smokeless tobacco. She reports that she does not drink alcohol or use drugs.   family history includes Cancer in her maternal aunt; Diabetes in her father; Heart attack in her father; Heart failure in her mother; Hypertension in her mother; Lung cancer in her maternal aunt.   Outpatient Medications Prior to Visit  Medication Sig Dispense Refill  . acetaminophen (TYLENOL 8 HOUR) 650 MG CR tablet  Take 650 mg by mouth every 8 (eight) hours as needed for pain.    Marland Kitchen albuterol (PROVENTIL HFA;VENTOLIN HFA) 108 (90 Base) MCG/ACT inhaler Inhale 2 puffs into the lungs every 6 (six) hours as needed for wheezing or shortness of breath.    . allopurinol (ZYLOPRIM) 100 MG tablet Take 150 mg by mouth Every Tuesday,Thursday,and Saturday with dialysis.     Marland Kitchen amitriptyline (ELAVIL) 25 MG tablet Take 25 mg by mouth at bedtime.     Marland Kitchen amLODipine (NORVASC) 10 MG tablet Take 10 mg by mouth daily.    Marland Kitchen atorvastatin (LIPITOR) 40 MG tablet  Take 40 mg by mouth daily at 12 noon.    . carboxymethylcellulose (REFRESH PLUS) 0.5 % SOLN Place 1-2 drops into both eyes 3 (three) times daily as needed (for dry eyes).    . citalopram (CELEXA) 40 MG tablet Take 40 mg by mouth daily.     . clopidogrel (PLAVIX) 75 MG tablet Take 75 mg by mouth daily.     . diclofenac sodium (VOLTAREN) 1 % GEL Apply 1 g topically 2 (two) times daily as needed (for pain.).    Marland Kitchen docusate sodium (STOOL SOFTENER) 100 MG capsule Take 200 mg by mouth daily.     . fluticasone (FLONASE) 50 MCG/ACT nasal spray Place 2 sprays into both nostrils daily.     . GNP ALLERGY 25 MG tablet Take 25 mg by mouth at bedtime.   0  . hydroxypropyl methylcellulose / hypromellose (ISOPTO TEARS / GONIOVISC) 2.5 % ophthalmic solution Place 1-2 drops into both eyes 4 (four) times daily as needed for dry eyes.    . insulin glargine (LANTUS) 100 UNIT/ML injection Inject 15 Units into the skin daily.     Marland Kitchen lidocaine-prilocaine (EMLA) cream Apply 1 application topically as needed (prior to dialysis).    Marland Kitchen loratadine (CLARITIN) 10 MG tablet Take 10 mg by mouth at bedtime.    . Misc. Devices [facility]) MISC Frequency:PHARMDIR   Dosage:0.0     Instructions:  Note:Dx: Deconditioning with orthostatic hypotension Dose: 1    . nystatin (MYCOSTATIN/NYSTOP) powder Apply 1 g topically daily as needed (for skin rashes.).     Marland Kitchen omega-3 acid ethyl esters (LOVAZA) 1 g capsule Take 1 g by mouth 2 (two) times daily. NOON & BEDTIME    . OXYGEN Inhale 2 L into the lungs at bedtime.    . polyethylene glycol powder (GLYCOLAX/MIRALAX) powder Take 17 g by mouth daily. With coffee    . predniSONE (DELTASONE) 50 MG tablet Take 50 mg by mouth as directed.  Take 1 tab 13 hours before procedure, 1 TAB 7 HOURS PRIOR, THEN 1 TAB 1 HOUR PRIOR TO PROCEDURE  0  . ranitidine (ZANTAC) 150 MG tablet Take 150 mg by mouth daily.   0  . sevelamer (RENAGEL) 800 MG tablet Take 800 mg by mouth 3 (three) times daily.     Marland Kitchen VICTOZA 18  MG/3ML SOPN Inject 10.8 mg into the skin daily.      No facility-administered medications prior to visit.      Allergies: Iodine; Enalapril; and Iodinated diagnostic agents    PHYSICAL EXAM: VS:  BP (!) 122/58 (BP Location: Right Arm, Patient Position: Sitting, Cuff Size: Normal)   Pulse 98   Ht 5\' 6"  (1.676 m)   Wt 273 lb 8 oz (124.1 kg)   BMI 44.14 kg/m  , Body mass index is 44.14 kg/m. Wt Readings from Last 3 Encounters:  04/02/17 273 lb 8  oz (124.1 kg)  03/19/17 274 lb (124.3 kg)  03/15/17 278 lb 3.2 oz (126.2 kg)  GENERAL:  well developed, well nourished, obese, not in acute distress HEENT: normocephalic, pink conjunctivae, anicteric sclerae, no xanthelasma, normal dentition, oropharynx clear NECK:  no neck vein engorgement, JVP normal, no hepatojugular reflux, carotid upstroke brisk and symmetric, no bruit, no thyromegaly, no lymphadenopathy LUNGS:  good respiratory effort, clear to auscultation bilaterally CV:  PMI not displaced, no thrills, no lifts, S1 and S2 within normal limits, no palpable S3 or S4, no murmurs, no rubs, no gallops ABD:  Soft, nontender, nondistended, normoactive bowel sounds, no abdominal aortic bruit, no hepatomegaly, no splenomegaly MS: nontender back, no kyphosis, no scoliosis, no joint deformities EXT:  2+ DP/PT pulses, no edema, no varicosities, no cyanosis, no clubbing SKIN: warm, nondiaphoretic, normal turgor, no ulcers NEUROPSYCH: alert, oriented to person, place, and time, sensory/motor grossly intact, normal mood, appropriate affect    Recent Labs: 03/19/2017: BUN 64; Creatinine, Ser 12.05; Hemoglobin 12.2; Platelets 287; Potassium 5.1; Sodium 133   Lipid Panel    Component Value Date/Time   CHOL 154 03/24/2015 0531   TRIG 155 (H) 03/24/2015 0531   HDL 33 (L) 03/24/2015 0531   VLDL 31 03/24/2015 0531   LDLCALC 90 03/24/2015 0531     Other studies Reviewed:  EKG:  The ekg from 03/13/2017 was personally reviewed by me and it  revealed sinus rhythm, 92 BPM, poor R-wave progression versus Q waves in V1 to V4, cannot rule out anterior infarct.  Additional studies/ records that were reviewed personally reviewed by me today include:  Echo 03/15/2017: Left ventricle: The cavity size was normal. Systolic function was   normal. The estimated ejection fraction was in the range of 50%   to 55%. Regional wall motion abnormalities cannot be excluded.   Doppler parameters are consistent with abnormal left ventricular   relaxation (grade 1 diastolic dysfunction). - Left atrium: The atrium was normal in size. - Right ventricle: Systolic function was normal. - Pulmonary arteries: Systolic pressure could not be accurately   estimated.  Impressions:  - Challenging image quality.  Nuclear stress test 03/28/2017:  T wave inversion was noted during stress in the aVL leads.  There was no ST segment deviation noted during stress.  Defect 1: There is a large defect of severe severity present in the basal anterior, basal anteroseptal, mid anterior, mid anteroseptal and apical anterior location.  Findings consistent with LAD ischemia.  This is a high risk study.  The left ventricular ejection fraction is mildly decreased (45-54%).  Suboptimal study due to breast attenuation. Can not rule out shifting breast attenuation although the defect persisted with attenuation correction.   ASSESSMENT AND PLAN:  Shortness of breath Abnormal EKG Risk factors for CAD include age, hypertension, hyperlipidemia, diabetes Possible ischemia noted on stress test. Recommend proceed with further evaluation with left heart catheterization. Risks and benefits of cardiac catheterization have been discussed with the patient.  These include less than 1% chance of major complications including but not limited to bleeding, infection, kidney damage, arrhyhthmia, heart attack, stroke, and death.  The patient understands these risks and is willing to  proceed. Patient has previous left arm AV fistula. She also has a PermCath on left groin. Will premedicate for hx of allergy to iodinated contrast (prednisone and benadryl)  Hypertension BP is well controlled. Continue monitoring BP. Continue current medical therapy and lifestyle changes.   Hyperlipidemia PCP following labs. LDL goal is less than 70  due to diabetes. Continue statin therapy.   Current medicines are reviewed at length with the patient today.  The patient does not have concerns regarding medicines.  Labs/ tests ordered today include:  No orders of the defined types were placed in this encounter.   Disposition:   FU with Cardiology after tests     Signed, Wende Bushy, MD  04/02/2017 3:22 PM    [place]  This note was generated in part with voice recognition software and I apologize for any typographical errors that were not detected and corrected.

## 2017-04-02 NOTE — Patient Instructions (Addendum)
Medication Instructions:  Your physician has recommended you take the following prior to your procedure. 1. Take Prednisone 50 mg at 6 pm Thursday night 04/04/17 2. Take Prednisone 50 mg at bedtime Thursday night 04/04/17 3. Take Prednisone 50 mg the morning of your procedure Friday 04/05/17 4. Take Benadryl 50 mg the morning of your procedure Friday 04/05/17  Labwork: Please go to [place] Entrance Thursday after dialysis to have labs checked. Check in at the front desk and take lab slips.   Testing/Procedures: [facility] Cardiac Cath Instructions   You are scheduled for a Cardiac Cath on:_Friday May 4, 2018_  Please arrive at _07:30_am on the day of your procedure  Please expect a call from our [place] to pre-register you  Do not eat/drink anything after midnight  Someone will need to drive you home  It is recommended someone be with you for the first 24 hours after your procedure  Wear clothes that are easy to get on/off and wear slip on shoes if possible   Medications bring a current list of all medications with you  _X__ You may take some of your medications the morning of your procedure with enough water to swallow safely  _X__ Do not any diabetic medications the morning of your test and take only 1/2 dose of your insulin the night before.    Day of your procedure: Arrive at the [facility] entrance.  Free valet service is available.  After entering the [facility] please check-in at the registration desk (1st desk on your right) to receive your armband. After receiving your armband someone will escort you to the cardiac cath/special procedures waiting area.  The usual length of stay after your procedure is about 2 to 3 hours.  This can vary.  If you have any questions, please call our office at [phone], or you may call the cardiac cath lab at [facility] directly at [phone]   Follow-Up: Your physician recommends that you schedule a follow-up  appointment in: 2 weeks after heart cath.  It was a pleasure seeing you today here in the office. Please do not hesitate to give [place] a call back if you have any further questions. [place], BSN

## 2017-04-04 ENCOUNTER — Other Ambulatory Visit
Admission: RE | Admit: 2017-04-04 | Discharge: 2017-04-04 | Disposition: A | Discharge status: Home / Self Care | Payer: Medicare Other | Source: Ambulatory Visit | Attending: Cardiology | Admitting: Cardiology

## 2017-04-04 ENCOUNTER — Telehealth: Payer: Self-pay | Admitting: Cardiology

## 2017-04-04 DIAGNOSIS — Z01810 Encounter for preprocedural cardiovascular examination: Secondary | ICD-10-CM

## 2017-04-04 LAB — COMPREHENSIVE METABOLIC PANEL
ALT: 18 U/L (ref 14–54)
AST: 14 U/L — ABNORMAL LOW (ref 15–41)
Albumin: 3.4 g/dL — ABNORMAL LOW (ref 3.5–5.0)
Alkaline Phosphatase: 119 U/L (ref 38–126)
Anion gap: 11 (ref 5–15)
BUN: 23 mg/dL — ABNORMAL HIGH (ref 6–20)
CO2: 28 mmol/L (ref 22–32)
Calcium: 9.5 mg/dL (ref 8.9–10.3)
Chloride: 96 mmol/L — ABNORMAL LOW (ref 101–111)
Creatinine, Ser: 6.89 mg/dL — ABNORMAL HIGH (ref 0.44–1.00)
GFR calc Af Amer: 7 mL/min — ABNORMAL LOW (ref >60)
GFR calc non Af Amer: 6 mL/min — ABNORMAL LOW (ref >60)
Glucose, Bld: 138 mg/dL — ABNORMAL HIGH (ref 65–99)
Potassium: 4.1 mmol/L (ref 3.5–5.1)
Sodium: 135 mmol/L (ref 135–145)
Total Bilirubin: 0.5 mg/dL (ref 0.3–1.2)
Total Protein: 8.3 g/dL — ABNORMAL HIGH (ref 6.5–8.1)

## 2017-04-04 LAB — CBC WITH DIFFERENTIAL/PLATELET
Basophils Absolute: 0.1 10*3/uL (ref 0–0.1)
Basophils Relative: 2 %
Eosinophils Absolute: 0.1 10*3/uL (ref 0–0.7)
Eosinophils Relative: 2 %
HCT: 37.7 % (ref 35.0–47.0)
Hemoglobin: 12.3 g/dL (ref 12.0–16.0)
Lymphocytes Relative: 28 %
Lymphs Abs: 2 10*3/uL (ref 1.0–3.6)
MCH: 29.8 pg (ref 26.0–34.0)
MCHC: 32.7 g/dL (ref 32.0–36.0)
MCV: 91.1 fL (ref 80.0–100.0)
Monocytes Absolute: 0.6 10*3/uL (ref 0.2–0.9)
Monocytes Relative: 9 %
Neutro Abs: 4.2 10*3/uL (ref 1.4–6.5)
Neutrophils Relative %: 59 %
Platelets: 275 10*3/uL (ref 150–440)
RBC: 4.14 MIL/uL (ref 3.80–5.20)
RDW: 15.8 % — ABNORMAL HIGH (ref 11.5–14.5)
WBC: 7.1 10*3/uL (ref 3.6–11.0)

## 2017-04-04 LAB — PROTIME-INR
INR: 1.01
Prothrombin Time: 13.3 seconds (ref 11.4–15.2)

## 2017-04-04 NOTE — Telephone Encounter (Signed)
Left detailed voicemail message with procedure instructions, time to arrive, labs, and medications to take with instructions to call back if any further questions.

## 2017-04-05 ENCOUNTER — Ambulatory Visit
Admission: RE | Admit: 2017-04-05 | Discharge: 2017-04-05 | Disposition: A | Discharge status: Home / Self Care | Payer: Medicare Other | Source: Ambulatory Visit | Attending: Cardiovascular Disease | Admitting: Cardiovascular Disease

## 2017-04-05 ENCOUNTER — Encounter
Admission: RE | Disposition: A | Discharge status: Home / Self Care | Payer: Self-pay | Source: Ambulatory Visit | Attending: Cardiovascular Disease

## 2017-04-05 DIAGNOSIS — I2510 Atherosclerotic heart disease of native coronary artery without angina pectoris: Secondary | ICD-10-CM | POA: Insufficient documentation

## 2017-04-05 DIAGNOSIS — Z794 Long term (current) use of insulin: Secondary | ICD-10-CM | POA: Insufficient documentation

## 2017-04-05 DIAGNOSIS — E1151 Type 2 diabetes mellitus with diabetic peripheral angiopathy without gangrene: Secondary | ICD-10-CM | POA: Diagnosis not present

## 2017-04-05 DIAGNOSIS — G4733 Obstructive sleep apnea (adult) (pediatric): Secondary | ICD-10-CM | POA: Diagnosis not present

## 2017-04-05 DIAGNOSIS — J449 Chronic obstructive pulmonary disease, unspecified: Secondary | ICD-10-CM | POA: Diagnosis not present

## 2017-04-05 DIAGNOSIS — Z8673 Personal history of transient ischemic attack (TIA), and cerebral infarction without residual deficits: Secondary | ICD-10-CM | POA: Insufficient documentation

## 2017-04-05 DIAGNOSIS — Z85528 Personal history of other malignant neoplasm of kidney: Secondary | ICD-10-CM | POA: Insufficient documentation

## 2017-04-05 DIAGNOSIS — E785 Hyperlipidemia, unspecified: Secondary | ICD-10-CM | POA: Diagnosis not present

## 2017-04-05 DIAGNOSIS — F419 Anxiety disorder, unspecified: Secondary | ICD-10-CM | POA: Insufficient documentation

## 2017-04-05 DIAGNOSIS — Z992 Dependence on renal dialysis: Secondary | ICD-10-CM | POA: Diagnosis not present

## 2017-04-05 DIAGNOSIS — E1140 Type 2 diabetes mellitus with diabetic neuropathy, unspecified: Secondary | ICD-10-CM | POA: Insufficient documentation

## 2017-04-05 DIAGNOSIS — I509 Heart failure, unspecified: Secondary | ICD-10-CM | POA: Diagnosis not present

## 2017-04-05 DIAGNOSIS — M109 Gout, unspecified: Secondary | ICD-10-CM | POA: Insufficient documentation

## 2017-04-05 DIAGNOSIS — R9439 Abnormal result of other cardiovascular function study: Secondary | ICD-10-CM

## 2017-04-05 DIAGNOSIS — I130 Hypertensive heart and chronic kidney disease with heart failure and stage 1 through stage 4 chronic kidney disease, or unspecified chronic kidney disease: Secondary | ICD-10-CM | POA: Diagnosis not present

## 2017-04-05 DIAGNOSIS — Z7952 Long term (current) use of systemic steroids: Secondary | ICD-10-CM | POA: Diagnosis not present

## 2017-04-05 DIAGNOSIS — D631 Anemia in chronic kidney disease: Secondary | ICD-10-CM | POA: Diagnosis not present

## 2017-04-05 DIAGNOSIS — Z8249 Family history of ischemic heart disease and other diseases of the circulatory system: Secondary | ICD-10-CM | POA: Diagnosis not present

## 2017-04-05 DIAGNOSIS — Z7902 Long term (current) use of antithrombotics/antiplatelets: Secondary | ICD-10-CM | POA: Diagnosis not present

## 2017-04-05 DIAGNOSIS — K219 Gastro-esophageal reflux disease without esophagitis: Secondary | ICD-10-CM | POA: Insufficient documentation

## 2017-04-05 DIAGNOSIS — Z888 Allergy status to other drugs, medicaments and biological substances status: Secondary | ICD-10-CM | POA: Insufficient documentation

## 2017-04-05 DIAGNOSIS — N189 Chronic kidney disease, unspecified: Secondary | ICD-10-CM | POA: Insufficient documentation

## 2017-04-05 DIAGNOSIS — M1990 Unspecified osteoarthritis, unspecified site: Secondary | ICD-10-CM | POA: Insufficient documentation

## 2017-04-05 DIAGNOSIS — E1122 Type 2 diabetes mellitus with diabetic chronic kidney disease: Secondary | ICD-10-CM | POA: Diagnosis not present

## 2017-04-05 HISTORY — PX: LEFT HEART CATH AND CORONARY ANGIOGRAPHY: CATH118249

## 2017-04-05 LAB — GLUCOSE, CAPILLARY: Glucose-Capillary: 244 mg/dL — ABNORMAL HIGH (ref 65–99)

## 2017-04-05 SURGERY — LEFT HEART CATH AND CORONARY ANGIOGRAPHY
Anesthesia: Moderate Sedation

## 2017-04-05 MED ORDER — SODIUM CHLORIDE 0.9 % IV SOLN: INTRAVENOUS | Status: DC

## 2017-04-05 MED ORDER — MIDAZOLAM HCL 2 MG/2ML IJ SOLN
INTRAMUSCULAR | Status: DC | PRN
  Administered 2017-04-05: 0.5 mg via INTRAVENOUS
  Administered 2017-04-05: 1 mg via INTRAVENOUS

## 2017-04-05 MED ORDER — METHYLPREDNISOLONE SODIUM SUCC 125 MG IJ SOLR
INTRAMUSCULAR | Status: DC | PRN
  Administered 2017-04-05: 125 mg via INTRAVENOUS

## 2017-04-05 MED ORDER — SODIUM CHLORIDE 0.9 % IV SOLN: 250.0000 mL | INTRAVENOUS | Status: DC | PRN

## 2017-04-05 MED ORDER — MIDAZOLAM HCL 2 MG/2ML IJ SOLN
INTRAMUSCULAR | Status: AC
  Filled 2017-04-05: qty 2

## 2017-04-05 MED ORDER — SODIUM CHLORIDE 0.9% FLUSH: 3.0000 mL | Freq: Two times a day (BID) | INTRAVENOUS | Status: DC

## 2017-04-05 MED ORDER — METHYLPREDNISOLONE SODIUM SUCC 125 MG IJ SOLR
INTRAMUSCULAR | Status: AC
  Filled 2017-04-05: qty 2

## 2017-04-05 MED ORDER — HEPARIN (PORCINE) IN NACL 2-0.9 UNIT/ML-% IJ SOLN
INTRAMUSCULAR | Status: AC
  Filled 2017-04-05: qty 1000

## 2017-04-05 MED ORDER — FENTANYL CITRATE (PF) 100 MCG/2ML IJ SOLN
INTRAMUSCULAR | Status: DC | PRN
  Administered 2017-04-05: 25 ug via INTRAVENOUS
  Administered 2017-04-05: 50 ug via INTRAVENOUS

## 2017-04-05 MED ORDER — SODIUM CHLORIDE 0.9% FLUSH: 3.0000 mL | INTRAVENOUS | Status: DC | PRN

## 2017-04-05 MED ORDER — IOPAMIDOL (ISOVUE-300) INJECTION 61%
INTRAVENOUS | Status: DC | PRN
  Administered 2017-04-05: 50 mL via INTRA_ARTERIAL

## 2017-04-05 MED ORDER — FENTANYL CITRATE (PF) 100 MCG/2ML IJ SOLN
INTRAMUSCULAR | Status: AC
  Filled 2017-04-05: qty 2

## 2017-04-05 SURGICAL SUPPLY — 12 items
CATH INFINITI 5FR ANG PIGTAIL (CATHETERS) ×2 IMPLANT
CATH INFINITI 5FR JL4 (CATHETERS) ×2 IMPLANT
CATH INFINITI JR4 5F (CATHETERS) ×2 IMPLANT
DEVICE CLOSURE MYNXGRIP 5F (Vascular Products) ×2 IMPLANT
GLIDESHEATH SLEND SS 6F .021 (SHEATH) ×2 IMPLANT
GUIDEWIRE 3MM J TIP .035 145 (WIRE) ×2 IMPLANT
KIT MANI 3VAL PERCEP (MISCELLANEOUS) ×2 IMPLANT
NEEDLE PERC 18GX7CM (NEEDLE) ×2 IMPLANT
PACK CARDIAC CATH (CUSTOM PROCEDURE TRAY) ×2 IMPLANT
SHEATH AVANTI 5FR X 11CM (SHEATH) ×4 IMPLANT
SHEATH BRITE TIP 4FRX11 (SHEATH) ×2 IMPLANT
WIRE ROSEN-J .035X260CM (WIRE) ×2 IMPLANT

## 2017-04-05 NOTE — H&P (View-Only) (Signed)
Cardiology Office Note   Date:  04/02/2017   ID:  [name], DOB March 08, 1956, MRN [id number]  Referring Doctor:  Maryann Conners, MD   Cardiologist:   Wende Bushy, MD   Reason for consultation:  Chief Complaint  Patient presents with  . other    Follow up after testing. Patient denies chest pain and SOB. Meds reviewed verbally with patient.       History of Present Illness: [name] is a 61 y.o. female who presents for Preoperative evaluation prior to AV fistula placement, abnormal EKGHere at she is here for follow-up after testing  Review of records show Patient has a Cherry history of shortness of breath which she attributes to building a fluid. This easily goes away once she goes for dialysis. Does not recall any chest pains. Does not recall any history of heart attacks in the past.  Patient continues to have shortness of breath. No chest pain. No syncope.  ROS:  Please see the history of present illness. Aside from mentioned under HPI, all other systems are reviewed and negative.    Past Medical History:  Diagnosis Date  . Anemia   . Anxiety   . Arthritis   . Cancer [facility])    Left Kidney Cancer  . CHF (congestive heart failure) ([place])   . Chronic kidney disease   . COPD (chronic obstructive pulmonary disease) ([facility])    Use Oxygen at bedtime  . Diabetes mellitus without complication ([place])   . Dialysis patient ([place])    Tues, Thurs, Sat  . Dyspnea    with exertion  . GERD (gastroesophageal reflux disease)   . Gout   . History of kidney stones   . Hypertension   . Neuropathy   . Peripheral vascular disease ([place])   . Sleep apnea    OSA--USE C-PAP  . Stroke [facility]) 2004    Past Surgical History:  Procedure Laterality Date  . ABDOMINAL HYSTERECTOMY    . DIALYSIS/PERMA CATHETER INSERTION N/A 01/31/2017   Procedure: Dialysis/Perma Catheter Insertion;  Surgeon: Algernon Huxley, MD;  Location: [place] [facility];  Service: Cardiovascular;   Laterality: N/A;  . DIALYSIS/PERMA CATHETER INSERTION N/A 02/21/2017   Procedure: Dialysis/Perma Catheter Insertion;  Surgeon: Algernon Huxley, MD;  Location: [place] [facility];  Service: Cardiovascular;  Laterality: N/A;  . DIALYSIS/PERMA CATHETER INSERTION N/A 03/19/2017   Procedure: Dialysis/Perma Catheter Insertion;  Surgeon: Katha Cabal, MD;  Location: [place] [facility];  Service: Cardiovascular;  Laterality: N/A;  . EYE SURGERY Bilateral    Cataract Extraction with IOL  . KIDNEY SURGERY Left    Partial Nephrectomy  . PERIPHERAL VASCULAR CATHETERIZATION N/A 01/23/2016   Procedure: Dialysis/Perma Catheter Insertion;  Surgeon: Algernon Huxley, MD;  Location: [place] [facility];  Service: Cardiovascular;  Laterality: N/A;  . PERIPHERAL VASCULAR CATHETERIZATION Left 10/01/2016   Procedure: A/V Shuntogram/Fistulagram;  Surgeon: Algernon Huxley, MD;  Location: [place] [facility];  Service: Cardiovascular;  Laterality: Left;     reports that she has never smoked. She has never used smokeless tobacco. She reports that she does not drink alcohol or use drugs.   family history includes Cancer in her maternal aunt; Diabetes in her father; Heart attack in her father; Heart failure in her mother; Hypertension in her mother; Lung cancer in her maternal aunt.   Outpatient Medications Prior to Visit  Medication Sig Dispense Refill  . acetaminophen (TYLENOL 8 HOUR) 650 MG CR tablet  Take 650 mg by mouth every 8 (eight) hours as needed for pain.    Marland Kitchen albuterol (PROVENTIL HFA;VENTOLIN HFA) 108 (90 Base) MCG/ACT inhaler Inhale 2 puffs into the lungs every 6 (six) hours as needed for wheezing or shortness of breath.    . allopurinol (ZYLOPRIM) 100 MG tablet Take 150 mg by mouth Every Tuesday,Thursday,and Saturday with dialysis.     Marland Kitchen amitriptyline (ELAVIL) 25 MG tablet Take 25 mg by mouth at bedtime.     Marland Kitchen amLODipine (NORVASC) 10 MG tablet Take 10 mg by mouth daily.    Marland Kitchen atorvastatin (LIPITOR) 40 MG tablet  Take 40 mg by mouth daily at 12 noon.    . carboxymethylcellulose (REFRESH PLUS) 0.5 % SOLN Place 1-2 drops into both eyes 3 (three) times daily as needed (for dry eyes).    . citalopram (CELEXA) 40 MG tablet Take 40 mg by mouth daily.     . clopidogrel (PLAVIX) 75 MG tablet Take 75 mg by mouth daily.     . diclofenac sodium (VOLTAREN) 1 % GEL Apply 1 g topically 2 (two) times daily as needed (for pain.).    Marland Kitchen docusate sodium (STOOL SOFTENER) 100 MG capsule Take 200 mg by mouth daily.     . fluticasone (FLONASE) 50 MCG/ACT nasal spray Place 2 sprays into both nostrils daily.     . GNP ALLERGY 25 MG tablet Take 25 mg by mouth at bedtime.   0  . hydroxypropyl methylcellulose / hypromellose (ISOPTO TEARS / GONIOVISC) 2.5 % ophthalmic solution Place 1-2 drops into both eyes 4 (four) times daily as needed for dry eyes.    . insulin glargine (LANTUS) 100 UNIT/ML injection Inject 15 Units into the skin daily.     Marland Kitchen lidocaine-prilocaine (EMLA) cream Apply 1 application topically as needed (prior to dialysis).    Marland Kitchen loratadine (CLARITIN) 10 MG tablet Take 10 mg by mouth at bedtime.    . Misc. Devices [facility]) MISC Frequency:PHARMDIR   Dosage:0.0     Instructions:  Note:Dx: Deconditioning with orthostatic hypotension Dose: 1    . nystatin (MYCOSTATIN/NYSTOP) powder Apply 1 g topically daily as needed (for skin rashes.).     Marland Kitchen omega-3 acid ethyl esters (LOVAZA) 1 g capsule Take 1 g by mouth 2 (two) times daily. NOON & BEDTIME    . OXYGEN Inhale 2 L into the lungs at bedtime.    . polyethylene glycol powder (GLYCOLAX/MIRALAX) powder Take 17 g by mouth daily. With coffee    . predniSONE (DELTASONE) 50 MG tablet Take 50 mg by mouth as directed.  Take 1 tab 13 hours before procedure, 1 TAB 7 HOURS PRIOR, THEN 1 TAB 1 HOUR PRIOR TO PROCEDURE  0  . ranitidine (ZANTAC) 150 MG tablet Take 150 mg by mouth daily.   0  . sevelamer (RENAGEL) 800 MG tablet Take 800 mg by mouth 3 (three) times daily.     Marland Kitchen VICTOZA 18  MG/3ML SOPN Inject 10.8 mg into the skin daily.      No facility-administered medications prior to visit.      Allergies: Iodine; Enalapril; and Iodinated diagnostic agents    PHYSICAL EXAM: VS:  BP (!) 122/58 (BP Location: Right Arm, Patient Position: Sitting, Cuff Size: Normal)   Pulse 98   Ht 5\' 6"  (1.676 m)   Wt 273 lb 8 oz (124.1 kg)   BMI 44.14 kg/m  , Body mass index is 44.14 kg/m. Wt Readings from Last 3 Encounters:  04/02/17 273 lb 8  oz (124.1 kg)  03/19/17 274 lb (124.3 kg)  03/15/17 278 lb 3.2 oz (126.2 kg)  GENERAL:  well developed, well nourished, obese, not in acute distress HEENT: normocephalic, pink conjunctivae, anicteric sclerae, no xanthelasma, normal dentition, oropharynx clear NECK:  no neck vein engorgement, JVP normal, no hepatojugular reflux, carotid upstroke brisk and symmetric, no bruit, no thyromegaly, no lymphadenopathy LUNGS:  good respiratory effort, clear to auscultation bilaterally CV:  PMI not displaced, no thrills, no lifts, S1 and S2 within normal limits, no palpable S3 or S4, no murmurs, no rubs, no gallops ABD:  Soft, nontender, nondistended, normoactive bowel sounds, no abdominal aortic bruit, no hepatomegaly, no splenomegaly MS: nontender back, no kyphosis, no scoliosis, no joint deformities EXT:  2+ DP/PT pulses, no edema, no varicosities, no cyanosis, no clubbing SKIN: warm, nondiaphoretic, normal turgor, no ulcers NEUROPSYCH: alert, oriented to person, place, and time, sensory/motor grossly intact, normal mood, appropriate affect    Recent Labs: 03/19/2017: BUN 64; Creatinine, Ser 12.05; Hemoglobin 12.2; Platelets 287; Potassium 5.1; Sodium 133   Lipid Panel    Component Value Date/Time   CHOL 154 03/24/2015 0531   TRIG 155 (H) 03/24/2015 0531   HDL 33 (L) 03/24/2015 0531   VLDL 31 03/24/2015 0531   LDLCALC 90 03/24/2015 0531     Other studies Reviewed:  EKG:  The ekg from 03/13/2017 was personally reviewed by me and it  revealed sinus rhythm, 92 BPM, poor R-wave progression versus Q waves in V1 to V4, cannot rule out anterior infarct.  Additional studies/ records that were reviewed personally reviewed by me today include:  Echo 03/15/2017: Left ventricle: The cavity size was normal. Systolic function was   normal. The estimated ejection fraction was in the range of 50%   to 55%. Regional wall motion abnormalities cannot be excluded.   Doppler parameters are consistent with abnormal left ventricular   relaxation (grade 1 diastolic dysfunction). - Left atrium: The atrium was normal in size. - Right ventricle: Systolic function was normal. - Pulmonary arteries: Systolic pressure could not be accurately   estimated.  Impressions:  - Challenging image quality.  Nuclear stress test 03/28/2017:  T wave inversion was noted during stress in the aVL leads.  There was no ST segment deviation noted during stress.  Defect 1: There is a large defect of severe severity present in the basal anterior, basal anteroseptal, mid anterior, mid anteroseptal and apical anterior location.  Findings consistent with LAD ischemia.  This is a high risk study.  The left ventricular ejection fraction is mildly decreased (45-54%).  Suboptimal study due to breast attenuation. Can not rule out shifting breast attenuation although the defect persisted with attenuation correction.   ASSESSMENT AND PLAN:  Shortness of breath Abnormal EKG Risk factors for CAD include age, hypertension, hyperlipidemia, diabetes Possible ischemia noted on stress test. Recommend proceed with further evaluation with left heart catheterization. Risks and benefits of cardiac catheterization have been discussed with the patient.  These include less than 1% chance of major complications including but not limited to bleeding, infection, kidney damage, arrhyhthmia, heart attack, stroke, and death.  The patient understands these risks and is willing to  proceed. Patient has previous left arm AV fistula. She also has a PermCath on left groin. Will premedicate for hx of allergy to iodinated contrast (prednisone and benadryl)  Hypertension BP is well controlled. Continue monitoring BP. Continue current medical therapy and lifestyle changes.   Hyperlipidemia PCP following labs. LDL goal is less than 70  due to diabetes. Continue statin therapy.   Current medicines are reviewed at length with the patient today.  The patient does not have concerns regarding medicines.  Labs/ tests ordered today include:  No orders of the defined types were placed in this encounter.   Disposition:   FU with Cardiology after tests     Signed, Wende Bushy, MD  04/02/2017 3:22 PM    [place]  This note was generated in part with voice recognition software and I apologize for any typographical errors that were not detected and corrected.

## 2017-04-05 NOTE — Progress Notes (Signed)
Patient stated that she completed her preprocedure medications for iodine allergy. MD notified that patient completed medications, no attestation, and vascular history. No new orders at this time.

## 2017-04-05 NOTE — Interval H&P Note (Signed)
History and Physical Interval Note:  04/05/2017 9:18 AM  [name]  has presented today for surgery, with the diagnosis of abnormal stress test  The various methods of treatment have been discussed with the patient and family. After consideration of risks, benefits and other options for treatment, the patient has consented to  Procedure(s): Left Heart Cath and Coronary Angiography (N/A) as a surgical intervention .  The patient's history has been reviewed, patient examined, no change in status, stable for surgery.  I have reviewed the patient's chart and labs.  Questions were answered to the patient's satisfaction.     Kathlyn [place]

## 2017-04-15 ENCOUNTER — Ambulatory Visit (INDEPENDENT_AMBULATORY_CARE_PROVIDER_SITE_OTHER): Payer: Medicare Other | Admitting: Podiatry

## 2017-04-15 DIAGNOSIS — E1142 Type 2 diabetes mellitus with diabetic polyneuropathy: Secondary | ICD-10-CM

## 2017-04-15 DIAGNOSIS — B351 Tinea unguium: Secondary | ICD-10-CM

## 2017-04-15 DIAGNOSIS — M79676 Pain in unspecified toe(s): Secondary | ICD-10-CM

## 2017-04-15 NOTE — Progress Notes (Signed)
Complaint:  Visit Type: Patient returns to my office for continued preventative foot care services. Complaint: Patient states" my nails have grown Jeschke and thick and become painful to walk and wear shoes".  Patient has also developed a painful area on her left big toe following previous nail surgery. Patient has been diagnosed with DM with no foot complications. The patient presents for preventative foot care services. No changes to ROS  Podiatric Exam: Vascular: dorsalis pedis and posterior tibial pulses are palpable bilateral. Capillary return is immediate. Temperature gradient is WNL. Skin turgor WNL  Sensorium: Normal Semmes Weinstein monofilament test. Normal tactile sensation bilaterally. Nail Exam: Pt has thick disfigured discolored nails with subungual debris noted bilateral entire nail hallux through fifth toenails except left hallux..  Skin lesion has developed on left nail bed with no infection noted. Ulcer Exam: There is no evidence of ulcer or pre-ulcerative changes or infection. Orthopedic Exam: Muscle tone and strength are WNL. No limitations in general ROM. No crepitus or effusions noted. Foot type and digits show no abnormalities. Bony prominences are unremarkable. Skin: No Porokeratosis. No infection or ulcers  Diagnosis:  Onychomycosis, , Pain in right toe, pain in left toes  Treatment & Plan Procedures and Treatment: Consent by patient was obtained for treatment procedures. The patient understood the discussion of treatment and procedures well. All questions were answered thoroughly reviewed. Debridement of mycotic and hypertrophic toenails, 1 through 5 bilateral and clearing of subungual debris. No ulceration, no infection noted.  To consider diabetic shoes next visit. Return Visit-Office Procedure: Patient instructed to return to the office for a follow up visit 3 months for continued evaluation and treatment.    Gardiner Barefoot DPM

## 2017-04-17 ENCOUNTER — Ambulatory Visit (INDEPENDENT_AMBULATORY_CARE_PROVIDER_SITE_OTHER): Payer: Medicare Other | Admitting: Cardiology

## 2017-04-17 ENCOUNTER — Encounter: Payer: Self-pay | Admitting: Cardiology

## 2017-04-17 VITALS — BP 138/58 | HR 93 | Ht 66.0 in | Wt 283.0 lb

## 2017-04-17 DIAGNOSIS — I2510 Atherosclerotic heart disease of native coronary artery without angina pectoris: Secondary | ICD-10-CM | POA: Diagnosis not present

## 2017-04-17 DIAGNOSIS — E7849 Other hyperlipidemia: Secondary | ICD-10-CM

## 2017-04-17 DIAGNOSIS — I10 Essential (primary) hypertension: Secondary | ICD-10-CM | POA: Diagnosis not present

## 2017-04-17 DIAGNOSIS — E784 Other hyperlipidemia: Secondary | ICD-10-CM

## 2017-04-17 DIAGNOSIS — Z01810 Encounter for preprocedural cardiovascular examination: Secondary | ICD-10-CM

## 2017-04-17 NOTE — Progress Notes (Signed)
Cardiology Office Note   Date:  04/17/2017   ID:  [name], DOB 20-Mar-1956, MRN [id number]  Referring Doctor:  Carolyn Conners, MD   Cardiologist:   Carolyn Bushy, MD   Reason for consultation:  Chief Complaint  Patient presents with  . other    2 week follow up from heart cath. Patient denies chest pain. Meds reviewed verbally with patient.       History of Present Illness: [name] is a 61 y.o. female who presents for ffup after heart cath  Preoperative evaluation prior to AV fistula placement, abnormal EKG  Review of records show Patient has a Mitch history of shortness of breath which she attributes to building a fluid. This easily goes away once she goes for dialysis. Does not recall any chest pains. Does not recall any history of heart attacks in the past.  Patient tolerated left heart cath procedure okay. No complications. No chest pains.  ROS:  Please see the history of present illness. Aside from mentioned under HPI, all other systems are reviewed and negative.    Past Medical History:  Diagnosis Date  . Anemia   . Anxiety   . Arthritis   . Cancer [facility])    Left Kidney Cancer  . CHF (congestive heart failure) ([place])   . Chronic kidney disease   . COPD (chronic obstructive pulmonary disease) (HCC)    Use Oxygen at bedtime  . Diabetes mellitus without complication ([place])   . Dialysis patient ([place])    Tues, Thurs, Sat  . Dyspnea    with exertion  . GERD (gastroesophageal reflux disease)   . Gout   . History of kidney stones   . Hypertension   . Neuropathy   . Peripheral vascular disease ([place])   . Sleep apnea    OSA--USE C-PAP  . Stroke [facility]) 2004    Past Surgical History:  Procedure Laterality Date  . ABDOMINAL HYSTERECTOMY    . DIALYSIS/PERMA CATHETER INSERTION N/A 01/31/2017   Procedure: Dialysis/Perma Catheter Insertion;  Surgeon: Algernon Huxley, MD;  Location: [place] [facility];  Service: Cardiovascular;  Laterality: N/A;  .  DIALYSIS/PERMA CATHETER INSERTION N/A 02/21/2017   Procedure: Dialysis/Perma Catheter Insertion;  Surgeon: Algernon Huxley, MD;  Location: [place] [facility];  Service: Cardiovascular;  Laterality: N/A;  . DIALYSIS/PERMA CATHETER INSERTION N/A 03/19/2017   Procedure: Dialysis/Perma Catheter Insertion;  Surgeon: Carolyn Cabal, MD;  Location: [place] [facility];  Service: Cardiovascular;  Laterality: N/A;  . EYE SURGERY Bilateral    Cataract Extraction with IOL  . KIDNEY SURGERY Left    Partial Nephrectomy  . LEFT HEART CATH AND CORONARY ANGIOGRAPHY N/A 04/05/2017   Procedure: Left Heart Cath and Coronary Angiography;  Surgeon: Carolyn Hampshire, MD;  Location: [place] [facility];  Service: Cardiovascular;  Laterality: N/A;  . PERIPHERAL VASCULAR CATHETERIZATION N/A 01/23/2016   Procedure: Dialysis/Perma Catheter Insertion;  Surgeon: Algernon Huxley, MD;  Location: [place] [facility];  Service: Cardiovascular;  Laterality: N/A;  . PERIPHERAL VASCULAR CATHETERIZATION Left 10/01/2016   Procedure: A/V Shuntogram/Fistulagram;  Surgeon: Algernon Huxley, MD;  Location: [place] [facility];  Service: Cardiovascular;  Laterality: Left;     reports that she has never smoked. She has never used smokeless tobacco. She reports that she does not drink alcohol or use drugs.   family history includes Cancer in her maternal aunt; Diabetes in her father; Heart attack in her father; Heart failure  in her mother; Hypertension in her mother; Lung cancer in her maternal aunt.   Outpatient Medications Prior to Visit  Medication Sig Dispense Refill  . acetaminophen (TYLENOL 8 HOUR) 650 MG CR tablet Take 650 mg by mouth every 8 (eight) hours as needed for pain.    Marland Kitchen albuterol (PROVENTIL HFA;VENTOLIN HFA) 108 (90 Base) MCG/ACT inhaler Inhale 2 puffs into the lungs every 6 (six) hours as needed for wheezing or shortness of breath.    . allopurinol (ZYLOPRIM) 100 MG tablet Take 150 mg by mouth Every Tuesday,Thursday,and  Saturday with dialysis.     Marland Kitchen amitriptyline (ELAVIL) 25 MG tablet Take 25 mg by mouth at bedtime.     Marland Kitchen amLODipine (NORVASC) 10 MG tablet Take 10 mg by mouth daily.    Marland Kitchen atorvastatin (LIPITOR) 40 MG tablet Take 40 mg by mouth daily at 12 noon.    . carboxymethylcellulose (REFRESH PLUS) 0.5 % SOLN Place 1-2 drops into both eyes 3 (three) times daily as needed (for dry eyes).    . citalopram (CELEXA) 40 MG tablet Take 40 mg by mouth daily.     . clopidogrel (PLAVIX) 75 MG tablet Take 75 mg by mouth daily.     . diclofenac sodium (VOLTAREN) 1 % GEL Apply 1 g topically 2 (two) times daily as needed (for pain.).    Marland Kitchen diphenhydrAMINE (BENADRYL) 25 MG tablet Take 2 tablets (50 mg total) by mouth as directed. Take Friday morning 04/05/17 before procedure. 2 tablet 0  . docusate sodium (STOOL SOFTENER) 100 MG capsule Take 200 mg by mouth daily.     . fluticasone (FLONASE) 50 MCG/ACT nasal spray Place 2 sprays into both nostrils daily.     . GNP ALLERGY 25 MG tablet Take 25 mg by mouth at bedtime.   0  . hydroxypropyl methylcellulose / hypromellose (ISOPTO TEARS / GONIOVISC) 2.5 % ophthalmic solution Place 1-2 drops into both eyes 4 (four) times daily as needed for dry eyes.    . insulin glargine (LANTUS) 100 UNIT/ML injection Inject 15 Units into the skin daily.     Marland Kitchen lidocaine-prilocaine (EMLA) cream Apply 1 application topically as needed (prior to dialysis).    Marland Kitchen loratadine (CLARITIN) 10 MG tablet Take 10 mg by mouth at bedtime.    . Misc. Devices [facility]) MISC Frequency:PHARMDIR   Dosage:0.0     Instructions:  Note:Dx: Deconditioning with orthostatic hypotension Dose: 1    . nystatin (MYCOSTATIN/NYSTOP) powder Apply 1 g topically daily as needed (for skin rashes.).     Marland Kitchen omega-3 acid ethyl esters (LOVAZA) 1 g capsule Take 1 g by mouth 2 (two) times daily. NOON & BEDTIME    . OXYGEN Inhale 2 L into the lungs at bedtime.    . polyethylene glycol powder (GLYCOLAX/MIRALAX) powder Take 17 g by mouth  daily. With coffee    . predniSONE (DELTASONE) 50 MG tablet Take 1 tablet 04/04/17 at 6PM, Take 1 tablet 04/04/17 at bedtime, and take 1 tablet 04/05/17 in AM 3 tablet 0  . ranitidine (ZANTAC) 150 MG tablet Take 150 mg by mouth daily.   0  . sevelamer (RENAGEL) 800 MG tablet Take 800 mg by mouth 3 (three) times daily.     Marland Kitchen VICTOZA 18 MG/3ML SOPN Inject 10.8 mg into the skin daily.      No facility-administered medications prior to visit.      Allergies: Iodine; Enalapril; and Iodinated diagnostic agents    PHYSICAL EXAM: VS:  BP (!) 138/58 (BP  Location: Right Arm, Patient Position: Sitting, Cuff Size: Normal)   Pulse 93   Ht 5\' 6"  (1.676 m)   Wt 283 lb (128.4 kg)   BMI 45.68 kg/m  , Body mass index is 45.68 kg/m. Wt Readings from Last 3 Encounters:  04/17/17 283 lb (128.4 kg)  04/05/17 274 lb (124.3 kg)  04/02/17 273 lb 8 oz (124.1 kg)  GENERAL:  well developed, well nourished, obese, not in acute distress HEENT: normocephalic, pink conjunctivae, anicteric sclerae, no xanthelasma, normal dentition, oropharynx clear NECK:  no neck vein engorgement, JVP normal, no hepatojugular reflux, carotid upstroke brisk and symmetric, no bruit, no thyromegaly, no lymphadenopathy LUNGS:  good respiratory effort, clear to auscultation bilaterally CV:  PMI not displaced, no thrills, no lifts, S1 and S2 within normal limits, no palpable S3 or S4, no murmurs, no rubs, no gallops ABD:  Soft, nontender, nondistended, normoactive bowel sounds, no abdominal aortic bruit, no hepatomegaly, no splenomegaly MS: nontender back, no kyphosis, no scoliosis, no joint deformities EXT:  2+ DP/PT pulses, no edema, no varicosities, no cyanosis, no clubbing, (-) RFA hematoma/bruit SKIN: warm, nondiaphoretic, normal turgor, no ulcers NEUROPSYCH: alert, oriented to person, place, and time, sensory/motor grossly intact, normal mood, appropriate affect    Recent Labs: 04/04/2017: ALT 18; BUN 23; Creatinine, Ser 6.89;  Hemoglobin 12.3; Platelets 275; Potassium 4.1; Sodium 135   Lipid Panel    Component Value Date/Time   CHOL 154 03/24/2015 0531   TRIG 155 (H) 03/24/2015 0531   HDL 33 (L) 03/24/2015 0531   VLDL 31 03/24/2015 0531   LDLCALC 90 03/24/2015 0531     Other studies Reviewed:  EKG:  The ekg from 03/13/2017 was personally reviewed by me and it revealed sinus rhythm, 92 BPM, poor R-wave progression versus Q waves in V1 to V4, cannot rule out anterior infarct.  Additional studies/ records that were reviewed personally reviewed by me today include:  Echo 03/15/2017: Left ventricle: The cavity size was normal. Systolic function was   normal. The estimated ejection fraction was in the range of 50%   to 55%. Regional wall motion abnormalities cannot be excluded.   Doppler parameters are consistent with abnormal left ventricular   relaxation (grade 1 diastolic dysfunction). - Left atrium: The atrium was normal in size. - Right ventricle: Systolic function was normal. - Pulmonary arteries: Systolic pressure could not be accurately   estimated.  Impressions:  - Challenging image quality.  Nuclear stress test 03/28/2017:  T wave inversion was noted during stress in the aVL leads.  There was no ST segment deviation noted during stress.  Defect 1: There is a large defect of severe severity present in the basal anterior, basal anteroseptal, mid anterior, mid anteroseptal and apical anterior location.  Findings consistent with LAD ischemia.  This is a high risk study.  The left ventricular ejection fraction is mildly decreased (45-54%).  Suboptimal study due to breast attenuation. Can not rule out shifting breast attenuation although the defect persisted with attenuation correction.   LHCath 04/05/2017:  Prox LAD lesion, 30 %stenosed.   1. Mild nonobstructive coronary artery disease. 2. Mildly elevated left ventricular end-diastolic pressure. Left ventricular angiography was not  performed.  Recommendations: Falsely positive nuclear stress test likely due to breast attenuation.  ASSESSMENT AND PLAN:  Shortness of breath Abnormal EKG Nonobstructive CAD noted on heart cath Continue medical therapy. Risk factor modification.   Hypertension BP is well controlled. Continue monitoring BP. Continue current medical therapy and lifestyle changes.  Hyperlipidemia Patient on statin therapy. Ideal LDL goal is less than 70. PCP following labs.  Preoperative cardiac evaluation prior to AV fistula Left heart cath done, no obstructive CAD noted. Recommend medical therapy. No indication for further cardiac testing at this point. Patient is intermediate cardiac risk for planned procedure. Do not see any contraindication to proceeding with surgery from cardiac standpoint. The rest of her preoperative evaluation from PCP.  Current medicines are reviewed at length with the patient today.  The patient does not have concerns regarding medicines.  Labs/ tests ordered today include:  Orders Placed This Encounter  Procedures  . EKG 12-Lead    Disposition:   FU with Cardiology in 6 months   Signed, Carolyn Bushy, MD  04/17/2017 2:51 PM    [place]  This note was generated in part with voice recognition software and I apologize for any typographical errors that were not detected and corrected.

## 2017-04-17 NOTE — Telephone Encounter (Signed)
Office visit note routed to Dr. Lucky Cowboy and his office for cardiac clearance.

## 2017-04-17 NOTE — Patient Instructions (Signed)
Follow-Up: Your physician wants you to follow-up in: 6 months. You will receive a reminder letter in the mail two months in advance. If you don't receive a letter, please call our office to schedule the follow-up appointment.  It was a pleasure seeing you today here in the office. Please do not hesitate to give [place] a call back if you have any further questions. [place], BSN

## 2017-04-22 ENCOUNTER — Other Ambulatory Visit (INDEPENDENT_AMBULATORY_CARE_PROVIDER_SITE_OTHER): Payer: Self-pay | Admitting: Vascular Surgery

## 2017-04-22 ENCOUNTER — Encounter (INDEPENDENT_AMBULATORY_CARE_PROVIDER_SITE_OTHER): Payer: Self-pay

## 2017-04-22 ENCOUNTER — Ambulatory Visit (INDEPENDENT_AMBULATORY_CARE_PROVIDER_SITE_OTHER): Payer: Medicare Other | Admitting: Vascular Surgery

## 2017-04-22 ENCOUNTER — Encounter (INDEPENDENT_AMBULATORY_CARE_PROVIDER_SITE_OTHER): Payer: Self-pay | Admitting: Vascular Surgery

## 2017-04-22 VITALS — BP 141/64 | HR 97 | Resp 16 | Wt 285.0 lb

## 2017-04-22 DIAGNOSIS — I10 Essential (primary) hypertension: Secondary | ICD-10-CM

## 2017-04-22 DIAGNOSIS — I2510 Atherosclerotic heart disease of native coronary artery without angina pectoris: Secondary | ICD-10-CM

## 2017-04-22 DIAGNOSIS — Z992 Dependence on renal dialysis: Secondary | ICD-10-CM

## 2017-04-22 DIAGNOSIS — J449 Chronic obstructive pulmonary disease, unspecified: Secondary | ICD-10-CM | POA: Diagnosis not present

## 2017-04-22 DIAGNOSIS — E1122 Type 2 diabetes mellitus with diabetic chronic kidney disease: Secondary | ICD-10-CM

## 2017-04-22 DIAGNOSIS — N186 End stage renal disease: Secondary | ICD-10-CM | POA: Diagnosis not present

## 2017-04-22 NOTE — Progress Notes (Signed)
MRN : [id number]  [name] is a 61 y.o. (03/05/1956) female who presents with chief complaint of No chief complaint on file. Marland Kitchen  History of Present Illness:  The patient is seen for evaluation of dialysis access.  The patient has a history of left brachial .  There have been accesses in both arms and in the thighs.    Current access is via a catheter which is functioning poorly.  There have not been any recent episodes of catheter infection.  The patient denies amaurosis fugax or recent TIA symptoms. There are no recent neurological changes noted. The patient denies claudication symptoms or rest pain symptoms. The patient denies history of DVT, PE or superficial thrombophlebitis. The patient denies recent episodes of angina or shortness of breath.   No outpatient prescriptions have been marked as taking for the 04/22/17 encounter (Appointment) with Delana Meyer, Dolores Lory, MD.    Past Medical History:  Diagnosis Date  . Anemia   . Anxiety   . Arthritis   . Cancer [facility])    Left Kidney Cancer  . CHF (congestive heart failure) ([place])   . Chronic kidney disease   . COPD (chronic obstructive pulmonary disease) (HCC)    Use Oxygen at bedtime  . Diabetes mellitus without complication ([place])   . Dialysis patient ([place])    Tues, Thurs, Sat  . Dyspnea    with exertion  . GERD (gastroesophageal reflux disease)   . Gout   . History of kidney stones   . Hypertension   . Neuropathy   . Peripheral vascular disease ([place])   . Sleep apnea    OSA--USE C-PAP  . Stroke [facility]) 2004    Past Surgical History:  Procedure Laterality Date  . ABDOMINAL HYSTERECTOMY    . DIALYSIS/PERMA CATHETER INSERTION N/A 01/31/2017   Procedure: Dialysis/Perma Catheter Insertion;  Surgeon: Algernon Huxley, MD;  Location: [place] [facility];  Service: Cardiovascular;  Laterality: N/A;  . DIALYSIS/PERMA CATHETER INSERTION N/A 02/21/2017   Procedure: Dialysis/Perma Catheter Insertion;  Surgeon: Algernon Huxley, MD;   Location: [place] [facility];  Service: Cardiovascular;  Laterality: N/A;  . DIALYSIS/PERMA CATHETER INSERTION N/A 03/19/2017   Procedure: Dialysis/Perma Catheter Insertion;  Surgeon: Katha Cabal, MD;  Location: [place] [facility];  Service: Cardiovascular;  Laterality: N/A;  . EYE SURGERY Bilateral    Cataract Extraction with IOL  . KIDNEY SURGERY Left    Partial Nephrectomy  . LEFT HEART CATH AND CORONARY ANGIOGRAPHY N/A 04/05/2017   Procedure: Left Heart Cath and Coronary Angiography;  Surgeon: Wellington Hampshire, MD;  Location: [place] [facility];  Service: Cardiovascular;  Laterality: N/A;  . PERIPHERAL VASCULAR CATHETERIZATION N/A 01/23/2016   Procedure: Dialysis/Perma Catheter Insertion;  Surgeon: Algernon Huxley, MD;  Location: [place] [facility];  Service: Cardiovascular;  Laterality: N/A;  . PERIPHERAL VASCULAR CATHETERIZATION Left 10/01/2016   Procedure: A/V Shuntogram/Fistulagram;  Surgeon: Algernon Huxley, MD;  Location: [place] [facility];  Service: Cardiovascular;  Laterality: Left;    Social History Social History  Substance Use Topics  . Smoking status: Never Smoker  . Smokeless tobacco: Never Used  . Alcohol use No    Family History Family History  Problem Relation Age of Onset  . Hypertension Mother   . Heart failure Mother   . Diabetes Father   . Heart attack Father   . Lung cancer Maternal Aunt   . Cancer Maternal Aunt     Allergies  Allergen Reactions  . Iodine Rash  . Enalapril Other (See Comments)    TONGUE SWELLS/GOUT  . Iodinated Diagnostic Agents Other (See Comments)    BREAK OUT IN WHELPS       REVIEW OF SYSTEMS (Negative unless checked)  Constitutional: [] Weight loss  [] Fever  [] Chills Cardiac: [] Chest pain   [] Chest pressure   [] Palpitations   [] Shortness of breath when laying flat   [] Shortness of breath with exertion. Vascular:  [] Pain in legs with walking   [] Pain in legs at rest  [] History of DVT   [] Phlebitis   [] Swelling in legs    [] Varicose veins   [] Non-healing ulcers Pulmonary:   [] Uses home oxygen   [] Productive cough   [] Hemoptysis   [] Wheeze  [] COPD   [] Asthma Neurologic:  [] Dizziness   [] Seizures   [] History of stroke   [] History of TIA  [] Aphasia   [] Vissual changes   [] Weakness or numbness in arm   [] Weakness or numbness in leg Musculoskeletal:   [] Joint swelling   [] Joint pain   [] Low back pain Hematologic:  [] Easy bruising  [] Easy bleeding   [] Hypercoagulable state   [] Anemic Gastrointestinal:  [] Diarrhea   [] Vomiting  [] Gastroesophageal reflux/heartburn   [] Difficulty swallowing. Genitourinary:  [] Chronic kidney disease   [] Difficult urination  [] Frequent urination   [] Blood in urine Skin:  [] Rashes   [] Ulcers  Psychological:  [] History of anxiety   []  History of major depression.  Physical Examination  There were no vitals filed for this visit. There is no height or weight on file to calculate BMI. Gen: WD/WN, NAD Head: [place]/AT, No temporalis wasting.  Ear/Nose/Throat: Hearing grossly intact, nares w/o erythema or drainage Eyes: PER, EOMI, sclera nonicteric.  Neck: Supple, no large masses.   Pulmonary:  Good air movement, no audible wheezing bilaterally, no use of accessory muscles.  Cardiac: RRR, no JVD Vascular: femoral cath intact, left brachial cephalic fistula pulsatile with poor thrill, thinning of the skin over the two aneurysmal areas Vessel Right Left  Radial Palpable Palpable  Ulnar Palpable Palpable  Brachial Palpable Palpable  Gastrointestinal: Non-distended. No guarding/no peritoneal signs.  Musculoskeletal: M/S 5/5 throughout.  No deformity or atrophy.  Neurologic: CN 2-12 intact. Symmetrical.  Speech is fluent. Motor exam as listed above. Psychiatric: Judgment intact, Mood & affect appropriate for pt's clinical situation. Dermatologic: No rashes or ulcers noted.  No changes consistent with cellulitis. Lymph : No lichenification or skin changes of chronic lymphedema.  CBC Lab Results    Component Value Date   WBC 7.1 04/04/2017   HGB 12.3 04/04/2017   HCT 37.7 04/04/2017   MCV 91.1 04/04/2017   PLT 275 04/04/2017    BMET    Component Value Date/Time   NA 135 04/04/2017 1218   NA 129 (L) 03/31/2015 0420   K 4.1 04/04/2017 1218   K 4.2 03/31/2015 0420   CL 96 (L) 04/04/2017 1218   CL 88 (L) 03/31/2015 0420   CO2 28 04/04/2017 1218   CO2 28 03/31/2015 0420   GLUCOSE 138 (H) 04/04/2017 1218   GLUCOSE 144 (H) 03/31/2015 0420   BUN 23 (H) 04/04/2017 1218   BUN 61 (H) 03/31/2015 0420   CREATININE 6.89 (H) 04/04/2017 1218   CREATININE 10.20 (H) 03/31/2015 0420   CALCIUM 9.5 04/04/2017 1218   CALCIUM 7.0 (LL) 03/31/2015 0420   GFRNONAA 6 (L) 04/04/2017 1218   GFRNONAA 4 (L) 03/31/2015 0420   GFRAA 7 (L) 04/04/2017 1218   GFRAA 4 (L) 03/31/2015 1027  Estimated Creatinine Clearance: 11.6 mL/min (A) (by C-G formula based on SCr of 6.89 mg/dL (H)).  COAG Lab Results  Component Value Date   INR 1.01 04/04/2017   INR 1.02 02/25/2017   INR 1.0 06/14/2014    Radiology Nm Myocar Multi W/spect W/wall Motion / Ef  Result Date: 03/28/2017  T wave inversion was noted during stress in the aVL leads.  There was no ST segment deviation noted during stress.  Defect 1: There is a large defect of severe severity present in the basal anterior, basal anteroseptal, mid anterior, mid anteroseptal and apical anterior location.  Findings consistent with LAD ischemia.  This is a high risk study.  The left ventricular ejection fraction is mildly decreased (45-54%).  Suboptimal study due to breast attenuation. Can not rule out shifting breast attenuation although the defect persisted with attenuation correction.     Assessment/Plan 1. End stage kidney disease ([place]) Recommend:  The patient is experiencing increasing problems with their dialysis access.  Patient should have a fistulagram with the intention for intervention.  The intention for intervention is to restore  appropriate flow and prevent thrombosis and possible loss of the access.  As well as improve the quality of dialysis therapy.    Also bilateral venograms will be done to assess the possibility for a new access  The risks, benefits and alternative therapies were reviewed in detail with the patient.  All questions were answered.  The patient agrees to proceed with angio/intervention.     2. Essential hypertension Continue antihypertensive medications as already ordered, these medications have been reviewed and there are no changes at this time.   3. Type 2 diabetes mellitus with chronic kidney disease on chronic dialysis, unspecified whether [place] term insulin use ([place]) Continue hypoglycemic medications as already ordered, these medications have been reviewed and there are no changes at this time.  Hgb A1C to be monitored as already arranged by primary service   4. CAFL (chronic airflow limitation) (HCC) Continue pulmonary medications and aerosols as already ordered, these medications have been reviewed and there are no changes at this time.      [name], MD  04/22/2017 10:28 AM

## 2017-04-25 MED ORDER — CEFAZOLIN SODIUM-DEXTROSE 1-4 GM/50ML-% IV SOLN: 1.0000 g | Freq: Once | INTRAVENOUS | Status: DC

## 2017-04-26 ENCOUNTER — Encounter
Admission: RE | Disposition: A | Discharge status: Home / Self Care | Payer: Self-pay | Source: Ambulatory Visit | Attending: Vascular Surgery

## 2017-04-26 ENCOUNTER — Ambulatory Visit
Admission: RE | Admit: 2017-04-26 | Discharge: 2017-04-26 | Disposition: A | Discharge status: Home / Self Care | Payer: Medicare Other | Source: Ambulatory Visit | Attending: Vascular Surgery | Admitting: Vascular Surgery

## 2017-04-26 DIAGNOSIS — Z992 Dependence on renal dialysis: Secondary | ICD-10-CM | POA: Insufficient documentation

## 2017-04-26 DIAGNOSIS — T82868A Thrombosis of vascular prosthetic devices, implants and grafts, initial encounter: Secondary | ICD-10-CM

## 2017-04-26 DIAGNOSIS — N186 End stage renal disease: Secondary | ICD-10-CM | POA: Insufficient documentation

## 2017-04-26 HISTORY — PX: A/V FISTULAGRAM: CATH118298

## 2017-04-26 HISTORY — PX: UPPER EXTREMITY VENOGRAPHY: CATH118272

## 2017-04-26 LAB — GLUCOSE, CAPILLARY: Glucose-Capillary: 204 mg/dL — ABNORMAL HIGH (ref 65–99)

## 2017-04-26 LAB — POTASSIUM (ARMC VASCULAR LAB ONLY): Potassium (ARMC vascular lab): 4.9 (ref 3.5–5.1)

## 2017-04-26 SURGERY — A/V FISTULAGRAM
Anesthesia: Moderate Sedation | Laterality: Left

## 2017-04-26 MED ORDER — FENTANYL CITRATE (PF) 100 MCG/2ML IJ SOLN
INTRAMUSCULAR | Status: AC
  Filled 2017-04-26: qty 2

## 2017-04-26 MED ORDER — MIDAZOLAM HCL 5 MG/5ML IJ SOLN
INTRAMUSCULAR | Status: AC
  Filled 2017-04-26: qty 5

## 2017-04-26 MED ORDER — ONDANSETRON HCL 4 MG/2ML IJ SOLN: 4.0000 mg | Freq: Four times a day (QID) | INTRAMUSCULAR | Status: DC | PRN

## 2017-04-26 MED ORDER — LIDOCAINE HCL (PF) 1 % IJ SOLN
INTRAMUSCULAR | Status: AC
  Filled 2017-04-26: qty 30

## 2017-04-26 MED ORDER — CEFAZOLIN SODIUM-DEXTROSE 2-4 GM/100ML-% IV SOLN
INTRAVENOUS | Status: AC
  Administered 2017-04-26: 2 g via INTRAVENOUS
  Filled 2017-04-26: qty 100

## 2017-04-26 MED ORDER — CEFAZOLIN SODIUM-DEXTROSE 2-4 GM/100ML-% IV SOLN
2.0000 g | Freq: Once | INTRAVENOUS | Status: AC
  Administered 2017-04-26: 2 g via INTRAVENOUS

## 2017-04-26 MED ORDER — FENTANYL CITRATE (PF) 100 MCG/2ML IJ SOLN
INTRAMUSCULAR | Status: DC | PRN
  Administered 2017-04-26: 50 ug via INTRAVENOUS

## 2017-04-26 MED ORDER — HYDROMORPHONE HCL 1 MG/ML IJ SOLN: 1.0000 mg | Freq: Once | INTRAMUSCULAR | Status: DC | PRN

## 2017-04-26 MED ORDER — HEPARIN SODIUM (PORCINE) 1000 UNIT/ML IJ SOLN
INTRAMUSCULAR | Status: AC
  Filled 2017-04-26: qty 1

## 2017-04-26 MED ORDER — SODIUM CHLORIDE 0.9 % IV SOLN
INTRAVENOUS | Status: DC
  Administered 2017-04-26 (×2): via INTRAVENOUS

## 2017-04-26 MED ORDER — HEPARIN (PORCINE) IN NACL 2-0.9 UNIT/ML-% IJ SOLN
INTRAMUSCULAR | Status: AC
  Filled 2017-04-26: qty 500

## 2017-04-26 MED ORDER — METHYLPREDNISOLONE SODIUM SUCC 125 MG IJ SOLR: 125.0000 mg | INTRAMUSCULAR | Status: DC | PRN

## 2017-04-26 MED ORDER — IOPAMIDOL (ISOVUE-300) INJECTION 61%
INTRAVENOUS | Status: DC | PRN
  Administered 2017-04-26: 15 mL via INTRA_ARTERIAL

## 2017-04-26 MED ORDER — MIDAZOLAM HCL 2 MG/2ML IJ SOLN
INTRAMUSCULAR | Status: DC | PRN
  Administered 2017-04-26: 1 mg via INTRAVENOUS
  Administered 2017-04-26: 2 mg via INTRAVENOUS

## 2017-04-26 MED ORDER — FAMOTIDINE 20 MG PO TABS: 40.0000 mg | ORAL_TABLET | ORAL | Status: DC | PRN

## 2017-04-26 SURGICAL SUPPLY — 10 items
CATH BEACON 5 .035 65 KMP TIP (CATHETERS) ×3 IMPLANT
COVER PROBE U/S 5X48 (MISCELLANEOUS) ×3 IMPLANT
GUIDEWIRE ANGLED .035 180CM (WIRE) ×3 IMPLANT
NEEDLE ENTRY 21GA 7CM ECHOTIP (NEEDLE) ×3 IMPLANT
PACK ANGIOGRAPHY (CUSTOM PROCEDURE TRAY) ×3 IMPLANT
SET INTRO CAPELLA COAXIAL (SET/KITS/TRAYS/PACK) ×3 IMPLANT
SHEATH BRITE TIP 6FRX5.5 (SHEATH) ×3 IMPLANT
SHIELD RADPAD SCOOP 12X17 (MISCELLANEOUS) ×3 IMPLANT
TOWEL OR 17X26 4PK STRL BLUE (TOWEL DISPOSABLE) ×3 IMPLANT
WIRE NITINOL .018 (WIRE) ×3 IMPLANT

## 2017-04-26 NOTE — Discharge Instructions (Signed)
Moderate Conscious Sedation, Adult, Care After °These instructions provide you with information about caring for yourself after your procedure. Your health care provider may also give you more specific instructions. Your treatment has been planned according to current medical practices, but problems sometimes occur. Call your health care provider if you have any problems or questions after your procedure. °What can I expect after the procedure? °After your procedure, it is common: °· To feel sleepy for several hours. °· To feel clumsy and have poor balance for several hours. °· To have poor judgment for several hours. °· To vomit if you eat too soon. °Follow these instructions at home: °For at least 24 hours after the procedure:  ° °· Do not: °¨ Participate in activities where you could fall or become injured. °¨ Drive. °¨ Use heavy machinery. °¨ Drink alcohol. °¨ Take sleeping pills or medicines that cause drowsiness. °¨ Make important decisions or sign legal documents. °¨ Take care of children on your own. °· Rest. °Eating and drinking  °· Follow the diet recommended by your health care provider. °· If you vomit: °¨ Drink water, juice, or soup when you can drink without vomiting. °¨ Make sure you have little or no nausea before eating solid foods. °General instructions  °· Have a responsible adult stay with you until you are awake and alert. °· Take over-the-counter and prescription medicines only as told by your health care provider. °· If you smoke, do not smoke without supervision. °· Keep all follow-up visits as told by your health care provider. This is important. °Contact a health care provider if: °· You keep feeling nauseous or you keep vomiting. °· You feel light-headed. °· You develop a rash. °· You have a fever. °Get help right away if: °· You have trouble breathing. °This information is not intended to replace advice given to you by your health care provider. Make sure you discuss any questions you  have with your health care provider. °Document Released: 09/09/2013 Document Revised: 04/23/2016 Document Reviewed: 03/10/2016 °Elsevier Interactive Patient Education © 2017 [employer] Inc[employer] °Fistulogram, Care After °Refer to this sheet in the next few weeks. These instructions provide you with information on caring for yourself after your procedure. Your health care provider may also give you more specific instructions. Your treatment has been planned according to current medical practices, but problems sometimes occur. Call your health care provider if you have any problems or questions after your procedure. °What can I expect after the procedure? °After your procedure, it is typical to have the following: °· A small amount of discomfort in the area where the catheters were placed. °· A small amount of bruising around the fistula. °· Sleepiness and fatigue. °Follow these instructions at home: °· Rest at home for the day following your procedure. °· Do not drive or operate heavy machinery while taking pain medicine. °· Take medicines only as directed by your health care provider. °· Do not take baths, swim, or use a hot tub until your health care provider approves. You may shower 24 hours after the procedure or as directed by your health care provider. °· There are many different ways to close and cover an incision, including stitches, skin glue, and adhesive strips. Follow your health care provider's instructions on: °¨ Incision care. °¨ Bandage (dressing) changes and removal. °¨ Incision closure removal. °· Monitor your dialysis fistula carefully. °Contact a health care provider if: °· You have drainage, redness, swelling, or pain at your catheter site. °· You   have a fever. °· You have chills. °Get help right away if: °· You feel weak. °· You have trouble balancing. °· You have trouble moving your arms or legs. °· You have problems with your speech or vision. °· You can no longer feel a vibration or buzz when you  put your fingers over your dialysis fistula. °· The limb that was used for the procedure: °¨ Swells. °¨ Is painful. °¨ Is cold. °¨ Is discolored, such as blue or pale white. °This information is not intended to replace advice given to you by your health care provider. Make sure you discuss any questions you have with your health care provider. °Document Released: 04/05/2014 Document Revised: 04/26/2016 Document Reviewed: 01/08/2014 °Elsevier Interactive Patient Education © 2017 Elsevier Inc. ° °

## 2017-04-26 NOTE — H&P (Signed)
[facility] History & Physical Update  The patient was interviewed and re-examined.  The patient's previous History and Physical has been reviewed and is unchanged.  There is no change in the plan of care. We plan to proceed with the scheduled procedure.  [name], MD  04/26/2017, 10:40 AM

## 2017-04-26 NOTE — Op Note (Signed)
OPERATIVE NOTE   PROCEDURE: 1. Contrast injection left brachiocephalic fistula  PRE-OPERATIVE DIAGNOSIS: Complication of dialysis access                                                       End Stage Renal Disease  POST-OPERATIVE DIAGNOSIS: same as above   SURGEON: Katha Cabal, M.D.  ANESTHESIA: Conscious sedation was administered under my direct supervision by the interventional radiology RN.  IV Versed plus fentanyl were utilized. Continuous ECG, pulse oximetry and blood pressure was monitored throughout the entire procedure.  Conscious sedation was for a total of 25 minutes.  ESTIMATED BLOOD LOSS: minimal  FINDING(S): 1. String sign within the left subclavian vein  SPECIMEN(S):  None  CONTRAST: 15 cc  FLUOROSCOPY TIME: 3.3 minutes  INDICATIONS: [name] is a 61 y.o. female who  presents with malfunctioning left brachiocephalic AV access.  The patient is scheduled for angiography with possible intervention of the AV access to prevent loss of the permanent access.  The patient is aware the risks include but are not limited to: bleeding, infection, thrombosis of the cannulated access, and possible anaphylactic reaction to the contrast.  The patient acknowledges if the access can not be salvaged a tunneled catheter will be needed and will be placed during this procedure.  The patient is aware of the risks of the procedure and elects to proceed with the angiogram and intervention.  DESCRIPTION: After full informed written consent was obtained, the patient was brought back to the Special Procedure suite and placed supine position.  Appropriate cardiopulmonary monitors were placed.  The left arm was prepped and draped in the standard fashion.  Appropriate timeout is called.   The left brachiocephalic access was cannulated with a micropuncture needle under ultrasound guidence.  Ultrasound was used to evaluate the left brachiocephalic fistula access.  It was echolucent and  compressible indicating it is patent .  An ultrasound image was acquired for the permanent record.  A micropuncture needle was used to access the left brachial cephalic fistula which was access under direct ultrasound guidance.  The microwire was then advanced under fluoroscopic guidance without difficulty followed by the micro-sheath.  The J-wire was then advanced and a 6 Fr sheath inserted.  Hand injections were completed to image the access from the arterial anastomosis through the entire access.  Glidewire and Kumpe catheter were then negotiated into the central venous system. The central venous structures were also imaged by hand injections.  Based on the images,  the fistula itself demonstrates large aneurysmal dilatations. And by clinical examination the skin does appear to be deteriorated. 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 There is a moderate stenosis at the confluence of the cephalic vein with the subclavian. Just proximal to this there is a string sign extending over a distance of approximately 3-4 cm. The left innominate vein appears to be widely patent and the catheters easily negotiated into the superior vena cava which is widely patent..    A 4-0 Monocryl purse-string suture was sewn around the sheath.  The sheath was removed and light pressure was applied.  A sterile bandage was applied to the puncture site.  Summary:  Given the above findings I would recommend the patient undergo placement of a left arm hero graft.  Ultrasound demonstrates a patent jugular vein at the base of the neck and if there is difficulty accessing the jugular vein then the proximal cephalic vein could be accessed and the intravascular portion of the catheter be negotiated through the cephalic into the central venous system. At the same time the aneurysmal fistula could be resected and a new prosthetic graft could be utilized as the site of access.  COMPLICATIONS:  None  CONDITION: [name], M.D [facility] Office: [phone]  04/26/2017 11:14 AM

## 2017-04-30 ENCOUNTER — Encounter: Payer: Self-pay | Admitting: Vascular Surgery

## 2017-05-06 ENCOUNTER — Encounter (INDEPENDENT_AMBULATORY_CARE_PROVIDER_SITE_OTHER): Payer: Self-pay | Admitting: Vascular Surgery

## 2017-05-06 ENCOUNTER — Encounter (INDEPENDENT_AMBULATORY_CARE_PROVIDER_SITE_OTHER): Payer: Self-pay

## 2017-05-06 ENCOUNTER — Other Ambulatory Visit (INDEPENDENT_AMBULATORY_CARE_PROVIDER_SITE_OTHER): Payer: Self-pay | Admitting: Vascular Surgery

## 2017-05-06 ENCOUNTER — Ambulatory Visit (INDEPENDENT_AMBULATORY_CARE_PROVIDER_SITE_OTHER): Payer: Medicare Other | Admitting: Vascular Surgery

## 2017-05-06 VITALS — BP 127/72 | HR 92 | Resp 16 | Wt 284.0 lb

## 2017-05-06 DIAGNOSIS — I871 Compression of vein: Secondary | ICD-10-CM | POA: Insufficient documentation

## 2017-05-06 DIAGNOSIS — T829XXA Unspecified complication of cardiac and vascular prosthetic device, implant and graft, initial encounter: Secondary | ICD-10-CM | POA: Insufficient documentation

## 2017-05-06 DIAGNOSIS — I2510 Atherosclerotic heart disease of native coronary artery without angina pectoris: Secondary | ICD-10-CM | POA: Diagnosis not present

## 2017-05-06 DIAGNOSIS — I10 Essential (primary) hypertension: Secondary | ICD-10-CM

## 2017-05-06 DIAGNOSIS — N186 End stage renal disease: Secondary | ICD-10-CM

## 2017-05-06 DIAGNOSIS — T829XXS Unspecified complication of cardiac and vascular prosthetic device, implant and graft, sequela: Secondary | ICD-10-CM

## 2017-05-06 NOTE — Progress Notes (Signed)
MRN : [id number]  [name] is a 61 y.o. (08-Feb-1956) female who presents with chief complaint of  Chief Complaint  Patient presents with  . Follow-up  .  History of Present Illness:  The patient is seen for evaluation of dialysis access.  The patient has a history of multiple failed accesses.  There have been accesses in both arms.    Current access is via a catheter which is functioning poorly.  There have been several episodes of catheter infection.  The patient denies amaurosis fugax or recent TIA symptoms. There are no recent neurological changes noted. The patient denies claudication symptoms or rest pain symptoms. The patient denies history of DVT, PE or superficial thrombophlebitis. The patient denies recent episodes of angina or shortness of breath.    Current Meds  Medication Sig  . acetaminophen (TYLENOL 8 HOUR) 650 MG CR tablet Take 650 mg by mouth every 8 (eight) hours as needed for pain.  Marland Kitchen albuterol (PROVENTIL HFA;VENTOLIN HFA) 108 (90 Base) MCG/ACT inhaler Inhale 2 puffs into the lungs every 6 (six) hours as needed for wheezing or shortness of breath.  . allopurinol (ZYLOPRIM) 100 MG tablet Take 150 mg by mouth Every Tuesday,Thursday,and Saturday with dialysis.   Marland Kitchen amitriptyline (ELAVIL) 25 MG tablet Take 25 mg by mouth at bedtime.   Marland Kitchen amLODipine (NORVASC) 10 MG tablet Take 10 mg by mouth daily.  Marland Kitchen atorvastatin (LIPITOR) 40 MG tablet Take 40 mg by mouth daily at 12 noon.  . carboxymethylcellulose (REFRESH PLUS) 0.5 % SOLN Place 1-2 drops into both eyes 3 (three) times daily as needed (for dry eyes).  . citalopram (CELEXA) 40 MG tablet Take 40 mg by mouth daily.   . clopidogrel (PLAVIX) 75 MG tablet Take 75 mg by mouth daily.   . diclofenac sodium (VOLTAREN) 1 % GEL Apply 1 g topically 2 (two) times daily as needed (for pain.).  Marland Kitchen diphenhydrAMINE (BENADRYL) 25 MG tablet Take 2 tablets (50 mg total) by mouth as directed. Take Friday morning 04/05/17 before  procedure.  . docusate sodium (STOOL SOFTENER) 100 MG capsule Take 200 mg by mouth daily.   . fluticasone (FLONASE) 50 MCG/ACT nasal spray Place 2 sprays into both nostrils daily.   . GNP ALLERGY 25 MG tablet Take 25 mg by mouth at bedtime.   . hydroxypropyl methylcellulose / hypromellose (ISOPTO TEARS / GONIOVISC) 2.5 % ophthalmic solution Place 1-2 drops into both eyes 4 (four) times daily as needed for dry eyes.  Marland Kitchen lidocaine-prilocaine (EMLA) cream Apply 1 application topically as needed (prior to dialysis).  Marland Kitchen loratadine (CLARITIN) 10 MG tablet Take 10 mg by mouth at bedtime.  . Misc. Devices [facility]) MISC Frequency:PHARMDIR   Dosage:0.0     Instructions:  Note:Dx: Deconditioning with orthostatic hypotension Dose: 1  . nystatin (MYCOSTATIN/NYSTOP) powder Apply 1 g topically daily as needed (for skin rashes.).   Marland Kitchen omega-3 acid ethyl esters (LOVAZA) 1 g capsule Take 1 g by mouth 2 (two) times daily. NOON & BEDTIME  . OXYGEN Inhale 2 L into the lungs at bedtime.  . polyethylene glycol powder (GLYCOLAX/MIRALAX) powder Take 17 g by mouth daily. With coffee  . predniSONE (DELTASONE) 50 MG tablet Take 1 tablet 04/04/17 at 6PM, Take 1 tablet 04/04/17 at bedtime, and take 1 tablet 04/05/17 in AM  . ranitidine (ZANTAC) 150 MG tablet Take 150 mg by mouth daily.   Marland Kitchen RENVELA 800 MG tablet   . sevelamer (RENAGEL) 800 MG tablet Take 800 mg  by mouth 3 (three) times daily.   Marland Kitchen VICTOZA 18 MG/3ML SOPN Inject 0.3 mL (1.8 mg total) under the skin daily.    Past Medical History:  Diagnosis Date  . Anemia   . Anxiety   . Arthritis   . Cancer [facility])    Left Kidney Cancer  . CHF (congestive heart failure) ([place])   . Chronic kidney disease   . COPD (chronic obstructive pulmonary disease) ([facility])    Use Oxygen at bedtime  . Diabetes mellitus without complication ([place])   . Dialysis patient ([place])    Tues, Thurs, Sat  . Dyspnea    with exertion  . GERD (gastroesophageal reflux disease)   . Gout   . History of  kidney stones   . Hypertension   . Neuropathy   . Peripheral vascular disease ([place])   . Sleep apnea    OSA--USE C-PAP  . Stroke [facility]) 2004    Past Surgical History:  Procedure Laterality Date  . A/V SHUNTOGRAM Left 04/26/2017   Procedure: A/V Fistulagram;  Surgeon: Katha Cabal, MD;  Location: [place] [facility];  Service: Cardiovascular;  Laterality: Left;  . ABDOMINAL HYSTERECTOMY    . DIALYSIS/PERMA CATHETER INSERTION N/A 01/31/2017   Procedure: Dialysis/Perma Catheter Insertion;  Surgeon: Algernon Huxley, MD;  Location: [place] [facility];  Service: Cardiovascular;  Laterality: N/A;  . DIALYSIS/PERMA CATHETER INSERTION N/A 02/21/2017   Procedure: Dialysis/Perma Catheter Insertion;  Surgeon: Algernon Huxley, MD;  Location: [place] [facility];  Service: Cardiovascular;  Laterality: N/A;  . DIALYSIS/PERMA CATHETER INSERTION N/A 03/19/2017   Procedure: Dialysis/Perma Catheter Insertion;  Surgeon: Katha Cabal, MD;  Location: [place] [facility];  Service: Cardiovascular;  Laterality: N/A;  . EYE SURGERY Bilateral    Cataract Extraction with IOL  . KIDNEY SURGERY Left    Partial Nephrectomy  . LEFT HEART CATH AND CORONARY ANGIOGRAPHY N/A 04/05/2017   Procedure: Left Heart Cath and Coronary Angiography;  Surgeon: Wellington Hampshire, MD;  Location: [place] [facility];  Service: Cardiovascular;  Laterality: N/A;  . PERIPHERAL VASCULAR CATHETERIZATION N/A 01/23/2016   Procedure: Dialysis/Perma Catheter Insertion;  Surgeon: Algernon Huxley, MD;  Location: [place] [facility];  Service: Cardiovascular;  Laterality: N/A;  . PERIPHERAL VASCULAR CATHETERIZATION Left 10/01/2016   Procedure: A/V Shuntogram/Fistulagram;  Surgeon: Algernon Huxley, MD;  Location: [place] [facility];  Service: Cardiovascular;  Laterality: Left;  . UPPER EXTREMITY VENOGRAPHY Bilateral 04/26/2017   Procedure: Upper Extremity Venography;  Surgeon: Katha Cabal, MD;  Location: [place] [facility];  Service:  Cardiovascular;  Laterality: Bilateral;    Social History Social History  Substance Use Topics  . Smoking status: Never Smoker  . Smokeless tobacco: Never Used  . Alcohol use No    Family History Family History  Problem Relation Age of Onset  . Hypertension Mother   . Heart failure Mother   . Diabetes Father   . Heart attack Father   . Lung cancer Maternal Aunt   . Cancer Maternal Aunt     Allergies  Allergen Reactions  . Iodine Rash  . Enalapril Other (See Comments)    TONGUE SWELLS/GOUT  . Iodinated Diagnostic Agents Other (See Comments)    BREAK OUT IN WHELPS       REVIEW OF SYSTEMS (Negative unless checked)  Constitutional: [] Weight loss  [] Fever  [] Chills Cardiac: [] Chest pain   [] Chest pressure   [] Palpitations   [] Shortness of breath when laying flat   [  x]Shortness of breath with exertion. Vascular:  [] Pain in legs with walking   [] Pain in legs at rest  [] History of DVT   [] Phlebitis   [] Swelling in legs   [] Varicose veins   [] Non-healing ulcers Pulmonary:   [] Uses home oxygen   [] Productive cough   [] Hemoptysis   [] Wheeze  [] COPD   [] Asthma Neurologic:  [] Dizziness   [] Seizures   [] History of stroke   [] History of TIA  [] Aphasia   [] Vissual changes   [] Weakness or numbness in arm   [] Weakness or numbness in leg Musculoskeletal:   [] Joint swelling   [] Joint pain   [] Low back pain Hematologic:  [] Easy bruising  [] Easy bleeding   [] Hypercoagulable state   [] Anemic Gastrointestinal:  [] Diarrhea   [] Vomiting  [] Gastroesophageal reflux/heartburn   [] Difficulty swallowing. Genitourinary:  [x] Chronic kidney disease   [] Difficult urination  [] Frequent urination   [] Blood in urine Skin:  [] Rashes   [] Ulcers  Psychological:  [] History of anxiety   []  History of major depression.  Physical Examination  Vitals:   05/06/17 1029  BP: 127/72  Pulse: 92  Resp: 16  Weight: 284 lb (128.8 kg)   Body mass index is 45.84 kg/m. Gen: WD/WN, NAD Head: [place]/AT, No temporalis  wasting.  Ear/Nose/Throat: Hearing grossly intact, nares w/o erythema or drainage Eyes: PER, EOMI, sclera nonicteric.  Neck: Supple, no large masses.   Pulmonary:  Good air movement, no audible wheezing bilaterally, no use of accessory muscles.  Cardiac: RRR, no JVD Vascular: Multiple failed access cc bilateral upper extremities. Left IJ tunnel catheter present Vessel Right Left  Radial Palpable Palpable  Ulnar Palpable Palpable  Brachial Palpable Palpable  Carotid Palpable Palpable  Gastrointestinal: Non-distended. No guarding/no peritoneal signs.  Musculoskeletal: M/S 5/5 throughout.  No deformity or atrophy.  Neurologic: CN 2-12 intact. Symmetrical.  Speech is fluent. Motor exam as listed above. Psychiatric: Judgment intact, Mood & affect appropriate for pt's clinical situation. Dermatologic: No rashes or ulcers noted.  No changes consistent with cellulitis. Lymph : No lichenification or skin changes of chronic lymphedema.  CBC Lab Results  Component Value Date   WBC 7.1 04/04/2017   HGB 12.3 04/04/2017   HCT 37.7 04/04/2017   MCV 91.1 04/04/2017   PLT 275 04/04/2017    BMET    Component Value Date/Time   NA 135 04/04/2017 1218   NA 129 (L) 03/31/2015 0420   K 4.1 04/04/2017 1218   K 4.2 03/31/2015 0420   CL 96 (L) 04/04/2017 1218   CL 88 (L) 03/31/2015 0420   CO2 28 04/04/2017 1218   CO2 28 03/31/2015 0420   GLUCOSE 138 (H) 04/04/2017 1218   GLUCOSE 144 (H) 03/31/2015 0420   BUN 23 (H) 04/04/2017 1218   BUN 61 (H) 03/31/2015 0420   CREATININE 6.89 (H) 04/04/2017 1218   CREATININE 10.20 (H) 03/31/2015 0420   CALCIUM 9.5 04/04/2017 1218   CALCIUM 7.0 (LL) 03/31/2015 0420   GFRNONAA 6 (L) 04/04/2017 1218   GFRNONAA 4 (L) 03/31/2015 0420   GFRAA 7 (L) 04/04/2017 1218   GFRAA 4 (L) 03/31/2015 0420   CrCl cannot be calculated (Patient's most recent lab result is older than the maximum 21 days allowed.).  COAG Lab Results  Component Value Date   INR 1.01  04/04/2017   INR 1.02 02/25/2017   INR 1.0 06/14/2014    Radiology No results found.  Assessment/Plan 1. End stage kidney disease ([place]) Recommend:  At this time the patient does not have appropriate extremity  access for dialysis  Patient should have a left arm hero graft created.  The risks, benefits and alternative therapies were reviewed in detail with the patient.  All questions were answered.  The patient agrees to proceed with surgery.    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 Complication from renal dialysis device, sequela See #1 and 2  4. Essential hypertension Continue antihypertensive medications as already ordered, these medications have been reviewed and there are no changes at this time.     [name], MD  05/06/2017 1:01 PM

## 2017-05-13 ENCOUNTER — Encounter
Admission: RE | Admit: 2017-05-13 | Discharge: 2017-05-13 | Disposition: A | Discharge status: Home / Self Care | Payer: Medicare Other | Source: Ambulatory Visit | Attending: Vascular Surgery | Admitting: Vascular Surgery

## 2017-05-13 DIAGNOSIS — D649 Anemia, unspecified: Secondary | ICD-10-CM | POA: Diagnosis not present

## 2017-05-13 DIAGNOSIS — E1140 Type 2 diabetes mellitus with diabetic neuropathy, unspecified: Secondary | ICD-10-CM | POA: Diagnosis not present

## 2017-05-13 DIAGNOSIS — N186 End stage renal disease: Secondary | ICD-10-CM | POA: Insufficient documentation

## 2017-05-13 DIAGNOSIS — I132 Hypertensive heart and chronic kidney disease with heart failure and with stage 5 chronic kidney disease, or end stage renal disease: Secondary | ICD-10-CM | POA: Insufficient documentation

## 2017-05-13 DIAGNOSIS — E1122 Type 2 diabetes mellitus with diabetic chronic kidney disease: Secondary | ICD-10-CM | POA: Diagnosis not present

## 2017-05-13 DIAGNOSIS — Z01812 Encounter for preprocedural laboratory examination: Secondary | ICD-10-CM | POA: Insufficient documentation

## 2017-05-13 DIAGNOSIS — I509 Heart failure, unspecified: Secondary | ICD-10-CM | POA: Insufficient documentation

## 2017-05-13 DIAGNOSIS — Z992 Dependence on renal dialysis: Secondary | ICD-10-CM | POA: Diagnosis not present

## 2017-05-13 DIAGNOSIS — C642 Malignant neoplasm of left kidney, except renal pelvis: Secondary | ICD-10-CM | POA: Diagnosis not present

## 2017-05-13 DIAGNOSIS — I2510 Atherosclerotic heart disease of native coronary artery without angina pectoris: Secondary | ICD-10-CM | POA: Insufficient documentation

## 2017-05-13 DIAGNOSIS — K219 Gastro-esophageal reflux disease without esophagitis: Secondary | ICD-10-CM | POA: Diagnosis not present

## 2017-05-13 HISTORY — DX: Atherosclerotic heart disease of native coronary artery without angina pectoris: I25.10

## 2017-05-13 LAB — CBC WITH DIFFERENTIAL/PLATELET
Basophils Absolute: 0.1 10*3/uL (ref 0–0.1)
Basophils Relative: 1 %
Eosinophils Absolute: 0.2 10*3/uL (ref 0–0.7)
Eosinophils Relative: 2 %
HCT: 36.5 % (ref 35.0–47.0)
Hemoglobin: 12.1 g/dL (ref 12.0–16.0)
Lymphocytes Relative: 23 %
Lymphs Abs: 1.9 10*3/uL (ref 1.0–3.6)
MCH: 29.4 pg (ref 26.0–34.0)
MCHC: 33.2 g/dL (ref 32.0–36.0)
MCV: 88.8 fL (ref 80.0–100.0)
Monocytes Absolute: 0.7 10*3/uL (ref 0.2–0.9)
Monocytes Relative: 9 %
Neutro Abs: 5.2 10*3/uL (ref 1.4–6.5)
Neutrophils Relative %: 65 %
Platelets: 272 10*3/uL (ref 150–440)
RBC: 4.11 MIL/uL (ref 3.80–5.20)
RDW: 16.2 % — ABNORMAL HIGH (ref 11.5–14.5)
WBC: 8.1 10*3/uL (ref 3.6–11.0)

## 2017-05-13 LAB — SURGICAL PCR SCREEN
MRSA, PCR: NEGATIVE
Staphylococcus aureus: NEGATIVE

## 2017-05-13 LAB — BASIC METABOLIC PANEL
Anion gap: 11 (ref 5–15)
BUN: 41 mg/dL — ABNORMAL HIGH (ref 6–20)
CO2: 27 mmol/L (ref 22–32)
Calcium: 9.2 mg/dL (ref 8.9–10.3)
Chloride: 94 mmol/L — ABNORMAL LOW (ref 101–111)
Creatinine, Ser: 10.58 mg/dL — ABNORMAL HIGH (ref 0.44–1.00)
GFR calc Af Amer: 4 mL/min — ABNORMAL LOW (ref >60)
GFR calc non Af Amer: 3 mL/min — ABNORMAL LOW (ref >60)
Glucose, Bld: 143 mg/dL — ABNORMAL HIGH (ref 65–99)
Potassium: 4.2 mmol/L (ref 3.5–5.1)
Sodium: 132 mmol/L — ABNORMAL LOW (ref 135–145)

## 2017-05-13 LAB — APTT: aPTT: 30 seconds (ref 24–36)

## 2017-05-13 LAB — PROTIME-INR
INR: 1.04
Prothrombin Time: 13.6 seconds (ref 11.4–15.2)

## 2017-05-13 LAB — TYPE AND SCREEN
ABO/RH(D): A POS
Antibody Screen: NEGATIVE

## 2017-05-13 NOTE — Patient Instructions (Addendum)
Your procedure is scheduled on: May 22, 2017 [facility]) Report to Same Day Surgery 2nd floor medical mall ([place] Entrance-take elevator on left to 2nd floor.  Check in with surgery information desk.) To find out your arrival time please call [phone] between 1PM - 3PM on May 21, 2017 [facility])  Remember: Instructions that are not followed completely may result in serious medical risk, up to and including death, or upon the discretion of your surgeon and anesthesiologist your surgery may need to be rescheduled.    _x___ 1. Do not eat food or drink liquids after midnight. No gum chewing or hard candies                              __x__ 2. No Alcohol for 24 hours before or after surgery.   __x__3. No Smoking for 24 prior to surgery.   ____  4. Bring all medications with you on the day of surgery if instructed.    __x__ 5. Notify your doctor if there is any change in your medical condition     (cold, fever, infections).     Do not wear jewelry, make-up, hairpins, clips or nail polish.  Do not wear lotions, powders, or perfumes.   Do not shave 48 hours prior to surgery. Men may shave face and neck.  Do not bring valuables to the hospital.    [facility] is not responsible for any belongings or valuables.               Contacts, dentures or bridgework may not be worn into surgery.  Leave your suitcase in the car. After surgery it may be brought to your room.  For patients admitted to the hospital, discharge time is determined by your treatment team                       Patients discharged the day of surgery will not be allowed to drive home.  You will need someone to drive you home and stay with you the night of your procedure.    Please read over the following fact sheets that you were given:   [facility] Preparing for Surgery and or MRSA Information   _x___ Take the following medications the morning of surgery with a sip of water:  1. AMLODIPINE  2. LIPITOR  3.  CITALOPRAM  4. BRING BENADRYL WITH YOU TO HOSPITAL THE DAY OF SURGERY   .  ____Fleets enema or Magnesium Citrate as directed.   _x___ Use CHG Soap or sage wipes as directed on instruction sheet   _x___ Use inhalers on the day of surgery and bring to hospital day of surgery  (USE ALBUTEROL INHALER THE MORNING  OF SURGERY AND  BRING WITH YOU TO HOSPITAL)  ____ Stop Metformin and Janumet 2 days prior to surgery.    ____ Take 1/2 of usual insulin dose the night before surgery and none on the morning surgery. NO VICTOZA THE MORNING OF SURGERY !   _x___ Follow recommendations from Cardiologist, Pulmonologist or PCP regarding          stopping Aspirin, Coumadin, Plavix ,Eliquis, Effient, or Pradaxa, and Pletal. (STOP PLAVIX FOUR DAYS PRIOR TO SURGERY) STOP PLAVIX ON JUNE  16 ) X____Stop Anti-inflammatories such as Advil, Aleve, Ibuprofen, Motrin, Naproxen, Naprosyn, Goodies powders or aspirin products. OK to take Tylenol   (STOP DICLOFENAC GEL NOW )  _x___ Stop supplements until after surgery.  But may continue Vitamin D, Vitamin B, and multivitamin. (STOP OMEGA-3 NOW)       _x__ Bring C-Pap to the hospital.

## 2017-05-13 NOTE — Pre-Procedure Instructions (Signed)
Cardiac clearance on chart from Dr. Wende Bushy.

## 2017-05-15 ENCOUNTER — Other Ambulatory Visit: Payer: Medicare Other

## 2017-05-22 ENCOUNTER — Ambulatory Visit
Admission: RE | Admit: 2017-05-22 | Discharge: 2017-05-22 | Disposition: A | Discharge status: Home / Self Care | Payer: Medicare Other | Source: Ambulatory Visit | Attending: Vascular Surgery | Admitting: Vascular Surgery

## 2017-05-22 ENCOUNTER — Encounter: Payer: Self-pay | Admitting: *Deleted

## 2017-05-22 ENCOUNTER — Encounter
Admission: RE | Disposition: A | Discharge status: Home / Self Care | Payer: Self-pay | Source: Ambulatory Visit | Attending: Vascular Surgery

## 2017-05-22 ENCOUNTER — Ambulatory Visit: Payer: Medicare Other | Admitting: Anesthesiology

## 2017-05-22 ENCOUNTER — Ambulatory Visit: Payer: Medicare Other

## 2017-05-22 DIAGNOSIS — J449 Chronic obstructive pulmonary disease, unspecified: Secondary | ICD-10-CM | POA: Insufficient documentation

## 2017-05-22 DIAGNOSIS — I2510 Atherosclerotic heart disease of native coronary artery without angina pectoris: Secondary | ICD-10-CM | POA: Diagnosis not present

## 2017-05-22 DIAGNOSIS — N186 End stage renal disease: Secondary | ICD-10-CM | POA: Diagnosis not present

## 2017-05-22 DIAGNOSIS — E1022 Type 1 diabetes mellitus with diabetic chronic kidney disease: Secondary | ICD-10-CM | POA: Insufficient documentation

## 2017-05-22 DIAGNOSIS — Z8673 Personal history of transient ischemic attack (TIA), and cerebral infarction without residual deficits: Secondary | ICD-10-CM | POA: Insufficient documentation

## 2017-05-22 DIAGNOSIS — Y832 Surgical operation with anastomosis, bypass or graft as the cause of abnormal reaction of the patient, or of later complication, without mention of misadventure at the time of the procedure: Secondary | ICD-10-CM | POA: Diagnosis not present

## 2017-05-22 DIAGNOSIS — D631 Anemia in chronic kidney disease: Secondary | ICD-10-CM | POA: Insufficient documentation

## 2017-05-22 DIAGNOSIS — I509 Heart failure, unspecified: Secondary | ICD-10-CM | POA: Insufficient documentation

## 2017-05-22 DIAGNOSIS — F419 Anxiety disorder, unspecified: Secondary | ICD-10-CM | POA: Diagnosis not present

## 2017-05-22 DIAGNOSIS — Z6841 Body Mass Index (BMI) 40.0 and over, adult: Secondary | ICD-10-CM | POA: Insufficient documentation

## 2017-05-22 DIAGNOSIS — T82898A Other specified complication of vascular prosthetic devices, implants and grafts, initial encounter: Secondary | ICD-10-CM | POA: Insufficient documentation

## 2017-05-22 DIAGNOSIS — I132 Hypertensive heart and chronic kidney disease with heart failure and with stage 5 chronic kidney disease, or end stage renal disease: Secondary | ICD-10-CM | POA: Insufficient documentation

## 2017-05-22 DIAGNOSIS — I82C11 Acute embolism and thrombosis of right internal jugular vein: Secondary | ICD-10-CM | POA: Diagnosis not present

## 2017-05-22 DIAGNOSIS — T82868A Thrombosis of vascular prosthetic devices, implants and grafts, initial encounter: Secondary | ICD-10-CM

## 2017-05-22 DIAGNOSIS — G4730 Sleep apnea, unspecified: Secondary | ICD-10-CM | POA: Diagnosis not present

## 2017-05-22 HISTORY — PX: VASCULAR ACCESS DEVICE INSERTION: SHX5158

## 2017-05-22 LAB — POCT I-STAT 4, (NA,K, GLUC, HGB,HCT)
Glucose, Bld: 103 mg/dL — ABNORMAL HIGH (ref 65–99)
HCT: 38 % (ref 36.0–46.0)
Hemoglobin: 12.9 g/dL (ref 12.0–15.0)
Potassium: 4.4 mmol/L (ref 3.5–5.1)
Sodium: 134 mmol/L — ABNORMAL LOW (ref 135–145)

## 2017-05-22 LAB — GLUCOSE, CAPILLARY
Glucose-Capillary: 103 mg/dL — ABNORMAL HIGH (ref 65–99)
Glucose-Capillary: 225 mg/dL — ABNORMAL HIGH (ref 65–99)

## 2017-05-22 SURGERY — INSERTION, CATHETER, HERO
Anesthesia: General | Laterality: Left | Wound class: Clean

## 2017-05-22 MED ORDER — HEPARIN SODIUM (PORCINE) 5000 UNIT/ML IJ SOLN
INTRAMUSCULAR | Status: AC
  Filled 2017-05-22: qty 1

## 2017-05-22 MED ORDER — ROCURONIUM BROMIDE 100 MG/10ML IV SOLN
INTRAVENOUS | Status: DC | PRN
  Administered 2017-05-22 (×3): 10 mg via INTRAVENOUS
  Administered 2017-05-22: 30 mg via INTRAVENOUS
  Administered 2017-05-22 (×2): 10 mg via INTRAVENOUS

## 2017-05-22 MED ORDER — HEPARIN SODIUM (PORCINE) 1000 UNIT/ML IJ SOLN
INTRAMUSCULAR | Status: DC | PRN
  Administered 2017-05-22: 14:00:00 100 mL via INTRAMUSCULAR

## 2017-05-22 MED ORDER — SODIUM CHLORIDE 0.9 % IV SOLN
INTRAVENOUS | Status: DC
  Administered 2017-05-22 (×2): via INTRAVENOUS

## 2017-05-22 MED ORDER — METHYLPREDNISOLONE SODIUM SUCC 125 MG IJ SOLR
INTRAMUSCULAR | Status: DC | PRN
  Administered 2017-05-22: 125 mg via INTRAVENOUS

## 2017-05-22 MED ORDER — METOPROLOL TARTRATE 5 MG/5ML IV SOLN
INTRAVENOUS | Status: DC | PRN
  Administered 2017-05-22: 2 mg via INTRAVENOUS
  Administered 2017-05-22: 3 mg via INTRAVENOUS

## 2017-05-22 MED ORDER — OXYCODONE-ACETAMINOPHEN 5-325 MG PO TABS
1.0000 | ORAL_TABLET | Freq: Four times a day (QID) | ORAL | Status: DC | PRN
  Administered 2017-05-22: 1 via ORAL

## 2017-05-22 MED ORDER — ONDANSETRON HCL 4 MG/2ML IJ SOLN
INTRAMUSCULAR | Status: DC | PRN
  Administered 2017-05-22: 4 mg via INTRAVENOUS

## 2017-05-22 MED ORDER — PHENYLEPHRINE HCL 10 MG/ML IJ SOLN
INTRAVENOUS | Status: DC | PRN
  Administered 2017-05-22 (×2): 30 ug/min via INTRAVENOUS

## 2017-05-22 MED ORDER — PHENYLEPHRINE HCL 10 MG/ML IJ SOLN
INTRAMUSCULAR | Status: DC | PRN
  Administered 2017-05-22 (×3): 100 ug via INTRAVENOUS
  Administered 2017-05-22: 200 ug via INTRAVENOUS

## 2017-05-22 MED ORDER — EPHEDRINE SULFATE 50 MG/ML IJ SOLN
INTRAMUSCULAR | Status: DC | PRN
  Administered 2017-05-22 (×2): 10 mg via INTRAVENOUS

## 2017-05-22 MED ORDER — LIDOCAINE 2% (20 MG/ML) 5 ML SYRINGE
INTRAMUSCULAR | Status: DC | PRN
  Administered 2017-05-22: 100 mg via INTRAVENOUS

## 2017-05-22 MED ORDER — CEFAZOLIN SODIUM-DEXTROSE 2-4 GM/100ML-% IV SOLN
2.0000 g | INTRAVENOUS | Status: AC
  Administered 2017-05-22: 2 g via INTRAVENOUS

## 2017-05-22 MED ORDER — FAMOTIDINE 20 MG PO TABS
20.0000 mg | ORAL_TABLET | Freq: Once | ORAL | Status: AC
  Administered 2017-05-22: 20 mg via ORAL

## 2017-05-22 MED ORDER — SUCCINYLCHOLINE CHLORIDE 20 MG/ML IJ SOLN
INTRAMUSCULAR | Status: DC | PRN
  Administered 2017-05-22: 100 mg via INTRAVENOUS

## 2017-05-22 MED ORDER — BUPIVACAINE-EPINEPHRINE (PF) 0.25% -1:200000 IJ SOLN
INTRAMUSCULAR | Status: AC
  Filled 2017-05-22: qty 30

## 2017-05-22 MED ORDER — CHLORHEXIDINE GLUCONATE CLOTH 2 % EX PADS: 6.0000 | MEDICATED_PAD | Freq: Once | CUTANEOUS | Status: DC

## 2017-05-22 MED ORDER — FENTANYL CITRATE (PF) 100 MCG/2ML IJ SOLN
INTRAMUSCULAR | Status: AC
  Filled 2017-05-22: qty 2

## 2017-05-22 MED ORDER — SUGAMMADEX SODIUM 200 MG/2ML IV SOLN
INTRAVENOUS | Status: AC
  Filled 2017-05-22: qty 2

## 2017-05-22 MED ORDER — ROCURONIUM BROMIDE 50 MG/5ML IV SOLN
INTRAVENOUS | Status: AC
  Filled 2017-05-22: qty 1

## 2017-05-22 MED ORDER — ONDANSETRON HCL 4 MG/2ML IJ SOLN: 4.0000 mg | Freq: Once | INTRAMUSCULAR | Status: DC | PRN

## 2017-05-22 MED ORDER — CEFAZOLIN SODIUM-DEXTROSE 2-4 GM/100ML-% IV SOLN
INTRAVENOUS | Status: AC
  Filled 2017-05-22: qty 100

## 2017-05-22 MED ORDER — FENTANYL CITRATE (PF) 100 MCG/2ML IJ SOLN
INTRAMUSCULAR | Status: DC | PRN
Start: 2017-05-22 — End: 2017-05-22
  Administered 2017-05-22: 100 ug via INTRAVENOUS
  Administered 2017-05-22: 25 ug via INTRAVENOUS

## 2017-05-22 MED ORDER — SUGAMMADEX SODIUM 200 MG/2ML IV SOLN
INTRAVENOUS | Status: DC | PRN
  Administered 2017-05-22: 257.6 mg via INTRAVENOUS

## 2017-05-22 MED ORDER — SEVOFLURANE IN SOLN
RESPIRATORY_TRACT | Status: AC
  Filled 2017-05-22: qty 250

## 2017-05-22 MED ORDER — ONDANSETRON HCL 4 MG/2ML IJ SOLN
INTRAMUSCULAR | Status: AC
  Filled 2017-05-22: qty 2

## 2017-05-22 MED ORDER — DIPHENHYDRAMINE HCL 50 MG/ML IJ SOLN
INTRAMUSCULAR | Status: DC | PRN
  Administered 2017-05-22: 25 mg via INTRAVENOUS

## 2017-05-22 MED ORDER — OXYCODONE-ACETAMINOPHEN 5-325 MG PO TABS: 1.0000 | ORAL_TABLET | Freq: Four times a day (QID) | ORAL | 0 refills | Status: DC | PRN

## 2017-05-22 MED ORDER — PROPOFOL 10 MG/ML IV BOLUS
INTRAVENOUS | Status: AC
  Filled 2017-05-22: qty 20

## 2017-05-22 MED ORDER — FAMOTIDINE 20 MG PO TABS
ORAL_TABLET | ORAL | Status: AC
  Administered 2017-05-22: 20 mg via ORAL
  Filled 2017-05-22: qty 1

## 2017-05-22 MED ORDER — IOPAMIDOL (ISOVUE-300) INJECTION 61%
INTRAVENOUS | Status: DC | PRN
  Administered 2017-05-22: 10 mL via INTRAVENOUS

## 2017-05-22 MED ORDER — DEXMEDETOMIDINE HCL 200 MCG/2ML IV SOLN
INTRAVENOUS | Status: DC | PRN
  Administered 2017-05-22: 12 ug via INTRAVENOUS

## 2017-05-22 MED ORDER — FENTANYL CITRATE (PF) 100 MCG/2ML IJ SOLN: 25.0000 ug | INTRAMUSCULAR | Status: DC | PRN

## 2017-05-22 MED ORDER — OXYCODONE-ACETAMINOPHEN 5-325 MG PO TABS
ORAL_TABLET | ORAL | Status: AC
  Administered 2017-05-22: 1 via ORAL
  Filled 2017-05-22: qty 1

## 2017-05-22 MED ORDER — LIDOCAINE HCL (PF) 2 % IJ SOLN
INTRAMUSCULAR | Status: AC
  Filled 2017-05-22: qty 2

## 2017-05-22 MED ORDER — PROPOFOL 10 MG/ML IV BOLUS
INTRAVENOUS | Status: DC | PRN
  Administered 2017-05-22: 150 mg via INTRAVENOUS

## 2017-05-22 SURGICAL SUPPLY — 86 items
APPLIER CLIP 9.375 SM OPEN (CLIP)
BAG DECANTER FOR FLEXI CONT (MISCELLANEOUS) ×2 IMPLANT
BALLN DORADO 10X40X80 (BALLOONS) ×2
BALLOON DORADO 10X40X80 (BALLOONS) ×1 IMPLANT
BLADE SURG 15 STRL LF DISP TIS (BLADE) ×1 IMPLANT
BLADE SURG 15 STRL SS (BLADE) ×1
BLADE SURG SZ11 CARB STEEL (BLADE) ×2 IMPLANT
BOOT SUTURE AID YELLOW STND (SUTURE) ×2 IMPLANT
BRUSH SCRUB 4% CHG (MISCELLANEOUS) ×2 IMPLANT
CANISTER SUCT 1200ML W/VALVE (MISCELLANEOUS) ×2 IMPLANT
CANNULA 5F STIFF (CANNULA) ×2 IMPLANT
CATH BEACON 5 .035 40 KMP TP (CATHETERS) ×1 IMPLANT
CATH BEACON 5 .038 40 KMP TP (CATHETERS) ×1
CHLORAPREP W/TINT 26ML (MISCELLANEOUS) ×4 IMPLANT
CLIP APPLIE 9.375 SM OPEN (CLIP) IMPLANT
COMPONENT HERO ACCESSORY KIT (VASCULAR PRODUCTS) ×2 IMPLANT
COMPONENT HERO ARTERIAL GRAFT (Vascular Products) ×2 IMPLANT
COMPONENT HERO VENOUS OVERFLOW (Vascular Products) ×2 IMPLANT
COVER PROBE FLX POLY STRL (MISCELLANEOUS) ×2 IMPLANT
DERMABOND ADVANCED (GAUZE/BANDAGES/DRESSINGS) ×1
DERMABOND ADVANCED .7 DNX12 (GAUZE/BANDAGES/DRESSINGS) ×1 IMPLANT
DEVICE PRESTO INFLATION (MISCELLANEOUS) ×2 IMPLANT
DRAPE C-ARM XRAY 36X54 (DRAPES) ×2 IMPLANT
DRAPE INCISE 23X17 IOBAN STRL (DRAPES) ×1
DRAPE INCISE IOBAN 23X17 STRL (DRAPES) ×1 IMPLANT
DRAPE INCISE IOBAN 66X45 STRL (DRAPES) IMPLANT
DRAPE SHEET LG 3/4 BI-LAMINATE (DRAPES) ×2 IMPLANT
DRESSING SURGICEL FIBRLLR 1X2 (HEMOSTASIS) ×1 IMPLANT
DRSG OPSITE POSTOP 3X4 (GAUZE/BANDAGES/DRESSINGS) ×4 IMPLANT
DRSG OPSITE POSTOP 4X12 (GAUZE/BANDAGES/DRESSINGS) ×2 IMPLANT
DRSG SURGICEL FIBRILLAR 1X2 (HEMOSTASIS) ×2
DRYSEAL FLEXSHEATH 12FR 33CM (SHEATH) ×1
ELECT CAUTERY BLADE 6.4 (BLADE) ×2 IMPLANT
ELECT REM PT RETURN 9FT ADLT (ELECTROSURGICAL) ×2
ELECTRODE REM PT RTRN 9FT ADLT (ELECTROSURGICAL) ×1 IMPLANT
GEL ULTRASOUND 20GR AQUASONIC (MISCELLANEOUS) IMPLANT
GLIDEWIRE STIFF .35X180X3 HYDR (WIRE) ×2 IMPLANT
GLOVE BIO SURGEON STRL SZ7 (GLOVE) ×6 IMPLANT
GLOVE INDICATOR 7.5 STRL GRN (GLOVE) ×6 IMPLANT
GLOVE SURG SYN 8.0 (GLOVE) ×2 IMPLANT
GOWN STRL REUS W/ TWL LRG LVL3 (GOWN DISPOSABLE) ×2 IMPLANT
GOWN STRL REUS W/ TWL XL LVL3 (GOWN DISPOSABLE) ×1 IMPLANT
GOWN STRL REUS W/TWL LRG LVL3 (GOWN DISPOSABLE) ×2
GOWN STRL REUS W/TWL XL LVL3 (GOWN DISPOSABLE) ×1
GUIDEWIRE SUPER STIFF .035X180 (WIRE) ×4 IMPLANT
IV NS 500ML (IV SOLUTION) ×1
IV NS 500ML BAXH (IV SOLUTION) ×1 IMPLANT
KIT RM TURNOVER STRD PROC AR (KITS) ×2 IMPLANT
LABEL OR SOLS (LABEL) ×2 IMPLANT
LOOP RED MAXI  1X406MM (MISCELLANEOUS) ×2
LOOP VESSEL MAXI 1X406 RED (MISCELLANEOUS) ×2 IMPLANT
LOOP VESSEL MINI 0.8X406 BLUE (MISCELLANEOUS) ×1 IMPLANT
LOOPS BLUE MINI 0.8X406MM (MISCELLANEOUS) ×1
NEEDLE FILTER BLUNT 18X 1/2SAF (NEEDLE) ×1
NEEDLE FILTER BLUNT 18X1 1/2 (NEEDLE) ×1 IMPLANT
NEEDLE HYPO 25X1 1.5 SAFETY (NEEDLE) IMPLANT
NS IRRIG 500ML POUR BTL (IV SOLUTION) ×2 IMPLANT
PACK ANGIOGRAPHY (CUSTOM PROCEDURE TRAY) ×2 IMPLANT
PACK BASIN MAJOR ARMC (MISCELLANEOUS) ×2 IMPLANT
PACK UNIVERSAL (MISCELLANEOUS) ×2 IMPLANT
PAD PREP 24X41 OB/GYN DISP (PERSONAL CARE ITEMS) ×2 IMPLANT
SET INTRO CAPELLA COAXIAL (SET/KITS/TRAYS/PACK) ×2 IMPLANT
SHEATH BRITE TIP 8FRX11 (SHEATH) ×2 IMPLANT
SHEATH DRYSEAL FLEX 12FR 33CM (SHEATH) ×1 IMPLANT
SPONGE LAP 18X18 5 PK (GAUZE/BANDAGES/DRESSINGS) ×2 IMPLANT
STAPLER SKIN PROX 35W (STAPLE) ×2 IMPLANT
STOCKINETTE STRL 6IN 960660 (GAUZE/BANDAGES/DRESSINGS) ×2 IMPLANT
SUT ETHIBOND 0 (SUTURE) ×2 IMPLANT
SUT ETHIBOND CT1 BRD #0 30IN (SUTURE) ×2 IMPLANT
SUT GTX CV-6 30 (SUTURE) ×6 IMPLANT
SUT MNCRL+ 5-0 UNDYED PC-3 (SUTURE) ×1 IMPLANT
SUT MONOCRYL 5-0 (SUTURE) ×1
SUT PROLENE 6 0 BV (SUTURE) ×4 IMPLANT
SUT SILK 2 0 (SUTURE) ×1
SUT SILK 2 0 SH (SUTURE) ×2 IMPLANT
SUT SILK 2-0 18XBRD TIE 12 (SUTURE) ×1 IMPLANT
SUT SILK 3 0 (SUTURE) ×1
SUT SILK 3-0 18XBRD TIE 12 (SUTURE) ×1 IMPLANT
SUT SILK 4 0 (SUTURE) ×1
SUT SILK 4-0 18XBRD TIE 12 (SUTURE) ×1 IMPLANT
SUT VIC AB 3-0 SH 27 (SUTURE) ×7
SUT VIC AB 3-0 SH 27X BRD (SUTURE) ×7 IMPLANT
SYR 20CC LL (SYRINGE) ×2 IMPLANT
SYR 5ML LL (SYRINGE) ×2 IMPLANT
SYRINGE 10CC LL (SYRINGE) IMPLANT
WIRE J 3MM .035X145CM (WIRE) ×2 IMPLANT

## 2017-05-22 NOTE — Anesthesia Procedure Notes (Signed)
Procedure Name: Intubation Date/Time: 05/22/2017 11:22 AM Performed by: Marsh Dolly Pre-anesthesia Checklist: Patient identified, Patient being monitored, Timeout performed, Emergency Drugs available and Suction available Patient Re-evaluated:Patient Re-evaluated prior to inductionOxygen Delivery Method: Circle system utilized Preoxygenation: Pre-oxygenation with 100% oxygen Intubation Type: IV induction Ventilation: Mask ventilation without difficulty Laryngoscope Size: Mac and 3 Grade View: Grade I Tube type: Oral Tube size: 7.5 mm Number of attempts: 1 Placement Confirmation: ETT inserted through vocal cords under direct vision,  positive ETCO2 and breath sounds checked- equal and bilateral Secured at: 21 cm Tube secured with: Tape Dental Injury: Teeth and Oropharynx as per pre-operative assessment

## 2017-05-22 NOTE — Transfer of Care (Signed)
Immediate Anesthesia Transfer of Care Note  Patient: [name]  Procedure(s) Performed: Procedure(s): INSERTION OF HERO VASCULAR ACCESS DEVICE (Left)  Patient Location: PACU  Anesthesia Type:General  Level of Consciousness: awake, alert  and oriented  Airway & Oxygen Therapy: Patient Spontanous Breathing and Patient connected to face mask oxygen  Post-op Assessment: Report given to RN and Post -op Vital signs reviewed and stable  Post vital signs: Reviewed and stable  Last Vitals:  Vitals:   05/22/17 0830  BP: 140/64  Pulse: 87  Resp: 16  Temp: 36.3 C    Last Pain:  Vitals:   05/22/17 0830  TempSrc: Oral         Complications: No apparent anesthesia complications

## 2017-05-22 NOTE — Anesthesia Preprocedure Evaluation (Addendum)
Anesthesia Evaluation  Patient identified by MRN, date of birth, ID band Patient awake    Reviewed: Allergy & Precautions, NPO status , Patient's Chart, lab work & pertinent test results  Airway Mallampati: III       Dental  (+) Upper Dentures, Lower Dentures   Pulmonary sleep apnea , COPD,  COPD inhaler,     + decreased breath sounds      Cardiovascular Exercise Tolerance: Poor hypertension, Pt. on medications and Pt. on home beta blockers + CAD and +CHF   Rhythm:Regular     Neuro/Psych Anxiety CVA, No Residual Symptoms    GI/Hepatic Neg liver ROS, GERD  Medicated,  Endo/Other  diabetes, Type 1Morbid obesity  Renal/GU DialysisRenal disease     Musculoskeletal   Abdominal (+) + obese,   Peds negative pediatric ROS (+)  Hematology  (+) anemia ,   Anesthesia Other Findings   Reproductive/Obstetrics                            Anesthesia Physical Anesthesia Plan  ASA: III  Anesthesia Plan: General   Post-op Pain Management:    Induction: Intravenous  PONV Risk Score and Plan: 1 and Ondansetron  Airway Management Planned: Oral ETT  Additional Equipment:   Intra-op Plan:   Post-operative Plan: Extubation in OR  Informed Consent: I have reviewed the patients History and Physical, chart, labs and discussed the procedure including the risks, benefits and alternatives for the proposed anesthesia with the patient or authorized representative who has indicated his/her understanding and acceptance.     Plan Discussed with: CRNA  Anesthesia Plan Comments:         Anesthesia Quick Evaluation

## 2017-05-22 NOTE — Anesthesia Post-op Follow-up Note (Cosign Needed)
Anesthesia QCDR form completed.        

## 2017-05-22 NOTE — Op Note (Signed)
[facility]  OPERATIVE NOTE   PROCEDURE:  1. Ultrasound-guided access to left internal jugular vein with insertion of the intravascular portion of the hero graft via this approach.   2. Left jugular venogram and superior venacavogram. 3. Percutaneous transluminal angioplasty of the left proximal internal jugular and left innominate vein with a 10 mm by 40 cm Dorado balloon 4. Left arm HERO graft placement 5.  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 end stage renal disease  2. Multiple failed previous dialysis accesses 3.  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 The patient is brought in for a HERO graft on the left arm.   Given that her existing fistula is quite painful and aneurysmal it will be resected this will also allow for proper positioning of the PTFE portion of the hero graft. Risk, benefits, and alternatives to access surgery were discussed.  The patient is aware the risks include but are not limited to: bleeding, infection, steal syndrome, nerve damage, ischemic monomelic neuropathy, failure to mature, and need for additional procedures.  The patient is aware of the risks and elects to proceed forward.  DESCRIPTION: After full informed written consent was obtained from the patient, the patient was brought back to the operating room and placed supine upon the operating table.  The patient was given IV  antibiotics prior to proceeding.  After obtaining adequate sedation, the patient was prepped and draped in standard fashion for a left  arm access procedure. I started by gaining access to the left jugular vein with a micropuncture kit.  This was accessed under direct ultrasound guidance without difficulty with a micropuncture needle and a permanent image was recorded. A wire and then an 8 Fr. Sheath was placed.  Imaging was performed which demonstrated a stricture of the proximal internal jugular as well as the distal half of the innominate vein on the left.  I then proceeded with angioplasty of the innominate stricture using a 10 mm x 40 mm Dorado balloon inflated to 20 atm for 1 minute. Follow-up imaging demonstrated significant improvement.    I proceeded with intravascular portion of the HERO graft placement. The intravascular portion of the HERO graft was then brought onto the field. This was placed over the wire through the peel-away sheath and parked in the right atrium with fluoroscopic guidance. The peel-away sheath was removed and the central portion of the HERO graft was clamped after it was locally heparinized.    I turned my attention next to the antecubitum.  I made an incision over the brachial artery, and dissected down through the subcutaneous tissue to the fascia carefully and was able to dissect out the anastomosis of the brachial cephalic fistula.  The incision was then carried longitudinally along the fistula and the entire fistula was skeletonized. It was then dissected circumferentially. A second incision was made on the other side of the aneurysms and carried down through the soft tissues. The area of the fistula in the shoulder was then ligated and divided between 2-0 silk ties and 0 Ethibond. Distally down  near the arterial anastomosis it was ligated with an 0 Ethibond. The fistula itself was then resected and passed off the field as specimen. Hemostasis was then obtained with Bovie  cautery and 3-0 Vicryl as needed. The wound was then reapproximated in 2 layers using a combination of interrupted and running 3-0 Vicryl leaving the distal aspect of the incision open in preparation for creating the anastomosis. Specimen was then passed off the field.   The perianastomotic portion of the fistula was patent and of adequate size to support a graft.  It was controlled proximally with a vascular clamp and then I turned my attention to the shoulder. Incision was created at the shoulder and the previously placed central portion of the HERO graft was tunneled from the jugular incision to the shoulder incision and clamped.  I took a metal tunneler and dissected from the antecubital up to the shoulder incision.  Then I delivered the PTFE portion of the graft, through this metal tunneler and then pulled out the metal tunneler leaving the graft in place and making sure the line was up for orientation.    The PTFE portion of the graft was then connected to the central venous portion of the hero graft. Fluoroscopy was used to confirm that the distal portion of the HERO was parked in the right atrium. The grommet area was then secured with an 0 Ethibond around the venous outflow and then 2 stitches were used to secure the hero graft to the fascia in the deltopectoral groove.  I then checked the PTFE portion for proper length and beveled this to match the perianastomotic region.  I sewed the graft to this arteriotomy with a running stitch of CV-6 suture.  At this point, then I completed the anastomosis in the usual fashion.  I released the vessel loops on the inflow and allowed the artery to decompress through the graft. There was good pulsatile flow demonstrated immediately through the HERO graft.  Fluoroscopy was used to confirm the catheter tip to be in central venous location without kinking or twisting of any portion of the graft. Surgicel and Evicel were then placed. There was no more active  bleeding.  The subcutaneous tissue was reapproximated with a running stitch of 3-0 Vicryl.   the skin of all 3 incisions was then reapproximated with staples and honeycomb dressings were applied.    The patient was then awakened from anesthesia and taken to the recovery room in stable condition having tolerated the procedure well.    COMPLICATIONS: None  CONDITION: Stable   [name] 05/22/2017 4:15 PM   This note was created with Dragon Medical transcription system. Any errors in dictation are purely unintentional.

## 2017-05-22 NOTE — H&P (Signed)
[facility] History & Physical Update  The patient was interviewed and re-examined.  The patient's previous History and Physical has been reviewed and is unchanged.  There is no change in the plan of care. We plan to proceed with the scheduled procedure.  [name], MD  05/22/2017, 10:14 AM

## 2017-05-22 NOTE — Discharge Instructions (Signed)
  AMBULATORY SURGERY  DISCHARGE INSTRUCTIONS   1) The drugs that you were given will stay in your system until tomorrow so for the next 24 hours you should not:  A) Drive an automobile B) Make any legal decisions C) Drink any alcoholic beverage   2) You may resume regular meals tomorrow.  Today it is better to start with liquids and gradually work up to solid foods.  You may eat anything you prefer, but it is better to start with liquids, then soup and crackers, and gradually work up to solid foods.   3) Please notify your doctor immediately if you have any unusual bleeding, trouble breathing, redness and pain at the surgery site, drainage, fever, or pain not relieved by medication.    4) Additional Instructions: TAKE A STOOL SOFTENER TWICE A DAY WHILE TAKING NARCOTIC PAIN MEDICINE TO PREVENT CONSTIPATION   Please contact your physician with any problems or Same Day Surgery at [phone], Monday through Friday 6 am to 4 pm, or [place] [facility] [place] Main number at [phone].   

## 2017-05-23 ENCOUNTER — Telehealth (INDEPENDENT_AMBULATORY_CARE_PROVIDER_SITE_OTHER): Payer: Self-pay

## 2017-05-23 ENCOUNTER — Encounter: Payer: Self-pay | Admitting: Vascular Surgery

## 2017-05-23 NOTE — Telephone Encounter (Signed)
[name] from [facility] in [place] called stating the patient was there with dressing from her surgery on 05/22/17 and wanted them to change it. Per [name] the dressing on the patient should stay for 24 to 48 hours before changing.

## 2017-05-24 LAB — SURGICAL PATHOLOGY

## 2017-05-27 ENCOUNTER — Telehealth (INDEPENDENT_AMBULATORY_CARE_PROVIDER_SITE_OTHER): Payer: Self-pay

## 2017-05-27 ENCOUNTER — Emergency Department
Admission: EM | Admit: 2017-05-27 | Discharge: 2017-05-27 | Disposition: A | Discharge status: Home / Self Care | Payer: Medicare Other | Attending: Emergency Medicine | Admitting: Emergency Medicine

## 2017-05-27 ENCOUNTER — Encounter: Payer: Self-pay | Admitting: Emergency Medicine

## 2017-05-27 DIAGNOSIS — I509 Heart failure, unspecified: Secondary | ICD-10-CM | POA: Diagnosis not present

## 2017-05-27 DIAGNOSIS — E119 Type 2 diabetes mellitus without complications: Secondary | ICD-10-CM | POA: Diagnosis not present

## 2017-05-27 DIAGNOSIS — N186 End stage renal disease: Secondary | ICD-10-CM | POA: Insufficient documentation

## 2017-05-27 DIAGNOSIS — Z794 Long term (current) use of insulin: Secondary | ICD-10-CM | POA: Diagnosis not present

## 2017-05-27 DIAGNOSIS — I2510 Atherosclerotic heart disease of native coronary artery without angina pectoris: Secondary | ICD-10-CM | POA: Diagnosis not present

## 2017-05-27 DIAGNOSIS — Z79899 Other long term (current) drug therapy: Secondary | ICD-10-CM | POA: Diagnosis not present

## 2017-05-27 DIAGNOSIS — J449 Chronic obstructive pulmonary disease, unspecified: Secondary | ICD-10-CM | POA: Insufficient documentation

## 2017-05-27 DIAGNOSIS — I119 Hypertensive heart disease without heart failure: Secondary | ICD-10-CM | POA: Insufficient documentation

## 2017-05-27 DIAGNOSIS — Z8673 Personal history of transient ischemic attack (TIA), and cerebral infarction without residual deficits: Secondary | ICD-10-CM | POA: Diagnosis not present

## 2017-05-27 DIAGNOSIS — Z85528 Personal history of other malignant neoplasm of kidney: Secondary | ICD-10-CM | POA: Insufficient documentation

## 2017-05-27 DIAGNOSIS — I120 Hypertensive chronic kidney disease with stage 5 chronic kidney disease or end stage renal disease: Secondary | ICD-10-CM | POA: Diagnosis not present

## 2017-05-27 DIAGNOSIS — Z7902 Long term (current) use of antithrombotics/antiplatelets: Secondary | ICD-10-CM | POA: Insufficient documentation

## 2017-05-27 DIAGNOSIS — L03114 Cellulitis of left upper limb: Secondary | ICD-10-CM | POA: Insufficient documentation

## 2017-05-27 DIAGNOSIS — Z87891 Personal history of nicotine dependence: Secondary | ICD-10-CM | POA: Diagnosis not present

## 2017-05-27 DIAGNOSIS — Y69 Unspecified misadventure during surgical and medical care: Secondary | ICD-10-CM | POA: Insufficient documentation

## 2017-05-27 DIAGNOSIS — T814XXA Infection following a procedure, initial encounter: Secondary | ICD-10-CM | POA: Diagnosis present

## 2017-05-27 LAB — COMPREHENSIVE METABOLIC PANEL
ALT: 6 U/L — ABNORMAL LOW (ref 14–54)
AST: 11 U/L — ABNORMAL LOW (ref 15–41)
Albumin: 3 g/dL — ABNORMAL LOW (ref 3.5–5.0)
Alkaline Phosphatase: 112 U/L (ref 38–126)
Anion gap: 11 (ref 5–15)
BUN: 45 mg/dL — ABNORMAL HIGH (ref 6–20)
CO2: 27 mmol/L (ref 22–32)
Calcium: 8.3 mg/dL — ABNORMAL LOW (ref 8.9–10.3)
Chloride: 94 mmol/L — ABNORMAL LOW (ref 101–111)
Creatinine, Ser: 9.58 mg/dL — ABNORMAL HIGH (ref 0.44–1.00)
GFR calc Af Amer: 4 mL/min — ABNORMAL LOW (ref >60)
GFR calc non Af Amer: 4 mL/min — ABNORMAL LOW (ref >60)
Glucose, Bld: 122 mg/dL — ABNORMAL HIGH (ref 65–99)
Potassium: 4.4 mmol/L (ref 3.5–5.1)
Sodium: 132 mmol/L — ABNORMAL LOW (ref 135–145)
Total Bilirubin: 0.5 mg/dL (ref 0.3–1.2)
Total Protein: 7.1 g/dL (ref 6.5–8.1)

## 2017-05-27 LAB — CBC WITH DIFFERENTIAL/PLATELET
Basophils Absolute: 0.1 10*3/uL (ref 0–0.1)
Basophils Relative: 1 %
Eosinophils Absolute: 0.2 10*3/uL (ref 0–0.7)
Eosinophils Relative: 3 %
HCT: 31.2 % — ABNORMAL LOW (ref 35.0–47.0)
Hemoglobin: 10.4 g/dL — ABNORMAL LOW (ref 12.0–16.0)
Lymphocytes Relative: 24 %
Lymphs Abs: 1.9 10*3/uL (ref 1.0–3.6)
MCH: 29.8 pg (ref 26.0–34.0)
MCHC: 33.4 g/dL (ref 32.0–36.0)
MCV: 89.1 fL (ref 80.0–100.0)
Monocytes Absolute: 0.7 10*3/uL (ref 0.2–0.9)
Monocytes Relative: 9 %
Neutro Abs: 4.8 10*3/uL (ref 1.4–6.5)
Neutrophils Relative %: 63 %
Platelets: 278 10*3/uL (ref 150–440)
RBC: 3.5 MIL/uL — ABNORMAL LOW (ref 3.80–5.20)
RDW: 16.3 % — ABNORMAL HIGH (ref 11.5–14.5)
WBC: 7.7 10*3/uL (ref 3.6–11.0)

## 2017-05-27 LAB — LACTIC ACID, PLASMA: Lactic Acid, Venous: 1.5 mmol/L (ref 0.5–1.9)

## 2017-05-27 MED ORDER — FENTANYL CITRATE (PF) 100 MCG/2ML IJ SOLN
100.0000 ug | Freq: Once | INTRAMUSCULAR | Status: AC
  Administered 2017-05-27: 100 ug via INTRAVENOUS
  Filled 2017-05-27: qty 2

## 2017-05-27 MED ORDER — OXYCODONE HCL 5 MG PO TABS
5.0000 mg | ORAL_TABLET | Freq: Once | ORAL | Status: AC
  Administered 2017-05-27: 5 mg via ORAL
  Filled 2017-05-27: qty 1

## 2017-05-27 MED ORDER — VANCOMYCIN HCL IN DEXTROSE 1-5 GM/200ML-% IV SOLN
1000.0000 mg | Freq: Once | INTRAVENOUS | Status: AC
  Administered 2017-05-27: 1000 mg via INTRAVENOUS
  Filled 2017-05-27: qty 200

## 2017-05-27 MED ORDER — SULFAMETHOXAZOLE-TRIMETHOPRIM 800-160 MG PO TABS: 1.0000 | ORAL_TABLET | Freq: Two times a day (BID) | ORAL | 0 refills | Status: DC

## 2017-05-27 NOTE — ED Triage Notes (Signed)
Pt states on Wed 6/20 had a graft placed to L upper arm for dialysis by Dr. Delana Meyer. Pt states since then has dark brown/green drainage to bandage, as well as redness and swelling to either side of site. Skin on either side of site is also noted to be warm to the touch and firm to palpation. Pt also c/o pain, states has been taking oxycodone for pain without relief.

## 2017-05-27 NOTE — ED Notes (Addendum)
Pt received 500 mg / 100 ml of vancomycin . This nurse noticed swelling to IV site; did attempt to withdraw vanc. Notified provider Triplett of infiltration then discontinued IV. Ice applied to area

## 2017-05-27 NOTE — Telephone Encounter (Signed)
Patient's husband called and stated that she is swollen and red with a little greenish/yellow coloring.   [phone]

## 2017-05-27 NOTE — ED Provider Notes (Signed)
[facility] Emergency Department Provider Note  ____________________________________________  2000     I have reviewed the triage vital signs and the nursing notes.   HISTORY  Chief Complaint Post-op Problem   HPI [name] is a 61 y.o. female who presents to the emergency department for evaluation of pain and concern of infection over the recently inserted hero graft that was placed on 05/22/2017 by Dr. Ronalee Belts. The husband states that he had called the office this afternoon and was advised to bring her to the emergency department for evaluation. He states that he had noticed some drainage from the site on the left arm and today noticed that there was some redness and that the skin was hard in the area. He also states that due to taking oxycodone for pain, she has been unable to have a bowel movement for the past 3 days. He had been giving her stool softeners without any relief. Due to constipation, he has only given her Tylenol today. She has not missed any of her dialysis days.   Past Medical History:  Diagnosis Date  . Anemia   . Anxiety   . Arthritis   . Cancer [facility])    Left Kidney Cancer  . CHF (congestive heart failure) ([place])   . Chronic kidney disease   . COPD (chronic obstructive pulmonary disease) (H[facility])    Use Oxygen at bedtime  . Coronary artery disease   . Diabetes mellitus without complication ([place])   . Dialysis patient ([place])    Tues, Thurs, Sat  . Dyspnea    with exertion  . GERD (gastroesophageal reflux disease)   . Gout   . History of kidney stones   . Hypertension   . Neuropathy   . Peripheral vascular disease ([place])   . Sleep apnea    OSA--USE C-PAP  . Stroke [facility]) 2004    Patient Active Problem List   Diagnosis Date Noted  . 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 Complication of renal dialysis device 05/06/2017  . Abnormal stress test   . Cerebrovascular accident, old 06/21/2014  . CAFL (chronic airflow limitation) ([place])  04/09/2014  . Gout 02/16/2014  . BP (high blood pressure) 02/16/2014  . Diabetes ([place]) 09/28/2013  . Dermatophytoses 09/28/2013  . End stage kidney disease ([place]) 09/28/2013  . Biliary calculi 05/03/2011  . Idiopathic localized osteoarthropathy 02/12/2011    Past Surgical History:  Procedure Laterality Date  . A/V SHUNTOGRAM Left 04/26/2017   Procedure: A/V Fistulagram;  Surgeon: Katha Cabal, MD;  Location: [place] CV [facility];  Service: Cardiovascular;  Laterality: Left;  . ABDOMINAL HYSTERECTOMY    . CARDIAC CATHETERIZATION    . DIALYSIS/PERMA CATHETER INSERTION N/A 01/31/2017   Procedure: Dialysis/Perma Catheter Insertion;  Surgeon: Algernon Huxley, MD;  Location: [place] [facility];  Service: Cardiovascular;  Laterality: N/A;  . DIALYSIS/PERMA CATHETER INSERTION N/A 02/21/2017   Procedure: Dialysis/Perma Catheter Insertion;  Surgeon: Algernon Huxley, MD;  Location: [place] [facility];  Service: Cardiovascular;  Laterality: N/A;  . DIALYSIS/PERMA CATHETER INSERTION N/A 03/19/2017   Procedure: Dialysis/Perma Catheter Insertion;  Surgeon: Katha Cabal, MD;  Location: [place] [facility];  Service: Cardiovascular;  Laterality: N/A;  . EYE SURGERY Bilateral    Cataract Extraction with IOL  . KIDNEY SURGERY Left    Partial Nephrectomy  . LEFT HEART CATH AND CORONARY ANGIOGRAPHY N/A 04/05/2017   Procedure: Left Heart Cath and Coronary Angiography;  Surgeon: Wellington Hampshire, MD;  Location:  [place] [facility];  Service: Cardiovascular;  Laterality: N/A;  . PERIPHERAL VASCULAR CATHETERIZATION N/A 01/23/2016   Procedure: Dialysis/Perma Catheter Insertion;  Surgeon: Algernon Huxley, MD;  Location: [place] [facility];  Service: Cardiovascular;  Laterality: N/A;  . PERIPHERAL VASCULAR CATHETERIZATION Left 10/01/2016   Procedure: A/V Shuntogram/Fistulagram;  Surgeon: Algernon Huxley, MD;  Location: [place] [facility];  Service: Cardiovascular;  Laterality: Left;  . UPPER EXTREMITY VENOGRAPHY  Bilateral 04/26/2017   Procedure: Upper Extremity Venography;  Surgeon: Katha Cabal, MD;  Location: [place] [facility];  Service: Cardiovascular;  Laterality: Bilateral;  . VASCULAR ACCESS DEVICE INSERTION Left 05/22/2017   Procedure: INSERTION OF HERO VASCULAR ACCESS DEVICE;  Surgeon: Katha Cabal, MD;  Location: [facility] ORS;  Service: Vascular;  Laterality: Left;    Prior to Admission medications   Medication Sig Start Date End Date Taking? Authorizing Provider  acetaminophen (TYLENOL 8 HOUR) 650 MG CR tablet Take 650 mg by mouth every 8 (eight) hours as needed for pain.    [provider]  albuterol (PROVENTIL HFA;VENTOLIN HFA) 108 (90 Base) MCG/ACT inhaler Inhale 2 puffs into the lungs every 6 (six) hours as needed for wheezing or shortness of breath.    [provider]  allopurinol (ZYLOPRIM) 100 MG tablet Take 150 mg by mouth Every Tuesday,Thursday,and Saturday with dialysis.     [provider]  amitriptyline (ELAVIL) 25 MG tablet Take 25 mg by mouth at bedtime.  02/16/14   [provider]  amLODipine (NORVASC) 10 MG tablet Take 10 mg by mouth daily.    [provider]  atorvastatin (LIPITOR) 40 MG tablet Take 40 mg by mouth daily at 12 noon. 09/07/16   [provider]  calcium carbonate (TUMS - DOSED IN MG ELEMENTAL CALCIUM) 500 MG chewable tablet Chew 1 tablet by mouth as needed for indigestion or heartburn.    [provider]  carboxymethylcellulose (REFRESH PLUS) 0.5 % SOLN Place 1-2 drops into both eyes 3 (three) times daily as needed (for dry eyes).    [provider]  citalopram (CELEXA) 40 MG tablet Take 40 mg by mouth daily.  02/16/14   [provider]  clopidogrel (PLAVIX) 75 MG tablet Take 75 mg by mouth daily.  02/16/14   [provider]  diclofenac sodium (VOLTAREN) 1 % GEL Apply 1 g topically 2 (two) times daily as needed (for pain.).    [provider]  diphenhydrAMINE  (BENADRYL) 25 MG tablet Take 2 tablets (50 mg total) by mouth as directed. Take Friday morning 04/05/17 before procedure. Patient taking differently: Take 50 mg by mouth as directed. Take Wednesday morning 6/20 prior to coming to hospital 04/02/17   Wende Bushy, MD  docusate sodium (STOOL SOFTENER) 100 MG capsule Take 200 mg by mouth daily.  02/16/14   [provider]  fluticasone (FLONASE) 50 MCG/ACT nasal spray Place 2 sprays into both nostrils daily.  02/16/14   [provider]  GNP ALLERGY 25 MG tablet Take 25 mg by mouth at bedtime.  09/26/16   [provider]  hydroxypropyl methylcellulose / hypromellose (ISOPTO TEARS / GONIOVISC) 2.5 % ophthalmic solution Place 1-2 drops into both eyes 4 (four) times daily as needed for dry eyes.    [provider]  insulin glargine (LANTUS) 100 UNIT/ML injection Inject 15 Units into the skin daily.  02/16/14   [provider]  lidocaine-prilocaine (EMLA) cream Apply 1 application topically as needed (prior to dialysis).  [provider]  loratadine (CLARITIN) 10 MG tablet Take 10 mg by mouth at bedtime.    [provider]  Misc. Devices [facility]) MISC Frequency:PHARMDIR   Dosage:0.0     Instructions:  Note:Dx: Deconditioning with orthostatic hypotension Dose: 1 07/02/12   [provider]  nystatin (MYCOSTATIN/NYSTOP) powder Apply 1 g topically daily as needed (for skin rashes.).  06/26/16   [provider]  omega-3 acid ethyl esters (LOVAZA) 1 g capsule Take 1 g by mouth 2 (two) times daily. NOON & BEDTIME    [provider]  oxyCODONE-acetaminophen (ROXICET) 5-325 MG tablet Take 1-2 tablets by mouth every 6 (six) hours as needed for moderate pain or severe pain. 05/22/17   Schnier, Dolores Lory, MD  OXYGEN Inhale 2 L into the lungs at bedtime.    [provider]  polyethylene glycol powder (GLYCOLAX/MIRALAX) powder Take 17 g by mouth daily. With coffee 06/12/14    [provider]  predniSONE (DELTASONE) 50 MG tablet Take 1 tablet 04/04/17 at 6PM, Take 1 tablet 04/04/17 at bedtime, and take 1 tablet 04/05/17 in AM Patient not taking: Reported on 05/13/2017 04/02/17   Wende Bushy, MD  ranitidine (ZANTAC) 150 MG tablet Take 150 mg by mouth daily.  09/26/16   [provider]  RENVELA 800 MG tablet Take 800 mg by mouth 3 (three) times daily with meals.  04/16/17   [provider]  sulfamethoxazole-trimethoprim (BACTRIM DS,SEPTRA DS) 800-160 MG tablet Take 1 tablet by mouth 2 (two) times daily. 05/27/17   Triplett, Cari B, FNP  VICTOZA 18 MG/3ML SOPN Inject 0.3 mL (1.8 mg total) under the skin daily. 04/11/17   [provider]    Allergies Iodine; Enalapril; and Iodinated diagnostic agents  Family History  Problem Relation Age of Onset  . Hypertension Mother   . Heart failure Mother   . Diabetes Father   . Heart attack Father   . Lung cancer Maternal Aunt   . Cancer Maternal Aunt     Social History Social History  Substance Use Topics  . Smoking status: Former Research scientist (life sciences)  . Smokeless tobacco: Never Used  . Alcohol use No    Review of Systems  Constitutional: No fever/chills Eyes: No visual changes. ENT: No dysphagia. Cardiovascular: Denies chest pain. Respiratory: Denies shortness of breath. Gastrointestinal: No abdominal pain.  No nausea, no vomiting.  No diarrhea.  Positive for constipation. Genitourinary: Negative for dysuria. Musculoskeletal: Negative for back pain. Skin: Negative for rash. Neurological: Negative for headaches, focal weakness or numbness. ____________________________________________   PHYSICAL EXAM:  VITAL SIGNS: ED Triage Vitals  Enc Vitals Group     BP 05/27/17 1802 (!) 143/52     Pulse Rate 05/27/17 1802 88     Resp 05/27/17 1802 18     Temp 05/27/17 1802 98.8 F (37.1 C)     Temp Source 05/27/17 1802 Oral     SpO2 05/27/17 1802 95 %     Weight 05/27/17 1804 284 lb (128.8 kg)      Height 05/27/17 1804 5\' 6"  (1.676 m)     Head Circumference --      Peak Flow --      Pain Score 05/27/17 1810 10     Pain Loc --      Pain Edu? --      Excl. in [place]? --     Constitutional: Alert and oriented. Uncomfortable appearing. Eyes: Conjunctivae are normal. Head: Atraumatic. Nose: No congestion/rhinnorhea. Mouth/Throat: Mucous membranes are moist.  Oropharynx non-erythematous. Neck: No stridor.   Cardiovascular: Normal rate, regular rhythm. Grossly normal heart sounds. Good peripheral circulation. Good pulsatile flow over the graft in the left arm. Respiratory: Normal respiratory effort.  No retractions. Lungs CTAB. Gastrointestinal: Soft and nontender. No distention.  Musculoskeletal: No lower extremity tenderness nor edema.  No joint effusions. Neurologic:  Normal speech and language. No gross focal neurologic deficits are appreciated. No gait instability. Skin:  Skin over the surgical incision in the left upper arm with localized induration and mild erythema. Serosanguineous drainage noted from the staples which remain intact and the skin appears well approximated. No purulent drainage is noted from the incision site. Dressing over the left jugular site as well as site above the left axilla appears clean and dry without any indication of drainage, erythema, or surrounding induration.  Psychiatric: Mood and affect are normal. Speech and behavior are normal.  ____________________________________________   LABS (all labs ordered are listed, but only abnormal results are displayed)  Labs Reviewed  CBC WITH DIFFERENTIAL/PLATELET - Abnormal; Notable for the following:       Result Value   RBC 3.50 (*)    Hemoglobin 10.4 (*)    HCT 31.2 (*)    RDW 16.3 (*)    All other components within normal limits  COMPREHENSIVE METABOLIC PANEL - Abnormal; Notable for the following:    Sodium 132 (*)    Chloride 94 (*)    Glucose, Bld 122 (*)    BUN 45 (*)    Creatinine, Ser 9.58 (*)      Calcium 8.3 (*)    Albumin 3.0 (*)    AST 11 (*)    ALT 6 (*)    GFR calc non Af Amer 4 (*)    GFR calc Af Amer 4 (*)    All other components within normal limits  CULTURE, BLOOD (ROUTINE X 2)  CULTURE, BLOOD (ROUTINE X 2)  LACTIC ACID, PLASMA   ____________________________________________  EKG  Not indicated. ____________________________________________  RADIOLOGY  No results found.  ____________________________________________   PROCEDURES  Procedure(s) performed: None  Procedures  Critical Care performed: No  ____________________________________________   INITIAL IMPRESSION / ASSESSMENT AND PLAN / ED COURSE  Pertinent labs & imaging results that were available during my care of the patient were reviewed by me and considered in my medical decision making (see chart for details).  61 year old female presenting to the emergency department for evaluation and treatment of symptoms consistent with cellulitis after insertion of a HERO graft on 05/22/2017. Plan will be to obtain blood cultures, give vancomycin, and discuss the plan with Dr. Ronalee Belts.  8:23 PM Case discussed with Dr. Hinton Lovely who agrees with plan to give her vancomycin while in the emergency department, control her pain, and have her follow-up in the office tomorrow.  ----------------------------------------- 9:21 PM on 05/27/2017 -----------------------------------------  Awaiting lab to come for second blood culture prior to starting the vancomycin. Patient reports that her pain is significantly less and was resting with her eyes closed upon entering the room.  ----------------------------------------- 11:36 PM on 05/27/2017 -----------------------------------------  Patient has been discharged home. She received approximately 500 mg of vancomycin. Small infiltration noted at the IV site which was treated with compression and ice. The patient refused any additional IV sticks. She will be given a  prescription for Bactrim and will call and schedule a follow-up appointment with Dr. Delana Meyer tomorrow. Husband and patient agrees to schedule the appointment.  __________________________________________   FINAL CLINICAL IMPRESSION(S) / ED  DIAGNOSES  Final diagnoses:  Cellulitis of left upper extremity      NEW MEDICATIONS STARTED DURING THIS VISIT:  Discharge Medication List as of 05/27/2017 10:56 PM    START taking these medications   Details  sulfamethoxazole-trimethoprim (BACTRIM DS,SEPTRA DS) 800-160 MG tablet Take 1 tablet by mouth 2 (two) times daily., Starting Mon 05/27/2017, Print         Note:  This document was prepared using Dragon voice recognition software and may include unintentional dictation errors.    Victorino Dike, FNP 05/27/17 2345    Nance Pear, MD 05/28/17 [phone]

## 2017-05-27 NOTE — Discharge Instructions (Signed)
Call the office first thing in the morning for a follow up appointment.

## 2017-05-27 NOTE — Telephone Encounter (Signed)
Patient has been advised to go to the ER

## 2017-05-27 NOTE — Progress Notes (Signed)
Phone call received from patients' husband who reports patient is having some increased "swelling with yellow/green discharge "from operative site. I instructed patient to call Dr. Nino Parsley office, which he replied has already been completed with a voice mail message.  I further encouraged the patient to await a return call from the office.  Also, instructed her husband if he felt like she needed immediate assistance to have the patient go to the emergency department.   This RN also made a phone call to Mickel Baas at the [place] to alert them to this conversation.

## 2017-05-28 ENCOUNTER — Encounter (INDEPENDENT_AMBULATORY_CARE_PROVIDER_SITE_OTHER): Payer: Self-pay | Admitting: Vascular Surgery

## 2017-05-28 ENCOUNTER — Ambulatory Visit (INDEPENDENT_AMBULATORY_CARE_PROVIDER_SITE_OTHER): Payer: Medicare Other | Admitting: Vascular Surgery

## 2017-05-28 VITALS — BP 132/65 | HR 94 | Resp 16 | Ht 67.0 in

## 2017-05-28 DIAGNOSIS — N186 End stage renal disease: Secondary | ICD-10-CM

## 2017-05-28 DIAGNOSIS — Z992 Dependence on renal dialysis: Secondary | ICD-10-CM | POA: Diagnosis not present

## 2017-05-28 DIAGNOSIS — I2510 Atherosclerotic heart disease of native coronary artery without angina pectoris: Secondary | ICD-10-CM

## 2017-05-28 DIAGNOSIS — I10 Essential (primary) hypertension: Secondary | ICD-10-CM | POA: Diagnosis not present

## 2017-05-28 DIAGNOSIS — E1122 Type 2 diabetes mellitus with diabetic chronic kidney disease: Secondary | ICD-10-CM

## 2017-05-28 NOTE — Anesthesia Postprocedure Evaluation (Signed)
Anesthesia Post Note  Patient: [name]  Procedure(s) Performed: Procedure(s) (LRB): INSERTION OF HERO VASCULAR ACCESS DEVICE (Left)  Patient location during evaluation: PACU Anesthesia Type: General Level of consciousness: awake Pain management: pain level controlled Vital Signs Assessment: post-procedure vital signs reviewed and stable Respiratory status: spontaneous breathing Cardiovascular status: stable Anesthetic complications: no     Last Vitals:  Vitals:   05/22/17 1615 05/22/17 1645  BP: 123/65 103/71  Pulse: 81 82  Resp:    Temp:      Last Pain:  Vitals:   05/23/17 0841  TempSrc:   PainSc: 0-No pain                 VAN STAVEREN,GIJSBERTUS

## 2017-05-28 NOTE — Progress Notes (Signed)
Subjective:    Patient ID: [name], female    DOB: December 19, 1955, 61 y.o.   MRN: [id number] Chief Complaint  Patient presents with  . Re-evaluation    May have infection   Patient presents for her first postoperative follow-up. Patient is status post a hero graft insertion (left) on 05/22/2017. Her postoperative course has been unremarkable with the exception of her husband concerned for infection. Husband states the patient's arm was red and draining a yellow fluid. She was seen in the ED yesterday given a dose of vancomycin and started on PO Bactrim. The patient still has her OR dressing in place. She denies any fever, nausea or vomiting. Patient denies any left hand pain.   Review of Systems  Constitutional: Negative.   HENT: Negative.   Eyes: Negative.   Respiratory: Negative.   Cardiovascular: Negative.   Gastrointestinal: Negative.   Endocrine: Negative.   Genitourinary: Negative.   Musculoskeletal: Negative.   Skin:       Incision  Allergic/Immunologic: Negative.   Neurological: Negative.   Hematological: Negative.   Psychiatric/Behavioral: Negative.       Objective:   Physical Exam  Constitutional: She is oriented to person, place, and time. She appears well-developed and well-nourished. No distress.  HENT:  Head: Normocephalic and atraumatic.  Eyes: Conjunctivae are normal. Pupils are equal, round, and reactive to light.  Neck: Normal range of motion.  Cardiovascular: Normal rate, regular rhythm, normal heart sounds and intact distal pulses.   Pulses:      Radial pulses are 2+ on the right side, and 2+ on the left side.  Pulmonary/Chest: Effort normal.  Musculoskeletal: Normal range of motion. She exhibits no edema.  Neurological: She is alert and oriented to person, place, and time.  Skin: She is not diaphoretic.     Left upper extremity: OR dressings removed. Staples intact. There is no active drainage. No odor. Distal incision with what feels like a  small hematoma located laterally with some erythema.   Psychiatric: She has a normal mood and affect. Her behavior is normal. Judgment and thought content normal.  Vitals reviewed.   BP 132/65 (BP Location: Right Arm)   Pulse 94   Resp 16   Ht 5\' 7"  (1.702 m)   BMI 44.48 kg/m   Past Medical History:  Diagnosis Date  . Anemia   . Anxiety   . Arthritis   . Cancer [facility])    Left Kidney Cancer  . CHF (congestive heart failure) ([place])   . Chronic kidney disease   . COPD (chronic obstructive pulmonary disease) (HCC)    Use Oxygen at bedtime  . Coronary artery disease   . Diabetes mellitus without complication ([place])   . Dialysis patient ([place])    Tues, Thurs, Sat  . Dyspnea    with exertion  . GERD (gastroesophageal reflux disease)   . Gout   . History of kidney stones   . Hypertension   . Neuropathy   . Peripheral vascular disease ([place])   . Sleep apnea    OSA--USE C-PAP  . Stroke [facility]) 2004    Social History   Social History  . Marital status: Married    Spouse name: N/A  . Number of children: N/A  . Years of education: N/A   Occupational History  . Not on file.   Social History Main Topics  . Smoking status: Former Research scientist (life sciences)  . Smokeless tobacco: Never Used  . Alcohol use No  . Drug use:  No  . Sexual activity: No   Other Topics Concern  . Not on file   Social History Narrative  . No narrative on file    Past Surgical History:  Procedure Laterality Date  . A/V SHUNTOGRAM Left 04/26/2017   Procedure: A/V Fistulagram;  Surgeon: Katha Cabal, MD;  Location: [place] [facility];  Service: Cardiovascular;  Laterality: Left;  . ABDOMINAL HYSTERECTOMY    . CARDIAC CATHETERIZATION    . DIALYSIS/PERMA CATHETER INSERTION N/A 01/31/2017   Procedure: Dialysis/Perma Catheter Insertion;  Surgeon: Algernon Huxley, MD;  Location: [place] [facility];  Service: Cardiovascular;  Laterality: N/A;  . DIALYSIS/PERMA CATHETER INSERTION N/A 02/21/2017   Procedure:  Dialysis/Perma Catheter Insertion;  Surgeon: Algernon Huxley, MD;  Location: [place] [facility];  Service: Cardiovascular;  Laterality: N/A;  . DIALYSIS/PERMA CATHETER INSERTION N/A 03/19/2017   Procedure: Dialysis/Perma Catheter Insertion;  Surgeon: Katha Cabal, MD;  Location: [place] [facility];  Service: Cardiovascular;  Laterality: N/A;  . EYE SURGERY Bilateral    Cataract Extraction with IOL  . KIDNEY SURGERY Left    Partial Nephrectomy  . LEFT HEART CATH AND CORONARY ANGIOGRAPHY N/A 04/05/2017   Procedure: Left Heart Cath and Coronary Angiography;  Surgeon: Wellington Hampshire, MD;  Location: [place] [facility];  Service: Cardiovascular;  Laterality: N/A;  . PERIPHERAL VASCULAR CATHETERIZATION N/A 01/23/2016   Procedure: Dialysis/Perma Catheter Insertion;  Surgeon: Algernon Huxley, MD;  Location: [place] [facility];  Service: Cardiovascular;  Laterality: N/A;  . PERIPHERAL VASCULAR CATHETERIZATION Left 10/01/2016   Procedure: A/V Shuntogram/Fistulagram;  Surgeon: Algernon Huxley, MD;  Location: [place] [facility];  Service: Cardiovascular;  Laterality: Left;  . UPPER EXTREMITY VENOGRAPHY Bilateral 04/26/2017   Procedure: Upper Extremity Venography;  Surgeon: Katha Cabal, MD;  Location: [place] [facility];  Service: Cardiovascular;  Laterality: Bilateral;  . VASCULAR ACCESS DEVICE INSERTION Left 05/22/2017   Procedure: INSERTION OF HERO VASCULAR ACCESS DEVICE;  Surgeon: Katha Cabal, MD;  Location: [facility];  Service: Vascular;  Laterality: Left;    Family History  Problem Relation Age of Onset  . Hypertension Mother   . Heart failure Mother   . Diabetes Father   . Heart attack Father   . Lung cancer Maternal Aunt   . Cancer Maternal Aunt     Allergies  Allergen Reactions  . Iodine Rash  . Enalapril Other (See Comments)    TONGUE SWELLS/GOUT  . Iodinated Diagnostic Agents Other (See Comments)    BREAK OUT IN WHELPS         Assessment & Plan:  Patient presents  for her first postoperative follow-up. Patient is status post a hero graft insertion (left) on 05/22/2017. Her postoperative course has been unremarkable with the exception of her husband concerned for infection. Husband states the patient's arm was red and draining a yellow fluid. She was seen in the ED yesterday given a dose of vancomycin and started on PO Bactrim. The patient still has her OR dressing in place. She denies any fever, nausea or vomiting. Patient denies any left hand pain.  1. End stage kidney disease ([place]) - stable Patient with left upper extremity hero graft placement on 05/22/2017. OR dressings removed in office There does not seem to be an infection however I believe there is a small hematoma noted laterally with some erythema Patient to continue Bactrim and follow up with [place] in 1 week for a wound check Patient and her  husband understand if she experiences any worsening erythema, pain, nausea, vomiting or fever she should call the office immediately  2. Type 2 diabetes mellitus with chronic kidney disease on chronic dialysis, unspecified whether Iwanicki term insulin use (HCC) - stable Encouraged good control as its slows the progression of atherosclerotic disease  3. Essential hypertension - stable Encouraged good control as its slows the progression of atherosclerotic disease  Current Outpatient Prescriptions on File Prior to Visit  Medication Sig Dispense Refill  . acetaminophen (TYLENOL 8 HOUR) 650 MG CR tablet Take 650 mg by mouth every 8 (eight) hours as needed for pain.    Marland Kitchen albuterol (PROVENTIL HFA;VENTOLIN HFA) 108 (90 Base) MCG/ACT inhaler Inhale 2 puffs into the lungs every 6 (six) hours as needed for wheezing or shortness of breath.    . allopurinol (ZYLOPRIM) 100 MG tablet Take 150 mg by mouth Every Tuesday,Thursday,and Saturday with dialysis.     Marland Kitchen amitriptyline (ELAVIL) 25 MG tablet Take 25 mg by mouth at bedtime.     Marland Kitchen amLODipine (NORVASC) 10 MG tablet Take 10 mg  by mouth daily.    Marland Kitchen atorvastatin (LIPITOR) 40 MG tablet Take 40 mg by mouth daily at 12 noon.    . calcium carbonate (TUMS - DOSED IN MG ELEMENTAL CALCIUM) 500 MG chewable tablet Chew 1 tablet by mouth as needed for indigestion or heartburn.    . carboxymethylcellulose (REFRESH PLUS) 0.5 % SOLN Place 1-2 drops into both eyes 3 (three) times daily as needed (for dry eyes).    . citalopram (CELEXA) 40 MG tablet Take 40 mg by mouth daily.     . clopidogrel (PLAVIX) 75 MG tablet Take 75 mg by mouth daily.     . diclofenac sodium (VOLTAREN) 1 % GEL Apply 1 g topically 2 (two) times daily as needed (for pain.).    Marland Kitchen diphenhydrAMINE (BENADRYL) 25 MG tablet Take 2 tablets (50 mg total) by mouth as directed. Take Friday morning 04/05/17 before procedure. (Patient taking differently: Take 50 mg by mouth as directed. Take Wednesday morning 6/20 prior to coming to hospital) 2 tablet 0  . docusate sodium (STOOL SOFTENER) 100 MG capsule Take 200 mg by mouth daily.     . fluticasone (FLONASE) 50 MCG/ACT nasal spray Place 2 sprays into both nostrils daily.     . GNP ALLERGY 25 MG tablet Take 25 mg by mouth at bedtime.   0  . hydroxypropyl methylcellulose / hypromellose (ISOPTO TEARS / GONIOVISC) 2.5 % ophthalmic solution Place 1-2 drops into both eyes 4 (four) times daily as needed for dry eyes.    . insulin glargine (LANTUS) 100 UNIT/ML injection Inject 15 Units into the skin daily.     Marland Kitchen lidocaine-prilocaine (EMLA) cream Apply 1 application topically as needed (prior to dialysis).    Marland Kitchen loratadine (CLARITIN) 10 MG tablet Take 10 mg by mouth at bedtime.    . Misc. Devices [facility]) MISC Frequency:PHARMDIR   Dosage:0.0     Instructions:  Note:Dx: Deconditioning with orthostatic hypotension Dose: 1    . nystatin (MYCOSTATIN/NYSTOP) powder Apply 1 g topically daily as needed (for skin rashes.).     Marland Kitchen omega-3 acid ethyl esters (LOVAZA) 1 g capsule Take 1 g by mouth 2 (two) times daily. NOON & BEDTIME    .  oxyCODONE-acetaminophen (ROXICET) 5-325 MG tablet Take 1-2 tablets by mouth every 6 (six) hours as needed for moderate pain or severe pain. 40 tablet 0  . OXYGEN Inhale 2 L into the lungs  at bedtime.    . polyethylene glycol powder (GLYCOLAX/MIRALAX) powder Take 17 g by mouth daily. With coffee    . predniSONE (DELTASONE) 50 MG tablet Take 1 tablet 04/04/17 at 6PM, Take 1 tablet 04/04/17 at bedtime, and take 1 tablet 04/05/17 in AM (Patient not taking: Reported on 05/13/2017) 3 tablet 0  . ranitidine (ZANTAC) 150 MG tablet Take 150 mg by mouth daily.   0  . RENVELA 800 MG tablet Take 800 mg by mouth 3 (three) times daily with meals.     Marland Kitchen sulfamethoxazole-trimethoprim (BACTRIM DS,SEPTRA DS) 800-160 MG tablet Take 1 tablet by mouth 2 (two) times daily. 20 tablet 0  . VICTOZA 18 MG/3ML SOPN Inject 0.3 mL (1.8 mg total) under the skin daily.  12   No current facility-administered medications on file prior to visit.     There are no Patient Instructions on file for this visit. No Follow-up on file.   KIMBERLY A STEGMAYER, PA-C

## 2017-06-01 LAB — CULTURE, BLOOD (ROUTINE X 2)
Culture: NO GROWTH
Culture: NO GROWTH
Special Requests: ADEQUATE

## 2017-06-10 ENCOUNTER — Ambulatory Visit (INDEPENDENT_AMBULATORY_CARE_PROVIDER_SITE_OTHER): Payer: Medicare Other | Admitting: Vascular Surgery

## 2017-06-10 ENCOUNTER — Encounter (INDEPENDENT_AMBULATORY_CARE_PROVIDER_SITE_OTHER): Payer: Self-pay | Admitting: Vascular Surgery

## 2017-06-10 VITALS — BP 133/62 | HR 99 | Resp 16 | Wt 285.0 lb

## 2017-06-10 DIAGNOSIS — N186 End stage renal disease: Secondary | ICD-10-CM

## 2017-06-10 DIAGNOSIS — T829XXS Unspecified complication of cardiac and vascular prosthetic device, implant and graft, sequela: Secondary | ICD-10-CM

## 2017-06-10 NOTE — Progress Notes (Signed)
Patient ID: [name], female   DOB: 1956-01-15, 61 y.o.   MRN: [id number]  Chief Complaint  Patient presents with  . Follow-up    HPI [name] is a 61 y.o. female.  The patient returns to the office for follow up of her left arm HERO creation on 05/22/2017.  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  She denies pain no fever overall she states she feels great.  Past Medical History:  Diagnosis Date  . Anemia   . Anxiety   . Arthritis   . Cancer [facility])    Left Kidney Cancer  . CHF (congestive heart failure) ([place])   . Chronic kidney disease   . COPD (chronic obstructive pulmonary disease) (HCC)    Use Oxygen at bedtime  . Coronary artery disease   . Diabetes mellitus without complication ([place])   . Dialysis patient ([place])    Tues, Thurs, Sat  . Dyspnea    with exertion  . GERD (gastroesophageal reflux disease)   . Gout   . History of kidney stones   . Hypertension   . Neuropathy   . Peripheral vascular disease ([place])   . Sleep apnea    OSA--USE C-PAP  . Stroke [facility]) 2004    Past Surgical History:  Procedure Laterality Date  . A/V SHUNTOGRAM Left 04/26/2017   Procedure: A/V Fistulagram;  Surgeon: Katha Cabal, MD;  Location: [place] [facility];  Service: Cardiovascular;  Laterality: Left;  . ABDOMINAL HYSTERECTOMY    . CARDIAC CATHETERIZATION    . DIALYSIS/PERMA CATHETER INSERTION N/A 01/31/2017   Procedure: Dialysis/Perma Catheter Insertion;  Surgeon: Algernon Huxley, MD;  Location: [place] [facility];  Service: Cardiovascular;  Laterality: N/A;  . DIALYSIS/PERMA CATHETER INSERTION N/A 02/21/2017   Procedure: Dialysis/Perma Catheter Insertion;  Surgeon: Algernon Huxley, MD;  Location: [place] [facility];  Service: Cardiovascular;  Laterality: N/A;  . DIALYSIS/PERMA CATHETER INSERTION N/A 03/19/2017   Procedure: Dialysis/Perma Catheter Insertion;  Surgeon: Katha Cabal, MD;  Location: [place] [facility];  Service: Cardiovascular;  Laterality: N/A;  . EYE SURGERY Bilateral    Cataract Extraction with IOL  . KIDNEY SURGERY Left    Partial Nephrectomy  . LEFT HEART CATH AND CORONARY ANGIOGRAPHY N/A 04/05/2017   Procedure: Left Heart Cath and Coronary Angiography;  Surgeon: Wellington Hampshire, MD;  Location: [place] [facility];  Service: Cardiovascular;  Laterality: N/A;  . PERIPHERAL VASCULAR CATHETERIZATION N/A 01/23/2016   Procedure: Dialysis/Perma Catheter Insertion;  Surgeon: Algernon Huxley, MD;  Location: [place] [facility];  Service: Cardiovascular;  Laterality: N/A;  . PERIPHERAL VASCULAR CATHETERIZATION Left 10/01/2016   Procedure: A/V Shuntogram/Fistulagram;  Surgeon: Algernon Huxley, MD;  Location: [place] [facility];  Service: Cardiovascular;  Laterality: Left;  . UPPER EXTREMITY VENOGRAPHY Bilateral 04/26/2017   Procedure: Upper Extremity Venography;  Surgeon: Katha Cabal, MD;  Location: [place] [facility];  Service: Cardiovascular;  Laterality: Bilateral;  . VASCULAR ACCESS DEVICE INSERTION Left 05/22/2017   Procedure: INSERTION OF HERO VASCULAR ACCESS DEVICE;  Surgeon: Katha Cabal, MD;  Location: [facility];  Service: Vascular;  Laterality: Left;      Allergies  Allergen Reactions  . Iodine Rash  . Enalapril Other (See Comments)    TONGUE SWELLS/GOUT  . Iodinated Diagnostic Agents Other (See Comments)    BREAK OUT IN WHELPS  Current Outpatient Prescriptions  Medication Sig Dispense Refill  . acetaminophen (TYLENOL 8 HOUR) 650 MG CR tablet Take 650 mg by mouth every 8 (eight) hours as needed for pain.    Marland Kitchen albuterol (PROVENTIL HFA;VENTOLIN HFA) 108 (90 Base) MCG/ACT inhaler Inhale 2 puffs into the lungs every 6 (six) hours as needed for wheezing or shortness of breath.    . allopurinol (ZYLOPRIM) 100 MG tablet Take 150 mg by mouth Every Tuesday,Thursday,and Saturday with dialysis.     Marland Kitchen amitriptyline (ELAVIL) 25 MG  tablet Take 25 mg by mouth at bedtime.     Marland Kitchen amLODipine (NORVASC) 10 MG tablet Take 10 mg by mouth daily.    Marland Kitchen atorvastatin (LIPITOR) 40 MG tablet Take 40 mg by mouth daily at 12 noon.    . calcium carbonate (TUMS - DOSED IN MG ELEMENTAL CALCIUM) 500 MG chewable tablet Chew 1 tablet by mouth as needed for indigestion or heartburn.    . carboxymethylcellulose (REFRESH PLUS) 0.5 % SOLN Place 1-2 drops into both eyes 3 (three) times daily as needed (for dry eyes).    . citalopram (CELEXA) 40 MG tablet Take 40 mg by mouth daily.     . clopidogrel (PLAVIX) 75 MG tablet Take 75 mg by mouth daily.     . diclofenac sodium (VOLTAREN) 1 % GEL Apply 1 g topically 2 (two) times daily as needed (for pain.).    Marland Kitchen diphenhydrAMINE (BENADRYL) 25 MG tablet Take 2 tablets (50 mg total) by mouth as directed. Take Friday morning 04/05/17 before procedure. (Patient taking differently: Take 50 mg by mouth as directed. Take Wednesday morning 6/20 prior to coming to hospital) 2 tablet 0  . docusate sodium (STOOL SOFTENER) 100 MG capsule Take 200 mg by mouth daily.     . fluticasone (FLONASE) 50 MCG/ACT nasal spray Place 2 sprays into both nostrils daily.     . GNP ALLERGY 25 MG tablet Take 25 mg by mouth at bedtime.   0  . hydroxypropyl methylcellulose / hypromellose (ISOPTO TEARS / GONIOVISC) 2.5 % ophthalmic solution Place 1-2 drops into both eyes 4 (four) times daily as needed for dry eyes.    . insulin glargine (LANTUS) 100 UNIT/ML injection Inject 15 Units into the skin daily.     Marland Kitchen lidocaine-prilocaine (EMLA) cream Apply 1 application topically as needed (prior to dialysis).    Marland Kitchen loratadine (CLARITIN) 10 MG tablet Take 10 mg by mouth at bedtime.    . Misc. Devices [facility]) MISC Frequency:PHARMDIR   Dosage:0.0     Instructions:  Note:Dx: Deconditioning with orthostatic hypotension Dose: 1    . nystatin (MYCOSTATIN/NYSTOP) powder Apply 1 g topically daily as needed (for skin rashes.).     Marland Kitchen omega-3 acid ethyl esters  (LOVAZA) 1 g capsule Take 1 g by mouth 2 (two) times daily. NOON & BEDTIME    . oxyCODONE-acetaminophen (ROXICET) 5-325 MG tablet Take 1-2 tablets by mouth every 6 (six) hours as needed for moderate pain or severe pain. 40 tablet 0  . OXYGEN Inhale 2 L into the lungs at bedtime.    . polyethylene glycol powder (GLYCOLAX/MIRALAX) powder Take 17 g by mouth daily. With coffee    . predniSONE (DELTASONE) 50 MG tablet Take 1 tablet 04/04/17 at 6PM, Take 1 tablet 04/04/17 at bedtime, and take 1 tablet 04/05/17 in AM 3 tablet 0  . ranitidine (ZANTAC) 150 MG tablet Take 150 mg by mouth daily.   0  . RENVELA 800 MG tablet Take 800 mg  by mouth 3 (three) times daily with meals.     Marland Kitchen sulfamethoxazole-trimethoprim (BACTRIM DS,SEPTRA DS) 800-160 MG tablet Take 1 tablet by mouth 2 (two) times daily. 20 tablet 0  . VICTOZA 18 MG/3ML SOPN Inject 0.3 mL (1.8 mg total) under the skin daily.  12   No current facility-administered medications for this visit.         Physical Exam BP 133/62   Pulse 99   Resp 16   Wt 129.3 kg (285 lb)   BMI 44.64 kg/m  Gen:  WD/WN, NAD Skin: incisions C/D/I, no evidence of breakdown or infection.  Staples are removed. HERO graft left arm  Good thrill good bruit    Assessment/Plan:  AV access the patient's arm and access is doing just fine.  I will wait two weeks and reevaluate at which time I am hoping we can begin cannulation     [name] 06/10/2017, 1:00 PM   This note was created with Dragon medical transcription system.  Any errors from dictation are unintentional.

## 2017-06-24 ENCOUNTER — Encounter (INDEPENDENT_AMBULATORY_CARE_PROVIDER_SITE_OTHER): Payer: Self-pay | Admitting: Vascular Surgery

## 2017-06-24 ENCOUNTER — Ambulatory Visit (INDEPENDENT_AMBULATORY_CARE_PROVIDER_SITE_OTHER): Payer: Medicare Other | Admitting: Vascular Surgery

## 2017-06-24 VITALS — BP 118/59 | HR 97 | Resp 16 | Ht 66.0 in | Wt 284.0 lb

## 2017-06-24 DIAGNOSIS — N186 End stage renal disease: Secondary | ICD-10-CM

## 2017-06-24 DIAGNOSIS — T829XXS Unspecified complication of cardiac and vascular prosthetic device, implant and graft, sequela: Secondary | ICD-10-CM

## 2017-06-24 NOTE — Progress Notes (Signed)
Patient ID: [name], female   DOB: [birth year], 61 y.o.   MRN: [id number]  Chief Complaint  Patient presents with  . Follow-up    HPI [name] is a 61 y.o. female.  Patient returns to the office status post revision of her left arm AV access. She now has a left arm hero graft. Postoperatively she did have some redness and drainage at the incision site and this was treated with a course of doxycycline. This has completely resolved and her incision is now well-healed.  PROCEDURE on 05/22/2017: 1. Ultrasound-guided access to left internal jugular vein with insertion of the intravascular portion of the hero graft via this approach.   2. Left jugular venogram and superior venacavogram. 3. Percutaneous transluminal angioplasty of the left proximal internal jugular and left innominate vein with a 10 mm by 40 cm [place] balloon 4. Left arm HERO graft placement 5.  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 Anemia   . Anxiety   . Arthritis   . Cancer [facility])    Left Kidney Cancer  . CHF (congestive heart failure) ([place])   . Chronic kidney disease   . COPD (chronic obstructive pulmonary disease) ([facility])    Use Oxygen at bedtime  . Coronary artery disease   . Diabetes mellitus without complication ([place])   . Dialysis patient ([place])    Tues, Thurs, Sat  . Dyspnea    with exertion  . GERD (gastroesophageal reflux disease)   . Gout   . History of kidney stones   . Hypertension   . Neuropathy   . Peripheral vascular disease ([place])   . Sleep apnea    OSA--USE C-PAP  . Stroke [facility]) 2004    Past Surgical History:  Procedure Laterality Date  . A/V SHUNTOGRAM Left 04/26/2017   Procedure: A/V Fistulagram;  Surgeon: Katha Cabal, MD;  Location: [place] [facility];  Service: Cardiovascular;  Laterality: Left;  . ABDOMINAL HYSTERECTOMY    . CARDIAC CATHETERIZATION    . DIALYSIS/PERMA CATHETER INSERTION N/A 01/31/2017     Procedure: Dialysis/Perma Catheter Insertion;  Surgeon: Algernon Huxley, MD;  Location: [place] [facility];  Service: Cardiovascular;  Laterality: N/A;  . DIALYSIS/PERMA CATHETER INSERTION N/A 02/21/2017   Procedure: Dialysis/Perma Catheter Insertion;  Surgeon: Algernon Huxley, MD;  Location: [place] [facility];  Service: Cardiovascular;  Laterality: N/A;  . DIALYSIS/PERMA CATHETER INSERTION N/A 03/19/2017   Procedure: Dialysis/Perma Catheter Insertion;  Surgeon: Katha Cabal, MD;  Location: [place] [facility];  Service: Cardiovascular;  Laterality: N/A;  . EYE SURGERY Bilateral    Cataract Extraction with IOL  . KIDNEY SURGERY Left    Partial Nephrectomy  . LEFT HEART CATH AND CORONARY ANGIOGRAPHY N/A 04/05/2017   Procedure: Left Heart Cath and Coronary Angiography;  Surgeon: Wellington Hampshire, MD;  Location: [place] [facility];  Service: Cardiovascular;  Laterality: N/A;  . PERIPHERAL VASCULAR CATHETERIZATION N/A 01/23/2016   Procedure: Dialysis/Perma Catheter Insertion;  Surgeon: Algernon Huxley, MD;  Location: [place] [facility];  Service: Cardiovascular;  Laterality: N/A;  . PERIPHERAL VASCULAR CATHETERIZATION Left 10/01/2016   Procedure: A/V Shuntogram/Fistulagram;  Surgeon: Algernon Huxley, MD;  Location: [place] [facility];  Service: Cardiovascular;  Laterality: Left;  . UPPER EXTREMITY VENOGRAPHY Bilateral 04/26/2017   Procedure: Upper Extremity Venography;  Surgeon: Katha Cabal, MD;  Location: [place] [facility];  Service: Cardiovascular;  Laterality: Bilateral;  . VASCULAR ACCESS DEVICE INSERTION Left 05/22/2017   Procedure: INSERTION OF HERO VASCULAR ACCESS DEVICE;  Surgeon: Katha Cabal, MD;  Location: [facility];  Service: Vascular;  Laterality: Left;      Allergies  Allergen Reactions  . Iodine Rash  . Enalapril Other (See Comments)    TONGUE SWELLS/GOUT  . Iodinated Diagnostic Agents Other (See Comments)    BREAK OUT IN WHELPS      Current Outpatient  Prescriptions  Medication Sig Dispense Refill  . acetaminophen (TYLENOL 8 HOUR) 650 MG CR tablet Take 650 mg by mouth every 8 (eight) hours as needed for pain.    Marland Kitchen albuterol (PROVENTIL HFA;VENTOLIN HFA) 108 (90 Base) MCG/ACT inhaler Inhale 2 puffs into the lungs every 6 (six) hours as needed for wheezing or shortness of breath.    . allopurinol (ZYLOPRIM) 100 MG tablet Take 150 mg by mouth Every Tuesday,Thursday,and Saturday with dialysis.     Marland Kitchen amitriptyline (ELAVIL) 25 MG tablet Take 25 mg by mouth at bedtime.     Marland Kitchen amLODipine (NORVASC) 10 MG tablet Take 10 mg by mouth daily.    Marland Kitchen atorvastatin (LIPITOR) 40 MG tablet Take 40 mg by mouth daily at 12 noon.    . calcium carbonate (TUMS - DOSED IN MG ELEMENTAL CALCIUM) 500 MG chewable tablet Chew 1 tablet by mouth as needed for indigestion or heartburn.    . carboxymethylcellulose (REFRESH PLUS) 0.5 % SOLN Place 1-2 drops into both eyes 3 (three) times daily as needed (for dry eyes).    . citalopram (CELEXA) 40 MG tablet Take 40 mg by mouth daily.     . clopidogrel (PLAVIX) 75 MG tablet Take 75 mg by mouth daily.     . diclofenac sodium (VOLTAREN) 1 % GEL Apply 1 g topically 2 (two) times daily as needed (for pain.).    Marland Kitchen diphenhydrAMINE (BENADRYL) 25 MG tablet Take 2 tablets (50 mg total) by mouth as directed. Take Friday morning 04/05/17 before procedure. (Patient taking differently: Take 50 mg by mouth as directed. Take Wednesday morning 6/20 prior to coming to hospital) 2 tablet 0  . docusate sodium (STOOL SOFTENER) 100 MG capsule Take 200 mg by mouth daily.     . fluticasone (FLONASE) 50 MCG/ACT nasal spray Place 2 sprays into both nostrils daily.     . GNP ALLERGY 25 MG tablet Take 25 mg by mouth at bedtime.   0  . hydroxypropyl methylcellulose / hypromellose (ISOPTO TEARS / GONIOVISC) 2.5 % ophthalmic solution Place 1-2 drops into both eyes 4 (four) times daily as needed for dry eyes.    . insulin glargine (LANTUS) 100 UNIT/ML injection Inject  15 Units into the skin daily.     Marland Kitchen lidocaine-prilocaine (EMLA) cream Apply 1 application topically as needed (prior to dialysis).    Marland Kitchen loratadine (CLARITIN) 10 MG tablet Take 10 mg by mouth at bedtime.    . Misc. Devices [facility]) MISC Frequency:PHARMDIR   Dosage:0.0     Instructions:  Note:Dx: Deconditioning with orthostatic hypotension Dose: 1    . nystatin (MYCOSTATIN/NYSTOP) powder Apply 1 g topically daily as needed (for skin rashes.).     Marland Kitchen omega-3 acid ethyl esters (LOVAZA) 1 g capsule Take 1 g by mouth 2 (two) times daily. NOON & BEDTIME    . OXYGEN Inhale 2 L into the lungs at bedtime.    . polyethylene glycol powder (GLYCOLAX/MIRALAX) powder Take 17 g by mouth daily. With coffee    .  predniSONE (DELTASONE) 50 MG tablet Take 1 tablet 04/04/17 at 6PM, Take 1 tablet 04/04/17 at bedtime, and take 1 tablet 04/05/17 in AM 3 tablet 0  . ranitidine (ZANTAC) 150 MG tablet Take 150 mg by mouth daily.   0  . RENVELA 800 MG tablet Take 800 mg by mouth 3 (three) times daily with meals.     Marland Kitchen sulfamethoxazole-trimethoprim (BACTRIM DS,SEPTRA DS) 800-160 MG tablet Take 1 tablet by mouth 2 (two) times daily. 20 tablet 0  . VICTOZA 18 MG/3ML SOPN Inject 0.3 mL (1.8 mg total) under the skin daily.  12  . oxyCODONE-acetaminophen (ROXICET) 5-325 MG tablet Take 1-2 tablets by mouth every 6 (six) hours as needed for moderate pain or severe pain. (Patient not taking: Reported on 06/24/2017) 40 tablet 0   No current facility-administered medications for this visit.         Physical Exam BP (!) 118/59   Pulse 97   Resp 16   Ht 5\' 6"  (1.676 m)   Wt 284 lb (128.8 kg)   BMI 45.84 kg/m  Gen:  WD/WN, NAD Skin: incision C/D/I Left arm hero graft good thrill good bruit     Assessment/Plan:  1. Complication from renal dialysis device, sequela Recommend:  The patient is doing well and currently has adequate dialysis access. The patient's dialysis center is not reporting any access issues.  It is now  okay to begin accessing hero graft. Once his been utilized for a week without difficulties arrangements will be made for catheter removal  The patient should have a duplex ultrasound of the dialysis access in 6 months. The patient will follow-up with me in the office after each ultrasound    - VAS [place] (AVF,AVG); Future  2. End stage kidney disease [facility]) Continue dialysis as arranged      [name] 06/24/2017, 8:44 AM   This note was created with Dragon medical transcription system.  Any errors from dictation are unintentional.

## 2017-07-15 ENCOUNTER — Telehealth (INDEPENDENT_AMBULATORY_CARE_PROVIDER_SITE_OTHER): Payer: Self-pay | Admitting: Vascular Surgery

## 2017-07-15 NOTE — Telephone Encounter (Signed)
TOMMY Burford CALLED AND WANTED TO KNOW WHEN DR SCHNIER IS GOING TO TAKE THAT THING OUT OF HIS WIFES LEG, HE SAYS IT CANT STAY IN THEIR UNTIL JAN WHEN HER NEXT APPT IS. TOLD HIM I WOULD SEND NOTE BACK TO NURSES AND SOMEONE WOULD GET BACK WITH HIM

## 2017-07-17 ENCOUNTER — Other Ambulatory Visit (INDEPENDENT_AMBULATORY_CARE_PROVIDER_SITE_OTHER): Payer: Self-pay

## 2017-07-19 ENCOUNTER — Encounter
Admission: RE | Disposition: A | Discharge status: Home / Self Care | Payer: Self-pay | Source: Ambulatory Visit | Attending: Vascular Surgery

## 2017-07-19 ENCOUNTER — Ambulatory Visit
Admission: RE | Admit: 2017-07-19 | Discharge: 2017-07-19 | Disposition: A | Discharge status: Home / Self Care | Payer: Medicare Other | Source: Ambulatory Visit | Attending: Vascular Surgery | Admitting: Vascular Surgery

## 2017-07-19 DIAGNOSIS — Z9981 Dependence on supplemental oxygen: Secondary | ICD-10-CM | POA: Insufficient documentation

## 2017-07-19 DIAGNOSIS — E1122 Type 2 diabetes mellitus with diabetic chronic kidney disease: Secondary | ICD-10-CM | POA: Insufficient documentation

## 2017-07-19 DIAGNOSIS — Z905 Acquired absence of kidney: Secondary | ICD-10-CM | POA: Insufficient documentation

## 2017-07-19 DIAGNOSIS — M109 Gout, unspecified: Secondary | ICD-10-CM | POA: Insufficient documentation

## 2017-07-19 DIAGNOSIS — Z9842 Cataract extraction status, left eye: Secondary | ICD-10-CM | POA: Insufficient documentation

## 2017-07-19 DIAGNOSIS — Z90710 Acquired absence of both cervix and uterus: Secondary | ICD-10-CM | POA: Insufficient documentation

## 2017-07-19 DIAGNOSIS — Z8673 Personal history of transient ischemic attack (TIA), and cerebral infarction without residual deficits: Secondary | ICD-10-CM | POA: Insufficient documentation

## 2017-07-19 DIAGNOSIS — D631 Anemia in chronic kidney disease: Secondary | ICD-10-CM | POA: Insufficient documentation

## 2017-07-19 DIAGNOSIS — I509 Heart failure, unspecified: Secondary | ICD-10-CM | POA: Diagnosis not present

## 2017-07-19 DIAGNOSIS — Z7902 Long term (current) use of antithrombotics/antiplatelets: Secondary | ICD-10-CM | POA: Insufficient documentation

## 2017-07-19 DIAGNOSIS — G4730 Sleep apnea, unspecified: Secondary | ICD-10-CM | POA: Insufficient documentation

## 2017-07-19 DIAGNOSIS — J449 Chronic obstructive pulmonary disease, unspecified: Secondary | ICD-10-CM | POA: Insufficient documentation

## 2017-07-19 DIAGNOSIS — Z87442 Personal history of urinary calculi: Secondary | ICD-10-CM | POA: Diagnosis not present

## 2017-07-19 DIAGNOSIS — Z9889 Other specified postprocedural states: Secondary | ICD-10-CM | POA: Insufficient documentation

## 2017-07-19 DIAGNOSIS — K219 Gastro-esophageal reflux disease without esophagitis: Secondary | ICD-10-CM | POA: Diagnosis not present

## 2017-07-19 DIAGNOSIS — Z992 Dependence on renal dialysis: Secondary | ICD-10-CM | POA: Diagnosis not present

## 2017-07-19 DIAGNOSIS — I2510 Atherosclerotic heart disease of native coronary artery without angina pectoris: Secondary | ICD-10-CM | POA: Diagnosis not present

## 2017-07-19 DIAGNOSIS — R0600 Dyspnea, unspecified: Secondary | ICD-10-CM | POA: Diagnosis not present

## 2017-07-19 DIAGNOSIS — Z955 Presence of coronary angioplasty implant and graft: Secondary | ICD-10-CM | POA: Insufficient documentation

## 2017-07-19 DIAGNOSIS — Z452 Encounter for adjustment and management of vascular access device: Secondary | ICD-10-CM | POA: Diagnosis not present

## 2017-07-19 DIAGNOSIS — Z888 Allergy status to other drugs, medicaments and biological substances status: Secondary | ICD-10-CM | POA: Diagnosis not present

## 2017-07-19 DIAGNOSIS — I132 Hypertensive heart and chronic kidney disease with heart failure and with stage 5 chronic kidney disease, or end stage renal disease: Secondary | ICD-10-CM | POA: Insufficient documentation

## 2017-07-19 DIAGNOSIS — M1990 Unspecified osteoarthritis, unspecified site: Secondary | ICD-10-CM | POA: Insufficient documentation

## 2017-07-19 DIAGNOSIS — Z85528 Personal history of other malignant neoplasm of kidney: Secondary | ICD-10-CM | POA: Diagnosis not present

## 2017-07-19 DIAGNOSIS — E1140 Type 2 diabetes mellitus with diabetic neuropathy, unspecified: Secondary | ICD-10-CM | POA: Insufficient documentation

## 2017-07-19 DIAGNOSIS — Z794 Long term (current) use of insulin: Secondary | ICD-10-CM | POA: Diagnosis not present

## 2017-07-19 DIAGNOSIS — N186 End stage renal disease: Secondary | ICD-10-CM | POA: Insufficient documentation

## 2017-07-19 DIAGNOSIS — Z9841 Cataract extraction status, right eye: Secondary | ICD-10-CM | POA: Insufficient documentation

## 2017-07-19 HISTORY — PX: DIALYSIS/PERMA CATHETER REMOVAL: CATH118289

## 2017-07-19 LAB — GLUCOSE, CAPILLARY: Glucose-Capillary: 120 mg/dL — ABNORMAL HIGH (ref 65–99)

## 2017-07-19 SURGERY — DIALYSIS/PERMA CATHETER REMOVAL
Anesthesia: LOCAL

## 2017-07-19 MED ORDER — LIDOCAINE-EPINEPHRINE (PF) 2 %-1:200000 IJ SOLN
INTRAMUSCULAR | Status: AC
  Filled 2017-07-19: qty 20

## 2017-07-19 MED ORDER — LIDOCAINE-EPINEPHRINE (PF) 1 %-1:200000 IJ SOLN
INTRAMUSCULAR | Status: DC | PRN
  Administered 2017-07-19: 20 mL via INTRADERMAL

## 2017-07-19 MED ORDER — HYDROMORPHONE HCL 1 MG/ML IJ SOLN: 1.0000 mg | INTRAMUSCULAR | Status: AC | PRN

## 2017-07-19 MED ORDER — SODIUM CHLORIDE 0.9% FLUSH
3.0000 mL | INTRAVENOUS | Status: DC | PRN
Start: 2017-07-19 — End: 2017-07-19

## 2017-07-19 MED ORDER — ONDANSETRON HCL 4 MG/2ML IJ SOLN: 4.0000 mg | Freq: Once | INTRAMUSCULAR | Status: AC

## 2017-07-19 MED ORDER — SODIUM CHLORIDE 0.9 % IV SOLN: 250.0000 mL | INTRAVENOUS | Status: DC | PRN

## 2017-07-19 SURGICAL SUPPLY — 4 items
FCP FG STRG 5.5XNS LF DISP (INSTRUMENTS) ×1
FORCEPS FG STRG 5.5XNS LF DISP (INSTRUMENTS) ×1 IMPLANT
FORCEPS KELLY 5.5 STR (INSTRUMENTS) ×1
TRAY LACERAT/PLASTIC (MISCELLANEOUS) ×2 IMPLANT

## 2017-07-19 NOTE — H&P (Signed)
[facility] History & Physical Update  The patient was interviewed and re-examined.  The patient's previous History and Physical has been reviewed and is unchanged.  There is no change in the plan of care. We plan to proceed with the scheduled procedure.  [name], MD  07/19/2017, 12:33 PM

## 2017-07-19 NOTE — Progress Notes (Signed)
Pt. Post procedure(removal of dialysis cath). Left upper leg/thigh area clean, dry, intact without hematoma, edema, ecchymosis, erythema, drainage. Pt. Assisted OOB. Denies any c/o pain at site, N/V, dizziness, HA. Pt. Stable for DC.

## 2017-07-22 ENCOUNTER — Ambulatory Visit (INDEPENDENT_AMBULATORY_CARE_PROVIDER_SITE_OTHER): Payer: Medicare Other | Admitting: Podiatry

## 2017-07-22 DIAGNOSIS — B351 Tinea unguium: Secondary | ICD-10-CM | POA: Diagnosis not present

## 2017-07-22 DIAGNOSIS — M2042 Other hammer toe(s) (acquired), left foot: Secondary | ICD-10-CM

## 2017-07-22 DIAGNOSIS — M2041 Other hammer toe(s) (acquired), right foot: Secondary | ICD-10-CM

## 2017-07-22 DIAGNOSIS — M2030 Hallux varus (acquired), unspecified foot: Secondary | ICD-10-CM

## 2017-07-22 DIAGNOSIS — E1142 Type 2 diabetes mellitus with diabetic polyneuropathy: Secondary | ICD-10-CM

## 2017-07-22 DIAGNOSIS — M2142 Flat foot [pes planus] (acquired), left foot: Secondary | ICD-10-CM

## 2017-07-22 DIAGNOSIS — M79676 Pain in unspecified toe(s): Secondary | ICD-10-CM | POA: Diagnosis not present

## 2017-07-22 DIAGNOSIS — M2141 Flat foot [pes planus] (acquired), right foot: Secondary | ICD-10-CM

## 2017-07-22 NOTE — Progress Notes (Signed)
Complaint:  Visit Type: Patient returns to my office for continued preventative foot care services. Complaint: Patient states" my nails have grown Haggard and thick and become painful to walk and wear shoes".  Patient has also developed a painful area on her left big toe following previous nail surgery. Patient has been diagnosed with DM with no foot complications. The patient presents for preventative foot care services. No changes to ROS  Podiatric Exam: Vascular: dorsalis pedis and posterior tibial pulses are palpable bilateral. Capillary return is immediate. Temperature gradient is WNL. Skin turgor WNL  Sensorium: Diminished  Semmes Weinstein monofilament test. Diminished  tactile sensation bilaterally. Nail Exam: Pt has thick disfigured discolored nails with subungual debris noted bilateral entire nail hallux through fifth toenails except left hallux..  Skin lesion has developed on left nail bed with no infection noted. Ulcer Exam: There is no evidence of ulcer or pre-ulcerative changes or infection. Orthopedic Exam: Muscle tone and strength are WNL. No limitations in general ROM. No crepitus or effusions noted. Foot type and digits show no abnormalities. Bony prominences are unremarkable. Skin: No Porokeratosis. No infection or ulcers  Diagnosis:  Onychomycosis, , Pain in right toe, pain in left toes,  DPN  Hallux malleus  B/L   Hammer toes  B/L  Treatment & Plan Procedures and Treatment: Consent by patient was obtained for treatment procedures. The patient understood the discussion of treatment and procedures well. All questions were answered thoroughly reviewed. Debridement of mycotic and hypertrophic toenails, 1 through 5 bilateral and clearing of subungual debris. No ulceration, no infection noted.  Initiate diabetic footgear for DPN and hammer toe and hallux malleus Return Visit-Office Procedure: Patient instructed to return to the office for a follow up visit 3 months for continued  evaluation and treatment.    Gardiner Barefoot DPM

## 2017-07-23 ENCOUNTER — Encounter: Payer: Self-pay | Admitting: Vascular Surgery

## 2017-07-23 NOTE — Op Note (Signed)
  OPERATIVE NOTE   PROCEDURE: 1. Removal of a right thigh tunneled dialysis catheter  PRE-OPERATIVE DIAGNOSIS: Complication of dialysis catheter, End stage renal disease  POST-OPERATIVE DIAGNOSIS: Same  SURGEON: [name], M.D.  ANESTHESIA: Local anesthetic with 1% lidocaine with epinephrine   ESTIMATED BLOOD LOSS: Minimal   FINDING(S): 1. Catheter intact   SPECIMEN(S):  Catheter  INDICATIONS:   [name] is a 61 y.o. female who presents with functioning arm access.  The patient has undergone placement of an extremity access which is working and this has been successfully cannulated without difficulty.  therefore is undergoing removal of his tunneled catheter which is no longer needed to avoid septic complications.   DESCRIPTION: After obtaining full informed written consent, the patient was positioned supine. The right femoral catheter and surrounding area is prepped and draped in a sterile fashion. The cuff was localized by palpation and noted to be less than 3 cm from the exit site. After appropriate timeout is called, 1% lidocaine with epinephrine is infiltrated into the surrounding tissues around the cuff. Small transverse incision is created at the exit site with an 11 blade scalpel and the dissection was carried up along the catheter to expose the cuff of the tunneled catheter.  The catheter cuff is then freed from the surrounding attachments and adhesions. Once the catheter has been freed circumferentially it is removed in 1 piece. Light pressure was held at the base of the neck.   Antibiotic ointment and a sterile dressing is applied to the exit site. Patient tolerated procedure well and there were no complications.  COMPLICATIONS: None  CONDITION: Unchanged  [name], M.D. [facility] Office: [phone]  07/23/2017,12:02 PM

## 2017-07-23 NOTE — Telephone Encounter (Signed)
Done already

## 2017-10-28 ENCOUNTER — Ambulatory Visit: Payer: Medicare Other | Admitting: Podiatry

## 2017-11-04 ENCOUNTER — Encounter: Payer: Self-pay | Admitting: Podiatry

## 2017-11-04 ENCOUNTER — Ambulatory Visit (INDEPENDENT_AMBULATORY_CARE_PROVIDER_SITE_OTHER): Payer: Medicare Other | Admitting: Podiatry

## 2017-11-04 DIAGNOSIS — E1142 Type 2 diabetes mellitus with diabetic polyneuropathy: Secondary | ICD-10-CM

## 2017-11-04 DIAGNOSIS — B351 Tinea unguium: Secondary | ICD-10-CM

## 2017-11-04 DIAGNOSIS — M79676 Pain in unspecified toe(s): Secondary | ICD-10-CM

## 2017-11-04 DIAGNOSIS — M2041 Other hammer toe(s) (acquired), right foot: Secondary | ICD-10-CM

## 2017-11-04 DIAGNOSIS — M2030 Hallux varus (acquired), unspecified foot: Secondary | ICD-10-CM

## 2017-11-04 DIAGNOSIS — M2042 Other hammer toe(s) (acquired), left foot: Secondary | ICD-10-CM

## 2017-11-04 NOTE — Progress Notes (Signed)
Complaint:  Visit Type: Patient returns to my office for continued preventative foot care services. Complaint: Patient states" my nails have grown Demore and thick and become painful to walk and wear shoes".  Patient has also developed a mail spicule left big toe.. Patient has been diagnosed with DM with no foot complications. The patient presents for preventative foot care services. No changes to ROS  Podiatric Exam: Vascular: dorsalis pedis and posterior tibial pulses are palpable bilateral. Capillary return is immediate. Temperature gradient is WNL. Skin turgor WNL  Sensorium: Diminished  Semmes Weinstein monofilament test. Diminished  tactile sensation bilaterally. Nail Exam: Pt has thick disfigured discolored nails with subungual debris noted bilateral entire nail hallux through fifth toenails except left hallux..  Skin lesion has developed on left nail bed with no infection noted. Ulcer Exam: There is no evidence of ulcer or pre-ulcerative changes or infection. Orthopedic Exam: Muscle tone and strength are WNL. No limitations in general ROM. No crepitus or effusions noted. Foot type and digits show no abnormalities. Bony prominences are unremarkable. Skin: No Porokeratosis. No infection or ulcers  Diagnosis:  Onychomycosis, , Pain in right toe, pain in left toes,  DPN  Hallux malleus  B/L   Hammer toes  B/L  Treatment & Plan Procedures and Treatment: Consent by patient was obtained for treatment procedures. The patient understood the discussion of treatment and procedures well. All questions were answered thoroughly reviewed. Debridement of mycotic and hypertrophic toenails, 1 through 5 bilateral and clearing of subungual debris. No ulceration, no infection noted.   Return Visit-Office Procedure: Patient instructed to return to the office for a follow up visit 3 months for continued evaluation and treatment.    Gregory Mayer DPM 

## 2017-12-23 ENCOUNTER — Encounter (INDEPENDENT_AMBULATORY_CARE_PROVIDER_SITE_OTHER): Payer: Medicare Other

## 2017-12-23 ENCOUNTER — Ambulatory Visit (INDEPENDENT_AMBULATORY_CARE_PROVIDER_SITE_OTHER): Payer: Medicare Other | Admitting: Vascular Surgery

## 2018-02-03 ENCOUNTER — Encounter: Payer: Self-pay | Admitting: Podiatry

## 2018-02-03 ENCOUNTER — Ambulatory Visit (INDEPENDENT_AMBULATORY_CARE_PROVIDER_SITE_OTHER): Payer: Medicare Other | Admitting: Podiatry

## 2018-02-03 DIAGNOSIS — M2142 Flat foot [pes planus] (acquired), left foot: Secondary | ICD-10-CM

## 2018-02-03 DIAGNOSIS — E1142 Type 2 diabetes mellitus with diabetic polyneuropathy: Secondary | ICD-10-CM

## 2018-02-03 DIAGNOSIS — B351 Tinea unguium: Secondary | ICD-10-CM

## 2018-02-03 DIAGNOSIS — M79676 Pain in unspecified toe(s): Secondary | ICD-10-CM | POA: Diagnosis not present

## 2018-02-03 DIAGNOSIS — M2141 Flat foot [pes planus] (acquired), right foot: Secondary | ICD-10-CM

## 2018-02-03 NOTE — Progress Notes (Signed)
Complaint:  Visit Type: Patient returns to my office for continued preventative foot care services. Complaint: Patient states" my nails have grown Legan and thick and become painful to walk and wear shoes".  Patient has also developed a mail spicule left big toe.. Patient has been diagnosed with DM with no foot complications. The patient presents for preventative foot care services. No changes to ROS  Podiatric Exam: Vascular: dorsalis pedis and posterior tibial pulses are palpable bilateral. Capillary return is immediate. Temperature gradient is WNL. Skin turgor WNL  Sensorium: Diminished  Semmes Weinstein monofilament test. Diminished  tactile sensation bilaterally. Nail Exam: Pt has thick disfigured discolored nails with subungual debris noted bilateral entire nail hallux through fifth toenails except left hallux..  Skin lesion has developed on left nail bed with no infection noted. Ulcer Exam: There is no evidence of ulcer or pre-ulcerative changes or infection. Orthopedic Exam: Muscle tone and strength are WNL. No limitations in general ROM. No crepitus or effusions noted. Foot type and digits show no abnormalities. Bony prominences are unremarkable. Skin: No Porokeratosis. No infection or ulcers  Diagnosis:  Onychomycosis, , Pain in right toe, pain in left toes,  DPN  Hallux malleus  B/L   Hammer toes  B/L  Treatment & Plan Procedures and Treatment: Consent by patient was obtained for treatment procedures. The patient understood the discussion of treatment and procedures well. All questions were answered thoroughly reviewed. Debridement of mycotic and hypertrophic toenails, 1 through 5 bilateral and clearing of subungual debris. No ulceration, no infection noted.  Initiate diabetic footgear for DPN, hammer toes and pes planus. Return Visit-Office Procedure: Patient instructed to return to the office for a follow up visit 3 months for continued evaluation and treatment.    Gregory Mayer DPM 

## 2018-03-26 ENCOUNTER — Encounter: Payer: Medicare Other | Admitting: Orthotics

## 2018-04-16 ENCOUNTER — Encounter: Payer: Medicare Other | Admitting: Orthotics

## 2018-05-07 ENCOUNTER — Ambulatory Visit (INDEPENDENT_AMBULATORY_CARE_PROVIDER_SITE_OTHER): Payer: Medicare Other | Admitting: Orthotics

## 2018-05-07 ENCOUNTER — Ambulatory Visit (INDEPENDENT_AMBULATORY_CARE_PROVIDER_SITE_OTHER): Payer: Medicare Other | Admitting: Podiatry

## 2018-05-07 DIAGNOSIS — M2042 Other hammer toe(s) (acquired), left foot: Secondary | ICD-10-CM

## 2018-05-07 DIAGNOSIS — M2041 Other hammer toe(s) (acquired), right foot: Secondary | ICD-10-CM | POA: Diagnosis not present

## 2018-05-07 DIAGNOSIS — E1142 Type 2 diabetes mellitus with diabetic polyneuropathy: Secondary | ICD-10-CM

## 2018-05-07 DIAGNOSIS — M2142 Flat foot [pes planus] (acquired), left foot: Secondary | ICD-10-CM

## 2018-05-07 DIAGNOSIS — B351 Tinea unguium: Secondary | ICD-10-CM | POA: Diagnosis not present

## 2018-05-07 DIAGNOSIS — M79676 Pain in unspecified toe(s): Secondary | ICD-10-CM

## 2018-05-07 DIAGNOSIS — M2030 Hallux varus (acquired), unspecified foot: Secondary | ICD-10-CM

## 2018-05-07 DIAGNOSIS — M2141 Flat foot [pes planus] (acquired), right foot: Secondary | ICD-10-CM

## 2018-05-07 NOTE — Progress Notes (Signed)
She presents today to pick up her diabetic shoes from [place].  She states that she would like to have her toenails trimmed today since she would have to come tomorrow again to get them trimmed with Dr. Prudence Davidson.  Objective: Vital signs are stable she is alert and oriented x3 there is no erythema edema cellulitis drainage or odor toenails are Rubinstein thick yellow dystrophic click mycotic no open lesions or wounds.  Assessment: Pain in limb secondary to diabetic peripheral neuropathy as well as pain in limb secondary to onychomycosis.  Plan: Debridement of toenails 1 through 5 bilateral.  Follow-up with Dr. Sharyon Cable in 3 months.

## 2018-05-07 NOTE — Progress Notes (Signed)

## 2018-05-08 ENCOUNTER — Ambulatory Visit: Payer: Medicare Other | Admitting: Podiatry

## 2018-05-12 ENCOUNTER — Ambulatory Visit: Payer: Medicare Other | Admitting: Podiatry

## 2018-07-07 ENCOUNTER — Ambulatory Visit (INDEPENDENT_AMBULATORY_CARE_PROVIDER_SITE_OTHER): Payer: Medicare Other | Admitting: Vascular Surgery

## 2018-07-07 ENCOUNTER — Encounter (INDEPENDENT_AMBULATORY_CARE_PROVIDER_SITE_OTHER): Payer: Self-pay | Admitting: Vascular Surgery

## 2018-07-07 VITALS — BP 136/66 | HR 97 | Resp 16 | Ht 66.0 in | Wt 286.0 lb

## 2018-07-07 DIAGNOSIS — T829XXS Unspecified complication of cardiac and vascular prosthetic device, implant and graft, sequela: Secondary | ICD-10-CM | POA: Diagnosis not present

## 2018-07-07 DIAGNOSIS — M79602 Pain in left arm: Secondary | ICD-10-CM

## 2018-07-07 DIAGNOSIS — I10 Essential (primary) hypertension: Secondary | ICD-10-CM

## 2018-07-07 DIAGNOSIS — N186 End stage renal disease: Secondary | ICD-10-CM

## 2018-07-07 DIAGNOSIS — E1122 Type 2 diabetes mellitus with diabetic chronic kidney disease: Secondary | ICD-10-CM

## 2018-07-07 DIAGNOSIS — Z992 Dependence on renal dialysis: Secondary | ICD-10-CM

## 2018-07-16 ENCOUNTER — Encounter (INDEPENDENT_AMBULATORY_CARE_PROVIDER_SITE_OTHER): Payer: Self-pay | Admitting: Vascular Surgery

## 2018-07-16 DIAGNOSIS — M79602 Pain in left arm: Secondary | ICD-10-CM | POA: Insufficient documentation

## 2018-07-16 DIAGNOSIS — M25529 Pain in unspecified elbow: Secondary | ICD-10-CM | POA: Insufficient documentation

## 2018-07-16 NOTE — Progress Notes (Signed)
MRN : [id number]  [name] is a 62 y.o. (1956-01-26) female who presents with chief complaint of  Chief Complaint  Patient presents with  . Follow-up  .  History of Present Illness:  The patient returns to the office for followup of their dialysis access. The function of the access has been stable. The patient denies increased bleeding time or increased recirculation. Patient denies difficulty with cannulation. The patient denies hand pain or other symptoms consistent with steal phenomena.  She is complaining of shoulder pain that radiates down toward the elbow.  The dialysis center and the patient herself have voiced concerns regarding could this be steal syndrome.  No significant arm swelling.  The patient denies redness or swelling at the access site. The patient denies fever or chills at home or while on dialysis.  The patient denies amaurosis fugax or recent TIA symptoms. There are no recent neurological changes noted. The patient denies claudication symptoms or rest pain symptoms. The patient denies history of DVT, PE or superficial thrombophlebitis. The patient denies recent episodes of angina or shortness of breath.   Duplex ultrasound shows a patent hero graft no focal hemodynamically significant lesions noted     Current Meds  Medication Sig  . acetaminophen (TYLENOL 8 HOUR) 650 MG CR tablet Take 650 mg by mouth every 8 (eight) hours as needed for pain.  Marland Kitchen albuterol (PROVENTIL HFA;VENTOLIN HFA) 108 (90 Base) MCG/ACT inhaler Inhale 2 puffs into the lungs every 6 (six) hours as needed for wheezing or shortness of breath.  . allopurinol (ZYLOPRIM) 100 MG tablet Take 150 mg by mouth Every Tuesday,Thursday,and Saturday with dialysis.   Marland Kitchen amitriptyline (ELAVIL) 25 MG tablet Take 25 mg by mouth at bedtime.   Marland Kitchen amLODipine (NORVASC) 10 MG tablet Take 10 mg by mouth daily.  Marland Kitchen atorvastatin (LIPITOR) 40 MG tablet Take 40 mg by mouth daily at 12 noon.  . calcium carbonate  (TUMS - DOSED IN MG ELEMENTAL CALCIUM) 500 MG chewable tablet Chew 1 tablet by mouth as needed for indigestion or heartburn.  . carboxymethylcellulose (REFRESH PLUS) 0.5 % SOLN Place 1-2 drops into both eyes 3 (three) times daily as needed (for dry eyes).  . citalopram (CELEXA) 40 MG tablet Take 40 mg by mouth daily.   . clopidogrel (PLAVIX) 75 MG tablet Take 75 mg by mouth daily.   . diclofenac sodium (VOLTAREN) 1 % GEL Apply 1 g topically 2 (two) times daily as needed (for pain.).  Marland Kitchen diphenhydrAMINE (BENADRYL) 25 MG tablet Take 2 tablets (50 mg total) by mouth as directed. Take Friday morning 04/05/17 before procedure. (Patient taking differently: Take 50 mg by mouth as directed. Take Wednesday morning 6/20 prior to coming to hospital)  . docusate sodium (STOOL SOFTENER) 100 MG capsule Take 200 mg by mouth daily.   . fluticasone (FLONASE) 50 MCG/ACT nasal spray Place 2 sprays into both nostrils daily.   . GNP ALLERGY 25 MG tablet Take 25 mg by mouth at bedtime.   . hydroxypropyl methylcellulose / hypromellose (ISOPTO TEARS / GONIOVISC) 2.5 % ophthalmic solution Place 1-2 drops into both eyes 4 (four) times daily as needed for dry eyes.  Marland Kitchen lidocaine-prilocaine (EMLA) cream Apply 1 application topically as needed (prior to dialysis).  Marland Kitchen liraglutide 18 MG/3ML SOPN Inject 1.8 mg into the skin.  Marland Kitchen loratadine (CLARITIN) 10 MG tablet Take 10 mg by mouth at bedtime.  . Misc. Devices [facility]) MISC Frequency:PHARMDIR   Dosage:0.0     Instructions:  Note:Dx: Deconditioning with orthostatic hypotension Dose: 1  . nystatin (MYCOSTATIN/NYSTOP) powder Apply 1 g topically daily as needed (for skin rashes.).   Marland Kitchen OXYGEN Inhale 2 L into the lungs at bedtime.  . polyethylene glycol powder (GLYCOLAX/MIRALAX) powder Take 17 g by mouth daily. With coffee  . predniSONE (DELTASONE) 50 MG tablet Take 1 tablet 04/04/17 at 6PM, Take 1 tablet 04/04/17 at bedtime, and take 1 tablet 04/05/17 in AM  . ranitidine (ZANTAC) 150 MG  tablet Take 150 mg by mouth daily.   Marland Kitchen RENVELA 800 MG tablet Take 800 mg by mouth 3 (three) times daily with meals.   Marland Kitchen VICTOZA 18 MG/3ML SOPN Inject 0.3 mL (1.8 mg total) under the skin daily.   Current Facility-Administered Medications for the 07/07/18 encounter (Office Visit) with Delana Meyer, Dolores Lory, MD  Medication  . HYDROmorphone (DILAUDID) injection 1 mg  . ondansetron (ZOFRAN) injection 4 mg    Past Medical History:  Diagnosis Date  . Anemia   . Anxiety   . Arthritis   . Cancer [facility])    Left Kidney Cancer  . CHF (congestive heart failure) ([place])   . Chronic kidney disease   . COPD (chronic obstructive pulmonary disease) ([facility])    Use Oxygen at bedtime  . Coronary artery disease   . Diabetes mellitus without complication ([place])   . Dialysis patient ([place])    Tues, Thurs, Sat  . Dyspnea    with exertion  . GERD (gastroesophageal reflux disease)   . Gout   . History of kidney stones   . Hypertension   . Neuropathy   . Peripheral vascular disease ([place])   . Sleep apnea    OSA--USE C-PAP  . Stroke [facility]) 2004    Past Surgical History:  Procedure Laterality Date  . A/V FISTULAGRAM Left 04/26/2017   Procedure: A/V Fistulagram;  Surgeon: Katha Cabal, MD;  Location: [place] [facility];  Service: Cardiovascular;  Laterality: Left;  . ABDOMINAL HYSTERECTOMY    . CARDIAC CATHETERIZATION    . DIALYSIS/PERMA CATHETER INSERTION N/A 01/31/2017   Procedure: Dialysis/Perma Catheter Insertion;  Surgeon: Algernon Huxley, MD;  Location: [place] [facility];  Service: Cardiovascular;  Laterality: N/A;  . DIALYSIS/PERMA CATHETER INSERTION N/A 02/21/2017   Procedure: Dialysis/Perma Catheter Insertion;  Surgeon: Algernon Huxley, MD;  Location: [place] [facility];  Service: Cardiovascular;  Laterality: N/A;  . DIALYSIS/PERMA CATHETER INSERTION N/A 03/19/2017   Procedure: Dialysis/Perma Catheter Insertion;  Surgeon: Katha Cabal, MD;  Location: [place] [facility];  Service: Cardiovascular;   Laterality: N/A;  . DIALYSIS/PERMA CATHETER REMOVAL N/A 07/19/2017   Procedure: DIALYSIS/PERMA CATHETER REMOVAL;  Surgeon: Katha Cabal, MD;  Location: [place] [facility];  Service: Cardiovascular;  Laterality: N/A;  . EYE SURGERY Bilateral    Cataract Extraction with IOL  . KIDNEY SURGERY Left    Partial Nephrectomy  . LEFT HEART CATH AND CORONARY ANGIOGRAPHY N/A 04/05/2017   Procedure: Left Heart Cath and Coronary Angiography;  Surgeon: Wellington Hampshire, MD;  Location: [place] [facility];  Service: Cardiovascular;  Laterality: N/A;  . PERIPHERAL VASCULAR CATHETERIZATION N/A 01/23/2016   Procedure: Dialysis/Perma Catheter Insertion;  Surgeon: Algernon Huxley, MD;  Location: [place] [facility];  Service: Cardiovascular;  Laterality: N/A;  . PERIPHERAL VASCULAR CATHETERIZATION Left 10/01/2016   Procedure: A/V Shuntogram/Fistulagram;  Surgeon: Algernon Huxley, MD;  Location: [place] [facility];  Service: Cardiovascular;  Laterality: Left;  . UPPER EXTREMITY VENOGRAPHY Bilateral 04/26/2017  Procedure: Upper Extremity Venography;  Surgeon: Katha Cabal, MD;  Location: [place] [facility];  Service: Cardiovascular;  Laterality: Bilateral;  . VASCULAR ACCESS DEVICE INSERTION Left 05/22/2017   Procedure: INSERTION OF HERO VASCULAR ACCESS DEVICE;  Surgeon: Katha Cabal, MD;  Location: [facility];  Service: Vascular;  Laterality: Left;    Social History Social History   Tobacco Use  . Smoking status: Former Research scientist (life sciences)  . Smokeless tobacco: Never Used  Substance Use Topics  . Alcohol use: No  . Drug use: No    Family History Family History  Problem Relation Age of Onset  . Hypertension Mother   . Heart failure Mother   . Diabetes Father   . Heart attack Father   . Lung cancer Maternal Aunt   . Cancer Maternal Aunt     Allergies  Allergen Reactions  . Iodine Rash  . Enalapril Other (See Comments)    TONGUE SWELLS/GOUT  . Iodinated Diagnostic Agents Other (See Comments)     BREAK OUT IN WHELPS       REVIEW OF SYSTEMS (Negative unless checked)  Constitutional: [] Weight loss  [] Fever  [] Chills Cardiac: [] Chest pain   [] Chest pressure   [] Palpitations   [] Shortness of breath when laying flat   [] Shortness of breath with exertion. Vascular:  [] Pain in legs with walking   [] Pain in legs at rest  [] History of DVT   [] Phlebitis   [] Swelling in legs   [] Varicose veins   [] Non-healing ulcers Pulmonary:   [] Uses home oxygen   [] Productive cough   [] Hemoptysis   [] Wheeze  [] COPD   [] Asthma Neurologic:  [] Dizziness   [] Seizures   [] History of stroke   [] History of TIA  [] Aphasia   [] Vissual changes   [] Weakness or numbness in arm   [] Weakness or numbness in leg Musculoskeletal:   [x] Joint swelling   [x] Joint pain   [] Low back pain Hematologic:  [] Easy bruising  [] Easy bleeding   [] Hypercoagulable state   [] Anemic Gastrointestinal:  [] Diarrhea   [] Vomiting  [] Gastroesophageal reflux/heartburn   [] Difficulty swallowing. Genitourinary:  [x] Chronic kidney disease   [] Difficult urination  [] Frequent urination   [] Blood in urine Skin:  [] Rashes   [] Ulcers  Psychological:  [] History of anxiety   []  History of major depression.  Physical Examination  Vitals:   07/07/18 1603  BP: 136/66  Pulse: 97  Resp: 16  Weight: 286 lb (129.7 kg)  Height: 5\' 6"  (1.676 m)   Body mass index is 46.16 kg/m. Gen: WD/WN, NAD Head: [place]/AT, No temporalis wasting.  Ear/Nose/Throat: Hearing grossly intact, nares w/o erythema or drainage Eyes: PER, EOMI, sclera nonicteric.  Neck: Supple, no large masses.   Pulmonary:  Good air movement, no audible wheezing bilaterally, no use of accessory muscles.  Cardiac: RRR, no JVD Vascular: Left arm hero graft good thrill good bruit skin overlying the graft appears fairly healthy no obvious ulceration.  There is a 2+ palpable radial pulse at the left wrist. Vessel Right Left  Radial Palpable Palpable  Brachial Palpable Palpable  Carotid Palpable  Palpable  Gastrointestinal: Non-distended. No guarding/no peritoneal signs.  Musculoskeletal: M/S 5/5 throughout.  No deformity or atrophy.  Neurologic: CN 2-12 intact. Symmetrical.  Speech is fluent. Motor exam as listed above. Psychiatric: Judgment intact, Mood & affect appropriate for pt's clinical situation. Dermatologic: No rashes or ulcers noted.  No changes consistent with cellulitis. Lymph : No lichenification or skin changes of chronic lymphedema.  CBC Lab Results  Component Value Date  WBC 7.7 05/27/2017   HGB 10.4 (L) 05/27/2017   HCT 31.2 (L) 05/27/2017   MCV 89.1 05/27/2017   PLT 278 05/27/2017    BMET    Component Value Date/Time   NA 132 (L) 05/27/2017 1821   NA 129 (L) 03/31/2015 0420   K 4.4 05/27/2017 1821   K 4.2 03/31/2015 0420   CL 94 (L) 05/27/2017 1821   CL 88 (L) 03/31/2015 0420   CO2 27 05/27/2017 1821   CO2 28 03/31/2015 0420   GLUCOSE 122 (H) 05/27/2017 1821   GLUCOSE 144 (H) 03/31/2015 0420   BUN 45 (H) 05/27/2017 1821   BUN 61 (H) 03/31/2015 0420   CREATININE 9.58 (H) 05/27/2017 1821   CREATININE 10.20 (H) 03/31/2015 0420   CALCIUM 8.3 (L) 05/27/2017 1821   CALCIUM 7.0 (LL) 03/31/2015 0420   GFRNONAA 4 (L) 05/27/2017 1821   GFRNONAA 4 (L) 03/31/2015 0420   GFRAA 4 (L) 05/27/2017 1821   GFRAA 4 (L) 03/31/2015 0420   CrCl cannot be calculated (Patient's most recent lab result is older than the maximum 21 days allowed.).  COAG Lab Results  Component Value Date   INR 1.04 05/13/2017   INR 1.01 04/04/2017   INR 1.02 02/25/2017    Radiology No results found.   Assessment/Plan 1. End stage kidney disease ([place]) Continue dialysis without interruption.  Hero graft appears to be functioning well.  Given the palpable radial pulse there is no evidence to support steal phenomena.  Given the distribution of the patient's arm pain I would be highly suspicious this represents a musculoskeletal issue, degenerative changes of the cervical spine  etc.  2. Complication from renal dialysis device, sequela Recommend:  The patient is doing well and currently has adequate dialysis access. The patient's dialysis center is not reporting any access issues. Flow pattern is stable when compared to the prior ultrasound.  The patient should have a duplex ultrasound of the dialysis access in 6 months. The patient will follow-up with me in the office after each ultrasound   - VAS [place] (AVF, AVG); Future  3. Left arm pain See #1  4. Type 2 diabetes mellitus with chronic kidney disease on chronic dialysis, unspecified whether [place] term insulin use ([place]) Continue hypoglycemic medications as already ordered, these medications have been reviewed and there are no changes at this time.  Hgb A1C to be monitored as already arranged by primary service   5. Essential hypertension Continue antihypertensive medications as already ordered, these medications have been reviewed and there are no changes at this time.     [name], MD  07/16/2018 1:59 PM

## 2018-08-11 ENCOUNTER — Ambulatory Visit (INDEPENDENT_AMBULATORY_CARE_PROVIDER_SITE_OTHER): Payer: Medicare Other | Admitting: Podiatry

## 2018-08-11 ENCOUNTER — Encounter: Payer: Self-pay | Admitting: Podiatry

## 2018-08-11 DIAGNOSIS — M79676 Pain in unspecified toe(s): Secondary | ICD-10-CM | POA: Diagnosis not present

## 2018-08-11 DIAGNOSIS — B351 Tinea unguium: Secondary | ICD-10-CM

## 2018-08-11 DIAGNOSIS — E1142 Type 2 diabetes mellitus with diabetic polyneuropathy: Secondary | ICD-10-CM | POA: Diagnosis not present

## 2018-08-11 NOTE — Progress Notes (Signed)
Complaint:  Visit Type: Patient returns to my office for continued preventative foot care services. Complaint: Patient states" my nails have grown [place] and thick and become painful to walk and wear shoes".  Patient has also developed a mail spicule left big toe.. Patient has been diagnosed with DM with no foot complications. The patient presents for preventative foot care services. No changes to ROS  Podiatric Exam: Vascular: dorsalis pedis and posterior tibial pulses are palpable bilateral. Capillary return is immediate. Temperature gradient is WNL. Skin turgor WNL  Sensorium: Diminished  Semmes Weinstein monofilament test. Diminished  tactile sensation bilaterally. Nail Exam: Pt has thick disfigured discolored nails with subungual debris noted bilateral entire nail hallux through fifth toenails except left hallux..  Skin lesion has developed on left nail bed with no infection noted. Ulcer Exam: There is no evidence of ulcer or pre-ulcerative changes or infection. Orthopedic Exam: Muscle tone and strength are WNL. No limitations in general ROM. No crepitus or effusions noted. Foot type and digits show no abnormalities. Bony prominences are unremarkable. Skin: No Porokeratosis. No infection or ulcers  Diagnosis:  Onychomycosis, , Pain in right toe, pain in left toes,  DPN  Hallux malleus  B/L   Hammer toes  B/L  Treatment & Plan Procedures and Treatment: Consent by patient was obtained for treatment procedures. The patient understood the discussion of treatment and procedures well. All questions were answered thoroughly reviewed. Debridement of mycotic and hypertrophic toenails, 1 through 5 bilateral and clearing of subungual debris. No ulceration, no infection noted.  Initiate diabetic footgear for DPN, hammer toes and pes planus. Return Visit-Office Procedure: Patient instructed to return to the office for a follow up visit 3 months for continued evaluation and treatment.    Gardiner Barefoot DPM

## 2018-10-13 ENCOUNTER — Other Ambulatory Visit
Admission: RE | Admit: 2018-10-13 | Discharge: 2018-10-13 | Disposition: A | Discharge status: Home / Self Care | Payer: Medicare Other | Source: Ambulatory Visit | Attending: Ophthalmology | Admitting: Ophthalmology

## 2018-10-13 DIAGNOSIS — H16002 Unspecified corneal ulcer, left eye: Secondary | ICD-10-CM | POA: Diagnosis present

## 2018-10-16 LAB — EYE CULTURE

## 2018-11-03 LAB — CULTURE, FUNGUS WITHOUT SMEAR

## 2018-11-10 ENCOUNTER — Encounter: Payer: Self-pay | Admitting: Podiatry

## 2018-11-10 ENCOUNTER — Ambulatory Visit (INDEPENDENT_AMBULATORY_CARE_PROVIDER_SITE_OTHER): Payer: Medicare Other | Admitting: Podiatry

## 2018-11-10 DIAGNOSIS — M2142 Flat foot [pes planus] (acquired), left foot: Secondary | ICD-10-CM

## 2018-11-10 DIAGNOSIS — B351 Tinea unguium: Secondary | ICD-10-CM | POA: Diagnosis not present

## 2018-11-10 DIAGNOSIS — M79676 Pain in unspecified toe(s): Secondary | ICD-10-CM | POA: Diagnosis not present

## 2018-11-10 DIAGNOSIS — E1142 Type 2 diabetes mellitus with diabetic polyneuropathy: Secondary | ICD-10-CM

## 2018-11-10 DIAGNOSIS — M2141 Flat foot [pes planus] (acquired), right foot: Secondary | ICD-10-CM

## 2018-11-10 DIAGNOSIS — M2030 Hallux varus (acquired), unspecified foot: Secondary | ICD-10-CM

## 2018-11-10 NOTE — Progress Notes (Signed)
Complaint:  Visit Type: Patient returns to my office for continued preventative foot care services. Complaint: Patient states" my nails have grown [place] and thick and become painful to walk and wear shoes".  Patient has also developed a mail spicule left big toe.. Patient has been diagnosed with DM with no foot complications. The patient presents for preventative foot care services. No changes to ROS  Podiatric Exam: Vascular: dorsalis pedis and posterior tibial pulses are palpable bilateral. Capillary return is immediate. Temperature gradient is WNL. Skin turgor WNL  Sensorium: Diminished  Semmes Weinstein monofilament test. Diminished  tactile sensation bilaterally. Nail Exam: Pt has thick disfigured discolored nails with subungual debris noted bilateral entire nail hallux through fifth toenails except left hallux..  Skin lesion has developed on left nail bed with no infection noted. Ulcer Exam: There is no evidence of ulcer or pre-ulcerative changes or infection. Orthopedic Exam: Muscle tone and strength are WNL. No limitations in general ROM. No crepitus or effusions noted. Foot type and digits show no abnormalities. Bony prominences are unremarkable. Skin: No Porokeratosis. No infection or ulcers  Diagnosis:  Onychomycosis, , Pain in right toe, pain in left toes,  DPN  Hallux malleus  B/L   Hammer toes  B/L  Treatment & Plan Procedures and Treatment: Consent by patient was obtained for treatment procedures. The patient understood the discussion of treatment and procedures well. All questions were answered thoroughly reviewed. Debridement of mycotic and hypertrophic toenails, 1 through 5 bilateral and clearing of subungual debris. No ulceration, no infection noted.   Return Visit-Office Procedure: Patient instructed to return to the office for a follow up visit 3 months for continued evaluation and treatment.    Gardiner Barefoot DPM

## 2018-11-12 ENCOUNTER — Encounter (INDEPENDENT_AMBULATORY_CARE_PROVIDER_SITE_OTHER): Payer: Self-pay | Admitting: Vascular Surgery

## 2018-11-12 ENCOUNTER — Ambulatory Visit (INDEPENDENT_AMBULATORY_CARE_PROVIDER_SITE_OTHER): Payer: Medicare Other | Admitting: Vascular Surgery

## 2018-11-12 ENCOUNTER — Other Ambulatory Visit (INDEPENDENT_AMBULATORY_CARE_PROVIDER_SITE_OTHER): Payer: Medicare Other

## 2018-11-12 VITALS — BP 114/53 | HR 72 | Resp 18 | Ht 64.0 in | Wt 286.0 lb

## 2018-11-12 DIAGNOSIS — T829XXS Unspecified complication of cardiac and vascular prosthetic device, implant and graft, sequela: Secondary | ICD-10-CM | POA: Diagnosis not present

## 2018-11-12 DIAGNOSIS — Z992 Dependence on renal dialysis: Secondary | ICD-10-CM | POA: Diagnosis not present

## 2018-11-12 DIAGNOSIS — N186 End stage renal disease: Secondary | ICD-10-CM | POA: Diagnosis not present

## 2018-11-12 NOTE — Progress Notes (Signed)
Subjective:    Patient ID: [name], female    DOB: [birth year], 62 y.o.   MRN: [id number] Chief Complaint  Patient presents with  . Follow-up    HDA f/u   Patient presents for a 34-month HDA follow-up.  The patient has a left hero graft. The patient underwent a duplex ultrasound of the AV access which was notable for a patent fistula without any significant hemodynamic stenosis.  Total flow volume 1055. Patient reports his hemodialysis doppler flow / function is stable. The patient denies any issues with hemodialysis such as cannulation problems, increased bleeding, decrease in doppler flow or recirculation. The patient also denies any fistula skin breakdown, pain, edema, pallor or ulceration of the arm / hand.  She denies any urinary symptoms.  The patient denies any fever, nausea vomiting.  Review of Systems  Constitutional: Negative.   HENT: Negative.   Eyes: Negative.   Respiratory: Negative.   Cardiovascular: Negative.   Gastrointestinal: Negative.   Endocrine: Negative.   Genitourinary:       ESRD  Musculoskeletal: Negative.   Skin: Negative.   Allergic/Immunologic: Negative.   Neurological: Negative.   Hematological: Negative.   Psychiatric/Behavioral: Negative.       Objective:   Physical Exam  Constitutional: She is oriented to person, place, and time. She appears well-developed and well-nourished. No distress.  HENT:  Head: Normocephalic and atraumatic.  Right Ear: External ear normal.  Left Ear: External ear normal.  Eyes: Pupils are equal, round, and reactive to light. Conjunctivae and EOM are normal.  Neck: Normal range of motion.  Cardiovascular: Normal rate, regular rhythm, normal heart sounds and intact distal pulses.  Pulses:      Radial pulses are 2+ on the right side, and 2+ on the left side.  Left upper extremity hero graft: Bruit and thrill.  Skin is intact.  Left hand is warm.  Skin is intact.  Pulmonary/Chest: Effort normal and breath sounds  normal.  Musculoskeletal: Normal range of motion. She exhibits edema (Mild bilateral edema noted).  Neurological: She is alert and oriented to person, place, and time.  Skin: Skin is warm and dry. She is not diaphoretic.  Psychiatric: She has a normal mood and affect. Her behavior is normal. Judgment and thought content normal.  Vitals reviewed.  BP (!) 114/53 (BP Location: Right Arm, Patient Position: Sitting)   Pulse 72   Resp 18   Ht 5\' 4"  (1.626 m)   Wt 286 lb (129.7 kg)   BMI 49.09 kg/m   Past Medical History:  Diagnosis Date  . Anemia   . Anxiety   . Arthritis   . Cancer [facility])    Left Kidney Cancer  . CHF (congestive heart failure) ([place])   . Chronic kidney disease   . COPD (chronic obstructive pulmonary disease) ([facility])    Use Oxygen at bedtime  . Coronary artery disease   . Diabetes mellitus without complication ([place])   . Dialysis patient ([place])    Tues, Thurs, Sat  . Dyspnea    with exertion  . GERD (gastroesophageal reflux disease)   . Gout   . History of kidney stones   . Hypertension   . Neuropathy   . Peripheral vascular disease ([place])   . Sleep apnea    OSA--USE C-PAP  . Stroke [facility]) 2004   Social History   Socioeconomic History  . Marital status: Married    Spouse name: Not on file  . Number of children: Not on  file  . Years of education: Not on file  . Highest education level: Not on file  Occupational History  . Not on file  Social Needs  . Financial resource strain: Not on file  . Food insecurity:    Worry: Not on file    Inability: Not on file  . Transportation needs:    Medical: Not on file    Non-medical: Not on file  Tobacco Use  . Smoking status: Former Research scientist (life sciences)  . Smokeless tobacco: Never Used  Substance and Sexual Activity  . Alcohol use: No  . Drug use: No  . Sexual activity: Never  Lifestyle  . Physical activity:    Days per week: Not on file    Minutes per session: Not on file  . Stress: Not on file  Relationships  . Social  connections:    Talks on phone: Not on file    Gets together: Not on file    Attends religious service: Not on file    Active member of club or organization: Not on file    Attends meetings of clubs or organizations: Not on file    Relationship status: Not on file  . Intimate partner violence:    Fear of current or ex partner: Not on file    Emotionally abused: Not on file    Physically abused: Not on file    Forced sexual activity: Not on file  Other Topics Concern  . Not on file  Social History Narrative  . Not on file   Past Surgical History:  Procedure Laterality Date  . A/V FISTULAGRAM Left 04/26/2017   Procedure: A/V Fistulagram;  Surgeon: Katha Cabal, MD;  Location: [place] [facility];  Service: Cardiovascular;  Laterality: Left;  . ABDOMINAL HYSTERECTOMY    . CARDIAC CATHETERIZATION    . DIALYSIS/PERMA CATHETER INSERTION N/A 01/31/2017   Procedure: Dialysis/Perma Catheter Insertion;  Surgeon: Algernon Huxley, MD;  Location: [place] [facility];  Service: Cardiovascular;  Laterality: N/A;  . DIALYSIS/PERMA CATHETER INSERTION N/A 02/21/2017   Procedure: Dialysis/Perma Catheter Insertion;  Surgeon: Algernon Huxley, MD;  Location: [place] [facility];  Service: Cardiovascular;  Laterality: N/A;  . DIALYSIS/PERMA CATHETER INSERTION N/A 03/19/2017   Procedure: Dialysis/Perma Catheter Insertion;  Surgeon: Katha Cabal, MD;  Location: [place] [facility];  Service: Cardiovascular;  Laterality: N/A;  . DIALYSIS/PERMA CATHETER REMOVAL N/A 07/19/2017   Procedure: DIALYSIS/PERMA CATHETER REMOVAL;  Surgeon: Katha Cabal, MD;  Location: [place] [facility];  Service: Cardiovascular;  Laterality: N/A;  . EYE SURGERY Bilateral    Cataract Extraction with IOL  . KIDNEY SURGERY Left    Partial Nephrectomy  . LEFT HEART CATH AND CORONARY ANGIOGRAPHY N/A 04/05/2017   Procedure: Left Heart Cath and Coronary Angiography;  Surgeon: Wellington Hampshire, MD;  Location: [place] [facility];   Service: Cardiovascular;  Laterality: N/A;  . PERIPHERAL VASCULAR CATHETERIZATION N/A 01/23/2016   Procedure: Dialysis/Perma Catheter Insertion;  Surgeon: Algernon Huxley, MD;  Location: [place] [facility];  Service: Cardiovascular;  Laterality: N/A;  . PERIPHERAL VASCULAR CATHETERIZATION Left 10/01/2016   Procedure: A/V Shuntogram/Fistulagram;  Surgeon: Algernon Huxley, MD;  Location: [place] [facility];  Service: Cardiovascular;  Laterality: Left;  . UPPER EXTREMITY VENOGRAPHY Bilateral 04/26/2017   Procedure: Upper Extremity Venography;  Surgeon: Katha Cabal, MD;  Location: [place] [facility];  Service: Cardiovascular;  Laterality: Bilateral;  . VASCULAR ACCESS DEVICE INSERTION Left 05/22/2017   Procedure: INSERTION  OF HERO VASCULAR ACCESS DEVICE;  Surgeon: Delana Meyer Dolores Lory, MD;  Location: [facility];  Service: Vascular;  Laterality: Left;   Family History  Problem Relation Age of Onset  . Hypertension Mother   . Heart failure Mother   . Diabetes Father   . Heart attack Father   . Lung cancer Maternal Aunt   . Cancer Maternal Aunt    Allergies  Allergen Reactions  . Iodine Rash  . Enalapril Other (See Comments)    TONGUE SWELLS/GOUT  . Iodinated Diagnostic Agents Other (See Comments)    BREAK OUT IN WHELPS        Assessment & Plan:  Patient presents for a 38-month HDA follow-up.  The patient has a left hero graft. The patient underwent a duplex ultrasound of the AV access which was notable for a patent fistula without any significant hemodynamic stenosis.  Total flow volume 1055. Patient reports his hemodialysis doppler flow / function is stable. The patient denies any issues with hemodialysis such as cannulation problems, increased bleeding, decrease in doppler flow or recirculation. The patient also denies any fistula skin breakdown, pain, edema, pallor or ulceration of the arm / hand.  She denies any urinary symptoms.  The patient denies any fever, nausea vomiting.  1. ESRD on  dialysis ([place]) - Stable Studies reviewed with patient. The patient is doing well and currently has adequate dialysis access. Duplex ultrasound of the AV access shows a patent access with no evidence of hemodynamically significant strictures or stenosis.  The patient should continue to have duplex ultrasounds of the dialysis access every six months. The patient was instructed to call the office in the interim if any issues with dialysis access / doppler flow, pain, edema, pallor, fistula skin breakdown or ulceration of the arm / hand occur. The patient expressed their understanding.  - VAS US DUPLEX DIALYSIS ACCESS (AVF,AVG); Future  2. Complication from renal dialysis device, sequela - Stable As above  - VAS US DUPLEX DIALYSIS ACCESS (AVF,AVG); Future  Current Outpatient Medications on File Prior to Visit  Medication Sig Dispense Refill  . acetaminophen (TYLENOL 8 HOUR) 650 MG CR tablet Take 650 mg by mouth every 8 (eight) hours as needed for pain.    Marland Kitchen albuterol (PROVENTIL HFA;VENTOLIN HFA) 108 (90 Base) MCG/ACT inhaler Inhale 2 puffs into the lungs every 6 (six) hours as needed for wheezing or shortness of breath.    . allopurinol (ZYLOPRIM) 100 MG tablet Take 150 mg by mouth Every Tuesday,Thursday,and Saturday with dialysis.     Marland Kitchen amitriptyline (ELAVIL) 25 MG tablet Take 25 mg by mouth at bedtime.     Marland Kitchen amLODipine (NORVASC) 10 MG tablet Take 10 mg by mouth daily.    Marland Kitchen atorvastatin (LIPITOR) 40 MG tablet Take 40 mg by mouth daily at 12 noon.    . calcium carbonate (TUMS - DOSED IN MG ELEMENTAL CALCIUM) 500 MG chewable tablet Chew 1 tablet by mouth as needed for indigestion or heartburn.    . carboxymethylcellulose (REFRESH PLUS) 0.5 % SOLN Place 1-2 drops into both eyes 3 (three) times daily as needed (for dry eyes).    . citalopram (CELEXA) 40 MG tablet Take 40 mg by mouth daily.     . clopidogrel (PLAVIX) 75 MG tablet Take 75 mg by mouth daily.     . colchicine 0.6 MG tablet Take by  mouth.    . diclofenac sodium (VOLTAREN) 1 % GEL Apply 1 g topically 2 (two) times daily as needed (for  pain.).    Marland Kitchen diphenhydrAMINE (BENADRYL) 25 MG tablet Take 2 tablets (50 mg total) by mouth as directed. Take Friday morning 04/05/17 before procedure. (Patient taking differently: Take 50 mg by mouth as directed. Take Wednesday morning 6/20 prior to coming to hospital) 2 tablet 0  . docusate sodium (STOOL SOFTENER) 100 MG capsule Take 200 mg by mouth daily.     . fluticasone (FLONASE) 50 MCG/ACT nasal spray Place 2 sprays into both nostrils daily.     . GNP ALLERGY 25 MG tablet Take 25 mg by mouth at bedtime.   0  . hydroxypropyl methylcellulose / hypromellose (ISOPTO TEARS / GONIOVISC) 2.5 % ophthalmic solution Place 1-2 drops into both eyes 4 (four) times daily as needed for dry eyes.    . insulin glargine (LANTUS) 100 UNIT/ML injection Inject 15 Units into the skin daily.     Marland Kitchen lidocaine-prilocaine (EMLA) cream Apply 1 application topically as needed (prior to dialysis).    Marland Kitchen loratadine (CLARITIN) 10 MG tablet Take 10 mg by mouth at bedtime.    . Misc. Devices [facility]) MISC Frequency:PHARMDIR   Dosage:0.0     Instructions:  Note:Dx: Deconditioning with orthostatic hypotension Dose: 1    . nystatin (MYCOSTATIN/NYSTOP) powder Apply 1 g topically daily as needed (for skin rashes.).     Marland Kitchen OXYGEN Inhale 2 L into the lungs at bedtime.    . polyethylene glycol powder (GLYCOLAX/MIRALAX) powder Take 17 g by mouth daily. With coffee    . predniSONE (DELTASONE) 50 MG tablet Take 1 tablet 04/04/17 at 6PM, Take 1 tablet 04/04/17 at bedtime, and take 1 tablet 04/05/17 in AM 3 tablet 0  . ranitidine (ZANTAC) 150 MG tablet Take 150 mg by mouth daily.   0  . RENVELA 800 MG tablet Take 800 mg by mouth 3 (three) times daily with meals.     Marland Kitchen UNIFINE PENTIPS 31G X 6 MM MISC USE WITH VICTOZA ONCE A DAY  1  . VICTOZA 18 MG/3ML SOPN Inject 0.3 mL (1.8 mg total) under the skin daily.  12  . oxyCODONE-acetaminophen  (ROXICET) 5-325 MG tablet Take 1-2 tablets by mouth every 6 (six) hours as needed for moderate pain or severe pain. (Patient not taking: Reported on 06/24/2017) 40 tablet 0   Current Facility-Administered Medications on File Prior to Visit  Medication Dose Route Frequency Provider Last Rate Last Dose  . HYDROmorphone (DILAUDID) injection 1 mg  1 mg Intravenous Q1H PRN Schnier, Dolores Lory, MD      . ondansetron [facility]) injection 4 mg  4 mg Intravenous Once Schnier, Dolores Lory, MD       There are no Patient Instructions on file for this visit. No follow-ups on file.  KIMBERLY A STEGMAYER, PA-C

## 2018-11-23 ENCOUNTER — Emergency Department: Payer: Medicare Other

## 2018-11-23 ENCOUNTER — Other Ambulatory Visit: Payer: Self-pay

## 2018-11-23 ENCOUNTER — Encounter: Payer: Self-pay | Admitting: Emergency Medicine

## 2018-11-23 ENCOUNTER — Emergency Department
Admission: EM | Admit: 2018-11-23 | Discharge: 2018-11-23 | Disposition: A | Discharge status: Home / Self Care | Payer: Medicare Other | Attending: Emergency Medicine | Admitting: Emergency Medicine

## 2018-11-23 DIAGNOSIS — Z794 Long term (current) use of insulin: Secondary | ICD-10-CM | POA: Insufficient documentation

## 2018-11-23 DIAGNOSIS — Z79899 Other long term (current) drug therapy: Secondary | ICD-10-CM | POA: Insufficient documentation

## 2018-11-23 DIAGNOSIS — F419 Anxiety disorder, unspecified: Secondary | ICD-10-CM | POA: Insufficient documentation

## 2018-11-23 DIAGNOSIS — S0990XA Unspecified injury of head, initial encounter: Secondary | ICD-10-CM | POA: Insufficient documentation

## 2018-11-23 DIAGNOSIS — Z7902 Long term (current) use of antithrombotics/antiplatelets: Secondary | ICD-10-CM | POA: Diagnosis not present

## 2018-11-23 DIAGNOSIS — Z8673 Personal history of transient ischemic attack (TIA), and cerebral infarction without residual deficits: Secondary | ICD-10-CM | POA: Insufficient documentation

## 2018-11-23 DIAGNOSIS — I2510 Atherosclerotic heart disease of native coronary artery without angina pectoris: Secondary | ICD-10-CM | POA: Diagnosis not present

## 2018-11-23 DIAGNOSIS — Z992 Dependence on renal dialysis: Secondary | ICD-10-CM | POA: Insufficient documentation

## 2018-11-23 DIAGNOSIS — Y929 Unspecified place or not applicable: Secondary | ICD-10-CM | POA: Insufficient documentation

## 2018-11-23 DIAGNOSIS — Y9389 Activity, other specified: Secondary | ICD-10-CM | POA: Insufficient documentation

## 2018-11-23 DIAGNOSIS — N186 End stage renal disease: Secondary | ICD-10-CM | POA: Diagnosis not present

## 2018-11-23 DIAGNOSIS — J449 Chronic obstructive pulmonary disease, unspecified: Secondary | ICD-10-CM | POA: Insufficient documentation

## 2018-11-23 DIAGNOSIS — I509 Heart failure, unspecified: Secondary | ICD-10-CM | POA: Insufficient documentation

## 2018-11-23 DIAGNOSIS — Y828 Other medical devices associated with adverse incidents: Secondary | ICD-10-CM | POA: Diagnosis not present

## 2018-11-23 DIAGNOSIS — Y998 Other external cause status: Secondary | ICD-10-CM | POA: Insufficient documentation

## 2018-11-23 DIAGNOSIS — W01198A Fall on same level from slipping, tripping and stumbling with subsequent striking against other object, initial encounter: Secondary | ICD-10-CM | POA: Diagnosis not present

## 2018-11-23 DIAGNOSIS — E1122 Type 2 diabetes mellitus with diabetic chronic kidney disease: Secondary | ICD-10-CM | POA: Diagnosis not present

## 2018-11-23 DIAGNOSIS — Z85528 Personal history of other malignant neoplasm of kidney: Secondary | ICD-10-CM | POA: Diagnosis not present

## 2018-11-23 DIAGNOSIS — W19XXXA Unspecified fall, initial encounter: Secondary | ICD-10-CM

## 2018-11-23 DIAGNOSIS — T82868A Thrombosis of vascular prosthetic devices, implants and grafts, initial encounter: Secondary | ICD-10-CM | POA: Diagnosis not present

## 2018-11-23 DIAGNOSIS — Z87891 Personal history of nicotine dependence: Secondary | ICD-10-CM | POA: Diagnosis not present

## 2018-11-23 DIAGNOSIS — I132 Hypertensive heart and chronic kidney disease with heart failure and with stage 5 chronic kidney disease, or end stage renal disease: Secondary | ICD-10-CM | POA: Insufficient documentation

## 2018-11-23 DIAGNOSIS — T82898A Other specified complication of vascular prosthetic devices, implants and grafts, initial encounter: Secondary | ICD-10-CM

## 2018-11-23 LAB — CBC WITH DIFFERENTIAL/PLATELET
Abs Immature Granulocytes: 0.04 10*3/uL (ref 0.00–0.07)
Basophils Absolute: 0.1 10*3/uL (ref 0.0–0.1)
Basophils Relative: 1 %
Eosinophils Absolute: 0.1 10*3/uL (ref 0.0–0.5)
Eosinophils Relative: 1 %
HCT: 34.6 % — ABNORMAL LOW (ref 36.0–46.0)
Hemoglobin: 10.9 g/dL — ABNORMAL LOW (ref 12.0–15.0)
Immature Granulocytes: 0 %
Lymphocytes Relative: 15 %
Lymphs Abs: 1.5 10*3/uL (ref 0.7–4.0)
MCH: 29.2 pg (ref 26.0–34.0)
MCHC: 31.5 g/dL (ref 30.0–36.0)
MCV: 92.8 fL (ref 80.0–100.0)
Monocytes Absolute: 0.9 10*3/uL (ref 0.1–1.0)
Monocytes Relative: 9 %
Neutro Abs: 7.5 10*3/uL (ref 1.7–7.7)
Neutrophils Relative %: 74 %
Platelets: 270 10*3/uL (ref 150–400)
RBC: 3.73 MIL/uL — ABNORMAL LOW (ref 3.87–5.11)
RDW: 14.8 % (ref 11.5–15.5)
WBC: 10.1 10*3/uL (ref 4.0–10.5)
nRBC: 0 % (ref 0.0–0.2)

## 2018-11-23 LAB — BASIC METABOLIC PANEL
Anion gap: 16 — ABNORMAL HIGH (ref 5–15)
BUN: 61 mg/dL — ABNORMAL HIGH (ref 8–23)
CO2: 24 mmol/L (ref 22–32)
Calcium: 8.4 mg/dL — ABNORMAL LOW (ref 8.9–10.3)
Chloride: 93 mmol/L — ABNORMAL LOW (ref 98–111)
Creatinine, Ser: 12.07 mg/dL — ABNORMAL HIGH (ref 0.44–1.00)
GFR calc Af Amer: 3 mL/min — ABNORMAL LOW (ref >60)
GFR calc non Af Amer: 3 mL/min — ABNORMAL LOW (ref >60)
Glucose, Bld: 162 mg/dL — ABNORMAL HIGH (ref 70–99)
Potassium: 5 mmol/L (ref 3.5–5.1)
Sodium: 133 mmol/L — ABNORMAL LOW (ref 135–145)

## 2018-11-23 NOTE — Discharge Instructions (Addendum)
Please return to the hospital tomorrow morning at 8 AM to the main hospital registration, for your declotting procedure.  Please do not eat or drink after midnight tonight.

## 2018-11-23 NOTE — ED Provider Notes (Signed)
[facility] Emergency Department Provider Note  Time seen: 1:51 PM  I have reviewed the triage vital signs and the nursing notes.   HISTORY  Chief Complaint Fall and Vascular Access Problem    HPI [name] is a 62 y.o. female with a past medical history of anemia, anxiety, COPD, ESRD on hemodialysis Tuesday/Thursday/Saturday, presents to the emergency department with 2 complaints.  First complaint is the patient had a fall 1 week ago hitting the back of her head on a glass table.  Denies LOC but continues to have pain to the back of the head and a mild headache.  Second complaint is patient went to dialysis yesterday and they were unable to access her left upper extremity fistula because it had clotted.  Patient was told to come to the emergency department so she came today for evaluation.  Patient denies any nausea vomiting.  Denies any difficulty breathing.   Past Medical History:  Diagnosis Date  . Anemia   . Anxiety   . Arthritis   . Cancer [facility])    Left Kidney Cancer  . CHF (congestive heart failure) ([place])   . Chronic kidney disease   . COPD (chronic obstructive pulmonary disease) (HCC)    Use Oxygen at bedtime  . Coronary artery disease   . Diabetes mellitus without complication ([place])   . Dialysis patient ([place])    Tues, Thurs, Sat  . Dyspnea    with exertion  . GERD (gastroesophageal reflux disease)   . Gout   . History of kidney stones   . Hypertension   . Neuropathy   . Peripheral vascular disease ([place])   . Sleep apnea    OSA--USE C-PAP  . Stroke [facility]) 2004    Patient Active Problem List   Diagnosis Date Noted  . Pain in joint, upper arm 07/16/2018  . Left arm pain 07/16/2018  . 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 Complication of renal dialysis device 05/06/2017  . Abnormal stress test   . Cerebrovascular accident, old 06/21/2014  . CAFL (chronic airflow limitation) ([place]) 04/09/2014  . Gout 02/16/2014  . BP (high blood pressure)  02/16/2014  . Diabetes ([place]) 09/28/2013  . Dermatophytoses 09/28/2013  . ESRD on dialysis ([place]) 09/28/2013  . Biliary calculi 05/03/2011  . Idiopathic localized osteoarthropathy 02/12/2011    Past Surgical History:  Procedure Laterality Date  . A/V FISTULAGRAM Left 04/26/2017   Procedure: A/V Fistulagram;  Surgeon: Katha Cabal, MD;  Location: [place] CV LAB;  Service: Cardiovascular;  Laterality: Left;  . ABDOMINAL HYSTERECTOMY    . CARDIAC CATHETERIZATION    . DIALYSIS/PERMA CATHETER INSERTION N/A 01/31/2017   Procedure: Dialysis/Perma Catheter Insertion;  Surgeon: Algernon Huxley, MD;  Location: [place] CV LAB;  Service: Cardiovascular;  Laterality: N/A;  . DIALYSIS/PERMA CATHETER INSERTION N/A 02/21/2017   Procedure: Dialysis/Perma Catheter Insertion;  Surgeon: Algernon Huxley, MD;  Location: [place] CV LAB;  Service: Cardiovascular;  Laterality: N/A;  . DIALYSIS/PERMA CATHETER INSERTION N/A 03/19/2017   Procedure: Dialysis/Perma Catheter Insertion;  Surgeon: Katha Cabal, MD;  Location: [place] CV LAB;  Service: Cardiovascular;  Laterality: N/A;  . DIALYSIS/PERMA CATHETER REMOVAL N/A 07/19/2017   Procedure: DIALYSIS/PERMA CATHETER REMOVAL;  Surgeon: Katha Cabal, MD;  Location: [place] CV LAB;  Service: Cardiovascular;  Laterality: N/A;  . EYE SURGERY Bilateral    Cataract Extraction with IOL  . KIDNEY SURGERY Left    Partial Nephrectomy  . LEFT HEART CATH AND  CORONARY ANGIOGRAPHY N/A 04/05/2017   Procedure: Left Heart Cath and Coronary Angiography;  Surgeon: Wellington Hampshire, MD;  Location: [place] CV LAB;  Service: Cardiovascular;  Laterality: N/A;  . PERIPHERAL VASCULAR CATHETERIZATION N/A 01/23/2016   Procedure: Dialysis/Perma Catheter Insertion;  Surgeon: Algernon Huxley, MD;  Location: [place] CV LAB;  Service: Cardiovascular;  Laterality: N/A;  . PERIPHERAL VASCULAR CATHETERIZATION Left 10/01/2016   Procedure: A/V  Shuntogram/Fistulagram;  Surgeon: Algernon Huxley, MD;  Location: [place] CV LAB;  Service: Cardiovascular;  Laterality: Left;  . UPPER EXTREMITY VENOGRAPHY Bilateral 04/26/2017   Procedure: Upper Extremity Venography;  Surgeon: Katha Cabal, MD;  Location: [place] CV LAB;  Service: Cardiovascular;  Laterality: Bilateral;  . VASCULAR ACCESS DEVICE INSERTION Left 05/22/2017   Procedure: INSERTION OF HERO VASCULAR ACCESS DEVICE;  Surgeon: Katha Cabal, MD;  Location: [facility] ORS;  Service: Vascular;  Laterality: Left;    Prior to Admission medications   Medication Sig Start Date End Date Taking? Authorizing Provider  acetaminophen (TYLENOL 8 HOUR) 650 MG CR tablet Take 650 mg by mouth every 8 (eight) hours as needed for pain.    [provider]  albuterol (PROVENTIL HFA;VENTOLIN HFA) 108 (90 Base) MCG/ACT inhaler Inhale 2 puffs into the lungs every 6 (six) hours as needed for wheezing or shortness of breath.    [provider]  allopurinol (ZYLOPRIM) 100 MG tablet Take 150 mg by mouth Every Tuesday,Thursday,and Saturday with dialysis.     [provider]  amitriptyline (ELAVIL) 25 MG tablet Take 25 mg by mouth at bedtime.  02/16/14   [provider]  amLODipine (NORVASC) 10 MG tablet Take 10 mg by mouth daily.    [provider]  atorvastatin (LIPITOR) 40 MG tablet Take 40 mg by mouth daily at 12 noon. 09/07/16   [provider]  calcium carbonate (TUMS - DOSED IN MG ELEMENTAL CALCIUM) 500 MG chewable tablet Chew 1 tablet by mouth as needed for indigestion or heartburn.    [provider]  carboxymethylcellulose (REFRESH PLUS) 0.5 % SOLN Place 1-2 drops into both eyes 3 (three) times daily as needed (for dry eyes).    [provider]  citalopram (CELEXA) 40 MG tablet Take 40 mg by mouth daily.  02/16/14   [provider]  clopidogrel (PLAVIX) 75 MG tablet Take 75 mg by mouth daily.  02/16/14   [provider]  colchicine 0.6 MG tablet Take by mouth. 03/31/18 03/31/19  [provider]  diclofenac sodium (VOLTAREN) 1 % GEL Apply 1 g topically 2 (two) times daily as needed (for pain.).    [provider]  diphenhydrAMINE (BENADRYL) 25 MG tablet Take 2 tablets (50 mg total) by mouth as directed. Take Friday morning 04/05/17 before procedure. Patient taking differently: Take 50 mg by mouth as directed. Take Wednesday morning 6/20 prior to coming to hospital 04/02/17   Wende Bushy, MD  docusate sodium (STOOL SOFTENER) 100 MG capsule Take 200 mg by mouth daily.  02/16/14   [provider]  fluticasone (FLONASE) 50 MCG/ACT nasal spray Place 2 sprays into both nostrils daily.  02/16/14   [provider]  GNP ALLERGY 25 MG tablet Take 25 mg by mouth at bedtime.  09/26/16   [provider]  hydroxypropyl methylcellulose / hypromellose (ISOPTO TEARS / GONIOVISC) 2.5 % ophthalmic solution Place 1-2 drops into both eyes 4 (four) times daily as needed for dry eyes.    [provider]  insulin glargine (LANTUS) 100 UNIT/ML injection Inject 15 Units into the skin daily.  02/16/14   [provider]  lidocaine-prilocaine (EMLA) cream Apply 1 application topically as needed (prior to dialysis).    [provider]  loratadine (CLARITIN) 10 MG tablet Take 10 mg by mouth at bedtime.    [provider]  Misc. Devices [facility]) MISC Frequency:PHARMDIR   Dosage:0.0     Instructions:  Note:Dx: Deconditioning with orthostatic hypotension Dose: 1 07/02/12   [provider]  nystatin (MYCOSTATIN/NYSTOP) powder Apply 1 g topically daily as needed (for skin rashes.).  06/26/16   [provider]  oxyCODONE-acetaminophen (ROXICET) 5-325 MG tablet Take 1-2 tablets by mouth every 6 (six) hours as needed for moderate pain or severe pain. Patient not taking: Reported on 06/24/2017 05/22/17   Schnier, Dolores Lory, MD  OXYGEN Inhale 2 L into  the lungs at bedtime.    [provider]  polyethylene glycol powder (GLYCOLAX/MIRALAX) powder Take 17 g by mouth daily. With coffee 06/12/14   [provider]  predniSONE (DELTASONE) 50 MG tablet Take 1 tablet 04/04/17 at 6PM, Take 1 tablet 04/04/17 at bedtime, and take 1 tablet 04/05/17 in AM 04/02/17   Wende Bushy, MD  ranitidine (ZANTAC) 150 MG tablet Take 150 mg by mouth daily.  09/26/16   [provider]  RENVELA 800 MG tablet Take 800 mg by mouth 3 (three) times daily with meals.  04/16/17   [provider]  UNIFINE PENTIPS 31G X 6 MM MISC USE WITH VICTOZA ONCE A DAY 11/10/18   [provider]  VICTOZA 18 MG/3ML SOPN Inject 0.3 mL (1.8 mg total) under the skin daily. 04/11/17   [provider]    Allergies  Allergen Reactions  . Iodine Rash  . Enalapril Other (See Comments)    TONGUE SWELLS/GOUT  . Iodinated Diagnostic Agents Other (See Comments)    BREAK OUT IN WHELPS      Family History  Problem Relation Age of Onset  . Hypertension Mother   . Heart failure Mother   . Diabetes Father   . Heart attack Father   . Lung cancer Maternal Aunt   . Cancer Maternal Aunt     Social History Social History   Tobacco Use  . Smoking status: Former Research scientist (life sciences)  . Smokeless tobacco: Never Used  Substance Use Topics  . Alcohol use: No  . Drug use: No    Review of Systems Constitutional: Negative for fever. Cardiovascular: Negative for chest pain. Respiratory: Negative for shortness of breath. Gastrointestinal: Negative for abdominal pain, vomiting  Genitourinary: Negative for urinary compaints Musculoskeletal: Negative for musculoskeletal complaints Skin: Negative for skin complaints  Neurological: Mild headache All other ROS negative  ____________________________________________   PHYSICAL EXAM:  VITAL SIGNS: ED Triage Vitals  Enc Vitals Group     BP 11/23/18 1115 (!) 157/68     Pulse Rate 11/23/18 1115 92     Resp 11/23/18  1115 14     Temp 11/23/18 1115 98.5 F (36.9 C)     Temp Source 11/23/18 1115 Oral     SpO2 11/23/18 1115 91 %     Weight 11/23/18 1116 276 lb (125.2 kg)     Height 11/23/18 1116 5\' 6"  (1.676 m)     Head Circumference --      Peak Flow --      Pain Score 11/23/18 1121 8     Pain Loc --      Pain Edu? --  Excl. in [place]? --    Constitutional: Alert and oriented. Well appearing and in no distress. Eyes: Normal exam ENT   Head: Normocephalic mildly tender to the occipital scalp although no hematoma laceration or abrasion noted.   Mouth/Throat: Mucous membranes are moist. Cardiovascular: Normal rate, regular rhythm. No murmur Respiratory: Normal respiratory effort without tachypnea nor retractions. Breath sounds are clear Gastrointestinal: Soft and nontender. No distention.   Musculoskeletal: Patient does have mild tenderness and warmth to left upper extremity overlying the left AV fistula.  No thrill palpated.  Appears to be neurovascular intact distally.  No C-spine tenderness. Neurologic:  Normal speech and language. No gross focal neurologic deficits  Skin:  Skin is warm, dry and intact.  Psychiatric: Mood and affect are normal.  ____________________________________________    EKG  EKG viewed and interpreted by myself shows a sinus rhythm at 92 bpm with a narrow QRS, normal axis, normal intervals besides slight PR prolongation nonspecific ST changes.  ____________________________________________    RADIOLOGY  CT shows no acute abnormality  ____________________________________________   INITIAL IMPRESSION / ASSESSMENT AND PLAN / ED COURSE  Pertinent labs & imaging results that were available during my care of the patient were reviewed by me and considered in my medical decision making (see chart for details).  Patient presents to the emergency department after a fall and head injury 1 week ago continues to have a mild headache.  She also last had dialysis on  Thursday due to a clotted left upper extremity fistula.  I discussed the patient with vascular surgery (Dr. Lorenso Courier), they state as [place] as the patient is able to wait for dialysis they will see the patient 8 AM tomorrow for a declotting procedure in the hospital.  We will discussed the patient with nephrology.  Reassuringly patient's labs are largely at baseline potassium is 5.0.  No significant EKG changes.  We will obtain CT imaging of the head given the patient's continued headache after head injury 1 week ago as a precaution.  CT scan shows no acute abnormality.  I discussed the patient with Dr. Candiss Norse of nephrology, who believes the patient is safe for discharge home from a dialysis standpoint.  I discussed patient with Dr. Lorenso Courier of vascular surgery, patient will be declotted tomorrow at 8 AM and then we will have patient follow-up for dialysis.  Patient agreeable to plan of care.  I discussed return precautions for any vomiting or trouble breathing.  ____________________________________________   FINAL CLINICAL IMPRESSION(S) / ED DIAGNOSES  Head injury Clotted AV fistula    Harvest Dark, MD 11/23/18 [phone]

## 2018-11-23 NOTE — ED Notes (Signed)
ED Provider at bedside. 

## 2018-11-23 NOTE — ED Triage Notes (Signed)
Pt to ED via POV c/o fall 1 week ago. Pt fell through a glass table. Pt also states that she was unable to have dialysis on Saturday because her access lept clogging up. Pt family member states that the dialysis center is supposed to be called them with an appt to see vascular about this. Pt states that the arm is swollen, states that this started yesterday and has continued to swell. Pt had full treatment on Thursday but was unable get any treatment yesterday. Pt is in NAD at this time.

## 2018-11-23 NOTE — ED Notes (Signed)
Pt taken to POV in wheelchair with S/O. VSS. NAD. Discharge instructions, RX and follow up reviewed. All questions and concerns addressed.

## 2018-11-23 NOTE — ED Triage Notes (Signed)
Called lab spoke with Gwynn, aware that assistance is needed with lab draw.

## 2018-11-24 ENCOUNTER — Ambulatory Visit
Admission: RE | Admit: 2018-11-24 | Discharge: 2018-11-24 | Disposition: A | Discharge status: Home / Self Care | Payer: Medicare Other | Source: Ambulatory Visit | Attending: Vascular Surgery | Admitting: Vascular Surgery

## 2018-11-24 ENCOUNTER — Encounter: Payer: Self-pay | Admitting: *Deleted

## 2018-11-24 ENCOUNTER — Encounter
Admission: RE | Disposition: A | Discharge status: Home / Self Care | Payer: Self-pay | Source: Ambulatory Visit | Attending: Vascular Surgery

## 2018-11-24 ENCOUNTER — Other Ambulatory Visit: Payer: Self-pay

## 2018-11-24 DIAGNOSIS — E1122 Type 2 diabetes mellitus with diabetic chronic kidney disease: Secondary | ICD-10-CM

## 2018-11-24 DIAGNOSIS — K219 Gastro-esophageal reflux disease without esophagitis: Secondary | ICD-10-CM | POA: Insufficient documentation

## 2018-11-24 DIAGNOSIS — E1140 Type 2 diabetes mellitus with diabetic neuropathy, unspecified: Secondary | ICD-10-CM | POA: Diagnosis not present

## 2018-11-24 DIAGNOSIS — I509 Heart failure, unspecified: Secondary | ICD-10-CM | POA: Diagnosis not present

## 2018-11-24 DIAGNOSIS — Z992 Dependence on renal dialysis: Secondary | ICD-10-CM

## 2018-11-24 DIAGNOSIS — M109 Gout, unspecified: Secondary | ICD-10-CM | POA: Insufficient documentation

## 2018-11-24 DIAGNOSIS — Z87891 Personal history of nicotine dependence: Secondary | ICD-10-CM | POA: Insufficient documentation

## 2018-11-24 DIAGNOSIS — N186 End stage renal disease: Secondary | ICD-10-CM | POA: Insufficient documentation

## 2018-11-24 DIAGNOSIS — G4733 Obstructive sleep apnea (adult) (pediatric): Secondary | ICD-10-CM | POA: Diagnosis not present

## 2018-11-24 DIAGNOSIS — Z905 Acquired absence of kidney: Secondary | ICD-10-CM | POA: Insufficient documentation

## 2018-11-24 DIAGNOSIS — Z91041 Radiographic dye allergy status: Secondary | ICD-10-CM | POA: Insufficient documentation

## 2018-11-24 DIAGNOSIS — T82868A Thrombosis of vascular prosthetic devices, implants and grafts, initial encounter: Secondary | ICD-10-CM

## 2018-11-24 DIAGNOSIS — Z888 Allergy status to other drugs, medicaments and biological substances status: Secondary | ICD-10-CM | POA: Diagnosis not present

## 2018-11-24 DIAGNOSIS — Z8673 Personal history of transient ischemic attack (TIA), and cerebral infarction without residual deficits: Secondary | ICD-10-CM | POA: Diagnosis not present

## 2018-11-24 DIAGNOSIS — M1990 Unspecified osteoarthritis, unspecified site: Secondary | ICD-10-CM | POA: Insufficient documentation

## 2018-11-24 DIAGNOSIS — Z8249 Family history of ischemic heart disease and other diseases of the circulatory system: Secondary | ICD-10-CM | POA: Insufficient documentation

## 2018-11-24 DIAGNOSIS — Y832 Surgical operation with anastomosis, bypass or graft as the cause of abnormal reaction of the patient, or of later complication, without mention of misadventure at the time of the procedure: Secondary | ICD-10-CM | POA: Insufficient documentation

## 2018-11-24 DIAGNOSIS — Z955 Presence of coronary angioplasty implant and graft: Secondary | ICD-10-CM | POA: Diagnosis not present

## 2018-11-24 DIAGNOSIS — I2510 Atherosclerotic heart disease of native coronary artery without angina pectoris: Secondary | ICD-10-CM | POA: Diagnosis not present

## 2018-11-24 DIAGNOSIS — E1151 Type 2 diabetes mellitus with diabetic peripheral angiopathy without gangrene: Secondary | ICD-10-CM | POA: Diagnosis not present

## 2018-11-24 DIAGNOSIS — I120 Hypertensive chronic kidney disease with stage 5 chronic kidney disease or end stage renal disease: Secondary | ICD-10-CM

## 2018-11-24 DIAGNOSIS — J449 Chronic obstructive pulmonary disease, unspecified: Secondary | ICD-10-CM | POA: Diagnosis not present

## 2018-11-24 DIAGNOSIS — Z90710 Acquired absence of both cervix and uterus: Secondary | ICD-10-CM | POA: Diagnosis not present

## 2018-11-24 DIAGNOSIS — I132 Hypertensive heart and chronic kidney disease with heart failure and with stage 5 chronic kidney disease, or end stage renal disease: Secondary | ICD-10-CM | POA: Insufficient documentation

## 2018-11-24 HISTORY — PX: PERIPHERAL VASCULAR THROMBECTOMY: CATH118306

## 2018-11-24 LAB — GLUCOSE, CAPILLARY
Glucose-Capillary: 103 mg/dL — ABNORMAL HIGH (ref 70–99)
Glucose-Capillary: 112 mg/dL — ABNORMAL HIGH (ref 70–99)
Glucose-Capillary: 142 mg/dL — ABNORMAL HIGH (ref 70–99)

## 2018-11-24 LAB — POTASSIUM: Potassium: 5.4 mmol/L — ABNORMAL HIGH (ref 3.5–5.1)

## 2018-11-24 SURGERY — PERIPHERAL VASCULAR THROMBECTOMY
Anesthesia: Moderate Sedation | Laterality: Left

## 2018-11-24 MED ORDER — HEPARIN SODIUM (PORCINE) 1000 UNIT/ML IJ SOLN
INTRAMUSCULAR | Status: DC | PRN
  Administered 2018-11-24: 4000 [IU] via INTRAVENOUS

## 2018-11-24 MED ORDER — FENTANYL CITRATE (PF) 100 MCG/2ML IJ SOLN
INTRAMUSCULAR | Status: AC
  Filled 2018-11-24: qty 2

## 2018-11-24 MED ORDER — CEFAZOLIN SODIUM-DEXTROSE 1-4 GM/50ML-% IV SOLN
1.0000 g | Freq: Once | INTRAVENOUS | Status: AC
  Administered 2018-11-24: 1 g via INTRAVENOUS

## 2018-11-24 MED ORDER — ONDANSETRON HCL 4 MG/2ML IJ SOLN: 4.0000 mg | Freq: Four times a day (QID) | INTRAMUSCULAR | Status: DC | PRN

## 2018-11-24 MED ORDER — HYDROMORPHONE HCL 1 MG/ML IJ SOLN: 1.0000 mg | Freq: Once | INTRAMUSCULAR | Status: DC | PRN

## 2018-11-24 MED ORDER — DIPHENHYDRAMINE HCL 50 MG/ML IJ SOLN
INTRAMUSCULAR | Status: AC
  Administered 2018-11-24: 50 mg via INTRAVENOUS
  Filled 2018-11-24: qty 1

## 2018-11-24 MED ORDER — LABETALOL HCL 5 MG/ML IV SOLN
INTRAVENOUS | Status: DC | PRN
  Administered 2018-11-24: 10 mg via INTRAVENOUS

## 2018-11-24 MED ORDER — HYDRALAZINE HCL 20 MG/ML IJ SOLN
10.0000 mg | Freq: Once | INTRAMUSCULAR | Status: AC
  Administered 2018-11-24: 10 mg via INTRAVENOUS

## 2018-11-24 MED ORDER — MIDAZOLAM HCL 2 MG/2ML IJ SOLN
INTRAMUSCULAR | Status: DC | PRN
  Administered 2018-11-24: 1 mg via INTRAVENOUS
  Administered 2018-11-24: 2 mg via INTRAVENOUS
  Administered 2018-11-24: 1 mg via INTRAVENOUS

## 2018-11-24 MED ORDER — ALTEPLASE 2 MG IJ SOLR
INTRAMUSCULAR | Status: DC | PRN
  Administered 2018-11-24: 4 mg

## 2018-11-24 MED ORDER — HEPARIN (PORCINE) IN NACL 1000-0.9 UT/500ML-% IV SOLN
INTRAVENOUS | Status: AC
  Filled 2018-11-24: qty 1000

## 2018-11-24 MED ORDER — DIPHENHYDRAMINE HCL 25 MG PO CAPS: 50.0000 mg | ORAL_CAPSULE | Freq: Once | ORAL | Status: AC

## 2018-11-24 MED ORDER — SODIUM CHLORIDE 0.9 % IV SOLN
INTRAVENOUS | Status: DC
  Administered 2018-11-24: 12:00:00 via INTRAVENOUS

## 2018-11-24 MED ORDER — METHYLPREDNISOLONE SODIUM SUCC 125 MG IJ SOLR
125.0000 mg | Freq: Once | INTRAMUSCULAR | Status: AC
  Administered 2018-11-24: 125 mg via INTRAVENOUS

## 2018-11-24 MED ORDER — LIDOCAINE-EPINEPHRINE (PF) 1 %-1:200000 IJ SOLN
INTRAMUSCULAR | Status: AC
  Filled 2018-11-24: qty 10

## 2018-11-24 MED ORDER — LABETALOL HCL 5 MG/ML IV SOLN
INTRAVENOUS | Status: AC
  Filled 2018-11-24: qty 4

## 2018-11-24 MED ORDER — DIPHENHYDRAMINE HCL 50 MG/ML IJ SOLN
50.0000 mg | Freq: Once | INTRAMUSCULAR | Status: AC
  Administered 2018-11-24: 50 mg via INTRAVENOUS

## 2018-11-24 MED ORDER — HEPARIN SODIUM (PORCINE) 1000 UNIT/ML IJ SOLN
INTRAMUSCULAR | Status: AC
  Filled 2018-11-24: qty 1

## 2018-11-24 MED ORDER — HYDRALAZINE HCL 20 MG/ML IJ SOLN
INTRAMUSCULAR | Status: AC
  Administered 2018-11-24: 10 mg via INTRAVENOUS
  Filled 2018-11-24: qty 1

## 2018-11-24 MED ORDER — SODIUM CHLORIDE FLUSH 0.9 % IV SOLN
INTRAVENOUS | Status: AC
  Filled 2018-11-24: qty 50

## 2018-11-24 MED ORDER — FAMOTIDINE 20 MG PO TABS
40.0000 mg | ORAL_TABLET | Freq: Once | ORAL | Status: AC
  Administered 2018-11-24: 40 mg via ORAL

## 2018-11-24 MED ORDER — ALTEPLASE 2 MG IJ SOLR
INTRAMUSCULAR | Status: AC
  Filled 2018-11-24: qty 4

## 2018-11-24 MED ORDER — MIDAZOLAM HCL 5 MG/5ML IJ SOLN
INTRAMUSCULAR | Status: AC
  Filled 2018-11-24: qty 5

## 2018-11-24 MED ORDER — FENTANYL CITRATE (PF) 100 MCG/2ML IJ SOLN
INTRAMUSCULAR | Status: DC | PRN
  Administered 2018-11-24: 50 ug via INTRAVENOUS
  Administered 2018-11-24 (×2): 25 ug via INTRAVENOUS

## 2018-11-24 MED ORDER — FAMOTIDINE 20 MG PO TABS
ORAL_TABLET | ORAL | Status: AC
  Administered 2018-11-24: 40 mg via ORAL
  Filled 2018-11-24: qty 2

## 2018-11-24 MED ORDER — METHYLPREDNISOLONE SODIUM SUCC 125 MG IJ SOLR
INTRAMUSCULAR | Status: AC
  Administered 2018-11-24: 125 mg via INTRAVENOUS
  Filled 2018-11-24: qty 2

## 2018-11-24 SURGICAL SUPPLY — 23 items
BALLN DORADO 8X100X80 (BALLOONS) ×2
BALLN DORADO7X100X80 (BALLOONS) ×2
BALLOON DORADO 8X100X80 (BALLOONS) ×1 IMPLANT
BALLOON DORADO7X100X80 (BALLOONS) ×1 IMPLANT
CANNULA 5F STIFF (CANNULA) ×2 IMPLANT
CATH BEACON 5 .035 40 KMP TP (CATHETERS) ×1 IMPLANT
CATH BEACON 5 .038 40 KMP TP (CATHETERS) ×1
CATH EMBOLECTOMY 5FR (BALLOONS) ×2 IMPLANT
CATH INDIGO CAT6 KIT (CATHETERS) ×2 IMPLANT
DEVICE PRESTO INFLATION (MISCELLANEOUS) ×2 IMPLANT
DRAPE BRACHIAL (DRAPES) ×2 IMPLANT
PACK ANGIOGRAPHY (CUSTOM PROCEDURE TRAY) ×2 IMPLANT
SET AVX THROMB ULT (MISCELLANEOUS) ×2 IMPLANT
SHEATH BRITE TIP 6FRX5.5 (SHEATH) ×4 IMPLANT
SHEATH BRITE TIP 7FRX5.5 (SHEATH) ×4 IMPLANT
STENT VIABAHN 8X150X120 (Permanent Stent) ×2 IMPLANT
STENT VIABAHN 8X15X120 7FR (Permanent Stent) ×2 IMPLANT
STENT VIABAHN 8X50X120 (Permanent Stent) ×2 IMPLANT
STENT VIABAHN5X120X8X (Permanent Stent) ×1 IMPLANT
SUT MNCRL AB 4-0 PS2 18 (SUTURE) ×2 IMPLANT
TOWEL OR 17X26 4PK STRL BLUE (TOWEL DISPOSABLE) ×2 IMPLANT
WIRE G 018X200 V18 (WIRE) ×2 IMPLANT
WIRE MAGIC TOR.035 180C (WIRE) ×4 IMPLANT

## 2018-11-24 NOTE — Op Note (Signed)
[facility]    OPERATIVE NOTE   PROCEDURE: 1.  Left arm HeRO graft cannulation under ultrasound guidance in both a retrograde and then antegrade fashion crossing 2.  Left arm shuntogram and central venogram 3.  Catheter directed thrombolysis with 4 mg of TPA delivered with the AngioJet AVX catheter 4.  Mechanical rheolytic thrombectomy to the HeRO graft with the AngioJet AVX catheter and with the penumbra cat 6 catheter to the more central portion of the graft 5.  Fogarty embolectomy for residual arterial plug 6.  Percutaneous transluminal angioplasty of arterial anastomosis with 7 mm diameter by 10 cm length high-pressure angioplasty balloon 7.  Percutaneous transluminal angioplasty of the mid and distal graft all the way into the grommet connecting the central venous portion with multiple inflations with a 7 mm diameter 10 cm length high-pressure angioplasty balloon 8.  Viabahn stent placement to the distal graft and the grommet connecting the central venous portion to the PTFE portion with 8 mm diameter by 15 cm length stent 9.  Viabahn stent placement to the mid and proximal graft with two 8 mm diameter Viabahn stents one being 15 cm and one being 5 cm  PRE-OPERATIVE DIAGNOSIS: 1. ESRD 2.  Thrombosed left arm HeRO graft  POST-OPERATIVE DIAGNOSIS: same as above   SURGEON: Leotis Pain, MD  ANESTHESIA: local with Moderate Conscious Sedation   ESTIMATED BLOOD LOSS: 100 cc  FINDING(S): 1. Thrombosed HeRO graft, opened with intervention  SPECIMEN(S):  None  CONTRAST: 60 cc  FLUORO TIME: 10.8 minutes  MODERATE CONSCIOUS SEDATION TIME: Approximately 65 minutes with 4 mg of Versed and 100 mcg of Fentanyl  INDICATIONS: Patient is a 62 y.o.female who presents with a thrombosed left arm HeRO graft.  The patient is scheduled for an attempted declot and shuntogram.  The patient is aware the risks include but are not limited to: bleeding, infection, thrombosis of  the cannulated access, and possible anaphylactic reaction to the contrast.  The patient is aware of the risks of the procedure and elects to proceed forward.  DESCRIPTION: After full informed written consent was obtained, the patient was brought back to the angiography suite and placed supine upon the angiography table.  The patient was connected to monitoring equipment.  Moderate conscious sedation was administered during a face to face encounter with the patient throughout the procedure with my supervision of the RN administering medicines and monitoring the patient's vital signs and mental status throughout from the start of the procedure until the patient was taken to the recovery room.  The  left arm was prepped and draped in the standard fashion for a percutaneous access intervention.  Under ultrasound guidance, the left arm HeRO graft was cannulated with a micropuncture needle under direct ultrasound guidance due to the pulseless nature of the graft in both an antegrade and a retrograde fashion crossing, and permanent images were performed.  The microwire was advanced and the needle was exchanged for the a microsheath.  I then upsized to a 6 Fr Sheath and imaging was performed.  Hand injections were completed to image the access including the central venous system. This demonstrated no flow within the HeRO graft.  Based on the images, this patient will need extensive treatment to salvage the graft. I then gave the patient 4000 units of intravenous heparin.  I then placed a Magic torque wire into the brachial artery from the retrograde sheath and into the right atrium from the antegrade sheath. 4 mg of TPA  were deployed throughout the entirety of the PTFE portion of the graft and into the brachial artery at the anastomosis. This was allowed to dwell for 10-15 minutes. Mechanical rheolytic thrombectomy was then performed throughout the PTFE portion of the HeRO graft and into the brachial artery at the  anastomosis. This uncovered thrombus and stenosis within the pTFE and a residual arterial plug was also seen at the arterial anastomosis. An attempt to clear the arterial plug was done with two passes of the Fogarty embolectomy balloon. Flow-limiting arterial plug remained, and I elected to treat this lesion with a 7 mm  angioplasty balloon.  There was several centimeters of the aneurysmal previous fistula to which the hero had been based upon and then there remained near occlusive thrombus at the junction of the new hero graft and the old fistula several centimeters away from the brachial artery.  After passes with the AngioJet AVX catheter both proximally and distally, there remained thrombus and stenosis near the grommet, in the mid and distal graft, near the arterial or antegrade sheath access site, and at the junction of the PTFE graft and the old fistula.  In the mid graft in the mid and distal graft multiple inflations with a 7 mm diameter by 10 cm length high-pressure angioplasty balloon inflated up to 16 and 18 atm for a minute were performed.  Following this, there remained multiple areas of residual thrombus and stenosis.  I elected to rely on the entirety of the PTFE portion of the graft all the way to the grommet and back to the old fistula with stents.  The initial retrograde sheath was removed and exchanged for a 7 French sheath with the original antegrade sheath.  An 8 mm diameter by 15 cm length Viabahn stent was deployed from the grommet back up to the midportion of the graft.  This was postdilated with a 7 mm balloon with excellent angiographic completion result and less than 10% residual stenosis.  The antegrade sheath was then removed with a 4-0 Monocryl pursestring suture placed on removal.  Ultrasound was used to visualize the access within the recently stented portion of the PTFE and this was accessed under ultrasound guidance without difficulty with a micropuncture needle.  A micropuncture  wire and sheath were placed and we upsized to a 7 French sheath in a retrograde fashion.  A 0.018 wire was then placed across the arterial anastomosis into the brachial artery.  An 8 mm diameter by 15 cm length Viabahn stent was deployed from the original fistula portion but away from the native brachial artery back in the midportion of the graft.  There is a slight gap between the 2 stents and an 8 mm diameter by 5 cm length via bond stent was then deployed.  8 mm diameter high-pressure balloons were used to post dilate the stents with excellent angiographic completion result and less than 10% residual stenosis.  The arterial outflow was seen to be intact distal to the graft on these images. The retrograde sheath was removed as a 4-0 Monocryl pursestring suture was placed. I then turned my attention to the thrombus in the distal graft. Mechanical rheolytic thrombectomy was performed again.    Based on the completion imaging, no further intervention is necessary.  The wire and balloon were removed from the sheath.  A 4-0 Monocryl purse-string suture was sewn around the sheath.  The sheath was removed while tying down the suture.  A sterile bandage was applied to the puncture site.  COMPLICATIONS: None  CONDITION: Stable   Leotis Pain 11/24/2018 2:32 PM   This note was created with Dragon Medical transcription system. Any errors in dictation are purely unintentional.

## 2018-11-24 NOTE — H&P (Signed)
[facility] Admission History & Physical  MRN : [id number]  [name] is a 62 y.o. ([birth year]) female who presents with chief complaint of No chief complaint on file. Marland Kitchen  History of Present Illness: I am asked to evaluate the patient by the dialysis center. The patient was sent here because they were unable to cannulate the access this morning. Furthermore the Center states there is no thrill or bruit. The patient states this is the first dialysis run to be missed. This problem is acute in onset and has been present for approximately 2 days. The patient is unaware of any other change.  Patient denies pain or tenderness overlying the access.  There is no pain with dialysis.  The patient denies hand pain or finger pain consistent with steal syndrome.   There have been past interventions or declots of this access.  The patient is not chronically hypotensive on dialysis.  Current Facility-Administered Medications  Medication Dose Route Frequency Provider Last Rate Last Dose  . 0.9 %  sodium chloride infusion   Intravenous Continuous Kris Hartmann, NP 10 mL/hr at 11/24/18 1222    . ceFAZolin (ANCEF) IVPB 1 g/50 mL premix  1 g Intravenous Once Eulogio Ditch E, NP      . diphenhydrAMINE (BENADRYL) 50 MG/ML injection           . diphenhydrAMINE (BENADRYL) capsule 50 mg  50 mg Oral Once Kris Hartmann, NP       Or  . diphenhydrAMINE (BENADRYL) injection 50 mg  50 mg Intravenous Once Eulogio Ditch E, NP      . HYDROmorphone (DILAUDID) injection 1 mg  1 mg Intravenous Once PRN Kris Hartmann, NP      . ondansetron (ZOFRAN) injection 4 mg  4 mg Intravenous Q6H PRN Kris Hartmann, NP        Past Medical History:  Diagnosis Date  . Anemia   . Anxiety   . Arthritis   . Cancer [facility])    Left Kidney Cancer  . CHF (congestive heart failure) ([place])   . Chronic kidney disease   . COPD (chronic obstructive pulmonary disease) ([facility])    Use Oxygen at bedtime  .  Coronary artery disease   . Diabetes mellitus without complication ([place])   . Dialysis patient ([place])    Tues, Thurs, Sat  . Dyspnea    with exertion  . GERD (gastroesophageal reflux disease)   . Gout   . History of kidney stones   . Hypertension   . Neuropathy   . Peripheral vascular disease ([place])   . Sleep apnea    OSA--USE C-PAP  . Stroke [facility]) 2004    Past Surgical History:  Procedure Laterality Date  . A/V FISTULAGRAM Left 04/26/2017   Procedure: A/V Fistulagram;  Surgeon: Katha Cabal, MD;  Location: [place] [facility];  Service: Cardiovascular;  Laterality: Left;  . ABDOMINAL HYSTERECTOMY    . CARDIAC CATHETERIZATION    . DIALYSIS/PERMA CATHETER INSERTION N/A 01/31/2017   Procedure: Dialysis/Perma Catheter Insertion;  Surgeon: Algernon Huxley, MD;  Location: [place] [facility];  Service: Cardiovascular;  Laterality: N/A;  . DIALYSIS/PERMA CATHETER INSERTION N/A 02/21/2017   Procedure: Dialysis/Perma Catheter Insertion;  Surgeon: Algernon Huxley, MD;  Location: [place] [facility];  Service: Cardiovascular;  Laterality: N/A;  . DIALYSIS/PERMA CATHETER INSERTION N/A 03/19/2017   Procedure: Dialysis/Perma Catheter Insertion;  Surgeon: Katha Cabal, MD;  Location: [place] [facility];  Service: Cardiovascular;  Laterality: N/A;  . DIALYSIS/PERMA CATHETER REMOVAL N/A 07/19/2017   Procedure: DIALYSIS/PERMA CATHETER REMOVAL;  Surgeon: Katha Cabal, MD;  Location: [place] [facility];  Service: Cardiovascular;  Laterality: N/A;  . EYE SURGERY Bilateral    Cataract Extraction with IOL  . KIDNEY SURGERY Left    Partial Nephrectomy  . LEFT HEART CATH AND CORONARY ANGIOGRAPHY N/A 04/05/2017   Procedure: Left Heart Cath and Coronary Angiography;  Surgeon: Wellington Hampshire, MD;  Location: [place] [facility];  Service: Cardiovascular;  Laterality: N/A;  . PERIPHERAL VASCULAR CATHETERIZATION N/A 01/23/2016   Procedure: Dialysis/Perma Catheter Insertion;  Surgeon: Algernon Huxley, MD;   Location: [place] [facility];  Service: Cardiovascular;  Laterality: N/A;  . PERIPHERAL VASCULAR CATHETERIZATION Left 10/01/2016   Procedure: A/V Shuntogram/Fistulagram;  Surgeon: Algernon Huxley, MD;  Location: [place] [facility];  Service: Cardiovascular;  Laterality: Left;  . UPPER EXTREMITY VENOGRAPHY Bilateral 04/26/2017   Procedure: Upper Extremity Venography;  Surgeon: Katha Cabal, MD;  Location: [place] [facility];  Service: Cardiovascular;  Laterality: Bilateral;  . VASCULAR ACCESS DEVICE INSERTION Left 05/22/2017   Procedure: INSERTION OF HERO VASCULAR ACCESS DEVICE;  Surgeon: Katha Cabal, MD;  Location: [facility];  Service: Vascular;  Laterality: Left;    Social History Social History   Tobacco Use  . Smoking status: Former Research scientist (life sciences)  . Smokeless tobacco: Never Used  Substance Use Topics  . Alcohol use: No  . Drug use: No    Family History Family History  Problem Relation Age of Onset  . Hypertension Mother   . Heart failure Mother   . Diabetes Father   . Heart attack Father   . Lung cancer Maternal Aunt   . Cancer Maternal Aunt     No family history of bleeding or clotting disorders, autoimmune disease or porphyria  Allergies  Allergen Reactions  . Iodine Rash  . Enalapril Other (See Comments)    TONGUE SWELLS/GOUT  . Iodinated Diagnostic Agents Other (See Comments)    BREAK OUT IN WHELPS       REVIEW OF SYSTEMS (Negative unless checked)  Constitutional: [] Weight loss  [] Fever  [] Chills Cardiac: [] Chest pain   [] Chest pressure   [x] Palpitations   [] Shortness of breath when laying flat   [] Shortness of breath at rest   [x] Shortness of breath with exertion. Vascular:  [] Pain in legs with walking   [] Pain in legs at rest   [] Pain in legs when laying flat   [] Claudication   [] Pain in feet when walking  [] Pain in feet at rest  [] Pain in feet when laying flat   [] History of DVT   [] Phlebitis   [x] Swelling in legs   [] Varicose veins   [] Non-healing  ulcers Pulmonary:   [] Uses home oxygen   [] Productive cough   [] Hemoptysis   [] Wheeze  [] COPD   [] Asthma Neurologic:  [] Dizziness  [] Blackouts   [] Seizures   [x] History of stroke   [] History of TIA  [] Aphasia   [] Temporary blindness   [] Dysphagia   [] Weakness or numbness in arms   [] Weakness or numbness in legs Musculoskeletal:  [x] Arthritis   [] Joint swelling   [] Joint pain   [] Low back pain Hematologic:  [] Easy bruising  [] Easy bleeding   [] Hypercoagulable state   [x] Anemic  [] Hepatitis Gastrointestinal:  [] Blood in stool   [] Vomiting blood  [] Gastroesophageal reflux/heartburn   [] Difficulty swallowing. Genitourinary:  [x] Chronic kidney disease   [] Difficult urination  [] Frequent urination  [] Burning with  urination   [] Blood in urine Skin:  [] Rashes   [] Ulcers   [] Wounds Psychological:  [] History of anxiety   []  History of major depression.  Physical Examination  Vitals:   11/24/18 1203  BP: (!) 161/79  Pulse: 89  Resp: 19  Temp: 97.6 F (36.4 C)  TempSrc: Oral  SpO2: 96%  Weight: 125.2 kg  Height: 5\' 6"  (1.676 m)   Body mass index is 44.55 kg/m. Gen: WD/WN, NAD. Morbidly obese Head: [place]/AT, No temporalis wasting.  Ear/Nose/Throat: Hearing grossly intact, nares w/o erythema or drainage, oropharynx w/o Erythema/Exudate,  Eyes: Conjunctiva clear, sclera non-icteric Neck: Trachea midline.  No JVD.  Pulmonary:  Good air movement, respirations not labored, no use of accessory muscles.  Cardiac: RRR, normal S1, S2. Vascular: no thrill left arm access Vessel Right Left  Radial Palpable Palpable   Musculoskeletal: M/S 5/5 throughout.  Extremities without ischemic changes.  No deformity or atrophy.  Neurologic: Sensation grossly intact in extremities.  Symmetrical.  Speech is fluent. Motor exam as listed above. Psychiatric: Judgment intact, Mood & affect appropriate for pt's clinical situation. Dermatologic: No rashes or ulcers noted.  No cellulitis or open wounds.    CBC Lab  Results  Component Value Date   WBC 10.1 11/23/2018   HGB 10.9 (L) 11/23/2018   HCT 34.6 (L) 11/23/2018   MCV 92.8 11/23/2018   PLT 270 11/23/2018    BMET    Component Value Date/Time   NA 133 (L) 11/23/2018 1223   NA 129 (L) 03/31/2015 0420   K 5.4 (H) 11/24/2018 0931   K 4.2 03/31/2015 0420   CL 93 (L) 11/23/2018 1223   CL 88 (L) 03/31/2015 0420   CO2 24 11/23/2018 1223   CO2 28 03/31/2015 0420   GLUCOSE 162 (H) 11/23/2018 1223   GLUCOSE 144 (H) 03/31/2015 0420   BUN 61 (H) 11/23/2018 1223   BUN 61 (H) 03/31/2015 0420   CREATININE 12.07 (H) 11/23/2018 1223   CREATININE 10.20 (H) 03/31/2015 0420   CALCIUM 8.4 (L) 11/23/2018 1223   CALCIUM 7.0 (LL) 03/31/2015 0420   GFRNONAA 3 (L) 11/23/2018 1223   GFRNONAA 4 (L) 03/31/2015 0420   GFRAA 3 (L) 11/23/2018 1223   GFRAA 4 (L) 03/31/2015 0420   Estimated Creatinine Clearance: 6.5 mL/min (A) (by C-G formula based on SCr of 12.07 mg/dL (H)).  COAG Lab Results  Component Value Date   INR 1.04 05/13/2017   INR 1.01 04/04/2017   INR 1.02 02/25/2017    Radiology Ct Head Wo Contrast  Result Date: 11/23/2018 CLINICAL DATA:  Fall 1 week ago with persistent headache. EXAM: CT HEAD WITHOUT CONTRAST TECHNIQUE: Contiguous axial images were obtained from the base of the skull through the vertex without intravenous contrast. COMPARISON:  08/04/2010 FINDINGS: Brain: Ventricles, cisterns and other CSF spaces are normal. There is mild to moderate chronic ischemic microvascular disease. There is no mass, mass effect, shift of midline structures or acute hemorrhage. No evidence of acute infarction. Vascular: No hyperdense vessel or unexpected calcification. Skull: Normal. Negative for fracture or focal lesion. Sinuses/Orbits: No acute finding. Other: None. IMPRESSION: No acute findings. Mild to moderate chronic ischemic microvascular disease. Electronically Signed   By: Marin Olp M.D.   On: 11/23/2018 14:16   Vas US Duplex Dialysis Access  (avf, Avg)  Result Date: 11/13/2018 DIALYSIS ACCESS Reason for Exam: Routine follow up. Access Site: Left Upper Extremity. History: H/O of multiple access sites;  05/22/17: Left HeRO graft placement with left internal jugular &          innominate vein PTAs and resection of aneurysmal brachial-cephalic          AVF;. Performing Technologist: Almira Coaster RVS Supporting Technologist: Blondell Reveal RT, RDMS, RVT  Examination Guidelines: A complete evaluation includes B-mode imaging, spectral Doppler, color Doppler, and power Doppler as needed of all accessible portions of each vessel. Unilateral testing is considered an integral part of a complete examination. Limited examinations for reoccurring indications may be performed as noted.  Findings:  +--------------------+---------+-----------------+-----------------------------+ AVG                    PSV   Flow Vol (mL/min)          Describe                                 (cm/s)                                                 +--------------------+---------+-----------------+-----------------------------+ Native artery inflow   176         1055                                     +--------------------+---------+-----------------+-----------------------------+ Arterial anastomosis   155                                                  +--------------------+---------+-----------------+-----------------------------+ Prox graft             140                                                  +--------------------+---------+-----------------+-----------------------------+ Mid graft              157                                                  +--------------------+---------+-----------------+-----------------------------+ Distal graft           142                                                  +--------------------+---------+-----------------+-----------------------------+ Venous anastomosis                                 not visualized due to  location            +--------------------+---------+-----------------+-----------------------------+ Venous outflow                                    not visualized due to                                                             location            +--------------------+---------+-----------------+-----------------------------+ Biphasic, antegrade flow in the left distal radial artery with velocities of 78cm/s at rest and 72cm/s during HDA compression.  Summary: Patent left HeRO graft with no significant focal velocity increase and normal Flow Volume. Mild dilatation of the access near the arterial anastomosis with minimally-occlusive, non-hemodynamically significant thrombus. Radial artery velocities are not consistent with a significant dialysis access steal.  *See table(s) above for measurements and observations.  Diagnosing physician: [name] MD Electronically signed by [name] MD on 11/13/2018 at 5:14:47 PM.   --------------------------------------------------------------------------------   Final     Assessment/Plan 1.  Complication dialysis device with thrombosis AV access:  Patient's left arm HeRO graft dialysis access is thrombosed. The patient will undergo thrombectomy using interventional techniques.  The risks and benefits were described to the patient.  All questions were answered.  The patient agrees to proceed with angiography and intervention. Potassium will be drawn to ensure that it is an appropriate level prior to performing thrombectomy. 2.  End-stage renal disease requiring hemodialysis:  Patient will continue dialysis therapy without further interruption if a successful thrombectomy is not achieved then catheter will be placed. Dialysis has already been arranged since the patient missed their previous session 3.  Hypertension:  Patient will continue medical  management; nephrology is following no changes in oral medications. 4.  Diabetes mellitus:  Glucose will be monitored and oral medications been held this morning once the patient has undergone the patient's procedure po intake will be reinitiated and again Accu-Cheks will be used to assess the blood glucose level and treat as needed. The patient will be restarted on the patient's usual hypoglycemic regime     Leotis Pain, MD  11/24/2018 12:47 PM

## 2018-12-22 ENCOUNTER — Ambulatory Visit (INDEPENDENT_AMBULATORY_CARE_PROVIDER_SITE_OTHER): Payer: Medicare Other | Admitting: Nurse Practitioner

## 2018-12-22 ENCOUNTER — Encounter (INDEPENDENT_AMBULATORY_CARE_PROVIDER_SITE_OTHER): Payer: Medicare Other

## 2018-12-24 ENCOUNTER — Other Ambulatory Visit (INDEPENDENT_AMBULATORY_CARE_PROVIDER_SITE_OTHER): Payer: Self-pay | Admitting: Vascular Surgery

## 2018-12-24 ENCOUNTER — Ambulatory Visit (INDEPENDENT_AMBULATORY_CARE_PROVIDER_SITE_OTHER): Payer: Medicare Other

## 2018-12-24 ENCOUNTER — Ambulatory Visit (INDEPENDENT_AMBULATORY_CARE_PROVIDER_SITE_OTHER): Payer: Medicare Other | Admitting: Nurse Practitioner

## 2018-12-24 ENCOUNTER — Encounter (INDEPENDENT_AMBULATORY_CARE_PROVIDER_SITE_OTHER): Payer: Self-pay | Admitting: Nurse Practitioner

## 2018-12-24 VITALS — BP 130/76 | HR 97 | Resp 18 | Ht 66.0 in | Wt 288.2 lb

## 2018-12-24 DIAGNOSIS — I120 Hypertensive chronic kidney disease with stage 5 chronic kidney disease or end stage renal disease: Secondary | ICD-10-CM | POA: Diagnosis not present

## 2018-12-24 DIAGNOSIS — N186 End stage renal disease: Secondary | ICD-10-CM

## 2018-12-24 DIAGNOSIS — E1122 Type 2 diabetes mellitus with diabetic chronic kidney disease: Secondary | ICD-10-CM

## 2018-12-24 DIAGNOSIS — Z9862 Peripheral vascular angioplasty status: Secondary | ICD-10-CM

## 2018-12-24 DIAGNOSIS — Z992 Dependence on renal dialysis: Secondary | ICD-10-CM | POA: Diagnosis not present

## 2018-12-24 DIAGNOSIS — I10 Essential (primary) hypertension: Secondary | ICD-10-CM

## 2018-12-24 DIAGNOSIS — T829XXS Unspecified complication of cardiac and vascular prosthetic device, implant and graft, sequela: Secondary | ICD-10-CM | POA: Diagnosis not present

## 2018-12-29 ENCOUNTER — Encounter (INDEPENDENT_AMBULATORY_CARE_PROVIDER_SITE_OTHER): Payer: Self-pay | Admitting: Nurse Practitioner

## 2018-12-29 NOTE — Progress Notes (Signed)
Subjective:    Patient ID: [name], female    DOB: 07-27-1956, 63 y.o.   MRN: [id number] Chief Complaint  Patient presents with  . Follow-up    HPI  [name] is a 63 y.o. female The patient returns to the office for followup status post intervention of the dialysis access left brachiocephalic hero graft. Following the intervention the access function has significantly improved, with better flow rates and improved KT/V. The patient has not been experiencing increased bleeding times following decannulation and the patient denies increased recirculation. The patient denies an increase in arm swelling. At the present time the patient denies hand pain.  The patient denies amaurosis fugax or recent TIA symptoms. There are no recent neurological changes noted. The patient denies claudication symptoms or rest pain symptoms. The patient denies history of DVT, PE or superficial thrombophlebitis. The patient denies recent episodes of angina or shortness of breath.   Patient underwent a hemodialysis duplex today which revealed a flow volume of 2362.  No areas of significant stenosis seen.  No hemodynamically significant velocity increases.     Past Medical History:  Diagnosis Date  . Anemia   . Anxiety   . Arthritis   . Cancer [facility])    Left Kidney Cancer  . CHF (congestive heart failure) ([place])   . Chronic kidney disease   . COPD (chronic obstructive pulmonary disease) (HCC)    Use Oxygen at bedtime  . Coronary artery disease   . Diabetes mellitus without complication ([place])   . Dialysis patient ([place])    Tues, Thurs, Sat  . Dyspnea    with exertion  . GERD (gastroesophageal reflux disease)   . Gout   . History of kidney stones   . Hypertension   . Neuropathy   . Peripheral vascular disease ([place])   . Sleep apnea    OSA--USE C-PAP  . Stroke [facility]) 2004    Past Surgical History:  Procedure Laterality Date  . A/V FISTULAGRAM Left 04/26/2017   Procedure: A/V  Fistulagram;  Surgeon: Katha Cabal, MD;  Location: [place] [facility];  Service: Cardiovascular;  Laterality: Left;  . ABDOMINAL HYSTERECTOMY    . CARDIAC CATHETERIZATION    . DIALYSIS/PERMA CATHETER INSERTION N/A 01/31/2017   Procedure: Dialysis/Perma Catheter Insertion;  Surgeon: Algernon Huxley, MD;  Location: [place] [facility];  Service: Cardiovascular;  Laterality: N/A;  . DIALYSIS/PERMA CATHETER INSERTION N/A 02/21/2017   Procedure: Dialysis/Perma Catheter Insertion;  Surgeon: Algernon Huxley, MD;  Location: [place] [facility];  Service: Cardiovascular;  Laterality: N/A;  . DIALYSIS/PERMA CATHETER INSERTION N/A 03/19/2017   Procedure: Dialysis/Perma Catheter Insertion;  Surgeon: Katha Cabal, MD;  Location: [place] [facility];  Service: Cardiovascular;  Laterality: N/A;  . DIALYSIS/PERMA CATHETER REMOVAL N/A 07/19/2017   Procedure: DIALYSIS/PERMA CATHETER REMOVAL;  Surgeon: Katha Cabal, MD;  Location: [place] [facility];  Service: Cardiovascular;  Laterality: N/A;  . EYE SURGERY Bilateral    Cataract Extraction with IOL  . KIDNEY SURGERY Left    Partial Nephrectomy  . LEFT HEART CATH AND CORONARY ANGIOGRAPHY N/A 04/05/2017   Procedure: Left Heart Cath and Coronary Angiography;  Surgeon: Wellington Hampshire, MD;  Location: [place] [facility];  Service: Cardiovascular;  Laterality: N/A;  . PERIPHERAL VASCULAR CATHETERIZATION N/A 01/23/2016   Procedure: Dialysis/Perma Catheter Insertion;  Surgeon: Algernon Huxley, MD;  Location: [place] [facility];  Service: Cardiovascular;  Laterality: N/A;  . PERIPHERAL VASCULAR CATHETERIZATION  Left 10/01/2016   Procedure: A/V Shuntogram/Fistulagram;  Surgeon: Algernon Huxley, MD;  Location: [place] [facility];  Service: Cardiovascular;  Laterality: Left;  . PERIPHERAL VASCULAR THROMBECTOMY Left 11/24/2018   Procedure: PERIPHERAL VASCULAR THROMBECTOMY;  Surgeon: Algernon Huxley, MD;  Location: [place] [facility];  Service: Cardiovascular;   Laterality: Left;  . UPPER EXTREMITY VENOGRAPHY Bilateral 04/26/2017   Procedure: Upper Extremity Venography;  Surgeon: Katha Cabal, MD;  Location: [place] [facility];  Service: Cardiovascular;  Laterality: Bilateral;  . VASCULAR ACCESS DEVICE INSERTION Left 05/22/2017   Procedure: INSERTION OF HERO VASCULAR ACCESS DEVICE;  Surgeon: Katha Cabal, MD;  Location: [facility];  Service: Vascular;  Laterality: Left;    Social History   Socioeconomic History  . Marital status: Married    Spouse name: Not on file  . Number of children: Not on file  . Years of education: Not on file  . Highest education level: Not on file  Occupational History  . Not on file  Social Needs  . Financial resource strain: Not on file  . Food insecurity:    Worry: Not on file    Inability: Not on file  . Transportation needs:    Medical: Not on file    Non-medical: Not on file  Tobacco Use  . Smoking status: Former Research scientist (life sciences)  . Smokeless tobacco: Never Used  Substance and Sexual Activity  . Alcohol use: No  . Drug use: No  . Sexual activity: Never  Lifestyle  . Physical activity:    Days per week: Not on file    Minutes per session: Not on file  . Stress: Not on file  Relationships  . Social connections:    Talks on phone: Not on file    Gets together: Not on file    Attends religious service: Not on file    Active member of club or organization: Not on file    Attends meetings of clubs or organizations: Not on file    Relationship status: Not on file  . Intimate partner violence:    Fear of current or ex partner: Not on file    Emotionally abused: Not on file    Physically abused: Not on file    Forced sexual activity: Not on file  Other Topics Concern  . Not on file  Social History Narrative  . Not on file    Family History  Problem Relation Age of Onset  . Hypertension Mother   . Heart failure Mother   . Diabetes Father   . Heart attack Father   . Lung cancer Maternal Aunt    . Cancer Maternal Aunt     Allergies  Allergen Reactions  . Iodine Rash  . Enalapril Other (See Comments)    TONGUE SWELLS/GOUT  . Iodinated Diagnostic Agents Other (See Comments)    BREAK OUT IN WHELPS       Review of Systems   Review of Systems: Negative Unless Checked Constitutional: [] Weight loss  [] Fever  [] Chills Cardiac: [] Chest pain   []  Atrial Fibrillation  [] Palpitations   [] Shortness of breath when laying flat   [] Shortness of breath with exertion. [] Shortness of breath at rest Vascular:  [] Pain in legs with walking   [] Pain in legs with standing [] Pain in legs when laying flat   [] Claudication    [] Pain in feet when laying flat    [] History of DVT   [] Phlebitis   [] Swelling in legs   [] Varicose veins   []   Non-healing ulcers Pulmonary:   [x] Uses home oxygen   [] Productive cough   [] Hemoptysis   [] Wheeze  [] COPD   [] Asthma Neurologic:  [] Dizziness   [] Seizures  [] Blackouts [] History of stroke   [] History of TIA  [] Aphasia   [] Temporary Blindness   [] Weakness or numbness in arm   [x] Weakness or numbness in leg Musculoskeletal:   [] Joint swelling   [] Joint pain   [] Low back pain  []  History of Knee Replacement [] Arthritis [] back Surgeries  []  Spinal Stenosis    Hematologic:  [] Easy bruising  [] Easy bleeding   [] Hypercoagulable state   [x] Anemic Gastrointestinal:  [] Diarrhea   [] Vomiting  [] Gastroesophageal reflux/heartburn   [] Difficulty swallowing. [] Abdominal pain Genitourinary:  [x] Chronic kidney disease   [] Difficult urination  [] Anuric   [] Blood in urine [] Frequent urination  [] Burning with urination   [] Hematuria Skin:  [] Rashes   [] Ulcers [] Wounds Psychological:  [] History of anxiety   []  History of major depression  []  Memory Difficulties     Objective:   Physical Exam  BP 130/76 (BP Location: Left Arm, Patient Position: Sitting)   Pulse 97   Resp 18   Ht 5\' 6"  (1.676 m)   Wt 288 lb 3.2 oz (130.7 kg)   BMI 46.52 kg/m   Gen: WD/WN, NAD Head: [place]/AT, No  temporalis wasting.  Ear/Nose/Throat: Hearing grossly intact, nares w/o erythema or drainage Eyes: PER, EOMI, sclera nonicteric.  Neck: Supple, no masses.  No JVD.  Pulmonary:  Good air movement, no use of accessory muscles. Patient on home O2 Cardiac: RRR Vascular: Strong bruit  Vessel Right Left  Radial Palpable Palpable   Gastrointestinal: soft, non-distended. No guarding/no peritoneal signs.  Musculoskeletal: M/S 5/5 throughout.  No deformity or atrophy.  Neurologic: Pain and light touch intact in extremities.  Symmetrical.  Speech is fluent. Motor exam as listed above. Psychiatric: Judgment intact, Mood & affect appropriate for pt's clinical situation. Dermatologic: No Venous rashes. No Ulcers Noted.  No changes consistent with cellulitis. Lymph : No Cervical lymphadenopathy, no lichenification or skin changes of chronic lymphedema.      Assessment & Plan:   1. ESRD on dialysis [facility]) Patient underwent a hemodialysis duplex today which revealed a flow volume of 2362.  No areas of significant stenosis seen.  No hemodynamically significant velocity increases.   Recommend:  The patient is doing well and currently has adequate dialysis access. The patient's dialysis center is not reporting any access issues. Flow pattern is stable when compared to the prior ultrasound.  The patient should have a duplex ultrasound of the dialysis access in 6 months. The patient will follow-up with me in the office after each ultrasound    - VAS [place] (AVF, AVG); Future  2. Type 2 diabetes mellitus with chronic kidney disease on chronic dialysis, unspecified whether [place] term insulin use ([place]) Continue hypoglycemic medications as already ordered, these medications have been reviewed and there are no changes at this time.  Hgb A1C to be monitored as already arranged by primary service   3. Essential hypertension Continue antihypertensive medications as already ordered,  these medications have been reviewed and there are no changes at this time.     Current Outpatient Medications on File Prior to Visit  Medication Sig Dispense Refill  . acetaminophen (TYLENOL 8 HOUR) 650 MG CR tablet Take 650 mg by mouth every 8 (eight) hours as needed for pain.    Marland Kitchen albuterol (PROVENTIL HFA;VENTOLIN HFA) 108 (90 Base) MCG/ACT inhaler Inhale 2 puffs into  the lungs every 6 (six) hours as needed for wheezing or shortness of breath.    . allopurinol (ZYLOPRIM) 100 MG tablet Take 150 mg by mouth Every Tuesday,Thursday,and Saturday with dialysis.     Marland Kitchen amitriptyline (ELAVIL) 25 MG tablet Take 25 mg by mouth at bedtime.     Marland Kitchen amLODipine (NORVASC) 10 MG tablet Take 10 mg by mouth daily.    Marland Kitchen atorvastatin (LIPITOR) 40 MG tablet Take 40 mg by mouth daily at 12 noon.    . calcium carbonate (TUMS - DOSED IN MG ELEMENTAL CALCIUM) 500 MG chewable tablet Chew 1 tablet by mouth as needed for indigestion or heartburn.    . carboxymethylcellulose (REFRESH PLUS) 0.5 % SOLN Place 1-2 drops into both eyes 3 (three) times daily as needed (for dry eyes).    . citalopram (CELEXA) 40 MG tablet Take 40 mg by mouth daily.     . clopidogrel (PLAVIX) 75 MG tablet Take 75 mg by mouth daily.     . colchicine 0.6 MG tablet Take by mouth.    . diclofenac sodium (VOLTAREN) 1 % GEL Apply 1 g topically 2 (two) times daily as needed (for pain.).    Marland Kitchen docusate sodium (STOOL SOFTENER) 100 MG capsule Take 200 mg by mouth daily.     . fluticasone (FLONASE) 50 MCG/ACT nasal spray Place 2 sprays into both nostrils daily.     . hydroxypropyl methylcellulose / hypromellose (ISOPTO TEARS / GONIOVISC) 2.5 % ophthalmic solution Place 1-2 drops into both eyes 4 (four) times daily as needed for dry eyes.    Marland Kitchen lidocaine-prilocaine (EMLA) cream Apply 1 application topically as needed (prior to dialysis).    Marland Kitchen loratadine (CLARITIN) 10 MG tablet Take 10 mg by mouth at bedtime.    . Misc. Devices [facility]) MISC  Frequency:PHARMDIR   Dosage:0.0     Instructions:  Note:Dx: Deconditioning with orthostatic hypotension Dose: 1    . nystatin (MYCOSTATIN/NYSTOP) powder Apply 1 g topically daily as needed (for skin rashes.).     Marland Kitchen OXYGEN Inhale 2 L into the lungs at bedtime.    . polyethylene glycol powder (GLYCOLAX/MIRALAX) powder Take 17 g by mouth daily. With coffee    . ranitidine (ZANTAC) 150 MG tablet Take 150 mg by mouth daily.   0  . RENVELA 800 MG tablet Take 800 mg by mouth 3 (three) times daily with meals.     Marland Kitchen UNIFINE PENTIPS 31G X 6 MM MISC USE WITH VICTOZA ONCE A DAY  1  . VICTOZA 18 MG/3ML SOPN Inject 0.3 mL (1.8 mg total) under the skin daily.  12   Current Facility-Administered Medications on File Prior to Visit  Medication Dose Route Frequency Provider Last Rate Last Dose  . HYDROmorphone (DILAUDID) injection 1 mg  1 mg Intravenous Q1H PRN Schnier, Dolores Lory, MD      . ondansetron [facility]) injection 4 mg  4 mg Intravenous Once Schnier, Dolores Lory, MD        There are no Patient Instructions on file for this visit. No follow-ups on file.   Kris Hartmann, NP  This note was completed with Sales executive.  Any errors are purely unintentional.

## 2019-01-12 ENCOUNTER — Ambulatory Visit (INDEPENDENT_AMBULATORY_CARE_PROVIDER_SITE_OTHER): Payer: Medicare Other | Admitting: Vascular Surgery

## 2019-01-12 ENCOUNTER — Encounter (INDEPENDENT_AMBULATORY_CARE_PROVIDER_SITE_OTHER): Payer: Medicare Other

## 2019-02-09 ENCOUNTER — Ambulatory Visit: Payer: Medicare Other | Admitting: Podiatry

## 2019-02-16 ENCOUNTER — Encounter: Payer: Self-pay | Admitting: Podiatry

## 2019-02-16 ENCOUNTER — Ambulatory Visit (INDEPENDENT_AMBULATORY_CARE_PROVIDER_SITE_OTHER): Payer: Medicare Other | Admitting: Podiatry

## 2019-02-16 ENCOUNTER — Other Ambulatory Visit: Payer: Self-pay

## 2019-02-16 DIAGNOSIS — M79676 Pain in unspecified toe(s): Secondary | ICD-10-CM

## 2019-02-16 DIAGNOSIS — M2141 Flat foot [pes planus] (acquired), right foot: Secondary | ICD-10-CM

## 2019-02-16 DIAGNOSIS — B351 Tinea unguium: Secondary | ICD-10-CM

## 2019-02-16 DIAGNOSIS — M2142 Flat foot [pes planus] (acquired), left foot: Secondary | ICD-10-CM

## 2019-02-16 DIAGNOSIS — E1142 Type 2 diabetes mellitus with diabetic polyneuropathy: Secondary | ICD-10-CM

## 2019-02-16 NOTE — Progress Notes (Signed)
Complaint:  Visit Type: Patient returns to my office for continued preventative foot care services. Complaint: Patient states" my nails have grown Jeter and thick and become painful to walk and wear shoes".  Patient has also developed a mail spicule left big toe.. Patient has been diagnosed with DM with no foot complications. The patient presents for preventative foot care services. No changes to ROS  Podiatric Exam: Vascular: dorsalis pedis and posterior tibial pulses are palpable bilateral. Capillary return is immediate. Temperature gradient is WNL. Skin turgor WNL  Sensorium: Diminished  Semmes Weinstein monofilament test. Diminished  tactile sensation bilaterally. Nail Exam: Pt has thick disfigured discolored nails with subungual debris noted bilateral entire nail hallux through fifth toenails except left hallux..  Skin lesion has developed on left nail bed with no infection noted. Ulcer Exam: There is no evidence of ulcer or pre-ulcerative changes or infection. Orthopedic Exam: Muscle tone and strength are WNL. No limitations in general ROM. No crepitus or effusions noted. Foot type and digits show no abnormalities. Bony prominences are unremarkable. Skin: No Porokeratosis. No infection or ulcers  Diagnosis:  Onychomycosis, , Pain in right toe, pain in left toes,  DPN  Hallux malleus  B/L   Hammer toes  B/L  Treatment & Plan Procedures and Treatment: Consent by patient was obtained for treatment procedures. The patient understood the discussion of treatment and procedures well. All questions were answered thoroughly reviewed. Debridement of mycotic and hypertrophic toenails, 1 through 5 bilateral and clearing of subungual debris. No ulceration, no infection noted.   Return Visit-Office Procedure: Patient instructed to return to the office for a follow up visit 3 months for continued evaluation and treatment.    Gardiner Barefoot DPM

## 2019-04-28 ENCOUNTER — Encounter: Admission: EM | Disposition: E | Payer: Self-pay | Source: Home / Self Care | Attending: Internal Medicine

## 2019-04-28 ENCOUNTER — Inpatient Hospital Stay: Payer: Medicare Other

## 2019-04-28 ENCOUNTER — Emergency Department: Payer: Medicare Other

## 2019-04-28 ENCOUNTER — Inpatient Hospital Stay
Admission: EM | Admit: 2019-04-28 | Discharge: 2019-06-03 | DRG: 207 | Disposition: E | Payer: Medicare Other | Attending: Internal Medicine | Admitting: Internal Medicine

## 2019-04-28 ENCOUNTER — Encounter: Payer: Self-pay | Admitting: Emergency Medicine

## 2019-04-28 ENCOUNTER — Other Ambulatory Visit: Payer: Self-pay

## 2019-04-28 DIAGNOSIS — Y95 Nosocomial condition: Secondary | ICD-10-CM | POA: Diagnosis present

## 2019-04-28 DIAGNOSIS — Z01818 Encounter for other preprocedural examination: Secondary | ICD-10-CM

## 2019-04-28 DIAGNOSIS — Z992 Dependence on renal dialysis: Secondary | ICD-10-CM

## 2019-04-28 DIAGNOSIS — Y832 Surgical operation with anastomosis, bypass or graft as the cause of abnormal reaction of the patient, or of later complication, without mention of misadventure at the time of the procedure: Secondary | ICD-10-CM | POA: Diagnosis present

## 2019-04-28 DIAGNOSIS — Z888 Allergy status to other drugs, medicaments and biological substances status: Secondary | ICD-10-CM

## 2019-04-28 DIAGNOSIS — N179 Acute kidney failure, unspecified: Secondary | ICD-10-CM | POA: Diagnosis present

## 2019-04-28 DIAGNOSIS — I5030 Unspecified diastolic (congestive) heart failure: Secondary | ICD-10-CM

## 2019-04-28 DIAGNOSIS — Z7902 Long term (current) use of antithrombotics/antiplatelets: Secondary | ICD-10-CM

## 2019-04-28 DIAGNOSIS — J989 Respiratory disorder, unspecified: Secondary | ICD-10-CM | POA: Diagnosis not present

## 2019-04-28 DIAGNOSIS — R0902 Hypoxemia: Secondary | ICD-10-CM

## 2019-04-28 DIAGNOSIS — D631 Anemia in chronic kidney disease: Secondary | ICD-10-CM | POA: Diagnosis present

## 2019-04-28 DIAGNOSIS — Z6841 Body Mass Index (BMI) 40.0 and over, adult: Secondary | ICD-10-CM

## 2019-04-28 DIAGNOSIS — L89152 Pressure ulcer of sacral region, stage 2: Secondary | ICD-10-CM | POA: Diagnosis not present

## 2019-04-28 DIAGNOSIS — I120 Hypertensive chronic kidney disease with stage 5 chronic kidney disease or end stage renal disease: Secondary | ICD-10-CM

## 2019-04-28 DIAGNOSIS — R627 Adult failure to thrive: Secondary | ICD-10-CM | POA: Diagnosis present

## 2019-04-28 DIAGNOSIS — E875 Hyperkalemia: Secondary | ICD-10-CM | POA: Diagnosis present

## 2019-04-28 DIAGNOSIS — J9621 Acute and chronic respiratory failure with hypoxia: Principal | ICD-10-CM

## 2019-04-28 DIAGNOSIS — J9622 Acute and chronic respiratory failure with hypercapnia: Secondary | ICD-10-CM | POA: Diagnosis present

## 2019-04-28 DIAGNOSIS — I2510 Atherosclerotic heart disease of native coronary artery without angina pectoris: Secondary | ICD-10-CM | POA: Diagnosis present

## 2019-04-28 DIAGNOSIS — Z833 Family history of diabetes mellitus: Secondary | ICD-10-CM

## 2019-04-28 DIAGNOSIS — Z515 Encounter for palliative care: Secondary | ICD-10-CM

## 2019-04-28 DIAGNOSIS — I469 Cardiac arrest, cause unspecified: Secondary | ICD-10-CM | POA: Diagnosis not present

## 2019-04-28 DIAGNOSIS — Z9911 Dependence on respirator [ventilator] status: Secondary | ICD-10-CM | POA: Diagnosis not present

## 2019-04-28 DIAGNOSIS — I959 Hypotension, unspecified: Secondary | ICD-10-CM | POA: Diagnosis not present

## 2019-04-28 DIAGNOSIS — T82898A Other specified complication of vascular prosthetic devices, implants and grafts, initial encounter: Secondary | ICD-10-CM

## 2019-04-28 DIAGNOSIS — J9602 Acute respiratory failure with hypercapnia: Secondary | ICD-10-CM | POA: Diagnosis not present

## 2019-04-28 DIAGNOSIS — Z20828 Contact with and (suspected) exposure to other viral communicable diseases: Secondary | ICD-10-CM | POA: Diagnosis present

## 2019-04-28 DIAGNOSIS — Z85528 Personal history of other malignant neoplasm of kidney: Secondary | ICD-10-CM

## 2019-04-28 DIAGNOSIS — R570 Cardiogenic shock: Secondary | ICD-10-CM | POA: Diagnosis present

## 2019-04-28 DIAGNOSIS — G931 Anoxic brain damage, not elsewhere classified: Secondary | ICD-10-CM | POA: Diagnosis present

## 2019-04-28 DIAGNOSIS — Z4659 Encounter for fitting and adjustment of other gastrointestinal appliance and device: Secondary | ICD-10-CM

## 2019-04-28 DIAGNOSIS — R571 Hypovolemic shock: Secondary | ICD-10-CM | POA: Diagnosis present

## 2019-04-28 DIAGNOSIS — J189 Pneumonia, unspecified organism: Secondary | ICD-10-CM

## 2019-04-28 DIAGNOSIS — Z8249 Family history of ischemic heart disease and other diseases of the circulatory system: Secondary | ICD-10-CM

## 2019-04-28 DIAGNOSIS — J690 Pneumonitis due to inhalation of food and vomit: Secondary | ICD-10-CM | POA: Diagnosis present

## 2019-04-28 DIAGNOSIS — N189 Chronic kidney disease, unspecified: Secondary | ICD-10-CM | POA: Diagnosis not present

## 2019-04-28 DIAGNOSIS — N186 End stage renal disease: Secondary | ICD-10-CM

## 2019-04-28 DIAGNOSIS — E1151 Type 2 diabetes mellitus with diabetic peripheral angiopathy without gangrene: Secondary | ICD-10-CM | POA: Diagnosis present

## 2019-04-28 DIAGNOSIS — L8990 Pressure ulcer of unspecified site, unspecified stage: Secondary | ICD-10-CM | POA: Insufficient documentation

## 2019-04-28 DIAGNOSIS — R0602 Shortness of breath: Secondary | ICD-10-CM

## 2019-04-28 DIAGNOSIS — J151 Pneumonia due to Pseudomonas: Secondary | ICD-10-CM | POA: Diagnosis present

## 2019-04-28 DIAGNOSIS — Z66 Do not resuscitate: Secondary | ICD-10-CM

## 2019-04-28 DIAGNOSIS — T8249XA Other complication of vascular dialysis catheter, initial encounter: Secondary | ICD-10-CM

## 2019-04-28 DIAGNOSIS — Z79899 Other long term (current) drug therapy: Secondary | ICD-10-CM

## 2019-04-28 DIAGNOSIS — I509 Heart failure, unspecified: Secondary | ICD-10-CM

## 2019-04-28 DIAGNOSIS — E1122 Type 2 diabetes mellitus with diabetic chronic kidney disease: Secondary | ICD-10-CM

## 2019-04-28 DIAGNOSIS — J9600 Acute respiratory failure, unspecified whether with hypoxia or hypercapnia: Secondary | ICD-10-CM | POA: Diagnosis not present

## 2019-04-28 DIAGNOSIS — T82818A Embolism of vascular prosthetic devices, implants and grafts, initial encounter: Secondary | ICD-10-CM | POA: Diagnosis present

## 2019-04-28 DIAGNOSIS — T829XXA Unspecified complication of cardiac and vascular prosthetic device, implant and graft, initial encounter: Secondary | ICD-10-CM | POA: Diagnosis not present

## 2019-04-28 DIAGNOSIS — G4733 Obstructive sleep apnea (adult) (pediatric): Secondary | ICD-10-CM | POA: Diagnosis present

## 2019-04-28 DIAGNOSIS — I871 Compression of vein: Secondary | ICD-10-CM | POA: Diagnosis present

## 2019-04-28 DIAGNOSIS — I132 Hypertensive heart and chronic kidney disease with heart failure and with stage 5 chronic kidney disease, or end stage renal disease: Secondary | ICD-10-CM | POA: Diagnosis present

## 2019-04-28 DIAGNOSIS — E785 Hyperlipidemia, unspecified: Secondary | ICD-10-CM | POA: Diagnosis present

## 2019-04-28 DIAGNOSIS — Z7951 Long term (current) use of inhaled steroids: Secondary | ICD-10-CM

## 2019-04-28 DIAGNOSIS — Z9981 Dependence on supplemental oxygen: Secondary | ICD-10-CM

## 2019-04-28 DIAGNOSIS — L89002 Pressure ulcer of unspecified elbow, stage 2: Secondary | ICD-10-CM | POA: Diagnosis not present

## 2019-04-28 DIAGNOSIS — I214 Non-ST elevation (NSTEMI) myocardial infarction: Secondary | ICD-10-CM | POA: Diagnosis present

## 2019-04-28 DIAGNOSIS — J9690 Respiratory failure, unspecified, unspecified whether with hypoxia or hypercapnia: Secondary | ICD-10-CM | POA: Diagnosis not present

## 2019-04-28 DIAGNOSIS — J440 Chronic obstructive pulmonary disease with acute lower respiratory infection: Secondary | ICD-10-CM | POA: Diagnosis present

## 2019-04-28 DIAGNOSIS — Z8673 Personal history of transient ischemic attack (TIA), and cerebral infarction without residual deficits: Secondary | ICD-10-CM

## 2019-04-28 DIAGNOSIS — E668 Other obesity: Secondary | ICD-10-CM | POA: Diagnosis not present

## 2019-04-28 DIAGNOSIS — E876 Hypokalemia: Secondary | ICD-10-CM | POA: Diagnosis not present

## 2019-04-28 DIAGNOSIS — Z91041 Radiographic dye allergy status: Secondary | ICD-10-CM

## 2019-04-28 DIAGNOSIS — N170 Acute kidney failure with tubular necrosis: Secondary | ICD-10-CM | POA: Diagnosis not present

## 2019-04-28 DIAGNOSIS — Z905 Acquired absence of kidney: Secondary | ICD-10-CM

## 2019-04-28 DIAGNOSIS — I5033 Acute on chronic diastolic (congestive) heart failure: Secondary | ICD-10-CM | POA: Diagnosis present

## 2019-04-28 DIAGNOSIS — Z7189 Other specified counseling: Secondary | ICD-10-CM

## 2019-04-28 DIAGNOSIS — J9601 Acute respiratory failure with hypoxia: Secondary | ICD-10-CM | POA: Diagnosis not present

## 2019-04-28 DIAGNOSIS — M109 Gout, unspecified: Secondary | ICD-10-CM | POA: Diagnosis present

## 2019-04-28 DIAGNOSIS — F419 Anxiety disorder, unspecified: Secondary | ICD-10-CM | POA: Diagnosis present

## 2019-04-28 DIAGNOSIS — R402433 Glasgow coma scale score 3-8, at hospital admission: Secondary | ICD-10-CM | POA: Diagnosis present

## 2019-04-28 DIAGNOSIS — Z7984 Long term (current) use of oral hypoglycemic drugs: Secondary | ICD-10-CM

## 2019-04-28 DIAGNOSIS — Z87891 Personal history of nicotine dependence: Secondary | ICD-10-CM

## 2019-04-28 DIAGNOSIS — N185 Chronic kidney disease, stage 5: Secondary | ICD-10-CM | POA: Diagnosis not present

## 2019-04-28 DIAGNOSIS — I5031 Acute diastolic (congestive) heart failure: Secondary | ICD-10-CM | POA: Diagnosis not present

## 2019-04-28 DIAGNOSIS — F329 Major depressive disorder, single episode, unspecified: Secondary | ICD-10-CM | POA: Diagnosis present

## 2019-04-28 DIAGNOSIS — K219 Gastro-esophageal reflux disease without esophagitis: Secondary | ICD-10-CM | POA: Diagnosis present

## 2019-04-28 DIAGNOSIS — Z87442 Personal history of urinary calculi: Secondary | ICD-10-CM

## 2019-04-28 LAB — BLOOD GAS, ARTERIAL
Acid-base deficit: 11.5 mmol/L — ABNORMAL HIGH (ref 0.0–2.0)
Bicarbonate: 17.4 mmol/L — ABNORMAL LOW (ref 20.0–28.0)
FIO2: 0.5
MECHVT: 450 mL
O2 Saturation: 88.9 %
PEEP: 10 cmH2O
Patient temperature: 37
RATE: 15 resp/min
pCO2 arterial: 50 mmHg — ABNORMAL HIGH (ref 32.0–48.0)
pH, Arterial: 7.15 — CL (ref 7.350–7.450)
pO2, Arterial: 73 mmHg — ABNORMAL LOW (ref 83.0–108.0)

## 2019-04-28 LAB — RENAL FUNCTION PANEL
Albumin: 3.2 g/dL — ABNORMAL LOW (ref 3.5–5.0)
Albumin: 3 g/dL — ABNORMAL LOW (ref 3.5–5.0)
Anion gap: 19 — ABNORMAL HIGH (ref 5–15)
Anion gap: 14 (ref 5–15)
BUN: 81 mg/dL — ABNORMAL HIGH (ref 8–23)
BUN: 71 mg/dL — ABNORMAL HIGH (ref 8–23)
CO2: 19 mmol/L — ABNORMAL LOW (ref 22–32)
CO2: 22 mmol/L (ref 22–32)
Calcium: 8.6 mg/dL — ABNORMAL LOW (ref 8.9–10.3)
Calcium: 8.5 mg/dL — ABNORMAL LOW (ref 8.9–10.3)
Chloride: 97 mmol/L — ABNORMAL LOW (ref 98–111)
Chloride: 100 mmol/L (ref 98–111)
Creatinine, Ser: 11.97 mg/dL — ABNORMAL HIGH (ref 0.44–1.00)
Creatinine, Ser: 9.81 mg/dL — ABNORMAL HIGH (ref 0.44–1.00)
GFR calc Af Amer: 3 mL/min — ABNORMAL LOW (ref >60)
GFR calc Af Amer: 4 mL/min — ABNORMAL LOW (ref >60)
GFR calc non Af Amer: 3 mL/min — ABNORMAL LOW (ref >60)
GFR calc non Af Amer: 4 mL/min — ABNORMAL LOW (ref >60)
Glucose, Bld: 257 mg/dL — ABNORMAL HIGH (ref 70–99)
Glucose, Bld: 243 mg/dL — ABNORMAL HIGH (ref 70–99)
Phosphorus: 5.2 mg/dL — ABNORMAL HIGH (ref 2.5–4.6)
Phosphorus: 4.3 mg/dL (ref 2.5–4.6)
Potassium: 4.8 mmol/L (ref 3.5–5.1)
Potassium: 4.9 mmol/L (ref 3.5–5.1)
Sodium: 135 mmol/L (ref 135–145)
Sodium: 136 mmol/L (ref 135–145)

## 2019-04-28 LAB — COMPREHENSIVE METABOLIC PANEL
ALT: 16 U/L (ref 0–44)
ALT: 41 U/L (ref 0–44)
AST: 13 U/L — ABNORMAL LOW (ref 15–41)
AST: 32 U/L (ref 15–41)
Albumin: 3.6 g/dL (ref 3.5–5.0)
Albumin: 3.3 g/dL — ABNORMAL LOW (ref 3.5–5.0)
Alkaline Phosphatase: 206 U/L — ABNORMAL HIGH (ref 38–126)
Alkaline Phosphatase: 218 U/L — ABNORMAL HIGH (ref 38–126)
Anion gap: 17 — ABNORMAL HIGH (ref 5–15)
Anion gap: 19 — ABNORMAL HIGH (ref 5–15)
BUN: 94 mg/dL — ABNORMAL HIGH (ref 8–23)
BUN: 90 mg/dL — ABNORMAL HIGH (ref 8–23)
CO2: 21 mmol/L — ABNORMAL LOW (ref 22–32)
CO2: 19 mmol/L — ABNORMAL LOW (ref 22–32)
Calcium: 8.3 mg/dL — ABNORMAL LOW (ref 8.9–10.3)
Calcium: 9 mg/dL (ref 8.9–10.3)
Chloride: 94 mmol/L — ABNORMAL LOW (ref 98–111)
Chloride: 99 mmol/L (ref 98–111)
Creatinine, Ser: 14.81 mg/dL — ABNORMAL HIGH (ref 0.44–1.00)
Creatinine, Ser: 13.69 mg/dL — ABNORMAL HIGH (ref 0.44–1.00)
GFR calc Af Amer: 3 mL/min — ABNORMAL LOW (ref >60)
GFR calc Af Amer: 3 mL/min — ABNORMAL LOW (ref >60)
GFR calc non Af Amer: 2 mL/min — ABNORMAL LOW (ref >60)
GFR calc non Af Amer: 3 mL/min — ABNORMAL LOW (ref >60)
Glucose, Bld: 208 mg/dL — ABNORMAL HIGH (ref 70–99)
Glucose, Bld: 248 mg/dL — ABNORMAL HIGH (ref 70–99)
Potassium: 7.2 mmol/L (ref 3.5–5.1)
Potassium: 4.7 mmol/L (ref 3.5–5.1)
Sodium: 132 mmol/L — ABNORMAL LOW (ref 135–145)
Sodium: 137 mmol/L (ref 135–145)
Total Bilirubin: 0.9 mg/dL (ref 0.3–1.2)
Total Bilirubin: 0.9 mg/dL (ref 0.3–1.2)
Total Protein: 7.7 g/dL (ref 6.5–8.1)
Total Protein: 7.1 g/dL (ref 6.5–8.1)

## 2019-04-28 LAB — CBC WITH DIFFERENTIAL/PLATELET
Abs Immature Granulocytes: 0.04 10*3/uL (ref 0.00–0.07)
Abs Immature Granulocytes: 0.21 10*3/uL — ABNORMAL HIGH (ref 0.00–0.07)
Basophils Absolute: 0.1 10*3/uL (ref 0.0–0.1)
Basophils Absolute: 0 10*3/uL (ref 0.0–0.1)
Basophils Relative: 1 %
Basophils Relative: 0 %
Eosinophils Absolute: 0 10*3/uL (ref 0.0–0.5)
Eosinophils Absolute: 0 10*3/uL (ref 0.0–0.5)
Eosinophils Relative: 0 %
Eosinophils Relative: 0 %
HCT: 37.2 % (ref 36.0–46.0)
HCT: 42.6 % (ref 36.0–46.0)
Hemoglobin: 12.1 g/dL (ref 12.0–15.0)
Hemoglobin: 13.9 g/dL (ref 12.0–15.0)
Immature Granulocytes: 1 %
Immature Granulocytes: 1 %
Lymphocytes Relative: 17 %
Lymphocytes Relative: 8 %
Lymphs Abs: 1.4 10*3/uL (ref 0.7–4.0)
Lymphs Abs: 1.4 10*3/uL (ref 0.7–4.0)
MCH: 29.3 pg (ref 26.0–34.0)
MCH: 29.4 pg (ref 26.0–34.0)
MCHC: 32.5 g/dL (ref 30.0–36.0)
MCHC: 32.6 g/dL (ref 30.0–36.0)
MCV: 90.1 fL (ref 80.0–100.0)
MCV: 90.3 fL (ref 80.0–100.0)
Monocytes Absolute: 0.7 10*3/uL (ref 0.1–1.0)
Monocytes Absolute: 1.3 10*3/uL — ABNORMAL HIGH (ref 0.1–1.0)
Monocytes Relative: 8 %
Monocytes Relative: 7 %
Neutro Abs: 6 10*3/uL (ref 1.7–7.7)
Neutro Abs: 14.7 10*3/uL — ABNORMAL HIGH (ref 1.7–7.7)
Neutrophils Relative %: 73 %
Neutrophils Relative %: 84 %
Platelets: 229 10*3/uL (ref 150–400)
Platelets: 272 10*3/uL (ref 150–400)
RBC: 4.13 MIL/uL (ref 3.87–5.11)
RBC: 4.72 MIL/uL (ref 3.87–5.11)
RDW: 16.8 % — ABNORMAL HIGH (ref 11.5–15.5)
RDW: 16.6 % — ABNORMAL HIGH (ref 11.5–15.5)
WBC: 8.2 10*3/uL (ref 4.0–10.5)
WBC: 17.7 10*3/uL — ABNORMAL HIGH (ref 4.0–10.5)
nRBC: 0 % (ref 0.0–0.2)
nRBC: 0.1 % (ref 0.0–0.2)

## 2019-04-28 LAB — MAGNESIUM
Magnesium: 2.2 mg/dL (ref 1.7–2.4)
Magnesium: 2.2 mg/dL (ref 1.7–2.4)
Magnesium: 2.1 mg/dL (ref 1.7–2.4)

## 2019-04-28 LAB — GLUCOSE, CAPILLARY
Glucose-Capillary: 170 mg/dL — ABNORMAL HIGH (ref 70–99)
Glucose-Capillary: 190 mg/dL — ABNORMAL HIGH (ref 70–99)
Glucose-Capillary: 225 mg/dL — ABNORMAL HIGH (ref 70–99)
Glucose-Capillary: 211 mg/dL — ABNORMAL HIGH (ref 70–99)

## 2019-04-28 LAB — SARS CORONAVIRUS 2 BY RT PCR (HOSPITAL ORDER, PERFORMED IN ~~LOC~~ HOSPITAL LAB): SARS Coronavirus 2: NEGATIVE

## 2019-04-28 LAB — TROPONIN I
Troponin I: 0.53 ng/mL (ref <0.03)
Troponin I: 0.91 ng/mL (ref <0.03)

## 2019-04-28 LAB — PROCALCITONIN: Procalcitonin: 2.19 ng/mL

## 2019-04-28 LAB — LIPASE, BLOOD: Lipase: 59 U/L — ABNORMAL HIGH (ref 11–51)

## 2019-04-28 LAB — PHOSPHORUS: Phosphorus: 6.6 mg/dL — ABNORMAL HIGH (ref 2.5–4.6)

## 2019-04-28 LAB — GLUCOSE, RANDOM: Glucose, Bld: 246 mg/dL — ABNORMAL HIGH (ref 70–99)

## 2019-04-28 LAB — PROTIME-INR
INR: 1.1 (ref 0.8–1.2)
Prothrombin Time: 14.5 seconds (ref 11.4–15.2)

## 2019-04-28 LAB — MRSA PCR SCREENING: MRSA by PCR: NEGATIVE

## 2019-04-28 SURGERY — TEMPORARY DIALYSIS CATHETER
Anesthesia: Moderate Sedation

## 2019-04-28 MED ORDER — SEVELAMER CARBONATE 800 MG PO TABS
1600.0000 mg | ORAL_TABLET | Freq: Three times a day (TID) | ORAL | Status: DC
  Administered 2019-04-29: 1600 mg via ORAL
  Filled 2019-04-28: qty 2

## 2019-04-28 MED ORDER — AMITRIPTYLINE HCL 25 MG PO TABS
25.0000 mg | ORAL_TABLET | Freq: Every day | ORAL | Status: DC
  Administered 2019-04-28 – 2019-05-05 (×8): 25 mg
  Filled 2019-04-28 (×2): qty 1
  Filled 2019-04-28 (×7): qty 0.5

## 2019-04-28 MED ORDER — ALBUTEROL SULFATE (2.5 MG/3ML) 0.083% IN NEBU
2.5000 mg | INHALATION_SOLUTION | Freq: Four times a day (QID) | RESPIRATORY_TRACT | Status: DC | PRN
  Administered 2019-05-06 – 2019-05-19 (×3): 2.5 mg via RESPIRATORY_TRACT
  Filled 2019-04-28 (×4): qty 3

## 2019-04-28 MED ORDER — SODIUM CHLORIDE 0.9 % IV SOLN
INTRAVENOUS | Status: AC | PRN
  Administered 2019-04-28: 1000 mL via INTRAVENOUS

## 2019-04-28 MED ORDER — PUREFLOW DIALYSIS SOLUTION
INTRAVENOUS | Status: DC
  Administered 2019-04-28: 15:00:00 via INTRAVENOUS_CENTRAL

## 2019-04-28 MED ORDER — EPINEPHRINE 1 MG/10ML IJ SOSY
PREFILLED_SYRINGE | INTRAMUSCULAR | Status: AC | PRN
  Administered 2019-04-28 (×5): 1 mg via INTRAVENOUS

## 2019-04-28 MED ORDER — HEPARIN SODIUM (PORCINE) 5000 UNIT/ML IJ SOLN
5000.0000 [IU] | Freq: Three times a day (TID) | INTRAMUSCULAR | Status: DC
  Administered 2019-04-28: 5000 [IU] via SUBCUTANEOUS
  Filled 2019-04-28: qty 1

## 2019-04-28 MED ORDER — FLUTICASONE PROPIONATE 50 MCG/ACT NA SUSP
2.0000 | Freq: Every day | NASAL | Status: DC
  Administered 2019-05-03 – 2019-05-07 (×3): 2 via NASAL
  Filled 2019-04-28: qty 16

## 2019-04-28 MED ORDER — VANCOMYCIN HCL IN DEXTROSE 1-5 GM/200ML-% IV SOLN
1000.0000 mg | Freq: Once | INTRAVENOUS | Status: AC
  Administered 2019-04-28: 1000 mg via INTRAVENOUS
  Filled 2019-04-28: qty 200

## 2019-04-28 MED ORDER — DOCUSATE SODIUM 100 MG PO CAPS
200.0000 mg | ORAL_CAPSULE | Freq: Two times a day (BID) | ORAL | Status: DC
  Administered 2019-04-28: 200 mg via ORAL
  Filled 2019-04-28: qty 2

## 2019-04-28 MED ORDER — ATORVASTATIN CALCIUM 20 MG PO TABS: 40.0000 mg | ORAL_TABLET | Freq: Every day | ORAL | Status: DC

## 2019-04-28 MED ORDER — CALCIUM CHLORIDE 10 % IV SOLN
INTRAVENOUS | Status: AC
  Filled 2019-04-28: qty 20

## 2019-04-28 MED ORDER — CALCIUM CARBONATE ANTACID 500 MG PO CHEW
1.0000 | CHEWABLE_TABLET | ORAL | Status: DC | PRN
  Filled 2019-04-28: qty 1

## 2019-04-28 MED ORDER — POLYETHYLENE GLYCOL 3350 17 GM/SCOOP PO POWD
17.0000 g | Freq: Three times a day (TID) | ORAL | Status: DC | PRN
  Filled 2019-04-28: qty 255

## 2019-04-28 MED ORDER — ORAL CARE MOUTH RINSE
15.0000 mL | OROMUCOSAL | Status: DC
  Administered 2019-04-28 – 2019-05-05 (×70): 15 mL via OROMUCOSAL

## 2019-04-28 MED ORDER — CITALOPRAM HYDROBROMIDE 20 MG PO TABS
40.0000 mg | ORAL_TABLET | Freq: Every day | ORAL | Status: DC
  Administered 2019-04-28: 40 mg via ORAL
  Filled 2019-04-28: qty 2

## 2019-04-28 MED ORDER — SODIUM CHLORIDE 0.9% FLUSH
10.0000 mL | Freq: Two times a day (BID) | INTRAVENOUS | Status: DC
  Administered 2019-04-28 – 2019-05-06 (×14): 10 mL
  Administered 2019-05-06: 09:00:00 20 mL
  Administered 2019-05-07 – 2019-05-12 (×9): 10 mL
  Administered 2019-05-12: 20 mL
  Administered 2019-05-13 – 2019-05-15 (×5): 10 mL
  Administered 2019-05-16: 40 mL
  Administered 2019-05-16 – 2019-05-17 (×2): 10 mL
  Administered 2019-05-17 – 2019-05-18 (×2): 40 mL
  Administered 2019-05-18: 11:00:00 10 mL
  Administered 2019-05-20: 40 mL

## 2019-04-28 MED ORDER — SODIUM BICARBONATE 8.4 % IV SOLN
INTRAVENOUS | Status: AC | PRN
  Administered 2019-04-28: 50 meq via INTRAVENOUS

## 2019-04-28 MED ORDER — NYSTATIN 100000 UNIT/GM EX POWD
1.0000 g | Freq: Every day | CUTANEOUS | Status: DC | PRN
  Filled 2019-04-28: qty 15

## 2019-04-28 MED ORDER — SODIUM CHLORIDE 0.9 % IV SOLN
2.0000 g | Freq: Two times a day (BID) | INTRAVENOUS | Status: DC
  Administered 2019-04-28 – 2019-04-29 (×3): 2 g via INTRAVENOUS
  Filled 2019-04-28 (×4): qty 2

## 2019-04-28 MED ORDER — LIRAGLUTIDE 18 MG/3ML ~~LOC~~ SOPN: 1.2000 mg | PEN_INJECTOR | Freq: Every day | SUBCUTANEOUS | Status: DC

## 2019-04-28 MED ORDER — CHLORHEXIDINE GLUCONATE 0.12% ORAL RINSE (MEDLINE KIT)
15.0000 mL | Freq: Two times a day (BID) | OROMUCOSAL | Status: DC
  Administered 2019-04-28 – 2019-05-05 (×15): 15 mL via OROMUCOSAL

## 2019-04-28 MED ORDER — CLOPIDOGREL BISULFATE 75 MG PO TABS
75.0000 mg | ORAL_TABLET | Freq: Every day | ORAL | Status: DC
  Administered 2019-04-28: 75 mg via ORAL
  Filled 2019-04-28: qty 1

## 2019-04-28 MED ORDER — SODIUM BICARBONATE 8.4 % IV SOLN
INTRAVENOUS | Status: AC
  Filled 2019-04-28: qty 100

## 2019-04-28 MED ORDER — HEPARIN (PORCINE) 25000 UT/250ML-% IV SOLN
1600.0000 [IU]/h | INTRAVENOUS | Status: DC
  Administered 2019-04-28: 1200 [IU]/h via INTRAVENOUS
  Administered 2019-04-29: 1450 [IU]/h via INTRAVENOUS
  Administered 2019-04-30: 1700 [IU]/h via INTRAVENOUS
  Filled 2019-04-28 (×3): qty 250

## 2019-04-28 MED ORDER — EPINEPHRINE 1 MG/10ML IJ SOSY
PREFILLED_SYRINGE | INTRAMUSCULAR | Status: AC | PRN
  Administered 2019-04-28: 1 mg via INTRAVENOUS

## 2019-04-28 MED ORDER — AMLODIPINE BESYLATE 5 MG PO TABS: 10.0000 mg | ORAL_TABLET | Freq: Every day | ORAL | Status: DC

## 2019-04-28 MED ORDER — CITALOPRAM HYDROBROMIDE 20 MG PO TABS
40.0000 mg | ORAL_TABLET | Freq: Every day | ORAL | Status: DC
  Administered 2019-04-29 – 2019-05-06 (×8): 40 mg
  Filled 2019-04-28 (×8): qty 2

## 2019-04-28 MED ORDER — ONDANSETRON HCL 4 MG/2ML IJ SOLN
INTRAMUSCULAR | Status: AC
  Filled 2019-04-28: qty 2

## 2019-04-28 MED ORDER — ALLOPURINOL 100 MG PO TABS
150.0000 mg | ORAL_TABLET | ORAL | Status: DC
  Administered 2019-04-30 – 2019-05-05 (×3): 150 mg
  Filled 2019-04-28 (×3): qty 1.5

## 2019-04-28 MED ORDER — DEXMEDETOMIDINE HCL IN NACL 400 MCG/100ML IV SOLN
0.4000 ug/kg/h | INTRAVENOUS | Status: DC
  Administered 2019-04-28: 1.2 ug/kg/h via INTRAVENOUS
  Administered 2019-04-28 (×2): 1 ug/kg/h via INTRAVENOUS
  Administered 2019-04-28 – 2019-04-29 (×3): 1.2 ug/kg/h via INTRAVENOUS
  Administered 2019-04-29 (×2): 1 ug/kg/h via INTRAVENOUS
  Administered 2019-04-29: 0.6 ug/kg/h via INTRAVENOUS
  Administered 2019-04-29: 1.2 ug/kg/h via INTRAVENOUS
  Administered 2019-04-29: 1 ug/kg/h via INTRAVENOUS
  Administered 2019-04-29: 1.2 ug/kg/h via INTRAVENOUS
  Administered 2019-04-29: 0.4 ug/kg/h via INTRAVENOUS
  Administered 2019-04-30 (×7): 1 ug/kg/h via INTRAVENOUS
  Administered 2019-05-01 (×2): 1.2 ug/kg/h via INTRAVENOUS
  Administered 2019-05-01: 1 ug/kg/h via INTRAVENOUS
  Administered 2019-05-01 (×2): 1.2 ug/kg/h via INTRAVENOUS
  Administered 2019-05-01: 0.9 ug/kg/h via INTRAVENOUS
  Administered 2019-05-01 – 2019-05-02 (×3): 0.7 ug/kg/h via INTRAVENOUS
  Administered 2019-05-02 (×2): 1.2 ug/kg/h via INTRAVENOUS
  Administered 2019-05-02: 0.7 ug/kg/h via INTRAVENOUS
  Administered 2019-05-02 (×2): 1.2 ug/kg/h via INTRAVENOUS
  Administered 2019-05-02: 1 ug/kg/h via INTRAVENOUS
  Administered 2019-05-03 (×4): 0.8 ug/kg/h via INTRAVENOUS
  Administered 2019-05-03: 1 ug/kg/h via INTRAVENOUS
  Administered 2019-05-03: 0.8 ug/kg/h via INTRAVENOUS
  Administered 2019-05-04: 1 ug/kg/h via INTRAVENOUS
  Administered 2019-05-04: 0.8 ug/kg/h via INTRAVENOUS
  Administered 2019-05-04: 1 ug/kg/h via INTRAVENOUS
  Administered 2019-05-04 (×2): 0.8 ug/kg/h via INTRAVENOUS
  Administered 2019-05-04: 1 ug/kg/h via INTRAVENOUS
  Administered 2019-05-04: 0.8 ug/kg/h via INTRAVENOUS
  Administered 2019-05-04 – 2019-05-05 (×4): 1 ug/kg/h via INTRAVENOUS
  Filled 2019-04-28 (×54): qty 100

## 2019-04-28 MED ORDER — INSULIN ASPART 100 UNIT/ML ~~LOC~~ SOLN
SUBCUTANEOUS | Status: AC
  Filled 2019-04-28: qty 1

## 2019-04-28 MED ORDER — CARBOXYMETHYLCELLULOSE SODIUM 0.5 % OP SOLN: 1.0000 [drp] | Freq: Three times a day (TID) | OPHTHALMIC | Status: DC | PRN

## 2019-04-28 MED ORDER — POLYVINYL ALCOHOL 1.4 % OP SOLN
1.0000 [drp] | Freq: Four times a day (QID) | OPHTHALMIC | Status: DC | PRN
  Filled 2019-04-28: qty 15

## 2019-04-28 MED ORDER — EPINEPHRINE 1 MG/10ML IJ SOSY
PREFILLED_SYRINGE | INTRAMUSCULAR | Status: AC
  Filled 2019-04-28: qty 40

## 2019-04-28 MED ORDER — SODIUM BICARBONATE 8.4 % IV SOLN
50.0000 meq | Freq: Once | INTRAVENOUS | Status: AC
  Administered 2019-04-28: 50 meq via INTRAVENOUS
  Filled 2019-04-28: qty 50

## 2019-04-28 MED ORDER — INSULIN ASPART 100 UNIT/ML IV SOLN
10.0000 [IU] | Freq: Once | INTRAVENOUS | Status: AC
  Administered 2019-04-28: 10 [IU] via INTRAVENOUS
  Filled 2019-04-28: qty 0.1

## 2019-04-28 MED ORDER — DEXTROSE 50 % IV SOLN
2.0000 | Freq: Once | INTRAVENOUS | Status: AC
  Administered 2019-04-28: 100 mL via INTRAVENOUS
  Filled 2019-04-28: qty 100

## 2019-04-28 MED ORDER — SODIUM CHLORIDE 0.9% FLUSH: 10.0000 mL | INTRAVENOUS | Status: DC | PRN

## 2019-04-28 MED ORDER — NOREPINEPHRINE 16 MG/250ML-% IV SOLN
0.0000 ug/min | INTRAVENOUS | Status: DC
  Filled 2019-04-28: qty 250

## 2019-04-28 MED ORDER — INSULIN ASPART 100 UNIT/ML ~~LOC~~ SOLN
0.0000 [IU] | SUBCUTANEOUS | Status: DC
  Administered 2019-04-28 (×2): 3 [IU] via SUBCUTANEOUS
  Administered 2019-04-29: 2 [IU] via SUBCUTANEOUS
  Administered 2019-04-29: 1 [IU] via SUBCUTANEOUS
  Administered 2019-04-29: 2 [IU] via SUBCUTANEOUS
  Administered 2019-04-29: 1 [IU] via SUBCUTANEOUS
  Administered 2019-04-30 – 2019-05-01 (×7): 2 [IU] via SUBCUTANEOUS
  Administered 2019-05-01: 3 [IU] via SUBCUTANEOUS
  Administered 2019-05-01: 2 [IU] via SUBCUTANEOUS
  Administered 2019-05-01: 3 [IU] via SUBCUTANEOUS
  Administered 2019-05-01: 1 [IU] via SUBCUTANEOUS
  Administered 2019-05-02 (×2): 2 [IU] via SUBCUTANEOUS
  Administered 2019-05-02: 1 [IU] via SUBCUTANEOUS
  Administered 2019-05-02 – 2019-05-03 (×3): 2 [IU] via SUBCUTANEOUS
  Administered 2019-05-03: 3 [IU] via SUBCUTANEOUS
  Administered 2019-05-03 (×2): 2 [IU] via SUBCUTANEOUS
  Administered 2019-05-03: 3 [IU] via SUBCUTANEOUS
  Administered 2019-05-03: 2 [IU] via SUBCUTANEOUS
  Administered 2019-05-04 (×4): 3 [IU] via SUBCUTANEOUS
  Administered 2019-05-04: 2 [IU] via SUBCUTANEOUS
  Administered 2019-05-04 – 2019-05-05 (×2): 1 [IU] via SUBCUTANEOUS
  Administered 2019-05-05: 2 [IU] via SUBCUTANEOUS
  Administered 2019-05-05 – 2019-05-10 (×13): 1 [IU] via SUBCUTANEOUS
  Administered 2019-05-10: 3 [IU] via SUBCUTANEOUS
  Administered 2019-05-10: 2 [IU] via SUBCUTANEOUS
  Administered 2019-05-10: 3 [IU] via SUBCUTANEOUS
  Administered 2019-05-10: 08:00:00 2 [IU] via SUBCUTANEOUS
  Administered 2019-05-10: 3 [IU] via SUBCUTANEOUS
  Administered 2019-05-11 – 2019-05-12 (×6): 2 [IU] via SUBCUTANEOUS
  Administered 2019-05-12: 3 [IU] via SUBCUTANEOUS
  Administered 2019-05-12: 05:00:00 1 [IU] via SUBCUTANEOUS
  Administered 2019-05-12 (×4): 2 [IU] via SUBCUTANEOUS
  Administered 2019-05-13 (×2): 3 [IU] via SUBCUTANEOUS
  Administered 2019-05-13: 04:00:00 2 [IU] via SUBCUTANEOUS
  Administered 2019-05-13: 17:00:00 3 [IU] via SUBCUTANEOUS
  Administered 2019-05-13: 2 [IU] via SUBCUTANEOUS
  Administered 2019-05-14 (×2): 5 [IU] via SUBCUTANEOUS
  Administered 2019-05-14: 3 [IU] via SUBCUTANEOUS
  Administered 2019-05-14: 09:00:00 5 [IU] via SUBCUTANEOUS
  Filled 2019-04-28 (×75): qty 1

## 2019-04-28 MED ORDER — ALBUTEROL SULFATE HFA 108 (90 BASE) MCG/ACT IN AERS: 2.0000 | INHALATION_SPRAY | Freq: Four times a day (QID) | RESPIRATORY_TRACT | Status: DC | PRN

## 2019-04-28 MED ORDER — ALLOPURINOL 100 MG PO TABS
150.0000 mg | ORAL_TABLET | ORAL | Status: DC
  Filled 2019-04-28: qty 1.5

## 2019-04-28 MED ORDER — VANCOMYCIN HCL IN DEXTROSE 1-5 GM/200ML-% IV SOLN
1000.0000 mg | INTRAVENOUS | Status: DC
  Filled 2019-04-28: qty 200

## 2019-04-28 MED ORDER — SODIUM CHLORIDE 0.9 % IV SOLN: 1.0000 g | INTRAVENOUS | Status: DC

## 2019-04-28 MED ORDER — NOREPINEPHRINE BITARTRATE 1 MG/ML IV SOLN
0.0000 ug/min | INTRAVENOUS | Status: DC
  Administered 2019-04-28: 40 ug/min via INTRAVENOUS
  Administered 2019-04-29: 10 ug/min via INTRAVENOUS
  Filled 2019-04-28 (×3): qty 16

## 2019-04-28 MED ORDER — ONDANSETRON HCL 4 MG/2ML IJ SOLN
4.0000 mg | Freq: Once | INTRAMUSCULAR | Status: AC
  Administered 2019-04-28: 4 mg via INTRAVENOUS

## 2019-04-28 MED ORDER — AMITRIPTYLINE HCL 50 MG PO TABS
25.0000 mg | ORAL_TABLET | Freq: Every day | ORAL | Status: DC
  Filled 2019-04-28: qty 0.5

## 2019-04-28 MED ORDER — SODIUM CHLORIDE 0.9 % IV SOLN
0.0000 ug/min | INTRAVENOUS | Status: DC
  Administered 2019-04-28: 250 ug/min via INTRAVENOUS
  Filled 2019-04-28: qty 40
  Filled 2019-04-28: qty 4

## 2019-04-28 MED ORDER — ATORVASTATIN CALCIUM 20 MG PO TABS
40.0000 mg | ORAL_TABLET | Freq: Every day | ORAL | Status: DC
  Administered 2019-04-29 – 2019-05-05 (×7): 40 mg
  Filled 2019-04-28 (×7): qty 2

## 2019-04-28 MED ORDER — CALCIUM GLUCONATE 10 % IV SOLN
1.0000 g | Freq: Once | INTRAVENOUS | Status: AC
  Administered 2019-04-28: 1 g via INTRAVENOUS
  Filled 2019-04-28: qty 10

## 2019-04-28 MED ORDER — CALCIUM CHLORIDE 10 % IV SOLN
INTRAVENOUS | Status: AC | PRN
  Administered 2019-04-28: 1 g via INTRAVENOUS

## 2019-04-28 MED ORDER — SODIUM BICARBONATE 8.4 % IV SOLN
INTRAVENOUS | Status: AC | PRN
  Administered 2019-04-28 (×2): 50 meq via INTRAVENOUS

## 2019-04-28 MED ORDER — VANCOMYCIN HCL IN DEXTROSE 1-5 GM/200ML-% IV SOLN
1000.0000 mg | Freq: Once | INTRAVENOUS | Status: DC
  Filled 2019-04-28: qty 200

## 2019-04-28 MED ORDER — DOPAMINE-DEXTROSE 3.2-5 MG/ML-% IV SOLN
INTRAVENOUS | Status: AC | PRN
  Administered 2019-04-28: 15 ug/kg/min via INTRAVENOUS

## 2019-04-28 MED ORDER — CALCIUM CHLORIDE 10 % IV SOLN
INTRAVENOUS | Status: AC | PRN
  Administered 2019-04-28 (×3): 1 g via INTRAVENOUS

## 2019-04-28 MED ORDER — SODIUM CHLORIDE 0.9 % IV SOLN
2.0000 g | Freq: Once | INTRAVENOUS | Status: AC
  Administered 2019-04-28: 2 g via INTRAVENOUS
  Filled 2019-04-28: qty 2

## 2019-04-28 MED ORDER — LIDOCAINE-PRILOCAINE 2.5-2.5 % EX CREA
1.0000 "application " | TOPICAL_CREAM | CUTANEOUS | Status: DC | PRN
  Filled 2019-04-28: qty 5

## 2019-04-28 MED ORDER — HEPARIN SODIUM (PORCINE) 1000 UNIT/ML DIALYSIS
1000.0000 [IU] | INTRAMUSCULAR | Status: DC | PRN
  Administered 2019-05-02: 3000 [IU] via INTRAVENOUS_CENTRAL
  Administered 2019-05-02: 1000 [IU] via INTRAVENOUS_CENTRAL
  Filled 2019-04-28 (×4): qty 6

## 2019-04-28 MED ORDER — VASOPRESSIN 20 UNIT/ML IV SOLN
0.0300 [IU]/min | INTRAVENOUS | Status: DC
  Administered 2019-04-28: 0.03 [IU]/min via INTRAVENOUS
  Filled 2019-04-28: qty 2

## 2019-04-28 MED ORDER — LORATADINE 10 MG PO TABS
10.0000 mg | ORAL_TABLET | Freq: Every day | ORAL | Status: DC
  Administered 2019-04-28: 10 mg via ORAL
  Filled 2019-04-28: qty 1

## 2019-04-28 MED ORDER — CLOPIDOGREL BISULFATE 75 MG PO TABS
75.0000 mg | ORAL_TABLET | Freq: Every day | ORAL | Status: DC
  Administered 2019-04-29 – 2019-05-06 (×8): 75 mg
  Filled 2019-04-28 (×8): qty 1

## 2019-04-28 MED ORDER — DOPAMINE-DEXTROSE 3.2-5 MG/ML-% IV SOLN
INTRAVENOUS | Status: AC | PRN
  Administered 2019-04-28: 10 ug/kg/min via INTRAVENOUS

## 2019-04-28 NOTE — Code Documentation (Signed)
Dr Joni Fears preparing to intubate. CPR continues.

## 2019-04-28 NOTE — ED Triage Notes (Addendum)
C/O SOB.  Patient has ESRD and goes to HD T, TH, S.  On Saturday, unable to be dialyzed due to clotted AV fistula.  Patient has appointment scheduled for this morning to unclot access at 1100.  Per EMS report, patient wears home oxygen at 3L/ [place].  Initial sats on 3L were 70-80's.  Oxygen increased to 4L/ , EMS reports sats 86-91%  Patient is AAOx3.  Skin warm and dry.  Tachypnea noted.  Patient also reports vomiting and diarrhea since last night.

## 2019-04-28 NOTE — Progress Notes (Signed)
ANTICOAGULATION CONSULT NOTE - Initial Consult  Pharmacy Consult for Heparin Indication: chest pain/ACS  Allergies  Allergen Reactions  . Iodine Rash  . Enalapril Other (See Comments)    TONGUE SWELLS/GOUT  . Iodinated Diagnostic Agents Other (See Comments)    BREAK OUT IN WHELPS      Patient Measurements: Height: 5\' 6"  (167.6 cm) Weight: 295 lb 13.7 oz (134.2 kg) IBW/kg (Calculated) : 59.3 Heparin Dosing Weight: 92.1 kg  Vital Signs: Temp: 97.7 F (36.5 C) (05/26 1150) Temp Source: Oral (05/26 1150) BP: 66/51 (05/26 1200) Pulse Rate: 80 (05/26 1200)  Labs: Recent Labs    04/20/2019 0731 04/09/2019 1451  HGB 12.1 13.9  HCT 37.2 42.6  PLT 229 272  LABPROT 14.5  --   INR 1.1  --   CREATININE 14.81* 13.69*  TROPONINI  --  0.53*    Estimated Creatinine Clearance: 5.9 mL/min (A) (by C-G formula based on SCr of 13.69 mg/dL (H)).   Medical History: Past Medical History:  Diagnosis Date  . Anemia   . Anxiety   . Arthritis   . Cancer [facility])    Left Kidney Cancer  . CHF (congestive heart failure) ([place])   . Chronic kidney disease   . COPD (chronic obstructive pulmonary disease) ([facility])    Use Oxygen at bedtime  . Coronary artery disease   . Diabetes mellitus without complication ([place])   . Dialysis patient ([place])    Tues, Thurs, Sat  . Dyspnea    with exertion  . GERD (gastroesophageal reflux disease)   . Gout   . History of kidney stones   . Hypertension   . Neuropathy   . Peripheral vascular disease ([place])   . Sleep apnea    OSA--USE C-PAP  . Stroke [facility]) 2004    Assessment: Patient is a 63yo female admitted post cardiac arrest. Patient is ESRD and missed several sessions of HD, hyperkalemia on admission with K of 7.2. Now with elevated troponin. Pharmacy consulted for Heparin drip dosing.  Goal of Therapy:  Heparin level 0.3-0.7 units/ml Monitor platelets by anticoagulation protocol: Yes   Plan:  Patient just received Heparin 5000 units SQ for DVT  prophylaxis, will discontinue this order. Will order Heparin 1200 units/hr IV to begin, no bolus since patient received SQ Heparin. Will check a HL in 8 hours. Daily CBC while on Heparin drip.  Paulina Fusi, PharmD, BCPS 04/12/2019 3:44 PM

## 2019-04-28 NOTE — Code Documentation (Signed)
Continuing CPR, pads on patient

## 2019-04-28 NOTE — Progress Notes (Addendum)
Called to ER room  19 @ 10:35. Patient bagged with 100% oxygen during CPR and transferred to CCU while continuing to use ambu bag for ventilation.  Report handed off to CCU therapist as patient being placed on vent in the CCU bed #1.

## 2019-04-28 NOTE — ED Notes (Signed)
Patient called me to room.  Says she is nauseated and thinks her sugar has gone down.  fsbs is 170.  Patient is nauseated and gaggine and feels cool and clammy.

## 2019-04-28 NOTE — ED Notes (Signed)
AT patient's bedside to recheck CBG.  VAncomycin infusing.  Patient suddenly began c/o chest tightness and RR increased to 42.  Dr. Joni Fears called to bedside.  Patient respirations aganol and pulse decreasing to 50-60's.  Patient stopped breathing and lost pulse, CPR initiated.  See code record.

## 2019-04-28 NOTE — ED Notes (Signed)
With patient's consent, Patient's spouse, [name] Peabody called at [phone].  Updated on plan of care.  Understanding verbalized.  Mr. Waszak wishes to be kept informed throughout patient hospital stay.

## 2019-04-28 NOTE — Code Documentation (Signed)
CPR in progress. 

## 2019-04-28 NOTE — Progress Notes (Signed)
Pharmacy Antibiotic Note  [name] is a 63 y.o. female admitted on 04/05/2019 with pneumonia.  Pharmacy has been consulted for Vancomycin and Cefepime dosing. Patient is ESRD on HD on Tue/Thur/Sat schedule. Patient is now mechanically ventilated and has been initiated on CRRT.  Plan: Vancomycin 2g IV loading dose followed by 1g IV q24h.  Cefepime 2g IV q12h.  If changes to HD will need to adjust dosing.  Height: 5\' 6"  (167.6 cm) Weight: 276 lb (125.2 kg) IBW/kg (Calculated) : 59.3  Temp (24hrs), Avg:98.2 F (36.8 C), Min:98.2 F (36.8 C), Max:98.2 F (36.8 C)  Recent Labs  Lab 04/23/2019 0731  WBC 8.2  CREATININE 14.81*    Estimated Creatinine Clearance: 5.2 mL/min (A) (by C-G formula based on SCr of 14.81 mg/dL (H)).    Allergies  Allergen Reactions  . Iodine Rash  . Enalapril Other (See Comments)    TONGUE SWELLS/GOUT  . Iodinated Diagnostic Agents Other (See Comments)    BREAK OUT IN WHELPS      Antimicrobials this admission: Vancomycin  5/26 >>  Cefepime 5/26 >>   Thank you for allowing pharmacy to be a part of this patient's care.  Paulina Fusi, PharmD, BCPS 04/22/2019 1:50 PM

## 2019-04-28 NOTE — Code Documentation (Signed)
To ICU with primary RN, RT, tech.

## 2019-04-28 NOTE — Consult Note (Addendum)
[facility] Consult Note  MRN : [id number]  [name] is a 63 y.o. (1956/10/28) female who presents with chief complaint of  Chief Complaint  Patient presents with  . Shortness of Breath   History of Present Illness:  The patient is a 63 year old female with a past medical history of diabetes, hypertension, SVC syndrome, end-stage renal disease on chronic hemodialysis who had dialysis approximately 5 days ago.  Most recently found to have a "clotted" dialysis access 3 days ago at her dialysis center.  She presents to the [facility] emergency department with worsening shortness of breath x2 days.  She denies any chest pain, fever, nausea vomiting.    Patient was admitted and was found to have potassium 7.2.  Due to her potassium level being so high a declot / fistulogram / PermCath insertion is contraindicated. The patient was intubated for progressively worsening SOB. ICU staff placed temporary dialysis catheter in order to allow for immediate dialysis.   Vascular Surgery was consulted by Dr. Manuella Ghazi for further recommendations Current Facility-Administered Medications  Medication Dose Route Frequency Provider Last Rate Last Dose  . albuterol (PROVENTIL) (2.5 MG/3ML) 0.083% nebulizer solution 2.5 mg  2.5 mg Nebulization Q6H PRN Max Sane, MD      . allopurinol (ZYLOPRIM) tablet 150 mg  150 mg Oral Once per day on Tue Thu Sat Lang Snow, NP      . amitriptyline (ELAVIL) tablet 25 mg  25 mg Oral QHS Lang Snow, NP      . amLODipine (NORVASC) tablet 10 mg  10 mg Oral Daily Lang Snow, NP      . Derrill Memo ON 04/29/2019] atorvastatin (LIPITOR) tablet 40 mg  40 mg Oral Daily Ouma, Bing Neighbors, NP      . calcium carbonate (TUMS - dosed in mg elemental calcium) chewable tablet 200 mg of elemental calcium  1 tablet Oral PRN Lang Snow, NP      . calcium chloride 10 % injection            . [START ON 04/29/2019] ceFEPIme (MAXIPIME) 1 g in sodium chloride 0.9 % 100 mL IVPB  1 g Intravenous Q24H Vira Blanco, RPH      . citalopram (CELEXA) tablet 40 mg  40 mg Oral Daily Ouma, Bing Neighbors, NP      . clopidogrel (PLAVIX) tablet 75 mg  75 mg Oral Daily Ouma, Bing Neighbors, NP      . dexmedetomidine (PRECEDEX) 400 MCG/100ML (4 mcg/mL) infusion  0.4-1.2 mcg/kg/hr Intravenous Titrated Ottie Glazier, MD      . docusate sodium (COLACE) capsule 200 mg  200 mg Oral BID Lang Snow, NP      . fluticasone (FLONASE) 50 MCG/ACT nasal spray 2 spray  2 spray Each Nare Daily Ouma, Bing Neighbors, NP      . heparin injection 1,000-6,000 Units  1,000-6,000 Units CRRT PRN Kolluru, Sarath, MD      . heparin injection 5,000 Units  5,000 Units Subcutaneous Q8H Ouma, Bing Neighbors, NP      . insulin aspart (novoLOG) 100 UNIT/ML injection           . lidocaine-prilocaine (EMLA) cream 1 application  1 application Topical PRN Lang Snow, NP      . liraglutide (VICTOZA) SOPN 1.2 mg  1.2 mg Subcutaneous Daily Ouma, Bing Neighbors, NP      . loratadine (CLARITIN) tablet 10 mg  10 mg Oral QHS  Lang Snow, NP      . norepinephrine (LEVOPHED) 16 mg in dextrose 5 % 250 mL (0.064 mg/mL) infusion  0-40 mcg/min Intravenous Titrated Aleskerov, Fuad, MD      . nystatin (MYCOSTATIN/NYSTOP) topical powder 1 g  1 g Topical Daily PRN Lang Snow, NP      . ondansetron [facility]) 4 MG/2ML injection           . phenylephrine (NEO-SYNEPHRINE) 40 mg in sodium chloride 0.9 % 250 mL (0.16 mg/mL) infusion  0-400 mcg/min Intravenous Titrated Aleskerov, Fuad, MD      . polyethylene glycol powder (GLYCOLAX/MIRALAX) container 17 g  17 g Oral TID PRN Lang Snow, NP      . polyvinyl alcohol (LIQUIFILM TEARS) 1.4 % ophthalmic solution 1-2 drop  1-2 drop Both Eyes QID PRN Lang Snow, NP      . pureflow IV solution for Dialysis   CRRT Continuous  Kolluru, Sarath, MD      . sevelamer carbonate (RENVELA) tablet 1,600 mg  1,600 mg Oral TID WC Ouma, Bing Neighbors, NP      . sodium bicarbonate 1 mEq/mL injection           . vancomycin (VANCOCIN) IVPB 1000 mg/200 mL premix  1,000 mg Intravenous Once Vira Blanco, RPH      . vasopressin (PITRESSIN) 40 Units in sodium chloride 0.9 % 250 mL (0.16 Units/mL) infusion  0.03 Units/min Intravenous Continuous Ottie Glazier, MD       Past Medical History:  Diagnosis Date  . Anemia   . Anxiety   . Arthritis   . Cancer [facility])    Left Kidney Cancer  . CHF (congestive heart failure) ([place])   . Chronic kidney disease   . COPD (chronic obstructive pulmonary disease) (H[facility])    Use Oxygen at bedtime  . Coronary artery disease   . Diabetes mellitus without complication ([place])   . Dialysis patient ([place])    Tues, Thurs, Sat  . Dyspnea    with exertion  . GERD (gastroesophageal reflux disease)   . Gout   . History of kidney stones   . Hypertension   . Neuropathy   . Peripheral vascular disease ([place])   . Sleep apnea    OSA--USE C-PAP  . Stroke [facility]) 2004   Past Surgical History:  Procedure Laterality Date  . A/V FISTULAGRAM Left 04/26/2017   Procedure: A/V Fistulagram;  Surgeon: Katha Cabal, MD;  Location: [place] [facility];  Service: Cardiovascular;  Laterality: Left;  . ABDOMINAL HYSTERECTOMY    . CARDIAC CATHETERIZATION    . DIALYSIS/PERMA CATHETER INSERTION N/A 01/31/2017   Procedure: Dialysis/Perma Catheter Insertion;  Surgeon: Algernon Huxley, MD;  Location: [place] [facility];  Service: Cardiovascular;  Laterality: N/A;  . DIALYSIS/PERMA CATHETER INSERTION N/A 02/21/2017   Procedure: Dialysis/Perma Catheter Insertion;  Surgeon: Algernon Huxley, MD;  Location: [place] [facility] LAB;  Service: Cardiovascular;  Laterality: N/A;  . DIALYSIS/PERMA CATHETER INSERTION N/A 03/19/2017   Procedure: Dialysis/Perma Catheter Insertion;  Surgeon: Katha Cabal, MD;  Location: [place] CV  LAB;  Service: Cardiovascular;  Laterality: N/A;  . DIALYSIS/PERMA CATHETER REMOVAL N/A 07/19/2017   Procedure: DIALYSIS/PERMA CATHETER REMOVAL;  Surgeon: Katha Cabal, MD;  Location: [place] CV LAB;  Service: Cardiovascular;  Laterality: N/A;  . EYE SURGERY Bilateral    Cataract Extraction with IOL  . KIDNEY SURGERY Left    Partial Nephrectomy  . LEFT HEART CATH AND  CORONARY ANGIOGRAPHY N/A 04/05/2017   Procedure: Left Heart Cath and Coronary Angiography;  Surgeon: Wellington Hampshire, MD;  Location: [place] [facility];  Service: Cardiovascular;  Laterality: N/A;  . PERIPHERAL VASCULAR CATHETERIZATION N/A 01/23/2016   Procedure: Dialysis/Perma Catheter Insertion;  Surgeon: Algernon Huxley, MD;  Location: [place] CV LAB;  Service: Cardiovascular;  Laterality: N/A;  . PERIPHERAL VASCULAR CATHETERIZATION Left 10/01/2016   Procedure: A/V Shuntogram/Fistulagram;  Surgeon: Algernon Huxley, MD;  Location: [place] CV LAB;  Service: Cardiovascular;  Laterality: Left;  . PERIPHERAL VASCULAR THROMBECTOMY Left 11/24/2018   Procedure: PERIPHERAL VASCULAR THROMBECTOMY;  Surgeon: Algernon Huxley, MD;  Location: [place] [facility] LAB;  Service: Cardiovascular;  Laterality: Left;  . UPPER EXTREMITY VENOGRAPHY Bilateral 04/26/2017   Procedure: Upper Extremity Venography;  Surgeon: Katha Cabal, MD;  Location: [place] CV LAB;  Service: Cardiovascular;  Laterality: Bilateral;  . VASCULAR ACCESS DEVICE INSERTION Left 05/22/2017   Procedure: INSERTION OF HERO VASCULAR ACCESS DEVICE;  Surgeon: Katha Cabal, MD;  Location: [facility] ORS;  Service: Vascular;  Laterality: Left;   Social History Social History   Tobacco Use  . Smoking status: Former Research scientist (life sciences)  . Smokeless tobacco: Never Used  Substance Use Topics  . Alcohol use: No  . Drug use: No   Family History Family History  Problem Relation Age of Onset  . Hypertension Mother   . Heart failure Mother   . Diabetes Father   . Heart  attack Father   . Lung cancer Maternal Aunt   . Cancer Maternal Aunt   Denies family history of peripheral artery disease, venous disease and/or bleeding/clotting disorders.  Allergies  Allergen Reactions  . Iodine Rash  . Enalapril Other (See Comments)    TONGUE SWELLS/GOUT  . Iodinated Diagnostic Agents Other (See Comments)    BREAK OUT IN WHELPS     REVIEW OF SYSTEMS (Negative unless checked)  Constitutional: [] Weight loss  [] Fever  [] Chills Cardiac: [] Chest pain   [] Chest pressure   [] Palpitations   [x] Shortness of breath when laying flat   [x] Shortness of breath at rest   [x] Shortness of breath with exertion. Vascular:  [] Pain in legs with walking   [] Pain in legs at rest   [] Pain in legs when laying flat   [] Claudication   [] Pain in feet when walking  [] Pain in feet at rest  [] Pain in feet when laying flat   [] History of DVT   [] Phlebitis   [] Swelling in legs   [] Varicose veins   [] Non-healing ulcers Pulmonary:   [] Uses home oxygen   [] Productive cough   [] Hemoptysis   [] Wheeze  [] COPD   [] Asthma Neurologic:  [] Dizziness  [] Blackouts   [] Seizures   [] History of stroke   [] History of TIA  [] Aphasia   [] Temporary blindness   [] Dysphagia   [] Weakness or numbness in arms   [] Weakness or numbness in legs Musculoskeletal:  [] Arthritis   [] Joint swelling   [] Joint pain   [] Low back pain Hematologic:  [] Easy bruising  [] Easy bleeding   [] Hypercoagulable state   [] Anemic  [] Hepatitis Gastrointestinal:  [] Blood in stool   [] Vomiting blood  [] Gastroesophageal reflux/heartburn   [] Difficulty swallowing. Genitourinary:  [x] Chronic kidney disease   [] Difficult urination  [] Frequent urination  [] Burning with urination   [] Blood in urine Skin:  [] Rashes   [] Ulcers   [] Wounds Psychological:  [] History of anxiety   []  History of major depression.  Physical Examination  Vitals:   05/03/2019 0900 04/27/2019 1104 04/15/2019 1107  04/27/2019 1200  BP: 128/79 (!) 93/55 (!) 88/29 (!) 66/51  Pulse:    80   Resp: (!) 26   (!) 29  Temp:      TempSrc:      SpO2:  97%  100%  Weight:      Height:       Body mass index is 44.55 kg/m. Gen: Sedated. Intubated. Head: [place]/AT, No temporalis wasting Ear/Nose/Throat: Hearing grossly intact, nares w/o erythema or drainage Eyes: Sclera non-icteric, conjunctiva clear Neck: Supple, no nuchal rigidity.  No JVD.  Pulmonary:  Good air movement, clear to auscultation bilaterally.  Cardiac: RRR, normal S1, S2, no murmurs, rubs or gallops. Vascular: Extremities warm distally to toes Gastrointestinal: soft, non-tender/non-distended. No guarding/reflex.  Musculoskeletal: M/S 5/5 throughout.  Extremities without ischemic changes. No deformity or atrophy. Moderate Edema in the lower extremities bilaterally Neurologic: inact Psychiatric: Difficult to assess due to the severity of patient's illness. Dermatologic: No rashes or ulcers noted.   Lymph : No Cervical, Axillary, or Inguinal lymphadenopathy.  CBC Lab Results  Component Value Date   WBC 8.2 04/21/2019   HGB 12.1 04/17/2019   HCT 37.2 04/17/2019   MCV 90.1 04/23/2019   PLT 229 04/09/2019   BMET    Component Value Date/Time   NA 132 (L) 04/23/2019 0731   NA 129 (L) 03/31/2015 0420   K 7.2 (HH) 04/21/2019 0731   K 4.2 03/31/2015 0420   CL 94 (L) 04/29/2019 0731   CL 88 (L) 03/31/2015 0420   CO2 21 (L) 04/21/2019 0731   CO2 28 03/31/2015 0420   GLUCOSE 208 (H) 04/03/2019 0731   GLUCOSE 144 (H) 03/31/2015 0420   BUN 94 (H) 04/22/2019 0731   BUN 61 (H) 03/31/2015 0420   CREATININE 14.81 (H) 04/22/2019 0731   CREATININE 10.20 (H) 03/31/2015 0420   CALCIUM 8.3 (L) 04/15/2019 0731   CALCIUM 7.0 (LL) 03/31/2015 0420   GFRNONAA 2 (L) 05/02/2019 0731   GFRNONAA 4 (L) 03/31/2015 0420   GFRAA 3 (L) 04/26/2019 0731   GFRAA 4 (L) 03/31/2015 0420   Estimated Creatinine Clearance: 5.2 mL/min (A) (by C-G formula based on SCr of 14.81 mg/dL (H)).  COAG Lab Results  Component Value Date   INR 1.1  04/18/2019   INR 1.04 05/13/2017   INR 1.01 04/04/2017   Radiology Dg Chest Portable 1 View  Result Date: 04/08/2019 CLINICAL DATA:  Short of breath EXAM: PORTABLE CHEST 1 VIEW COMPARISON:  03/28/2015 FINDINGS: New left upper extremity and jugular hair 0 catheter with its tip in the upper SVC. Cardiomegaly. Normal vascularity. Bibasilar opacities likely combination of volume loss and pleural fluid. No pneumothorax. Triangular opacity at the medial right lung base is noted representing either consolidation in the right lower lobe or collapse. IMPRESSION: Right lower lobe collapse versus consolidation. Bibasilar opacities likely combination of pleural fluid and atelectasis. Electronically Signed   By: Marybelle Killings M.D.   On: 04/21/2019 08:04   Assessment/Plan The patient is a 63 year old female with a past medical history of diabetes, hypertension, SVC syndrome, end-stage renal disease on chronic hemodialysis who had dialysis approximately 5 days ago.  Most recently found to have a "clotted" dialysis access 3 days ago at her dialysis center.  She presents to the [facility] emergency department with worsening shortness of breath x2 days.  1. ESRD: Currently, the patient does not have an adequate dialysis access to dialyze at this time.  The patient's potassium is too high to move  forward with the PermCath insertion or declot. ICU staff has placed a temporary dialysis catheter to allow for immediate dialysis. Will plan on a declot with possible permcath insertion when the patient is stable. Will continue to follow.  2. Diabetes: On appropriate medications. Encouraged good control as its slows the progression of atherosclerotic disease. 3. Hypertension: On appropriate medications. Encouraged good control as its slows the progression of atherosclerotic disease.  Discussed with Dr. Mayme Genta, PA-C  05/02/2019 12:58 PM

## 2019-04-28 NOTE — H&P (Addendum)
[facility] NAME: [name]    MR#:  [id number]  DATE OF BIRTH:  Oct 17, 1956  DATE OF ADMISSION:  04/11/2019  PRIMARY CARE PHYSICIAN: Smithson, [name], MD   REQUESTING/REFERRING PHYSICIAN: Brenton Grills, MD  CHIEF COMPLAINT:   Chief Complaint  Patient presents with   Shortness of Breath    HISTORY OF PRESENT ILLNESS:  [name]  is a 63 y.o. female with a known history of CVA, OSA on CPAP, hypertension, gout, anemia of chronic disease, depression and anxiety, ESRD-HD on T,Th,Sa, diabetes mellitus, renal cell carcinoma, CHF, COPD, cholelithiasis, and PVD presenting to the ED with chief complaints of worsening shortness of breath since yesterday.  Patient reports onset of symptoms since yesterday and is progressively gotten worse last night without associated symptoms of cough, fever, chills, chest pain, nausea or vomiting.  She usually goes to dialysis on Tuesday, Thursday and Saturday.  However on Saturday, she was unable to be dialyzed due to clotted AV fistula.  Patient was scheduled to have the AV fistula declotted today at 11 but was unable to make it to the appointment due to worsening respiratory status she therefore called EMS.  On arrival to the ED, she was afebrile with blood pressure 117/61 mm Hg and pulse rate 82 beats/min, respiration 22, and sats 70 to 80% on 3 L which was increased to 4 L. Patient states she wears home oxygen at 3 L /[place]. There were no focal neurological deficits; She was alert and oriented x4, and he did not demonstrate any alteration of awareness.  Initial labs revealed sodium 132, potassium 7.2 mmol/L, BUN 94 creatinine 14.81 above baseline, alk phos 206, CBC unremarkable, SARS Coronavirus 2 negative.  She received calcium gluconate in the ED.  Chest x-ray showed right lower lobe collapse versus consolidation with bibasilar opacity likely combination of pleural fluid and atelectasis.   Patient was started on empiric antibiotics vancomycin and cefepime pending blood cultures.  Nephrology was also consulted stat who also evaluated patient in the ED for Vas-Cath placement and hemodialysis.  On initial evaluation in the ED patient was alert, oriented and able to provide her own history of presenting illness.  She was short of breath but in no apparent acute distress.  Unfortunately she had a witnessed cardiac arrest at around 10:34 am and CPR was initiated. After about 20 minutes of CPR and ACLS, bedside ultrasound showed an organized cardiac activity without pericardial effusion, so dopamine was started.  Subsequently the patient developed a peripheral pulse with a blood pressure of about 85/30.  She was intubated and transferred to the ICU post arrest.  PAST MEDICAL HISTORY:   Past Medical History:  Diagnosis Date   Anemia    Anxiety    Arthritis    Cancer ([place])    Left Kidney Cancer   CHF (congestive heart failure) (HCC)    Chronic kidney disease    COPD (chronic obstructive pulmonary disease) (HCC)    Use Oxygen at bedtime   Coronary artery disease    Diabetes mellitus without complication ([place])    Dialysis patient ([place])    Tues, Thurs, Sat   Dyspnea    with exertion   GERD (gastroesophageal reflux disease)    Gout    History of kidney stones    Hypertension    Neuropathy    Peripheral vascular disease ([place])    Sleep apnea    OSA--USE C-PAP   Stroke ([place]) 2004  PAST SURGICAL HISTORY:   Past Surgical History:  Procedure Laterality Date   A/V FISTULAGRAM Left 04/26/2017   Procedure: A/V Fistulagram;  Surgeon: Katha Cabal, MD;  Location: [facility];  Service: Cardiovascular;  Laterality: Left;   ABDOMINAL HYSTERECTOMY     CARDIAC CATHETERIZATION     DIALYSIS/PERMA CATHETER INSERTION N/A 01/31/2017   Procedure: Dialysis/Perma Catheter Insertion;  Surgeon: Algernon Huxley, MD;  Location: [facility];  Service:  Cardiovascular;  Laterality: N/A;   DIALYSIS/PERMA CATHETER INSERTION N/A 02/21/2017   Procedure: Dialysis/Perma Catheter Insertion;  Surgeon: Algernon Huxley, MD;  Location: [facility];  Service: Cardiovascular;  Laterality: N/A;   DIALYSIS/PERMA CATHETER INSERTION N/A 03/19/2017   Procedure: Dialysis/Perma Catheter Insertion;  Surgeon: Katha Cabal, MD;  Location: [facility];  Service: Cardiovascular;  Laterality: N/A;   DIALYSIS/PERMA CATHETER REMOVAL N/A 07/19/2017   Procedure: DIALYSIS/PERMA CATHETER REMOVAL;  Surgeon: Katha Cabal, MD;  Location: [facility];  Service: Cardiovascular;  Laterality: N/A;   EYE SURGERY Bilateral    Cataract Extraction with IOL   KIDNEY SURGERY Left    Partial Nephrectomy   LEFT HEART CATH AND CORONARY ANGIOGRAPHY N/A 04/05/2017   Procedure: Left Heart Cath and Coronary Angiography;  Surgeon: Wellington Hampshire, MD;  Location: [facility];  Service: Cardiovascular;  Laterality: N/A;   PERIPHERAL VASCULAR CATHETERIZATION N/A 01/23/2016   Procedure: Dialysis/Perma Catheter Insertion;  Surgeon: Algernon Huxley, MD;  Location: [facility];  Service: Cardiovascular;  Laterality: N/A;   PERIPHERAL VASCULAR CATHETERIZATION Left 10/01/2016   Procedure: A/V Shuntogram/Fistulagram;  Surgeon: Algernon Huxley, MD;  Location: [facility];  Service: Cardiovascular;  Laterality: Left;   PERIPHERAL VASCULAR THROMBECTOMY Left 11/24/2018   Procedure: PERIPHERAL VASCULAR THROMBECTOMY;  Surgeon: Algernon Huxley, MD;  Location: [facility];  Service: Cardiovascular;  Laterality: Left;   UPPER EXTREMITY VENOGRAPHY Bilateral 04/26/2017   Procedure: Upper Extremity Venography;  Surgeon: Katha Cabal, MD;  Location: [facility];  Service: Cardiovascular;  Laterality: Bilateral;   VASCULAR ACCESS DEVICE INSERTION Left 05/22/2017   Procedure: INSERTION OF HERO VASCULAR ACCESS DEVICE;  Surgeon: Katha Cabal, MD;  Location: [facility];  Service: Vascular;  Laterality: Left;    SOCIAL HISTORY:   Social History   Tobacco Use   Smoking status: Former Smoker   Smokeless tobacco: Never Used  Substance Use Topics   Alcohol use: No    FAMILY HISTORY:   Family History  Problem Relation Age of Onset   Hypertension Mother    Heart failure Mother    Diabetes Father    Heart attack Father    Lung cancer Maternal Aunt    Cancer Maternal Aunt     DRUG ALLERGIES:   Allergies  Allergen Reactions   Iodine Rash   Enalapril Other (See Comments)    TONGUE SWELLS/GOUT   Iodinated Diagnostic Agents Other (See Comments)    BREAK OUT IN WHELPS      REVIEW OF SYSTEMS:   ROS obtained from patient prior to cardiac arrest  Review of Systems  Constitutional: Negative for chills, fever, malaise/fatigue and weight loss.  HENT: Negative for congestion, hearing loss and sore throat.   Eyes: Negative for blurred vision and double vision.  Respiratory: Positive for shortness of breath and wheezing. Negative for cough, hemoptysis and sputum production.   Cardiovascular: Positive for orthopnea and leg swelling. Negative for chest pain  and palpitations.  Gastrointestinal: Positive for constipation. Negative for abdominal pain, blood in stool, diarrhea, melena, nausea and vomiting.       Abdominal distension  Genitourinary: Negative for dysuria and urgency.       Oliguria  Musculoskeletal: Positive for back pain. Negative for myalgias.  Skin: Positive for rash.  Neurological: Negative for dizziness, sensory change, speech change, focal weakness and headaches.  Psychiatric/Behavioral: Positive for depression. The patient is nervous/anxious.    MEDICATIONS AT HOME:   Prior to Admission medications   Medication Sig Start Date End Date Taking? Authorizing Provider  acetaminophen (TYLENOL 8 HOUR) 650 MG CR tablet Take 650 mg by mouth every 8 (eight) hours as needed for pain.   Yes [provider]  albuterol (PROVENTIL HFA;VENTOLIN HFA) 108 (90 Base) MCG/ACT inhaler Inhale 2 puffs into the lungs every 6 (six) hours as needed for wheezing or shortness of breath.    Yes [provider]  allopurinol (ZYLOPRIM) 100 MG tablet Take 150 mg by mouth See admin instructions. Take 1 tablets (185m) by mouth daily on Tuesday, Thursday and Saturday with dialysis   Yes [provider]  amitriptyline (ELAVIL) 25 MG tablet Take 25 mg by mouth at bedtime.  02/16/14  Yes [provider]  amLODipine (NORVASC) 10 MG tablet Take 10 mg by mouth daily.   Yes [provider]  atorvastatin (LIPITOR) 40 MG tablet Take 40 mg by mouth daily.    Yes [provider]  calcium carbonate (TUMS - DOSED IN MG ELEMENTAL CALCIUM) 500 MG chewable tablet Chew 1 tablet by mouth as needed for indigestion or heartburn.   Yes [provider]  carboxymethylcellulose (REFRESH PLUS) 0.5 % SOLN Place 1-2 drops into both eyes 3 (three) times daily as needed (for dry eyes).   Yes [provider]  citalopram (CELEXA) 40 MG tablet Take 40 mg by mouth daily.  02/16/14  Yes [provider]  clopidogrel (PLAVIX) 75 MG tablet Take 75 mg by mouth daily.  02/16/14  Yes [provider]  diclofenac sodium (VOLTAREN) 1 % GEL Apply 1 g topically 2 (two) times daily as needed (for pain.).   Yes [provider]  docusate sodium (STOOL SOFTENER) 100 MG capsule Take 200 mg by mouth 2 (two) times daily.    Yes [provider]  fluticasone (FLONASE) 50 MCG/ACT nasal spray Place 2 sprays into both nostrils daily.  02/16/14  Yes [provider]  hydroxypropyl methylcellulose / hypromellose (ISOPTO TEARS / GONIOVISC) 2.5 % ophthalmic solution Place 1-2 drops into both eyes 4 (four) times daily as needed for dry eyes.   Yes [provider]  lidocaine-prilocaine (EMLA) cream Apply 1 application topically as needed (prior to dialysis).    Yes [provider]  loratadine (CLARITIN) 10 MG tablet Take 10 mg by mouth at bedtime.   Yes [provider]  nystatin (MYCOSTATIN/NYSTOP) powder Apply 1 g topically daily as needed (for skin rashes.).  06/26/16  Yes [provider]  polyethylene glycol powder (GLYCOLAX/MIRALAX) powder Take 17 g by mouth 3 (three) times daily as needed for moderate constipation.    Yes [provider]  RENVELA 800 MG tablet Take 1,600 mg by mouth 3 (three) times daily with meals.    Yes [provider]  VICTOZA 18 MG/3ML SOPN Inject 1.2 mg into the skin daily.    Yes [provider]      VITAL SIGNS:  Blood pressure 128/79, pulse 82, temperature 98.2  F (36.8 C), temperature source Oral, resp. rate (!) 26, height _0  (1.676 m), weight 125.2 kg, SpO2 100 %.  PHYSICAL EXAMINATION:   Assessment done prior to cardiac arrest  GENERAL:  63 y.o.-year-old patient lying in the bed with mild SOB  EYES: Pupils equal, round, reactive to light and accommodation. No scleral icterus. Extraocular muscles intact.  HEENT: Head atraumatic, normocephalic. Oropharynx and nasopharynx clear.  NECK:  Supple, no jugular venous distention. No thyroid enlargement, no tenderness.  LUNGS: Normal breath sounds bilaterally, no wheezing, rales,rhonchi or crepitation. No use of accessory muscles of respiration.  CARDIOVASCULAR: S1, S2 normal. No murmurs, rubs, or gallops.  ABDOMEN: Soft, nontender, distended, bowel sounds present. No organomegaly or mass.  EXTREMITIES: Generalized edema, pitting edema of bilateral upper and lower extremity. NEUROLOGIC: Cranial nerves II through XII are intact. Muscle strength 5/5 in all extremities. Sensation intact. Gait not checked.  PSYCHIATRIC: The patient is alert and oriented x 3.  SKIN: No obvious rash, lesion, or ulcer.   DATA REVIEWED:  LABORATORY PANEL:   CBC Recent Labs  Lab 04/06/2019 0731  WBC 8.2  HGB 12.1  HCT 37.2  PLT 229     ------------------------------------------------------------------------------------------------------------------  Chemistries  Recent Labs  Lab 04/20/2019 0731  NA 132*  K 7.2*  CL 94*  CO2 21*  GLUCOSE 208*  BUN 94*  CREATININE 14.81*  CALCIUM 8.3*  AST 13*  ALT 16  ALKPHOS 206*  BILITOT 0.9   ------------------------------------------------------------------------------------------------------------------  Cardiac Enzymes No results for input(s): TROPONINI in the last 168 hours. ------------------------------------------------------------------------------------------------------------------  RADIOLOGY:  Dg Chest Portable 1 View  Result Date: 04/21/2019 CLINICAL DATA:  Short of breath EXAM: PORTABLE CHEST 1 VIEW COMPARISON:  03/28/2015 FINDINGS: New left upper extremity and jugular hair 0 catheter with its tip in the upper SVC. Cardiomegaly. Normal vascularity. Bibasilar opacities likely combination of volume loss and pleural fluid. No pneumothorax. Triangular opacity at the medial right lung base is noted representing either consolidation in the right lower lobe or collapse. IMPRESSION: Right lower lobe collapse versus consolidation. Bibasilar opacities likely combination of pleural fluid and atelectasis. Electronically Signed   By: Marybelle Killings M.D.   On: 04/03/2019 08:04    EKG:  EKG: normal EKG, normal sinus rhythm, unchanged from previous tracings. Vent. rate 81 BPM PR interval * ms QRS duration 119 ms QT/QTc 390/453 ms P-R-T axes 72 -37 68 IMPRESSION AND PLAN:   63 y.o. 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  1. Acute respiratory failure post PEA cardiopulmonary arrest  with ROSC  - Admit to ICU -  Full MV support and management per ICU team  2. ESRD on HD now with metabolic crisis due missed dialysis complicated by device failure - Last hemodialysis Last Thursday - Nephrology consult ordered for emergent renal replacement therapy - Vascular consult for thrombectomy once patient stable  3. Hyperkalemia -calcium gluconate given in the ED  - Emergent renal replacement therapy as above - Continue management and monitoring per ICU team  4. HCAP - X-ray shows right lower lobe collapse versus consolidation with bibasilar opacity  - Procalcitonin pending - Blood cultures pending - MRSA screening negative - SARS Coronavirus 2 - Continue cefepime  5. Anemia of CKD:  Hgb stable at  12.1.  Receiving Epogen to 8000 units with each treatment.   6. OSA - on CPAP at home - Currently intubated  7. Hypertension - Now hypotensive following cardiac arrest - BP meds on hold - Vasopressors per ICU team  8. COPD - No evidence of exacerbation on presentation - Ventilator support as above  9. DVT  Prophylaxis -SCDs   All the records are reviewed and case discussed with ED provider. Management plans discussed with the patient, family and they are in agreement.  CODE STATUS: FULL  TOTAL TIME TAKING CARE OF THIS PATIENT: 45 minutes.    on 04/18/2019 at 10:07 AM  This patient was staffed with Dr. Atha Starks, Manuella Ghazi who personally evaluated patient, reviewed documentation and agreed with assessment and plan of care as above.  Rufina Falco, DNP, FNP-BC [facility] Nurse Practitioner Between 7am to 6pm - Pager [phone]  After 6pm go to [email or web address] - password [email or web address] [facility]  Office  [phone]  CC: Primary care physician; Smithson, [name], MD

## 2019-04-28 NOTE — Procedures (Signed)
Central Venous Catheter Placement: TRIPLE LUMEN Dialysis catheter Indication: Patient receiving vesicant or irritant drug.; Patient receiving intravenous therapy for longer than 5 days.; Patient has limited or no vascular access.   Consent:emergent    Hand washing performed prior to starting the procedure.   Procedure:   An active timeout was performed and correct patient, name, & ID confirmed.   Patient was positioned correctly for central venous access.  Patient was prepped using strict sterile technique including chlorohexadine preps, sterile drape, sterile gown and sterile gloves.    The area was prepped, draped and anesthetized in the usual sterile manner. Patient comfort was obtained.    A triple lumen catheter was placed in Right Femoral Vein There was good blood return, catheter caps were placed on lumens, catheter flushed easily, the line was secured and a sterile dressing and BIO-PATCH applied.   Ultrasound was used to visualize vasculature and guidance of needle.   Number of Attempts: 1 Complications:none Estimated Blood Loss: 5cc Chest Radiograph indicated and ordered.  Operator: Lanney Gins MD      Ottie Glazier, M.D.  [facility] [place]

## 2019-04-28 NOTE — Consult Note (Signed)
CRITICAL CARE NOTE      CHIEF COMPLAINT:   Unresponsive state status post cardiac arrest with ACLS   HPI   This is a 63 year old lady with a history significant for ESRD with left upper extremity AV fistula, history of renal cell carcinoma on the left side, morbid obesity, anxiety disorder chronic anemia, COPD, coronary artery disease, diabetes, GERD, obstructive sleep apnea, was brought in from the ED to medical intensive care unit and hospitalist service has consulted critical care to manage for post cardiac arrest with ACLS and ESRD.  Patient is unable to give any history ,is unresponsive during evaluation.  Patient was apparently noted to be severely hypoxemic in the ED, noted to have severe hyperkalemia at 7.2 as well as chest x-ray with pleural effusions atelectasis and pulmonary edema, patient had septic work-up done and placed on empiric antibiotics.  While in the ED consultation was placed for nephrology as well as vascular surgery to attempt to place catheter for HD.  Subsequently patient clinically deteriorated and had sustained witnessed cardiac arrest with approximately 20 minutes of CPR and eventual ROSC as well as endotracheal intubation.  During evaluation patient had blood pressure 60 over 30s was placed on dopamine in the ED.  I had discussed case with family including husband who had multiple questions which we had answered and asked to please treat fully aggressive.  At dialysis catheter was placed on the left groin and patient was switched to Levophed and vasopressin for shock. PAST MEDICAL HISTORY   Past Medical History:  Diagnosis Date  . Anemia   . Anxiety   . Arthritis   . Cancer [facility])    Left Kidney Cancer  . CHF (congestive heart failure) ([place])   . Chronic kidney disease   . COPD (chronic  obstructive pulmonary disease) (HCC)    Use Oxygen at bedtime  . Coronary artery disease   . Diabetes mellitus without complication ([place])   . Dialysis patient ([place])    Tues, Thurs, Sat  . Dyspnea    with exertion  . GERD (gastroesophageal reflux disease)   . Gout   . History of kidney stones   . Hypertension   . Neuropathy   . Peripheral vascular disease ([place])   . Sleep apnea    OSA--USE C-PAP  . Stroke [facility]) 2004     SURGICAL HISTORY   Past Surgical History:  Procedure Laterality Date  . A/V FISTULAGRAM Left 04/26/2017   Procedure: A/V Fistulagram;  Surgeon: Katha Cabal, MD;  Location: [facility];  Service: Cardiovascular;  Laterality: Left;  . ABDOMINAL HYSTERECTOMY    . CARDIAC CATHETERIZATION    . DIALYSIS/PERMA CATHETER INSERTION N/A 01/31/2017   Procedure: Dialysis/Perma Catheter Insertion;  Surgeon: Algernon Huxley, MD;  Location: [place] [facility];  Service: Cardiovascular;  Laterality: N/A;  . DIALYSIS/PERMA CATHETER INSERTION N/A 02/21/2017   Procedure: Dialysis/Perma Catheter Insertion;  Surgeon: Algernon Huxley, MD;  Location: [place] [facility];  Service: Cardiovascular;  Laterality: N/A;  . DIALYSIS/PERMA CATHETER INSERTION N/A 03/19/2017   Procedure: Dialysis/Perma Catheter Insertion;  Surgeon: Katha Cabal, MD;  Location: [place] [facility];  Service: Cardiovascular;  Laterality: N/A;  . DIALYSIS/PERMA CATHETER REMOVAL N/A 07/19/2017   Procedure: DIALYSIS/PERMA CATHETER REMOVAL;  Surgeon: Katha Cabal, MD;  Location: [place] [facility];  Service: Cardiovascular;  Laterality: N/A;  . EYE SURGERY Bilateral    Cataract Extraction with IOL  . KIDNEY SURGERY Left    Partial  Nephrectomy  . LEFT HEART CATH AND CORONARY ANGIOGRAPHY N/A 04/05/2017   Procedure: Left Heart Cath and Coronary Angiography;  Surgeon: Wellington Hampshire, MD;  Location: [place] [facility];  Service: Cardiovascular;  Laterality: N/A;  . PERIPHERAL VASCULAR CATHETERIZATION  N/A 01/23/2016   Procedure: Dialysis/Perma Catheter Insertion;  Surgeon: Algernon Huxley, MD;  Location: [place] [facility];  Service: Cardiovascular;  Laterality: N/A;  . PERIPHERAL VASCULAR CATHETERIZATION Left 10/01/2016   Procedure: A/V Shuntogram/Fistulagram;  Surgeon: Algernon Huxley, MD;  Location: [place] [facility];  Service: Cardiovascular;  Laterality: Left;  . PERIPHERAL VASCULAR THROMBECTOMY Left 11/24/2018   Procedure: PERIPHERAL VASCULAR THROMBECTOMY;  Surgeon: Algernon Huxley, MD;  Location: [place] [facility];  Service: Cardiovascular;  Laterality: Left;  . UPPER EXTREMITY VENOGRAPHY Bilateral 04/26/2017   Procedure: Upper Extremity Venography;  Surgeon: Katha Cabal, MD;  Location: [place] [facility];  Service: Cardiovascular;  Laterality: Bilateral;  . VASCULAR ACCESS DEVICE INSERTION Left 05/22/2017   Procedure: INSERTION OF HERO VASCULAR ACCESS DEVICE;  Surgeon: Katha Cabal, MD;  Location: [facility];  Service: Vascular;  Laterality: Left;     FAMILY HISTORY   Family History  Problem Relation Age of Onset  . Hypertension Mother   . Heart failure Mother   . Diabetes Father   . Heart attack Father   . Lung cancer Maternal Aunt   . Cancer Maternal Aunt      SOCIAL HISTORY   Social History   Tobacco Use  . Smoking status: Former Research scientist (life sciences)  . Smokeless tobacco: Never Used  Substance Use Topics  . Alcohol use: No  . Drug use: No     MEDICATIONS   Current Medication:  Current Facility-Administered Medications:  .  albuterol (PROVENTIL) (2.5 MG/3ML) 0.083% nebulizer solution 2.5 mg, 2.5 mg, Nebulization, Q6H PRN, Manuella Ghazi, Vipul, MD .  allopurinol (ZYLOPRIM) tablet 150 mg, 150 mg, Oral, Once per day on Tue Thu Sat, O[place], Elizabeth Achieng, NP .  amitriptyline (ELAVIL) tablet 25 mg, 25 mg, Oral, QHS, Ouma, Bing Neighbors, NP .  amLODipine (NORVASC) tablet 10 mg, 10 mg, Oral, Daily, Ouma, Bing Neighbors, NP .  Derrill Memo ON 04/29/2019] atorvastatin (LIPITOR)  tablet 40 mg, 40 mg, Oral, Daily, Ouma, Bing Neighbors, NP .  calcium carbonate (TUMS - dosed in mg elemental calcium) chewable tablet 200 mg of elemental calcium, 1 tablet, Oral, PRN, Lang Snow, NP .  calcium chloride 10 % injection, , , ,  .  [START ON 04/29/2019] ceFEPIme (MAXIPIME) 1 g in sodium chloride 0.9 % 100 mL IVPB, 1 g, Intravenous, Q24H, Vira Blanco, RPH .  citalopram (CELEXA) tablet 40 mg, 40 mg, Oral, Daily, Ouma, Bing Neighbors, NP .  clopidogrel (PLAVIX) tablet 75 mg, 75 mg, Oral, Daily, Ouma, Bing Neighbors, NP .  docusate sodium (COLACE) capsule 200 mg, 200 mg, Oral, BID, Ouma, Bing Neighbors, NP .  fluticasone (FLONASE) 50 MCG/ACT nasal spray 2 spray, 2 spray, Each Nare, Daily, Ouma, Bing Neighbors, NP .  heparin injection 5,000 Units, 5,000 Units, Subcutaneous, Q8H, Ouma, Elizabeth Achieng, NP .  insulin aspart (novoLOG) 100 UNIT/ML injection, , , ,  .  lidocaine-prilocaine (EMLA) cream 1 application, 1 application, Topical, PRN, Ouma, Bing Neighbors, NP .  liraglutide (VICTOZA) SOPN 1.2 mg, 1.2 mg, Subcutaneous, Daily, Ouma, Bing Neighbors, NP .  loratadine (CLARITIN) tablet 10 mg, 10 mg, Oral, QHS, Ouma, Bing Neighbors, NP .  nystatin (MYCOSTATIN/NYSTOP) topical powder 1 g, 1 g, Topical, Daily PRN,  Lang Snow, NP .  ondansetron [facility]) 4 MG/2ML injection, , , ,  .  polyethylene glycol powder (GLYCOLAX/MIRALAX) container 17 g, 17 g, Oral, TID PRN, Ouma, Bing Neighbors, NP .  polyvinyl alcohol (LIQUIFILM TEARS) 1.4 % ophthalmic solution 1-2 drop, 1-2 drop, Both Eyes, QID PRN, Ouma, Bing Neighbors, NP .  sevelamer carbonate (RENVELA) tablet 1,600 mg, 1,600 mg, Oral, TID WC, Ouma, Bing Neighbors, NP .  sodium bicarbonate 1 mEq/mL injection, , , ,  .  vancomycin (VANCOCIN) IVPB 1000 mg/200 mL premix, 1,000 mg, Intravenous, Once, Vira Blanco, RPH    ALLERGIES   Iodine; Enalapril; and Iodinated diagnostic  agents    REVIEW OF SYSTEMS     Unable to obtain due to unresponsive state  PHYSICAL EXAMINATION   Vitals:   04/27/2019 1107 05/01/2019 1200  BP: (!) 88/29 (!) 66/51  Pulse:  80  Resp:  (!) 29  Temp:    SpO2:  100%    GENERAL: Comatose HEAD: Normocephalic, atraumatic.  EYES: Pupils equal, round, reactive to light.  No scleral icterus.  MOUTH: Moist mucosal membrane. NECK: Supple. No thyromegaly. No nodules. No JVD.  PULMONARY: Bilateral crackles with decreased breath sounds CARDIOVASCULAR: S1 and S2. Regular rate and rhythm. No murmurs, rubs, or gallops.  GASTROINTESTINAL: Soft, nontender, non-distended. No masses. Positive bowel sounds. No hepatosplenomegaly.  MUSCULOSKELETAL: No swelling, clubbing, or edema.  NEUROLOGIC: Mild distress due to acute illness SKIN:intact,warm,dry   LABS AND IMAGING     LAB RESULTS: Recent Labs  Lab 05/03/2019 0731  NA 132*  K 7.2*  CL 94*  CO2 21*  BUN 94*  CREATININE 14.81*  GLUCOSE 208*   Recent Labs  Lab 04/11/2019 0731  HGB 12.1  HCT 37.2  WBC 8.2  PLT 229     IMAGING RESULTS: Dg Chest Portable 1 View  Result Date: 04/21/2019 CLINICAL DATA:  Short of breath EXAM: PORTABLE CHEST 1 VIEW COMPARISON:  03/28/2015 FINDINGS: New left upper extremity and jugular hair 0 catheter with its tip in the upper SVC. Cardiomegaly. Normal vascularity. Bibasilar opacities likely combination of volume loss and pleural fluid. No pneumothorax. Triangular opacity at the medial right lung base is noted representing either consolidation in the right lower lobe or collapse. IMPRESSION: Right lower lobe collapse versus consolidation. Bibasilar opacities likely combination of pleural fluid and atelectasis. Electronically Signed   By: Marybelle Killings M.D.   On: 04/17/2019 08:04      ASSESSMENT AND PLAN    -Multidisciplinary rounds held today  Acute comatose state    -Status post endotracheal intubation -continue Full MV support -continue  Bronchodilator Therapy -Wean Fio2 and PEEP as tolerated -will perform SAT/SBT when respiratory parameters are met -Discussed with Husband Tommy Kaseman    Severe hyperkalemia      - HCO3-, insulin, Veltasa, Calcium gluconate    - discussed with Nephro - starting dialysis emergently -oxygen as needed -follow up cardiac enzymes as indicated ICU monitoring    NSTEMI    -Status post cardiac arrest and ACLS    -Trending troponin 0 0.53     -Empirically initiating heparin drip    Renal Failure-Renal failure ESRD     - starting CRRT - L trialysis cath  nephro on case - appreciate input -follow chem 7 -follow UO -continue Foley Catheter-assess need daily   NEUROLOGY - intubated and sedated - minimal sedation to achieve a RASS goal: -1 Wake up assessment pending    ID -continue IV abx as prescibed -follow  up cultures  GI/Nutrition GI PROPHYLAXIS as indicated DIET-->TF's as tolerated Constipation protocol as indicated  ENDO - ICU hypoglycemic\Hyperglycemia protocol -check FSBS per protocol   ELECTROLYTES -follow labs as needed -replace as needed -pharmacy consultation   DVT/GI PRX ordered -SCDs  TRANSFUSIONS AS NEEDED MONITOR FSBS ASSESS the need for LABS as needed   Critical care provider statement:    Critical care time (minutes):  109   Critical care time was exclusive of:  Separately billable procedures and treating other patients   Critical care was necessary to treat or prevent imminent or life-threatening deterioration of the following conditions:   Circulatory shock, cardiac arrest, acute comatose state, severe hyperkalemia, AV graft fistula malfunction, fluid overload, diabetes, OSA, multiple comorbid conditions   Critical care was time spent personally by me on the following activities:  Development of treatment plan with patient or surrogate, discussions with consultants, evaluation of patient's response to treatment, examination of patient,  obtaining history from patient or surrogate, ordering and performing treatments and interventions, ordering and review of laboratory studies and re-evaluation of patient's condition.  I assumed direction of critical care for this patient from another provider in my specialty: no    This document was prepared using Dragon voice recognition software and may include unintentional dictation errors.    Ottie Glazier, M.D.  Division of [place]

## 2019-04-28 NOTE — Code Documentation (Signed)
Dr Joni Fears preparing for central line

## 2019-04-28 NOTE — ED Provider Notes (Addendum)
[facility] Emergency Department Provider Note  ____________________________________________  Time seen: Approximately 9:23 AM  I have reviewed the triage vital signs and the nursing notes.   HISTORY  Chief Complaint Shortness of Breath    HPI [name] is a 63 y.o. female with a history of CHF COPD diabetes hypertension end-stage renal disease on hemodialysis who complains of shortness of breath that is worsened over the past 2 days, gradually.  No cough fevers chills body aches.  No known contact with any sick people.  Last dialysis was 5 days ago.  When she went to her dialysis 3 days ago, her left upper extremity graft was found to be clotted and nonfunctional.  She has an appointment with vascular surgery this morning at 11:00 for evaluation.  She normally wears 3 L nasal cannula oxygen at all times.  EMS found her initial oxygen saturation to be about 80% and increased her to 4 to 5 L nasal cannula.  Denies chest pain.      Past Medical History:  Diagnosis Date  . Anemia   . Anxiety   . Arthritis   . Cancer [facility])    Left Kidney Cancer  . CHF (congestive heart failure) ([place])   . Chronic kidney disease   . COPD (chronic obstructive pulmonary disease) (H[facility])    Use Oxygen at bedtime  . Coronary artery disease   . Diabetes mellitus without complication ([place])   . Dialysis patient ([place])    Tues, Thurs, Sat  . Dyspnea    with exertion  . GERD (gastroesophageal reflux disease)   . Gout   . History of kidney stones   . Hypertension   . Neuropathy   . Peripheral vascular disease ([place])   . Sleep apnea    OSA--USE C-PAP  . Stroke [facility]) 2004     Patient Active Problem List   Diagnosis Date Noted  . Pain in joint, upper arm 07/16/2018  . Left arm pain 07/16/2018  . 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 Complication of renal dialysis device 05/06/2017  . Abnormal stress test   . Cerebrovascular accident, old 06/21/2014  . CAFL (chronic airflow  limitation) ([place]) 04/09/2014  . Gout 02/16/2014  . BP (high blood pressure) 02/16/2014  . Diabetes ([place]) 09/28/2013  . Dermatophytoses 09/28/2013  . ESRD on dialysis ([place]) 09/28/2013  . Biliary calculi 05/03/2011  . Idiopathic localized osteoarthropathy 02/12/2011     Past Surgical History:  Procedure Laterality Date  . A/V FISTULAGRAM Left 04/26/2017   Procedure: A/V Fistulagram;  Surgeon: Katha Cabal, MD;  Location: [place] [facility];  Service: Cardiovascular;  Laterality: Left;  . ABDOMINAL HYSTERECTOMY    . CARDIAC CATHETERIZATION    . DIALYSIS/PERMA CATHETER INSERTION N/A 01/31/2017   Procedure: Dialysis/Perma Catheter Insertion;  Surgeon: Algernon Huxley, MD;  Location: [place] [facility];  Service: Cardiovascular;  Laterality: N/A;  . DIALYSIS/PERMA CATHETER INSERTION N/A 02/21/2017   Procedure: Dialysis/Perma Catheter Insertion;  Surgeon: Algernon Huxley, MD;  Location: [place] [facility];  Service: Cardiovascular;  Laterality: N/A;  . DIALYSIS/PERMA CATHETER INSERTION N/A 03/19/2017   Procedure: Dialysis/Perma Catheter Insertion;  Surgeon: Katha Cabal, MD;  Location: [place] [facility];  Service: Cardiovascular;  Laterality: N/A;  . DIALYSIS/PERMA CATHETER REMOVAL N/A 07/19/2017   Procedure: DIALYSIS/PERMA CATHETER REMOVAL;  Surgeon: Katha Cabal, MD;  Location: [place] [facility];  Service: Cardiovascular;  Laterality: N/A;  . EYE SURGERY Bilateral    Cataract Extraction with  IOL  . KIDNEY SURGERY Left    Partial Nephrectomy  . LEFT HEART CATH AND CORONARY ANGIOGRAPHY N/A 04/05/2017   Procedure: Left Heart Cath and Coronary Angiography;  Surgeon: Wellington Hampshire, MD;  Location: [place] [facility];  Service: Cardiovascular;  Laterality: N/A;  . PERIPHERAL VASCULAR CATHETERIZATION N/A 01/23/2016   Procedure: Dialysis/Perma Catheter Insertion;  Surgeon: Algernon Huxley, MD;  Location: [place] [facility];  Service: Cardiovascular;  Laterality: N/A;  . PERIPHERAL  VASCULAR CATHETERIZATION Left 10/01/2016   Procedure: A/V Shuntogram/Fistulagram;  Surgeon: Algernon Huxley, MD;  Location: [place] [facility];  Service: Cardiovascular;  Laterality: Left;  . PERIPHERAL VASCULAR THROMBECTOMY Left 11/24/2018   Procedure: PERIPHERAL VASCULAR THROMBECTOMY;  Surgeon: Algernon Huxley, MD;  Location: [place] [facility];  Service: Cardiovascular;  Laterality: Left;  . UPPER EXTREMITY VENOGRAPHY Bilateral 04/26/2017   Procedure: Upper Extremity Venography;  Surgeon: Katha Cabal, MD;  Location: [place] [facility];  Service: Cardiovascular;  Laterality: Bilateral;  . VASCULAR ACCESS DEVICE INSERTION Left 05/22/2017   Procedure: INSERTION OF HERO VASCULAR ACCESS DEVICE;  Surgeon: Katha Cabal, MD;  Location: [facility];  Service: Vascular;  Laterality: Left;     Prior to Admission medications   Medication Sig Start Date End Date Taking? Authorizing Provider  acetaminophen (TYLENOL 8 HOUR) 650 MG CR tablet Take 650 mg by mouth every 8 (eight) hours as needed for pain.    [provider]  albuterol (PROVENTIL HFA;VENTOLIN HFA) 108 (90 Base) MCG/ACT inhaler Inhale into the lungs. 04/10/17   [provider]  allopurinol (ZYLOPRIM) 100 MG tablet Take 150 mg by mouth Every Tuesday,Thursday,and Saturday with dialysis.     [provider]  amitriptyline (ELAVIL) 25 MG tablet Take 25 mg by mouth at bedtime.  02/16/14   [provider]  amLODipine (NORVASC) 10 MG tablet Take 10 mg by mouth daily.    [provider]  atorvastatin (LIPITOR) 40 MG tablet Take 40 mg by mouth daily at 12 noon. 09/07/16   [provider]  calcium carbonate (TUMS - DOSED IN MG ELEMENTAL CALCIUM) 500 MG chewable tablet Chew 1 tablet by mouth as needed for indigestion or heartburn.    [provider]  carboxymethylcellulose (REFRESH PLUS) 0.5 % SOLN Place 1-2 drops into both eyes 3 (three) times daily as needed (for dry eyes).    [provider]  citalopram (CELEXA) 40 MG tablet Take 40 mg by mouth daily.  02/16/14   [provider]  clopidogrel (PLAVIX) 75 MG tablet Take 75 mg by mouth daily.  02/16/14   [provider]  diclofenac sodium (VOLTAREN) 1 % GEL Apply 1 g topically 2 (two) times daily as needed (for pain.).    [provider]  docusate sodium (STOOL SOFTENER) 100 MG capsule Take 200 mg by mouth daily.  02/16/14   [provider]  fluticasone (FLONASE) 50 MCG/ACT nasal spray Place 2 sprays into both nostrils daily.  02/16/14   [provider]  hydroxypropyl methylcellulose / hypromellose (ISOPTO TEARS / GONIOVISC) 2.5 % ophthalmic solution Place 1-2 drops into both eyes 4 (four) times daily as needed for dry eyes.    [provider]  lidocaine-prilocaine (EMLA) cream Apply 1 application topically as needed (prior to dialysis).    [provider]  loratadine (CLARITIN) 10 MG tablet Take 10 mg by mouth at bedtime.    [provider]  Misc. Devices [facility]) MISC Frequency:PHARMDIR   Dosage:0.0  Instructions:  Note:Dx: Deconditioning with orthostatic hypotension Dose: 1 07/02/12   [provider]  nystatin (MYCOSTATIN/NYSTOP) powder Apply 1 g topically daily as needed (for skin rashes.).  06/26/16   [provider]  OXYGEN Inhale 2 L into the lungs at bedtime.    [provider]  polyethylene glycol powder (GLYCOLAX/MIRALAX) powder Take 17 g by mouth daily. With coffee 06/12/14   [provider]  ranitidine (ZANTAC) 150 MG tablet Take 150 mg by mouth daily.  09/26/16   [provider]  RENVELA 800 MG tablet Take 800 mg by mouth 3 (three) times daily with meals.  04/16/17   [provider]  UNIFINE PENTIPS 31G X 6 MM MISC USE WITH VICTOZA ONCE A DAY 11/10/18   [provider]  VICTOZA 18 MG/3ML SOPN Inject 0.3 mL (1.8 mg total) under the skin daily. 04/11/17   [provider]      Allergies Iodine; Enalapril; and Iodinated diagnostic agents   Family History  Problem Relation Age of Onset  . Hypertension Mother   . Heart failure Mother   . Diabetes Father   . Heart attack Father   . Lung cancer Maternal Aunt   . Cancer Maternal Aunt     Social History Social History   Tobacco Use  . Smoking status: Former Research scientist (life sciences)  . Smokeless tobacco: Never Used  Substance Use Topics  . Alcohol use: No  . Drug use: No    Review of Systems  Constitutional:   No fever or chills.  ENT:   No sore throat. No rhinorrhea. Cardiovascular:   No chest pain or syncope. Respiratory:   Positive shortness of breath without cough. Gastrointestinal:   Negative for abdominal pain, vomiting and diarrhea.  Musculoskeletal:   Negative for focal pain or swelling All other systems reviewed and are negative except as documented above in ROS and HPI.  ____________________________________________   PHYSICAL EXAM:  VITAL SIGNS: ED Triage Vitals  Enc Vitals Group     BP 04/27/2019 0719 117/61     Pulse Rate 04/06/2019 0719 82     Resp 04/27/2019 0719 (!) 22     Temp 04/27/2019 0721 98.2 F (36.8 C)     Temp Source 04/27/2019 0719 Oral     SpO2 04/27/2019 0719 100 %     Weight 04/25/2019 0719 276 lb (125.2 kg)     Height 04/08/2019 0719 5\' 6"  (1.676 m)     Head Circumference --      Peak Flow --      Pain Score 04/11/2019 0741 0     Pain Loc --      Pain Edu? --      Excl. in [place]? --     Vital signs reviewed, nursing assessments reviewed.   Constitutional:   Alert and oriented. Non-toxic appearance. Eyes:   Conjunctivae are normal. EOMI. PERRL. ENT      Head:   Normocephalic and atraumatic.      Nose:   No congestion/rhinnorhea.       Mouth/Throat:   MMM, no pharyngeal erythema. No peritonsillar mass.       Neck:   No meningismus. Full ROM. Hematological/Lymphatic/Immunilogical:   No cervical lymphadenopathy. Cardiovascular:   RRR. Symmetric bilateral radial and DP pulses.  No  murmurs. Cap refill less than 2 seconds.  Left upper extremity AV graft with pulse, no thrill. Respiratory:   Tachypnea.  Normal work of breathing.  Bibasilar crackles.  Symmetric breath sounds.  No wheezing.  Gastrointestinal:   Soft and nontender. Non distended. There is no CVA tenderness.  No rebound, rigidity, or guarding.  Musculoskeletal:   Normal range of motion in all extremities. No joint effusions.  No lower extremity tenderness.  No edema. Neurologic:   Normal speech and language.  Motor grossly intact. No acute focal neurologic deficits are appreciated.  Skin:    Skin is warm, dry and intact. No rash noted.  No petechiae, purpura, or bullae.  ____________________________________________    LABS (pertinent positives/negatives) (all labs ordered are listed, but only abnormal results are displayed) Labs Reviewed  COMPREHENSIVE METABOLIC PANEL - Abnormal; Notable for the following components:      Result Value   Sodium 132 (*)    Potassium 7.2 (*)    Chloride 94 (*)    CO2 21 (*)    Glucose, Bld 208 (*)    BUN 94 (*)    Creatinine, Ser 14.81 (*)    Calcium 8.3 (*)    AST 13 (*)    Alkaline Phosphatase 206 (*)    GFR calc non Af Amer 2 (*)    GFR calc Af Amer 3 (*)    Anion gap 17 (*)    All other components within normal limits  CBC WITH DIFFERENTIAL/PLATELET - Abnormal; Notable for the following components:   RDW 16.8 (*)    All other components within normal limits  LIPASE, BLOOD - Abnormal; Notable for the following components:   Lipase 59 (*)    All other components within normal limits  GLUCOSE, CAPILLARY - Abnormal; Notable for the following components:   Glucose-Capillary 170 (*)    All other components within normal limits  SARS CORONAVIRUS 2 (HOSPITAL ORDER, [place] [facility])  MRSA PCR SCREENING  PROTIME-INR  GLUCOSE, RANDOM  PROCALCITONIN   ____________________________________________   EKG  Interpreted by me Sinus rhythm  rate of 81, normal axis and intervals.  Poor R wave progression.  Normal ST segments and T waves.  ____________________________________________    [id number]  Dg Chest Portable 1 View  Result Date: 04/22/2019 CLINICAL DATA:  Short of breath EXAM: PORTABLE CHEST 1 VIEW COMPARISON:  03/28/2015 FINDINGS: New left upper extremity and jugular hair 0 catheter with its tip in the upper SVC. Cardiomegaly. Normal vascularity. Bibasilar opacities likely combination of volume loss and pleural fluid. No pneumothorax. Triangular opacity at the medial right lung base is noted representing either consolidation in the right lower lobe or collapse. IMPRESSION: Right lower lobe collapse versus consolidation. Bibasilar opacities likely combination of pleural fluid and atelectasis. Electronically Signed   By: Marybelle Killings M.D.   On: 04/24/2019 08:04    ____________________________________________   PROCEDURES .Critical Care Performed by: Carrie Mew, MD Authorized by: Carrie Mew, MD   Critical care provider statement:    Critical care time (minutes):  65   Critical care time was exclusive of:  Separately billable procedures and treating other patients   Critical care was necessary to treat or prevent imminent or life-threatening deterioration of the following conditions:  Renal failure, metabolic crisis, circulatory failure and cardiac failure   Critical care was time spent personally by me on the following activities:  Development of treatment plan with patient or surrogate, discussions with consultants, evaluation of patient's response to treatment, examination of patient, obtaining history from patient or surrogate, ordering and performing treatments and interventions, ordering and review of laboratory studies, ordering and review of radiographic studies, pulse oximetry, re-evaluation of patient's condition and review  of old charts Comments:     Management of hyperkalemia and subsequent PEA  cardiac arrest  Procedure Name: Intubation Date/Time: 04/30/2019 11:15 AM Performed by: Carrie Mew, MD Pre-anesthesia Checklist: Emergency Drugs available and Suction available Oxygen Delivery Method: Ambu bag Preoxygenation: Pre-oxygenation with 100% oxygen Ventilation: Two handed mask ventilation required, Mask ventilation with difficulty and Oral airway inserted - appropriate to patient size Laryngoscope Size: Glidescope and 4 Grade View: Grade I Tube type: Subglottic suction tube Tube size: 7.5 mm Number of attempts: 1 Placement Confirmation: ETT inserted through vocal cords under direct vision,  CO2 detector and Breath sounds checked- equal and bilateral Secured at: 24 cm Tube secured with: Tape Difficulty Due To: Difficulty was anticipated Comments: Intubated emergently due to cardiac arrest. Difficulty to ventilate due to morbid obesity. No paralytics or sedatives used. Dentures removed prior to intubation.       Linus Salmons Line Date/Time: 04/07/2019 11:17 AM Performed by: Carrie Mew, MD Authorized by: Carrie Mew, MD   Consent:    Consent obtained:  Emergent situation Pre-procedure details:    Hand hygiene: Hand hygiene performed prior to insertion     All elements of maximal sterile barrier technique followed: cardiac arrest , emergent access attempt.     Skin preparation:  Alcohol   Skin preparation agent: Skin preparation agent completely dried prior to procedure   Anesthesia (see MAR for exact dosages):    Anesthesia method:  None Procedure details:    Location:  R femoral   Site selection rationale:  Cardiac arrest / cpr   Patient position:  Flat   Procedural supplies:  Triple lumen   Landmarks identified: yes     Ultrasound guidance: yes     Number of attempts:  2   Successful placement: no   Comments:     Cannulated R fem vein with finder needle, unable to advance wire. ROSC achieved mid-procedure, so due to difficulty, procedure  terminated and pt transported to ICU for ongoing care.    ____________________________________________  DIFFERENTIAL DIAGNOSIS   Pneumonia, pulmonary edema, pleural effusion, hyperkalemia.  Doubt pneumothorax pulmonary embolism or ACS.  Doubt dissection or AAA.  CLINICAL IMPRESSION / ASSESSMENT AND PLAN / ED COURSE  Medications ordered in the ED: Medications  vancomycin (VANCOCIN) IVPB 1000 mg/200 mL premix (has no administration in time range)  ceFEPIme (MAXIPIME) 2 g in sodium chloride 0.9 % 100 mL IVPB (2 g Intravenous New Bag/Given 04/22/2019 0901)  calcium gluconate inj 10% (1 g) URGENT USE ONLY! (has no administration in time range)  insulin aspart (novoLOG) injection 10 Units (has no administration in time range)    And  dextrose 50 % solution 100 mL (has no administration in time range)  sodium bicarbonate injection 50 mEq (has no administration in time range)  ondansetron (ZOFRAN) 4 MG/2ML injection (has no administration in time range)  insulin aspart (novoLOG) 100 UNIT/ML injection (has no administration in time range)  ondansetron (ZOFRAN) injection 4 mg (4 mg Intravenous Given 04/18/2019 0912)    Pertinent labs & imaging results that were available during my care of the patient were reviewed by me and considered in my medical decision making (see chart for details).  Jenene A Nishida was evaluated in Emergency Department on 04/29/2019 for the symptoms described in the history of present illness. She was evaluated in the context of the global COVID-19 pandemic, which necessitated consideration that the patient might be at risk for infection with the SARS-CoV-2 virus that causes COVID-19. Institutional protocols and  algorithms that pertain to the evaluation of patients at risk for COVID-19 are in a state of rapid change based on information released by regulatory bodies including the CDC and federal and state organizations. These policies and algorithms were followed during the  patient's care in the ED.     Clinical Course as of Apr 27 922  Tue Apr 28, 2019  0820 Chest x-ray shows basilar opacities and right lower lobe consolidation concerning for healthcare associated pneumonia.  I will start cefepime and vancomycin.  Still waiting for results of chemistry panel.  We will plan for hospitalization.   [PS]  0914 Severe hyperkalemia. Will give insulin, glucose, bicarb, calcium. Covid negative. Will page vascular and nephrology  Potassium(!!): 7.2 [PS]  0919 D/w neph. [place], will plan for emergent dialysis once access obtained   [PS]    Clinical Course User Index [PS] Carrie Mew, MD    ----------------------------------------- 11:20 AM on 05/02/2019 -----------------------------------------  When nurse went to check fingerstick after the patient received IV insulin for hyperkalemia treatment, patient reported that she felt funny in her chest and and subsequently became unresponsive, witnessed, at about 10:31 AM.  She coming to the bedside, assessed the patient immediately, found her to be in cardiac arrest with agonal breathing, sinus bradycardia rate of 60 on the monitor, likely PEA.  CODE BLUE initiated, called staff code cart glide scope to bedside, CPR initiated immediately by me, personally performed.  see code documentation for further details, patient was given a total of 4 or 5 amps of calcium chloride, multiple calcium bicarb, 5 rounds of epinephrine.  Patient was intubated early due to morbid obesity, large neck soft tissue and difficulty ventilating even with an oral airway in place.    After about 20 minutes of CPR and ACLS, bedside ultrasound showed an organized cardiac activity without pericardial effusion, so dopamine was started.  Subsequently the patient developed a peripheral pulse with a blood pressure of about 85/30.  ROSC was obtained in the midst of attempting an emergent right femoral central line placement for greater access which was  unsuccessful.  Due to the availability of ICU bed and resources, and spontaneous circulation, decision made to terminate the unsuccessful central line placement and transport to the ICU for ongoing care.  Hospitalists came to bedside and were informed and plan to contact the patient's spouse.  No paralytics or sedatives were given.  Prior to transport to ICU, patient showed no signs of responsiveness after achieving spontaneous circulation, will need consideration of targeted temperature management.     ____________________________________________   FINAL CLINICAL IMPRESSION(S) / ED DIAGNOSES    Final diagnoses:  ESRD on hemodialysis ([place])  Clotted dialysis access, initial encounter ([place])  Hyperkalemia  HCAP (healthcare-associated pneumonia)     ED Discharge Orders    None      Portions of this note were generated with dragon dictation software. Dictation errors may occur despite best attempts at proofreading.   Carrie Mew, MD 05/02/2019 1025    Carrie Mew, MD 04/22/2019 1126

## 2019-04-28 NOTE — Consult Note (Signed)
[facility]  CONSULT NOTE    Date: 04/04/2019                  Patient Name:  [name]  MRN: [id number]  DOB: 1956/01/01  Age / Sex: 63 y.o., female         PCP: Smithson, [name], MD                 Service Requesting Consult: Dr. Joni Fears                 Reason for Consult: End Stage Renal Disease            History of Present Illness: [name] presents with ED with complication of dialysis AVG. Patient's last hemodialysis was Thursday. She presented Saturday with clotted access. She was then asked to be seen by vascular, her appointment was this morning. However she presents to the ED for shortness of breath.   Patient went into cardiopulmonary arrest in the ED. Now intubated, sedated and requiring vasopressors.   Medications: Outpatient medications: Facility-Administered Medications Prior to Admission  Medication Dose Route Frequency Provider Last Rate Last Dose  . HYDROmorphone (DILAUDID) injection 1 mg  1 mg Intravenous Q1H PRN Schnier, Dolores Lory, MD      . ondansetron [facility]) injection 4 mg  4 mg Intravenous Once Schnier, Dolores Lory, MD       Medications Prior to Admission  Medication Sig Dispense Refill Last Dose  . acetaminophen (TYLENOL 8 HOUR) 650 MG CR tablet Take 650 mg by mouth every 8 (eight) hours as needed for pain.   Unknown at PRN  . albuterol (PROVENTIL HFA;VENTOLIN HFA) 108 (90 Base) MCG/ACT inhaler Inhale 2 puffs into the lungs every 6 (six) hours as needed for wheezing or shortness of breath.    Unknown at PRN  . allopurinol (ZYLOPRIM) 100 MG tablet Take 150 mg by mouth See admin instructions. Take 1 tablets (150mg ) by mouth daily on Tuesday, Thursday and Saturday with dialysis   04/25/2019 at Unknown  . amitriptyline (ELAVIL) 25 MG tablet Take 25 mg by mouth at bedtime.    04/27/2019 at 2000  . amLODipine (NORVASC) 10 MG tablet Take 10 mg by mouth daily.   04/27/2019 at 0800  . atorvastatin (LIPITOR) 40 MG  tablet Take 40 mg by mouth daily.    04/27/2019 at 0800  . calcium carbonate (TUMS - DOSED IN MG ELEMENTAL CALCIUM) 500 MG chewable tablet Chew 1 tablet by mouth as needed for indigestion or heartburn.   Unknown at PRN  . carboxymethylcellulose (REFRESH PLUS) 0.5 % SOLN Place 1-2 drops into both eyes 3 (three) times daily as needed (for dry eyes).   Unknown at PRN  . citalopram (CELEXA) 40 MG tablet Take 40 mg by mouth daily.    04/27/2019 at 0800  . clopidogrel (PLAVIX) 75 MG tablet Take 75 mg by mouth daily.    04/27/2019 at 0800  . diclofenac sodium (VOLTAREN) 1 % GEL Apply 1 g topically 2 (two) times daily as needed (for pain.).   Unknown at PRN  . docusate sodium (STOOL SOFTENER) 100 MG capsule Take 200 mg by mouth 2 (two) times daily.    04/27/2019 at 0800  . fluticasone (FLONASE) 50 MCG/ACT nasal spray Place 2 sprays into both nostrils daily.    04/27/2019 at 0800  . hydroxypropyl methylcellulose / hypromellose (ISOPTO TEARS / GONIOVISC) 2.5 % ophthalmic solution Place 1-2 drops into both eyes 4 (  four) times daily as needed for dry eyes.   Unknown at PRN  . lidocaine-prilocaine (EMLA) cream Apply 1 application topically as needed (prior to dialysis).   Unknown at PRN  . loratadine (CLARITIN) 10 MG tablet Take 10 mg by mouth at bedtime.   04/27/2019 at 2000  . nystatin (MYCOSTATIN/NYSTOP) powder Apply 1 g topically daily as needed (for skin rashes.).    Unknown at PRN  . polyethylene glycol powder (GLYCOLAX/MIRALAX) powder Take 17 g by mouth 3 (three) times daily as needed for moderate constipation.    Unknown at PRN  . RENVELA 800 MG tablet Take 1,600 mg by mouth 3 (three) times daily with meals.    04/27/2019 at 1800  . VICTOZA 18 MG/3ML SOPN Inject 1.2 mg into the skin daily.   12 04/27/2019 at Unknown time    Current medications: Current Facility-Administered Medications  Medication Dose Route Frequency Provider Last Rate Last Dose  . albuterol (PROVENTIL) (2.5 MG/3ML) 0.083% nebulizer solution  2.5 mg  2.5 mg Nebulization Q6H PRN Max Sane, MD      . allopurinol (ZYLOPRIM) tablet 150 mg  150 mg Oral Once per day on Tue Thu Sat Lang Snow, NP      . amitriptyline (ELAVIL) tablet 25 mg  25 mg Oral QHS Lang Snow, NP      . amLODipine (NORVASC) tablet 10 mg  10 mg Oral Daily Lang Snow, NP      . Derrill Memo ON 04/29/2019] atorvastatin (LIPITOR) tablet 40 mg  40 mg Oral Daily Ouma, Bing Neighbors, NP      . calcium carbonate (TUMS - dosed in mg elemental calcium) chewable tablet 200 mg of elemental calcium  1 tablet Oral PRN Lang Snow, NP      . calcium chloride 10 % injection           . [START ON 04/29/2019] ceFEPIme (MAXIPIME) 1 g in sodium chloride 0.9 % 100 mL IVPB  1 g Intravenous Q24H Vira Blanco, RPH      . citalopram (CELEXA) tablet 40 mg  40 mg Oral Daily Ouma, Bing Neighbors, NP      . clopidogrel (PLAVIX) tablet 75 mg  75 mg Oral Daily Ouma, Bing Neighbors, NP      . docusate sodium (COLACE) capsule 200 mg  200 mg Oral BID Lang Snow, NP      . fluticasone (FLONASE) 50 MCG/ACT nasal spray 2 spray  2 spray Each Nare Daily Ouma, Bing Neighbors, NP      . heparin injection 5,000 Units  5,000 Units Subcutaneous Q8H Ouma, Bing Neighbors, NP      . insulin aspart (novoLOG) 100 UNIT/ML injection           . lidocaine-prilocaine (EMLA) cream 1 application  1 application Topical PRN Lang Snow, NP      . liraglutide (VICTOZA) SOPN 1.2 mg  1.2 mg Subcutaneous Daily Ouma, Bing Neighbors, NP      . loratadine (CLARITIN) tablet 10 mg  10 mg Oral QHS Ouma, Bing Neighbors, NP      . nystatin (MYCOSTATIN/NYSTOP) topical powder 1 g  1 g Topical Daily PRN Lang Snow, NP      . ondansetron [facility]) 4 MG/2ML injection           . polyethylene glycol powder (GLYCOLAX/MIRALAX) container 17 g  17 g Oral TID PRN Lang Snow, NP      . polyvinyl alcohol (LIQUIFILM TEARS) 1.4 %  ophthalmic solution 1-2 drop  1-2 drop Both Eyes QID PRN Lang Snow, NP      . sevelamer carbonate (RENVELA) tablet 1,600 mg  1,600 mg Oral TID WC Ouma, Bing Neighbors, NP      . sodium bicarbonate 1 mEq/mL injection           . vancomycin (VANCOCIN) IVPB 1000 mg/200 mL premix  1,000 mg Intravenous Once Vira Blanco, [facility]          Allergies: Allergies  Allergen Reactions  . Iodine Rash  . Enalapril Other (See Comments)    TONGUE SWELLS/GOUT  . Iodinated Diagnostic Agents Other (See Comments)    BREAK OUT IN WHELPS        Past Medical History: Past Medical History:  Diagnosis Date  . Anemia   . Anxiety   . Arthritis   . Cancer [facility])    Left Kidney Cancer  . CHF (congestive heart failure) ([place])   . Chronic kidney disease   . COPD (chronic obstructive pulmonary disease) ([facility])    Use Oxygen at bedtime  . Coronary artery disease   . Diabetes mellitus without complication ([place])   . Dialysis patient ([place])    Tues, Thurs, Sat  . Dyspnea    with exertion  . GERD (gastroesophageal reflux disease)   . Gout   . History of kidney stones   . Hypertension   . Neuropathy   . Peripheral vascular disease ([place])   . Sleep apnea    OSA--USE C-PAP  . Stroke [facility]) 2004     Past Surgical History: Past Surgical History:  Procedure Laterality Date  . A/V FISTULAGRAM Left 04/26/2017   Procedure: A/V Fistulagram;  Surgeon: Katha Cabal, MD;  Location: [place] CV LAB;  Service: Cardiovascular;  Laterality: Left;  . ABDOMINAL HYSTERECTOMY    . CARDIAC CATHETERIZATION    . DIALYSIS/PERMA CATHETER INSERTION N/A 01/31/2017   Procedure: Dialysis/Perma Catheter Insertion;  Surgeon: Algernon Huxley, MD;  Location: [place] [facility];  Service: Cardiovascular;  Laterality: N/A;  . DIALYSIS/PERMA CATHETER INSERTION N/A 02/21/2017   Procedure: Dialysis/Perma Catheter Insertion;  Surgeon: Algernon Huxley, MD;  Location: [place] CV LAB;  Service: Cardiovascular;   Laterality: N/A;  . DIALYSIS/PERMA CATHETER INSERTION N/A 03/19/2017   Procedure: Dialysis/Perma Catheter Insertion;  Surgeon: Katha Cabal, MD;  Location: [place] CV LAB;  Service: Cardiovascular;  Laterality: N/A;  . DIALYSIS/PERMA CATHETER REMOVAL N/A 07/19/2017   Procedure: DIALYSIS/PERMA CATHETER REMOVAL;  Surgeon: Katha Cabal, MD;  Location: [place] CV LAB;  Service: Cardiovascular;  Laterality: N/A;  . EYE SURGERY Bilateral    Cataract Extraction with IOL  . KIDNEY SURGERY Left    Partial Nephrectomy  . LEFT HEART CATH AND CORONARY ANGIOGRAPHY N/A 04/05/2017   Procedure: Left Heart Cath and Coronary Angiography;  Surgeon: Wellington Hampshire, MD;  Location: [place] CV LAB;  Service: Cardiovascular;  Laterality: N/A;  . PERIPHERAL VASCULAR CATHETERIZATION N/A 01/23/2016   Procedure: Dialysis/Perma Catheter Insertion;  Surgeon: Algernon Huxley, MD;  Location: [place] CV LAB;  Service: Cardiovascular;  Laterality: N/A;  . PERIPHERAL VASCULAR CATHETERIZATION Left 10/01/2016   Procedure: A/V Shuntogram/Fistulagram;  Surgeon: Algernon Huxley, MD;  Location: [place] CV LAB;  Service: Cardiovascular;  Laterality: Left;  . PERIPHERAL VASCULAR THROMBECTOMY Left 11/24/2018   Procedure: PERIPHERAL VASCULAR THROMBECTOMY;  Surgeon: Algernon Huxley, MD;  Location: [place] [facility];  Service: Cardiovascular;  Laterality: Left;  . UPPER  EXTREMITY VENOGRAPHY Bilateral 04/26/2017   Procedure: Upper Extremity Venography;  Surgeon: Katha Cabal, MD;  Location: [place] CV LAB;  Service: Cardiovascular;  Laterality: Bilateral;  . VASCULAR ACCESS DEVICE INSERTION Left 05/22/2017   Procedure: INSERTION OF HERO VASCULAR ACCESS DEVICE;  Surgeon: Katha Cabal, MD;  Location: [facility] ORS;  Service: Vascular;  Laterality: Left;     Family History: Family History  Problem Relation Age of Onset  . Hypertension Mother   . Heart failure Mother   . Diabetes Father   . Heart  attack Father   . Lung cancer Maternal Aunt   . Cancer Maternal Aunt      Social History: Social History   Socioeconomic History  . Marital status: Married    Spouse name: Not on file  . Number of children: Not on file  . Years of education: Not on file  . Highest education level: Not on file  Occupational History  . Not on file  Social Needs  . Financial resource strain: Not on file  . Food insecurity:    Worry: Not on file    Inability: Not on file  . Transportation needs:    Medical: Not on file    Non-medical: Not on file  Tobacco Use  . Smoking status: Former Research scientist (life sciences)  . Smokeless tobacco: Never Used  Substance and Sexual Activity  . Alcohol use: No  . Drug use: No  . Sexual activity: Never  Lifestyle  . Physical activity:    Days per week: Not on file    Minutes per session: Not on file  . Stress: Not on file  Relationships  . Social connections:    Talks on phone: Not on file    Gets together: Not on file    Attends religious service: Not on file    Active member of club or organization: Not on file    Attends meetings of clubs or organizations: Not on file    Relationship status: Not on file  . Intimate partner violence:    Fear of current or ex partner: Not on file    Emotionally abused: Not on file    Physically abused: Not on file    Forced sexual activity: Not on file  Other Topics Concern  . Not on file  Social History Narrative  . Not on file     Review of Systems: Review of Systems  Unable to perform ROS: Critical illness    Vital Signs: Blood pressure (!) 66/51, pulse 80, temperature 98.2 F (36.8 C), temperature source Oral, resp. rate (!) 29, height 5\' 6"  (1.676 m), weight 125.2 kg, SpO2 100 %.  Weight trends: Filed Weights   04/06/2019 0719  Weight: 125.2 kg    Physical Exam: General: Critically ill  Head: Normocephalic, atraumatic. Moist oral mucosal membranes  Eyes: Anicteric, PERRL  Neck: Supple, trachea midline  Lungs:   Vent assisted  Heart: Regular rate and rhythm  Abdomen:  obese  Extremities:  ++ peripheral edema.  Neurologic: Nonfocal, moving all four extremities  Skin: No lesions  Access: Clotted left AVG, left femoral temp catheter 5/26     Lab results: Basic Metabolic Panel: Recent Labs  Lab 04/27/2019 0731  NA 132*  K 7.2*  CL 94*  CO2 21*  GLUCOSE 208*  BUN 94*  CREATININE 14.81*  CALCIUM 8.3*    Liver Function Tests: Recent Labs  Lab 04/11/2019 0731  AST 13*  ALT 16  ALKPHOS 206*  BILITOT 0.9  PROT 7.7  ALBUMIN 3.6   Recent Labs  Lab 04/14/2019 0731  LIPASE 59*   No results for input(s): AMMONIA in the last 168 hours.  CBC: Recent Labs  Lab 04/25/2019 0731  WBC 8.2  NEUTROABS 6.0  HGB 12.1  HCT 37.2  MCV 90.1  PLT 229    Cardiac Enzymes: No results for input(s): CKTOTAL, CKMB, CKMBINDEX, TROPONINI in the last 168 hours.  BNP: Invalid input(s): POCBNP  CBG: Recent Labs  Lab 04/17/2019 0849 04/04/2019 1030  GLUCAP 170* 190*    Microbiology: Results for orders placed or performed during the hospital encounter of 04/05/2019  SARS Coronavirus 2 (CEPHEID - Performed in [place] hospital lab), Hosp Order     Status: None   Collection Time: 04/22/2019  7:31 AM  Result Value Ref Range Status   SARS Coronavirus 2 NEGATIVE NEGATIVE Final    Comment: (NOTE) If result is NEGATIVE SARS-CoV-2 target nucleic acids are NOT DETECTED. The SARS-CoV-2 RNA is generally detectable in upper and lower  respiratory specimens during the acute phase of infection. The lowest  concentration of SARS-CoV-2 viral copies this assay can detect is 250  copies / mL. A negative result does not preclude SARS-CoV-2 infection  and should not be used as the sole basis for treatment or other  patient management decisions.  A negative result may occur with  improper specimen collection / handling, submission of specimen other  than nasopharyngeal swab, presence of viral mutation(s) within the   areas targeted by this assay, and inadequate number of viral copies  (<250 copies / mL). A negative result must be combined with clinical  observations, patient history, and epidemiological information. If result is POSITIVE SARS-CoV-2 target nucleic acids are DETECTED. The SARS-CoV-2 RNA is generally detectable in upper and lower  respiratory specimens dur ing the acute phase of infection.  Positive  results are indicative of active infection with SARS-CoV-2.  Clinical  correlation with patient history and other diagnostic information is  necessary to determine patient infection status.  Positive results do  not rule out bacterial infection or co-infection with other viruses. If result is PRESUMPTIVE POSTIVE SARS-CoV-2 nucleic acids MAY BE PRESENT.   A presumptive positive result was obtained on the submitted specimen  and confirmed on repeat testing.  While 2019 novel coronavirus  (SARS-CoV-2) nucleic acids may be present in the submitted sample  additional confirmatory testing may be necessary for epidemiological  and / or clinical management purposes  to differentiate between  SARS-CoV-2 and other Sarbecovirus currently known to infect humans.  If clinically indicated additional testing with an alternate test  methodology [phone]) is advised. The SARS-CoV-2 RNA is generally  detectable in upper and lower respiratory sp ecimens during the acute  phase of infection. The expected result is Negative. Fact Sheet for Patients:  StrictlyIdeas.no Fact Sheet for Healthcare Providers: BankingDealers.co.za This test is not yet approved or cleared by the [place] FDA and has been authorized for detection and/or diagnosis of SARS-CoV-2 by FDA under an Emergency Use Authorization (EUA).  This EUA will remain in effect (meaning this test can be used) for the duration of the COVID-19 declaration under Section 564(b)(1) of the Act, 21  U.S.C. section 360bbb-3(b)(1), unless the authorization is terminated or revoked sooner. Performed at [facility], [address], [place], [place] [address]   MRSA PCR Screening     Status: None   Collection Time: 04/04/2019  9:04 AM  Result Value Ref Range Status  MRSA by PCR NEGATIVE NEGATIVE Final    Comment:        The GeneXpert MRSA Assay (FDA approved for NASAL specimens only), is one component of a comprehensive MRSA colonization surveillance program. It is not intended to diagnose MRSA infection nor to guide or monitor treatment for MRSA infections. Performed at [facility], [address] [place], [place] [address]     Coagulation Studies: Recent Labs    04/25/2019 0731  LABPROT 14.5  INR 1.1    Urinalysis: No results for input(s): COLORURINE, LABSPEC, PHURINE, GLUCOSEU, HGBUR, BILIRUBINUR, KETONESUR, PROTEINUR, UROBILINOGEN, NITRITE, LEUKOCYTESUR in the last 72 hours.  Invalid input(s): APPERANCEUR    Imaging: Dg Chest Portable 1 View  Result Date: 04/16/2019 CLINICAL DATA:  Short of breath EXAM: PORTABLE CHEST 1 VIEW COMPARISON:  03/28/2015 FINDINGS: New left upper extremity and jugular hair 0 catheter with its tip in the upper SVC. Cardiomegaly. Normal vascularity. Bibasilar opacities likely combination of volume loss and pleural fluid. No pneumothorax. Triangular opacity at the medial right lung base is noted representing either consolidation in the right lower lobe or collapse. IMPRESSION: Right lower lobe collapse versus consolidation. Bibasilar opacities likely combination of pleural fluid and atelectasis. Electronically Signed   By: Marybelle Killings M.D.   On: 04/16/2019 08:04      Assessment & Plan: Ms. NAJAI WASZAK is a 63 y.o. black female with end stage renal disease on hemodialysis, hypertension, CVA, obstructive sleep apnea, peripheral vascular disease, hypertension, gout, GERD, diabetes mellitus type II, COPD, coronary artery  disease, congestive heart failure, who  was admitted to [facility] on 04/23/2019 for Hyperkalemia [E87.5] ESRD on hemodialysis ([place]) [N18.6, Z99.2] HCAP (healthcare-associated pneumonia) [J18.9] Clotted dialysis access, initial encounter ([place]) [T82.49XA] Acute on chronic kidney failure ([place]) [N17.9, N18.9]  Patient underwent cardiopulmonary arrest and now intubated and transferred to ICU. Emergent left temp femoral line placed.   TTS [facility]rology [facility] left AVG   1. End stage renal disease with hyperkalemia Complication of dialysis device.  - temp HD catheter placed by ICU - emergent renal replacement therapy: Continuous renal replacement therapy: CVVHD 0K bath. Orders prepared.  - Appreciate vascular input. Thrombectomy once patient is stable. Discussed the case with Dr. Lucky Cowboy.  - Get outpatient records from [facility]  2. Anemia of chronic kidney disease: holding epo. Hemoglobin 12.1  3. Hypotension: will be requiring vasopressors as per ICU. No ultrafiltration with dialysis.    LOS: 0 Sarath Kolluru 5/26/202012:17 PM

## 2019-04-28 NOTE — Code Documentation (Addendum)
Dr Joni Fears using [place] to look for cardiac activity. Cardiac activity seen by MD on [place] but no palpable pulses. bp 47/17 on monitor. CPR resumed. Dr Joni Fears remains in room.

## 2019-04-29 ENCOUNTER — Inpatient Hospital Stay: Payer: Medicare Other

## 2019-04-29 LAB — RENAL FUNCTION PANEL
Albumin: 3 g/dL — ABNORMAL LOW (ref 3.5–5.0)
Albumin: 3 g/dL — ABNORMAL LOW (ref 3.5–5.0)
Albumin: 2.8 g/dL — ABNORMAL LOW (ref 3.5–5.0)
Albumin: 2.9 g/dL — ABNORMAL LOW (ref 3.5–5.0)
Albumin: 2.7 g/dL — ABNORMAL LOW (ref 3.5–5.0)
Anion gap: 13 (ref 5–15)
Anion gap: 9 (ref 5–15)
Anion gap: 14 (ref 5–15)
Anion gap: 15 (ref 5–15)
Anion gap: 11 (ref 5–15)
BUN: 62 mg/dL — ABNORMAL HIGH (ref 8–23)
BUN: 55 mg/dL — ABNORMAL HIGH (ref 8–23)
BUN: 50 mg/dL — ABNORMAL HIGH (ref 8–23)
BUN: 47 mg/dL — ABNORMAL HIGH (ref 8–23)
BUN: 44 mg/dL — ABNORMAL HIGH (ref 8–23)
CO2: 23 mmol/L (ref 22–32)
CO2: 25 mmol/L (ref 22–32)
CO2: 22 mmol/L (ref 22–32)
CO2: 23 mmol/L (ref 22–32)
CO2: 24 mmol/L (ref 22–32)
Calcium: 8 mg/dL — ABNORMAL LOW (ref 8.9–10.3)
Calcium: 8 mg/dL — ABNORMAL LOW (ref 8.9–10.3)
Calcium: 8.1 mg/dL — ABNORMAL LOW (ref 8.9–10.3)
Calcium: 8 mg/dL — ABNORMAL LOW (ref 8.9–10.3)
Calcium: 8 mg/dL — ABNORMAL LOW (ref 8.9–10.3)
Chloride: 99 mmol/L (ref 98–111)
Chloride: 100 mmol/L (ref 98–111)
Chloride: 98 mmol/L (ref 98–111)
Chloride: 98 mmol/L (ref 98–111)
Chloride: 101 mmol/L (ref 98–111)
Creatinine, Ser: 8.26 mg/dL — ABNORMAL HIGH (ref 0.44–1.00)
Creatinine, Ser: 7.47 mg/dL — ABNORMAL HIGH (ref 0.44–1.00)
Creatinine, Ser: 6.69 mg/dL — ABNORMAL HIGH (ref 0.44–1.00)
Creatinine, Ser: 6.84 mg/dL — ABNORMAL HIGH (ref 0.44–1.00)
Creatinine, Ser: 5.2 mg/dL — ABNORMAL HIGH (ref 0.44–1.00)
GFR calc Af Amer: 5 mL/min — ABNORMAL LOW (ref >60)
GFR calc Af Amer: 6 mL/min — ABNORMAL LOW (ref >60)
GFR calc Af Amer: 7 mL/min — ABNORMAL LOW (ref >60)
GFR calc Af Amer: 7 mL/min — ABNORMAL LOW (ref >60)
GFR calc Af Amer: 9 mL/min — ABNORMAL LOW (ref >60)
GFR calc non Af Amer: 5 mL/min — ABNORMAL LOW (ref >60)
GFR calc non Af Amer: 5 mL/min — ABNORMAL LOW (ref >60)
GFR calc non Af Amer: 6 mL/min — ABNORMAL LOW (ref >60)
GFR calc non Af Amer: 6 mL/min — ABNORMAL LOW (ref >60)
GFR calc non Af Amer: 8 mL/min — ABNORMAL LOW (ref >60)
Glucose, Bld: 194 mg/dL — ABNORMAL HIGH (ref 70–99)
Glucose, Bld: 168 mg/dL — ABNORMAL HIGH (ref 70–99)
Glucose, Bld: 151 mg/dL — ABNORMAL HIGH (ref 70–99)
Glucose, Bld: 125 mg/dL — ABNORMAL HIGH (ref 70–99)
Glucose, Bld: 183 mg/dL — ABNORMAL HIGH (ref 70–99)
Phosphorus: 4.8 mg/dL — ABNORMAL HIGH (ref 2.5–4.6)
Phosphorus: 4.7 mg/dL — ABNORMAL HIGH (ref 2.5–4.6)
Phosphorus: 4.9 mg/dL — ABNORMAL HIGH (ref 2.5–4.6)
Phosphorus: 4.8 mg/dL — ABNORMAL HIGH (ref 2.5–4.6)
Phosphorus: 4.3 mg/dL (ref 2.5–4.6)
Potassium: 4.9 mmol/L (ref 3.5–5.1)
Potassium: 4.7 mmol/L (ref 3.5–5.1)
Potassium: 4.7 mmol/L (ref 3.5–5.1)
Potassium: 5.1 mmol/L (ref 3.5–5.1)
Potassium: 4.6 mmol/L (ref 3.5–5.1)
Sodium: 135 mmol/L (ref 135–145)
Sodium: 134 mmol/L — ABNORMAL LOW (ref 135–145)
Sodium: 134 mmol/L — ABNORMAL LOW (ref 135–145)
Sodium: 136 mmol/L (ref 135–145)
Sodium: 136 mmol/L (ref 135–145)

## 2019-04-29 LAB — CBC
HCT: 41.5 % (ref 36.0–46.0)
Hemoglobin: 13.2 g/dL (ref 12.0–15.0)
MCH: 28.8 pg (ref 26.0–34.0)
MCHC: 31.8 g/dL (ref 30.0–36.0)
MCV: 90.6 fL (ref 80.0–100.0)
Platelets: 220 10*3/uL (ref 150–400)
RBC: 4.58 MIL/uL (ref 3.87–5.11)
RDW: 16.3 % — ABNORMAL HIGH (ref 11.5–15.5)
WBC: 25.5 10*3/uL — ABNORMAL HIGH (ref 4.0–10.5)
nRBC: 0 % (ref 0.0–0.2)

## 2019-04-29 LAB — BLOOD GAS, ARTERIAL
Acid-base deficit: 4.6 mmol/L — ABNORMAL HIGH (ref 0.0–2.0)
Bicarbonate: 22.1 mmol/L (ref 20.0–28.0)
FIO2: 0.5
MECHVT: 430 mL
O2 Saturation: 95.1 %
PEEP: 5 cmH2O
Patient temperature: 37
RATE: 15 resp/min
pCO2 arterial: 46 mmHg (ref 32.0–48.0)
pH, Arterial: 7.29 — ABNORMAL LOW (ref 7.350–7.450)
pO2, Arterial: 85 mmHg (ref 83.0–108.0)

## 2019-04-29 LAB — MAGNESIUM
Magnesium: 1.9 mg/dL (ref 1.7–2.4)
Magnesium: 1.9 mg/dL (ref 1.7–2.4)
Magnesium: 1.8 mg/dL (ref 1.7–2.4)
Magnesium: 2.1 mg/dL (ref 1.7–2.4)
Magnesium: 1.9 mg/dL (ref 1.7–2.4)
Magnesium: 1.8 mg/dL (ref 1.7–2.4)

## 2019-04-29 LAB — GLUCOSE, CAPILLARY
Glucose-Capillary: 105 mg/dL — ABNORMAL HIGH (ref 70–99)
Glucose-Capillary: 106 mg/dL — ABNORMAL HIGH (ref 70–99)
Glucose-Capillary: 134 mg/dL — ABNORMAL HIGH (ref 70–99)
Glucose-Capillary: 137 mg/dL — ABNORMAL HIGH (ref 70–99)
Glucose-Capillary: 139 mg/dL — ABNORMAL HIGH (ref 70–99)
Glucose-Capillary: 166 mg/dL — ABNORMAL HIGH (ref 70–99)
Glucose-Capillary: 172 mg/dL — ABNORMAL HIGH (ref 70–99)

## 2019-04-29 LAB — HIV ANTIBODY (ROUTINE TESTING W REFLEX): HIV Screen 4th Generation wRfx: NONREACTIVE

## 2019-04-29 LAB — TROPONIN I: Troponin I: 0.66 ng/mL (ref <0.03)

## 2019-04-29 LAB — APTT: aPTT: 36 seconds (ref 24–36)

## 2019-04-29 LAB — HEPARIN LEVEL (UNFRACTIONATED)
Heparin Unfractionated: 0.54 IU/mL (ref 0.30–0.70)
Heparin Unfractionated: <0.1 IU/mL — ABNORMAL LOW (ref 0.30–0.70)

## 2019-04-29 MED ORDER — DOCUSATE SODIUM 50 MG/5ML PO LIQD
200.0000 mg | Freq: Two times a day (BID) | ORAL | Status: DC
  Administered 2019-04-29 – 2019-05-06 (×15): 200 mg
  Filled 2019-04-29 (×15): qty 20

## 2019-04-29 MED ORDER — FENTANYL CITRATE (PF) 100 MCG/2ML IJ SOLN
50.0000 ug | INTRAMUSCULAR | Status: DC | PRN
  Administered 2019-05-08: 50 ug via INTRAVENOUS
  Filled 2019-04-29: qty 2

## 2019-04-29 MED ORDER — VITAL HIGH PROTEIN PO LIQD
1000.0000 mL | ORAL | Status: DC
  Administered 2019-04-29 – 2019-05-04 (×6): 1000 mL

## 2019-04-29 MED ORDER — HEPARIN BOLUS VIA INFUSION
2700.0000 [IU] | Freq: Once | INTRAVENOUS | Status: AC
  Administered 2019-04-29: 2700 [IU] via INTRAVENOUS
  Filled 2019-04-29: qty 2700

## 2019-04-29 MED ORDER — LORATADINE 10 MG PO TABS
10.0000 mg | ORAL_TABLET | Freq: Every day | ORAL | Status: DC
  Administered 2019-04-29 – 2019-05-05 (×7): 10 mg
  Filled 2019-04-29 (×7): qty 1

## 2019-04-29 MED ORDER — FENTANYL CITRATE (PF) 100 MCG/2ML IJ SOLN
50.0000 ug | INTRAMUSCULAR | Status: DC | PRN
  Administered 2019-04-29 – 2019-04-30 (×2): 100 ug via INTRAVENOUS
  Administered 2019-05-02: 200 ug via INTRAVENOUS
  Administered 2019-05-02 – 2019-05-05 (×2): 100 ug via INTRAVENOUS
  Administered 2019-05-05: 200 ug via INTRAVENOUS
  Filled 2019-04-29 (×2): qty 2
  Filled 2019-04-29 (×2): qty 4

## 2019-04-29 MED ORDER — CHLORHEXIDINE GLUCONATE CLOTH 2 % EX PADS
6.0000 | MEDICATED_PAD | Freq: Every day | CUTANEOUS | Status: DC
  Administered 2019-04-29 – 2019-05-19 (×19): 6 via TOPICAL

## 2019-04-29 MED ORDER — FENTANYL CITRATE (PF) 100 MCG/2ML IJ SOLN
INTRAMUSCULAR | Status: AC
  Administered 2019-04-29: 100 ug via INTRAVENOUS
  Filled 2019-04-29: qty 2

## 2019-04-29 MED ORDER — ADULT MULTIVITAMIN LIQUID CH
15.0000 mL | Freq: Every day | ORAL | Status: DC
  Administered 2019-04-29 – 2019-05-05 (×7): 15 mL
  Filled 2019-04-29 (×7): qty 15

## 2019-04-29 MED ORDER — MIDAZOLAM HCL 2 MG/2ML IJ SOLN
2.0000 mg | Freq: Once | INTRAMUSCULAR | Status: AC
  Administered 2019-04-29: 2 mg via INTRAVENOUS

## 2019-04-29 MED ORDER — PRO-STAT SUGAR FREE PO LIQD
60.0000 mL | Freq: Four times a day (QID) | ORAL | Status: DC
  Administered 2019-04-29 – 2019-05-05 (×24): 60 mL

## 2019-04-29 MED ORDER — MIDAZOLAM HCL 2 MG/2ML IJ SOLN
INTRAMUSCULAR | Status: AC
  Administered 2019-04-29: 2 mg via INTRAVENOUS
  Filled 2019-04-29: qty 2

## 2019-04-29 MED ORDER — PUREFLOW DIALYSIS SOLUTION
INTRAVENOUS | Status: DC
  Administered 2019-04-29: 10:00:00 via INTRAVENOUS_CENTRAL
  Administered 2019-04-29: 3 via INTRAVENOUS_CENTRAL
  Administered 2019-04-30 – 2019-05-01 (×4): via INTRAVENOUS_CENTRAL

## 2019-04-29 MED ORDER — SEVELAMER CARBONATE 800 MG PO TABS
1600.0000 mg | ORAL_TABLET | Freq: Three times a day (TID) | ORAL | Status: DC
  Administered 2019-04-29 – 2019-04-30 (×3): 1600 mg
  Filled 2019-04-29 (×2): qty 2

## 2019-04-29 NOTE — Progress Notes (Signed)
Pharmacy Antibiotic Note  [name] is a 63 y.o. female admitted on 04/30/2019 with pneumonia.  Pharmacy has been consulted for Vancomycin and Cefepime dosing. Patient is ESRD on HD on Tue/Thur/Sat schedule. Patient is now mechanically ventilated and has been initiated on CRRT.  Plan: Vancomycin discontinued  Continue Cefepime 2g IV q12h.  If changes to HD will need to adjust dosing.  Height: 5' 5.98" (167.6 cm) Weight: 291 lb 10.7 oz (132.3 kg) IBW/kg (Calculated) : 59.26  Temp (24hrs), Avg:97.8 F (36.6 C), Min:96.8 F (36 C), Max:98.2 F (36.8 C)  Recent Labs  Lab 04/22/2019 0731 04/19/2019 1451  04/26/2019 2139 04/29/19 0143 04/29/19 0423 04/29/19 0837 04/29/19 1338  WBC 8.2 17.7*  --   --   --  25.5*  --   --   CREATININE 14.81* 13.69*   < > 9.81* 8.26* 7.47* 6.69* 6.84*   < > = values in this interval not displayed.    Estimated Creatinine Clearance: 11.6 mL/min (A) (by C-G formula based on SCr of 6.84 mg/dL (H)).    Allergies  Allergen Reactions  . Iodine Rash  . Enalapril Other (See Comments)    TONGUE SWELLS/GOUT  . Iodinated Diagnostic Agents Other (See Comments)    BREAK OUT IN WHELPS      Antimicrobials this admission: Vancomycin  5/26 >> 5/27 Cefepime 5/26 >>   Thank you for allowing pharmacy to be a part of this patient's care.  Paulina Fusi, PharmD, BCPS 04/29/2019 4:15 PM

## 2019-04-29 NOTE — Progress Notes (Signed)
[facility] [place] NAME: Amandy Chubbuck    MR#:  [id number]  DATE OF BIRTH:  11-Mar-1956  SUBJECTIVE:  CHIEF COMPLAINT:   Chief Complaint  Patient presents with  . Shortness of Breath  Remains on vent, on high dose Levophed.  REVIEW OF SYSTEMS:  ROS unable to obtain as on vent, sedated  DRUG ALLERGIES:   Allergies  Allergen Reactions  . Iodine Rash  . Enalapril Other (See Comments)    TONGUE SWELLS/GOUT  . Iodinated Diagnostic Agents Other (See Comments)    BREAK OUT IN WHELPS     VITALS:  Blood pressure (!) 88/56, pulse 84, temperature 98.1 F (36.7 C), resp. rate (!) 31, height 5' 5.98" (1.676 m), weight 132.3 kg, SpO2 92 %. PHYSICAL EXAMINATION:  Physical Exam  GENERAL:  Morbidly obese, chronically ill-appearing HEAD: Normocephalic, atraumatic.  EYES: Pupils equal, round, reactive to light.  No scleral icterus.  MOUTH: Moist mucosal membrane.  Endotracheal tube in place NECK: Supple. No thyromegaly. No nodules. No JVD.  PULMONARY: Mild bibasilar crackles CARDIOVASCULAR: S1 and S2. Regular rate and rhythm. No murmurs, rubs, or gallops.  GASTROINTESTINAL: Soft, nontender, non-distended. No masses. Positive bowel sounds. No hepatosplenomegaly.  MUSCULOSKELETAL: No swelling, clubbing, or edema.  NEUROLOGIC: Mild distress due to acute illness SKIN:intact,warm,dry LABORATORY PANEL:  Female CBC Recent Labs  Lab 04/29/19 0423  WBC 25.5*  HGB 13.2  HCT 41.5  PLT 220   ------------------------------------------------------------------------------------------------------------------ Chemistries  Recent Labs  Lab 04/04/2019 1451  04/29/19 1338  NA 137   < > 136  K 4.7   < > 5.1  CL 99   < > 98  CO2 19*   < > 23  GLUCOSE 248*  246*   < > 125*  BUN 90*   < > 47*  CREATININE 13.69*   < > 6.84*  CALCIUM 9.0   < > 8.0*  MG 2.2   < > 2.1  AST 32  --   --   ALT 41  --   --   ALKPHOS 218*  --   --   BILITOT 0.9  --   --     < > = values in this interval not displayed.   RADIOLOGY:  Dg Chest Port 1 View  Result Date: 04/29/2019 CLINICAL DATA:  Acute respiratory failure EXAM: PORTABLE CHEST 1 VIEW COMPARISON:  Yesterday FINDINGS: Endotracheal tube tip between the clavicular heads and carina. Left subclavian dialysis catheter with tip at the brachiocephalic SVC confluence. There is an orogastric tube which at least reaches the stomach. Haziness of the bilateral lower chest with obscuration of the left diaphragm. No Kerley lines, effusion, or pneumothorax. Cardiomegaly.  Rightward rotation. IMPRESSION: 1. Stable hardware positioning. 2. Worsening lower lobe aeration. Electronically Signed   By: Monte Fantasia M.D.   On: 04/29/2019 06:50   ASSESSMENT AND PLAN:  63 y.o. 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  1. Acute respiratory failure post PEA cardiopulmonary arrest   - continue Full MV support and vent management per ICU team  2. ESRD on HD now with metabolic crisis due missed dialysis complicated by device failure -  temp HD catheter placed by ICU team on 5/26  -  Dialysis per nephro team -currently getting CRRT.  Vascular to place permacath once patient is stable  3. Anemia of chronic kidney disease:  - Epogen per nephrology   4. Septic shock:  Requiring Levophed - weaning off vsopressors. Leukocytosis increasing.  - continue Empiric antibiotics for healthcare associated pneumonia  5. Hyperkalemia -calcium gluconate given in the ED -now resolved - Emergent renal replacement therapy  yesterday - Continue management and monitoring per ICU team  6. HCAP - X-ray shows right lower lobe collapse versus  consolidation with bibasilar opacity  - Blood cultures pending - MRSA screening negative - SARS Coronavirus 2 - Continue cefepime  7. OSA - on CPAP at home - Currently intubated  8. COPD - No evidence of exacerbation on presentation - Ventilator support as above        All the records are reviewed and case discussed with Care Management/Social Worker. Management plans discussed with the patient, intensivist and they are in agreement.  CODE STATUS: Full Code  TOTAL TIME TAKING CARE OF THIS PATIENT: 15 minutes.   More than 50% of the time was spent in counseling/coordination of care: YES  POSSIBLE D/C IN 4-5 DAYS, DEPENDING ON CLINICAL CONDITION.   Max Sane M.D on 04/29/2019 at 4:44 PM  Between 7am to 6pm - Pager - [phone]  After 6pm go to [email or web address] - Proofreader  [facility]  Office  [phone]  CC: Primary care physician; Smithson, [name], MD  Note: This dictation was prepared with Dragon dictation along with smaller phrase technology. Any transcriptional errors that result from this process are unintentional.

## 2019-04-29 NOTE — Progress Notes (Signed)
Request sent to Dr Lanney Gins to consider adding GI prophylaxis to this patient's profile.

## 2019-04-29 NOTE — Progress Notes (Signed)
[facility] Daily Progress Note   Patient still intubated. Currently on CRRT. Will place permcath when patient is stable.  Will continue to follow.   Discussed with Dr. Ellis Parents Stegmayer PA-C 04/29/2019 12:27 PM

## 2019-04-29 NOTE — Progress Notes (Signed)
ANTICOAGULATION CONSULT NOTE - Initial Consult  Pharmacy Consult for Heparin Indication: chest pain/ACS  Allergies  Allergen Reactions  . Iodine Rash  . Enalapril Other (See Comments)    TONGUE SWELLS/GOUT  . Iodinated Diagnostic Agents Other (See Comments)    BREAK OUT IN WHELPS      Patient Measurements: Height: 5' 5.98" (167.6 cm) Weight: 291 lb 10.7 oz (132.3 kg) IBW/kg (Calculated) : 59.26 Heparin Dosing Weight: 92.1 kg  Vital Signs: Temp: 96.8 F (36 C) (05/26 2000) Temp Source: Rectal (05/27 0600) BP: 117/55 (05/27 0500)  Labs: Recent Labs    04/24/2019 0731 04/30/2019 1451  04/23/2019 2139 04/05/2019 2356 04/29/19 0143 04/29/19 0423 04/29/19 0522  HGB 12.1 13.9  --   --   --   --  13.2  --   HCT 37.2 42.6  --   --   --   --  41.5  --   PLT 229 272  --   --   --   --  220  --   APTT  --   --   --   --   --   --   --  36  LABPROT 14.5  --   --   --   --   --   --   --   INR 1.1  --   --   --   --   --   --   --   HEPARINUNFRC  --   --   --   --  0.54  --   --   --   CREATININE 14.81* 13.69*   < > 9.81*  --  8.26* 7.47*  --   TROPONINI  --  0.53*  --  0.91*  --   --  0.66*  --    < > = values in this interval not displayed.    Estimated Creatinine Clearance: 10.6 mL/min (A) (by C-G formula based on SCr of 7.47 mg/dL (H)).   Medical History: Past Medical History:  Diagnosis Date  . Anemia   . Anxiety   . Arthritis   . Cancer [facility])    Left Kidney Cancer  . CHF (congestive heart failure) ([place])   . Chronic kidney disease   . COPD (chronic obstructive pulmonary disease) ([facility])    Use Oxygen at bedtime  . Coronary artery disease   . Diabetes mellitus without complication ([place])   . Dialysis patient ([place])    Tues, Thurs, Sat  . Dyspnea    with exertion  . GERD (gastroesophageal reflux disease)   . Gout   . History of kidney stones   . Hypertension   . Neuropathy   . Peripheral vascular disease ([place])   . Sleep apnea    OSA--USE C-PAP  . Stroke [facility])  2004    Assessment: Patient is a 63yo female admitted post cardiac arrest. Patient is ESRD and missed several sessions of HD, hyperkalemia on admission with K of 7.2. Now with elevated troponin. Pharmacy consulted for Heparin drip dosing. Patient received Heparin 5000 units SQ for DVT prophylaxis, will discontinue this order. Will order Heparin 1200 units/hr IV to begin, no bolus was ordered.   Pt started on 1200 units/hr with no bolus.  5/26 @2356  HL 0.54 - continue current rate.   Goal of Therapy:  Heparin level 0.3-0.7 units/ml Monitor platelets by anticoagulation protocol: Yes   Plan:  Heparin level therapeutic. Will continue current rate. Will check a HL in 8 hours.  If therapeutic can switch to daily heparin levels. Daily CBC while on Heparin drip.  Eleonore Chiquito, PharmD, BCPS 04/29/2019 7:12 AM

## 2019-04-29 NOTE — Progress Notes (Signed)
ANTICOAGULATION CONSULT NOTE - Initial Consult  Pharmacy Consult for Heparin Indication: chest pain/ACS  Allergies  Allergen Reactions  . Iodine Rash  . Enalapril Other (See Comments)    TONGUE SWELLS/GOUT  . Iodinated Diagnostic Agents Other (See Comments)    BREAK OUT IN WHELPS      Patient Measurements: Height: 5' 5.98" (167.6 cm) Weight: 291 lb 10.7 oz (132.3 kg) IBW/kg (Calculated) : 59.26 Heparin Dosing Weight: 92.1 kg  Vital Signs: Temp: 98.2 F (36.8 C) (05/27 1545) Temp Source: Rectal (05/27 1200) BP: 75/53 (05/27 1545) Pulse Rate: 84 (05/27 1545)  Labs: Recent Labs    04/18/2019 0731 04/29/2019 1451  04/13/2019 2139 04/11/2019 2356  04/29/19 0423 04/29/19 0522 04/29/19 0837 04/29/19 1338  HGB 12.1 13.9  --   --   --   --  13.2  --   --   --   HCT 37.2 42.6  --   --   --   --  41.5  --   --   --   PLT 229 272  --   --   --   --  220  --   --   --   APTT  --   --   --   --   --   --   --  36  --   --   LABPROT 14.5  --   --   --   --   --   --   --   --   --   INR 1.1  --   --   --   --   --   --   --   --   --   HEPARINUNFRC  --   --   --   --  0.54  --   --   --   --  <0.10*  CREATININE 14.81* 13.69*   < > 9.81*  --    < > 7.47*  --  6.69* 6.84*  TROPONINI  --  0.53*  --  0.91*  --   --  0.66*  --   --   --    < > = values in this interval not displayed.    Estimated Creatinine Clearance: 11.6 mL/min (A) (by C-G formula based on SCr of 6.84 mg/dL (H)).   Medical History: Past Medical History:  Diagnosis Date  . Anemia   . Anxiety   . Arthritis   . Cancer [facility])    Left Kidney Cancer  . CHF (congestive heart failure) ([place])   . Chronic kidney disease   . COPD (chronic obstructive pulmonary disease) ([facility])    Use Oxygen at bedtime  . Coronary artery disease   . Diabetes mellitus without complication ([place])   . Dialysis patient ([place])    Tues, Thurs, Sat  . Dyspnea    with exertion  . GERD (gastroesophageal reflux disease)   . Gout   . History of  kidney stones   . Hypertension   . Neuropathy   . Peripheral vascular disease ([place])   . Sleep apnea    OSA--USE C-PAP  . Stroke [facility]) 2004    Assessment: Patient is a 63yo female admitted post cardiac arrest. Patient is ESRD and missed several sessions of HD, hyperkalemia on admission with K of 7.2. Now with elevated troponin. Pharmacy consulted for Heparin drip dosing. Patient received Heparin 5000 units SQ for DVT prophylaxis, will discontinue this order. Will order Heparin 1200 units/hr IV  to begin, no bolus was ordered.   Pt started on 1200 units/hr with no bolus.  5/26 @2356  HL 0.54 - continue current rate.  5/27 @0800  HL - not available reordered 5/27 @1338  HL <0.10  Goal of Therapy:  Heparin level 0.3-0.7 units/ml Monitor platelets by anticoagulation protocol: Yes   Plan:  Heparin level subtherapeutic. Will order Heparin 2700 units IV bolus and increase rate to 1450 units/hr. Will check a HL in 8 hours. Daily CBC while on Heparin drip.  Paulina Fusi, PharmD, BCPS 04/29/2019 4:15 PM

## 2019-04-29 NOTE — Progress Notes (Signed)
Inpatient Diabetes Program Recommendations  AACE/ADA: New Consensus Statement on Inpatient Glycemic Control (2015)  Target Ranges:  Prepandial:   less than 140 mg/dL      Peak postprandial:   less than 180 mg/dL (1-2 hours)      Critically ill patients:  140 - 180 mg/dL   Lab Results  Component Value Date   GLUCAP 106 (H) 04/29/2019   HGBA1C 6.3 (H) 03/29/2015    Review of Glycemic Control Results for [name], [name] (MRN [id number]) as of 04/29/2019 12:37  Ref. Range 04/03/2019 20:14 04/29/2019 00:34 04/29/2019 04:21 04/29/2019 07:57 04/29/2019 11:09  Glucose-Capillary Latest Ref Range: 70 - 99 mg/dL 211 (H) 166 (H) 137 (H) 105 (H) 106 (H)   Diabetes history: DM 2 Outpatient Diabetes medications:  Victoza 1.2 mg daily Current orders for Inpatient glycemic control:  Victoza 1.2 mg daily, Novolog sensitive q 4 hours  Inpatient Diabetes Program Recommendations:    Please d/c Victoza while patient is in the hospital (not available on formulary and/or not appropriate in the hospital).   Thanks, Adah Perl, RN, BC-ADM Inpatient Diabetes Coordinator Pager [phone]

## 2019-04-29 NOTE — Progress Notes (Signed)
[facility]  ROUNDING NOTE   Subjective:   CRRT 0K bath overnight. Weaning down on vasopressors. On norepinephrine only this morning.   Objective:  Vital signs in last 24 hours:  Temp:  [96.4 F (35.8 C)-98.1 F (36.7 C)] 98.1 F (36.7 C) (05/27 1130) Pulse Rate:  [77-91] 77 (05/27 1130) Resp:  [16-25] 23 (05/27 1130) BP: (67-153)/(43-95) 103/80 (05/27 1130) SpO2:  [92 %-100 %] 93 % (05/27 1130) FiO2 (%):  [40 %-50 %] 40 % (05/27 0735) Weight:  [132.3 kg] 132.3 kg (05/27 0500)  Weight change:  Filed Weights   04/27/2019 0719 04/14/2019 1150 04/29/19 0500  Weight: 125.2 kg 134.2 kg 132.3 kg    Intake/Output: I/O last 3 completed shifts: In: 1517.9 [I.V.:1387.9; NG/GT:30; IV Piggyback:100] Out: 0    Intake/Output this shift:  Total I/O In: 279.4 [I.V.:259.4; NG/GT:20] Out: 11 [Other:11]  Physical Exam: General: Critically ill   Head: ETT  Eyes:   Neck:   trachea midline  Lungs:  PRVC 40%  Heart: Regular rate and rhythm  Abdomen:  Soft, nontender, obese  Extremities:  + peripheral edema.  Neurologic: Intubated and sedated  Skin: No lesions  Access: +thrombosed left arm AVG, temp femoral right HD catheter Dr. Lanney Gins    Basic Metabolic Panel: Recent Labs  Lab 04/10/2019 1831 05/03/2019 2139 04/29/19 0143 04/29/19 0423 04/29/19 0837  NA 135 136 135 134* 134*  K 4.8 4.9 4.9 4.7 4.7  CL 97* 100 99 100 98  CO2 19* '22 23 25 22  ' GLUCOSE 257* 243* 194* 168* 151*  BUN 81* 71* 62* 55* 50*  CREATININE 11.97* 9.81* 8.26* 7.47* 6.69*  CALCIUM 8.6* 8.5* 8.0* 8.0* 8.1*  MG 2.2 2.1 1.9 1.9 1.8  PHOS 5.2* 4.3 4.8* 4.7* 4.9*    Liver Function Tests: Recent Labs  Lab 04/24/2019 0731 04/23/2019 1451 04/16/2019 1831 04/08/2019 2139 04/29/19 0143 04/29/19 0423 04/29/19 0837  AST 13* 32  --   --   --   --   --   ALT 16 41  --   --   --   --   --   ALKPHOS 206* 218*  --   --   --   --   --   BILITOT 0.9 0.9  --   --   --   --   --   PROT 7.7 7.1  --   --   --    --   --   ALBUMIN 3.6 3.3* 3.2* 3.0* 3.0* 3.0* 2.8*   Recent Labs  Lab 04/14/2019 0731  LIPASE 59*   No results for input(s): AMMONIA in the last 168 hours.  CBC: Recent Labs  Lab 04/04/2019 0731 04/22/2019 1451 04/29/19 0423  WBC 8.2 17.7* 25.5*  NEUTROABS 6.0 14.7*  --   HGB 12.1 13.9 13.2  HCT 37.2 42.6 41.5  MCV 90.1 90.3 90.6  PLT 229 272 220    Cardiac Enzymes: Recent Labs  Lab 04/24/2019 1451 05/03/2019 2139 04/29/19 0423  TROPONINI 0.53* 0.91* 0.66*    BNP: Invalid input(s): POCBNP  CBG: Recent Labs  Lab 05/02/2019 2014 04/29/19 0034 04/29/19 0421 04/29/19 0757 04/29/19 1109  GLUCAP 211* 166* 137* 105* 106*    Microbiology: Results for orders placed or performed during the hospital encounter of 04/04/2019  SARS Coronavirus 2 (CEPHEID - Performed in [place] hospital lab), Hosp Order     Status: None   Collection Time: 04/07/2019  7:31 AM  Result Value Ref Range Status  SARS Coronavirus 2 NEGATIVE NEGATIVE Final    Comment: (NOTE) If result is NEGATIVE SARS-CoV-2 target nucleic acids are NOT DETECTED. The SARS-CoV-2 RNA is generally detectable in upper and lower  respiratory specimens during the acute phase of infection. The lowest  concentration of SARS-CoV-2 viral copies this assay can detect is 250  copies / mL. A negative result does not preclude SARS-CoV-2 infection  and should not be used as the sole basis for treatment or other  patient management decisions.  A negative result may occur with  improper specimen collection / handling, submission of specimen other  than nasopharyngeal swab, presence of viral mutation(s) within the  areas targeted by this assay, and inadequate number of viral copies  (<250 copies / mL). A negative result must be combined with clinical  observations, patient history, and epidemiological information. If result is POSITIVE SARS-CoV-2 target nucleic acids are DETECTED. The SARS-CoV-2 RNA is generally detectable in upper  and lower  respiratory specimens dur ing the acute phase of infection.  Positive  results are indicative of active infection with SARS-CoV-2.  Clinical  correlation with patient history and other diagnostic information is  necessary to determine patient infection status.  Positive results do  not rule out bacterial infection or co-infection with other viruses. If result is PRESUMPTIVE POSTIVE SARS-CoV-2 nucleic acids MAY BE PRESENT.   A presumptive positive result was obtained on the submitted specimen  and confirmed on repeat testing.  While 2019 novel coronavirus  (SARS-CoV-2) nucleic acids may be present in the submitted sample  additional confirmatory testing may be necessary for epidemiological  and / or clinical management purposes  to differentiate between  SARS-CoV-2 and other Sarbecovirus currently known to infect humans.  If clinically indicated additional testing with an alternate test  methodology [phone]) is advised. The SARS-CoV-2 RNA is generally  detectable in upper and lower respiratory sp ecimens during the acute  phase of infection. The expected result is Negative. Fact Sheet for Patients:  StrictlyIdeas.no Fact Sheet for Healthcare Providers: BankingDealers.co.za This test is not yet approved or cleared by the [place] FDA and has been authorized for detection and/or diagnosis of SARS-CoV-2 by FDA under an Emergency Use Authorization (EUA).  This EUA will remain in effect (meaning this test can be used) for the duration of the COVID-19 declaration under Section 564(b)(1) of the Act, 21 U.S.C. section 360bbb-3(b)(1), unless the authorization is terminated or revoked sooner. Performed at [facility], [address] [place], [place] [address]   MRSA PCR Screening     Status: None   Collection Time: 04/16/2019  9:04 AM  Result Value Ref Range Status   MRSA by PCR NEGATIVE NEGATIVE Final    Comment:         The GeneXpert MRSA Assay (FDA approved for NASAL specimens only), is one component of a comprehensive MRSA colonization surveillance program. It is not intended to diagnose MRSA infection nor to guide or monitor treatment for MRSA infections. Performed at [facility], [address] [place], [place] [address]     Coagulation Studies: Recent Labs    05/01/2019 0731  LABPROT 14.5  INR 1.1    Urinalysis: No results for input(s): COLORURINE, LABSPEC, PHURINE, GLUCOSEU, HGBUR, BILIRUBINUR, KETONESUR, PROTEINUR, UROBILINOGEN, NITRITE, LEUKOCYTESUR in the last 72 hours.  Invalid input(s): APPERANCEUR    Imaging: Dg Chest Port 1 View  Result Date: 04/29/2019 CLINICAL DATA:  Acute respiratory failure EXAM: PORTABLE CHEST 1 VIEW COMPARISON:  Yesterday FINDINGS: Endotracheal tube  tip between the clavicular heads and carina. Left subclavian dialysis catheter with tip at the brachiocephalic SVC confluence. There is an orogastric tube which at least reaches the stomach. Haziness of the bilateral lower chest with obscuration of the left diaphragm. No Kerley lines, effusion, or pneumothorax. Cardiomegaly.  Rightward rotation. IMPRESSION: 1. Stable hardware positioning. 2. Worsening lower lobe aeration. Electronically Signed   By: Monte Fantasia M.D.   On: 04/29/2019 06:50   Dg Chest Port 1 View  Result Date: 05/02/2019 CLINICAL DATA:  63 year old female history intubation EXAM: PORTABLE CHEST 1 VIEW COMPARISON:  04/10/2019, 03/28/2015 FINDINGS: Cardiomediastinal silhouette unchanged in size and contour with low lung volumes accentuating the diameter of the mediastinum. Asymmetric elevation of the right hemidiaphragm. Coarsened interstitial markings with interlobular septal thickening and thickening of the minor fissure. Patchy opacity in the right lung base again demonstrated. Interval placement of endotracheal tube which terminates 2.5 cm above the carina. Interval placement of  gastric tube terminating out of the field of view. Interval placement of defibrillator pads. Left upper extremity HERO graft, with the tip terminating in the region of the superior vena cava. IMPRESSION: Interval placement of endotracheal tube terminating 2.5 cm above the carina. Interval placement of gastric tube terminating in the abdomen out of the field of view. Interval placement of defibrillator pads. Similar appearance of low lung volumes, with background changes of either chronic scarring and/or early pulmonary edema, with similar appearance airspace disease at the right lung base. Left upper extremity HeRO graft Electronically Signed   By: Corrie Mckusick D.O.   On: 04/25/2019 13:18   Dg Chest Portable 1 View  Result Date: 04/22/2019 CLINICAL DATA:  Short of breath EXAM: PORTABLE CHEST 1 VIEW COMPARISON:  03/28/2015 FINDINGS: New left upper extremity and jugular hair 0 catheter with its tip in the upper SVC. Cardiomegaly. Normal vascularity. Bibasilar opacities likely combination of volume loss and pleural fluid. No pneumothorax. Triangular opacity at the medial right lung base is noted representing either consolidation in the right lower lobe or collapse. IMPRESSION: Right lower lobe collapse versus consolidation. Bibasilar opacities likely combination of pleural fluid and atelectasis. Electronically Signed   By: Marybelle Killings M.D.   On: 04/05/2019 08:04   Dg Abd Portable 1v  Result Date: 04/21/2019 CLINICAL DATA:  OG tube placement. EXAM: PORTABLE ABDOMEN - 1 VIEW COMPARISON:  One-view chest x-ray FINDINGS: OG tube terminates in the stomach. The stomach is mildly distended. The lung bases are clear. IMPRESSION: 1. OG tube terminates in the stomach. Electronically Signed   By: San Morelle M.D.   On: 04/03/2019 13:29     Medications:   . ceFEPime (MAXIPIME) IV 2 g (04/29/19 1036)  . dexmedetomidine (PRECEDEX) IV infusion 0.4 mcg/kg/hr (04/29/19 1111)  . heparin 1,200 Units/hr (04/15/2019  1604)  . norepinephrine (LEVOPHED) Adult infusion 4 mcg/min (04/29/19 1142)  . phenylephrine (NEO-SYNEPHRINE) Adult infusion Stopped (04/11/2019 1600)  . pureflow 2,000 mL/hr at 04/29/19 0954  . vasopressin (PITRESSIN) infusion - *FOR SHOCK* Stopped (04/18/2019 2300)   . [START ON 04/30/2019] allopurinol  150 mg Per Tube Once per day on Tue Thu Sat  . amitriptyline  25 mg Per Tube QHS  . atorvastatin  40 mg Per Tube Daily  . chlorhexidine gluconate (MEDLINE KIT)  15 mL Mouth Rinse BID  . Chlorhexidine Gluconate Cloth  6 each Topical Daily  . citalopram  40 mg Per Tube Daily  . clopidogrel  75 mg Per Tube Daily  . docusate  200 mg  Per Tube BID  . feeding supplement (PRO-STAT SUGAR FREE 64)  60 mL Per Tube QID  . feeding supplement (VITAL HIGH PROTEIN)  1,000 mL Per Tube Q24H  . fluticasone  2 spray Each Nare Daily  . insulin aspart  0-9 Units Subcutaneous Q4H  . liraglutide  1.2 mg Subcutaneous Daily  . loratadine  10 mg Per Tube QHS  . mouth rinse  15 mL Mouth Rinse 10 times per day  . multivitamin  15 mL Per Tube Daily  . sevelamer carbonate  1,600 mg Per Tube TID WC  . sodium chloride flush  10-40 mL Intracatheter Q12H   albuterol, calcium carbonate, heparin, lidocaine-prilocaine, nystatin, polyethylene glycol powder, polyvinyl alcohol, sodium chloride flush  Assessment/ Plan:  Ms. [name] is a 63 y.o. black female with end stage renal disease on hemodialysis, hypertension, CVA, obstructive sleep apnea, peripheral vascular disease, hypertension, gout, GERD, diabetes mellitus type II, COPD, coronary artery disease, congestive heart failure, who  was admitted to [facility] on 04/18/2019 for pneumonia. Acute respiratory failure requiring mechanical ventilation and vasopressors.   TTS [facility] left AVG   1. End stage renal disease with hyperkalemia Complication of dialysis device.  - temp HD catheter placed by ICU - Continue Continuous renal replacement therapy:  CVVHD 2K bath.   - Appreciate vascular input. Thrombectomy once patient is stable.  - Start ultrafiltration today.   2. Anemia of chronic kidney disease: holding epo.    3. Sepsis/Hypotension: weaning off vsopressors. Leukocytosis increasing.  - Empiric antibiotics.   LOS: 1 Sarath Kolluru 5/27/202012:52 PM

## 2019-04-29 NOTE — Progress Notes (Signed)
Initial Nutrition Assessment  RD working remotely.  DOCUMENTATION CODES:   Morbid obesity  INTERVENTION:  Initiate Vital High Protein at 20 mL/hr (480 mL goal daily volume) + Pro-Stat 60 mL QID per tube. Provides 1280 kcal, 162 grams of protein, 403 mL H2O daily.  Provide liquid MVI daily per tube.  Provide minimum free water flush of 30 mL Q4hrs to maintain tube patency.  NUTRITION DIAGNOSIS:   Inadequate oral intake related to inability to eat as evidenced by NPO status.  GOAL:   Provide needs based on ASPEN/SCCM guidelines  MONITOR:   Vent status, Labs, Weight trends, TF tolerance, Skin, I & O's  REASON FOR ASSESSMENT:   Ventilator, Consult Enteral/tube feeding initiation and management  ASSESSMENT:   63 year old female with PMHx of DM, ESRD on HD, HTN, gout, hx CVA 2004, PVD, CHF, OSA, COPD, anxiety, GERD, arthritis, neuropathy, hx renal cell carcinoma s/p partial left nephrectomy, CAD admitted after cardiac arrest s/p ACLS and intubation on 5/26, with severe hyperkalemia.   Patient intubated and sedated. On PRVC mode with FiO2 40% and PEEP 5 cmH2O. Abdomen soft per RN documentation. Last BM was 5/26. Weights appear stable in chart. Plan is to initiate trickle tube feeds today. Patient is on CRRT. Goal UF rate is 25 mL/hr per order in chart.  Enteral Access: 18 Fr. OGT placed 5/26; terminates in stomach per abdominal x-ray 5/26; 68 cm at corner of mouth  MAP: 52-89 mmHg  Patient is currently intubated on ventilator support Ve: 9.7 L/min Temp (24hrs), Avg:97.3 F (36.3 C), Min:96.4 F (35.8 C), Max:98.1 F (36.7 C)  Propofol: N/A  Medications reviewed and include: allopurinol, Colace 200 mg BID, Novolog 0-9 units Q4hrs, Renvela 1600 mg TID, cefepime, Precedex gtt, norepinephrine gtt 4 mcg/min.  Labs reviewed: CBG 104-166. Sodium 134, BUN 50, Creatinine 6.69, Phosphorus 4.9.  I/O: pt anuric  NUTRITION - FOCUSED PHYSICAL EXAM:  Unable to complete at this  time.  Diet Order:   Diet Order            Diet NPO time specified  Diet effective now             EDUCATION NEEDS:   No education needs have been identified at this time  Skin:  Skin Assessment: Skin Integrity Issues:(MSAD to bilateral breasts)  Last BM:  04/30/2019 per chart  Height:   Ht Readings from Last 1 Encounters:  04/29/19 5' 5.98" (1.676 m)   Weight:   Wt Readings from Last 1 Encounters:  04/29/19 132.3 kg   Ideal Body Weight:  59 kg  BMI:  Body mass index is 47.1 kg/m.  Estimated Nutritional Needs:   Kcal:  1455-1852 (11-14 kcal/kg)  Protein:  148 grams (2.5 grams/kg IBW)  Fluid:  UOP + 1 L  Willey Blade, MS, RD, LDN Office: [phone] Pager: [phone] After Hours/Weekend Pager: [phone]

## 2019-04-29 NOTE — Progress Notes (Signed)
 CRITICAL CARE NOTE        SUBJECTIVE FINDINGS & SIGNIFICANT EVENTS   Patient remains critically ill Prognosis is guarded  Moving all 4 extermities this am.  Weaned off Vasopressin, still on high dose Levophed.  Plan for neuro check post weaning of sedatives.  PAST MEDICAL HISTORY   Past Medical History:  Diagnosis Date  . Anemia   . Anxiety   . Arthritis   . Cancer (HCC)    Left Kidney Cancer  . CHF (congestive heart failure) (HCC)   . Chronic kidney disease   . COPD (chronic obstructive pulmonary disease) (HCC)    Use Oxygen at bedtime  . Coronary artery disease   . Diabetes mellitus without complication (HCC)   . Dialysis patient (HCC)    Tues, Thurs, Sat  . Dyspnea    with exertion  . GERD (gastroesophageal reflux disease)   . Gout   . History of kidney stones   . Hypertension   . Neuropathy   . Peripheral vascular disease (HCC)   . Sleep apnea    OSA--USE C-PAP  . Stroke (HCC) 2004     SURGICAL HISTORY   Past Surgical History:  Procedure Laterality Date  . A/V FISTULAGRAM Left 04/26/2017   Procedure: A/V Fistulagram;  Surgeon: Schnier, Gregory G, MD;  Location: [facility];  Service: Cardiovascular;  Laterality: Left;  . ABDOMINAL HYSTERECTOMY    . CARDIAC CATHETERIZATION    . DIALYSIS/PERMA CATHETER INSERTION N/A 01/31/2017   Procedure: Dialysis/Perma Catheter Insertion;  Surgeon: Jason S Dew, MD;  Location: [facility];  Service: Cardiovascular;  Laterality: N/A;  . DIALYSIS/PERMA CATHETER INSERTION N/A 02/21/2017   Procedure: Dialysis/Perma Catheter Insertion;  Surgeon: Jason S Dew, MD;  Location: [facility];  Service: Cardiovascular;  Laterality: N/A;  . DIALYSIS/PERMA CATHETER INSERTION N/A 03/19/2017   Procedure: Dialysis/Perma Catheter Insertion;   Surgeon: Gregory G Schnier, MD;  Location: [facility];  Service: Cardiovascular;  Laterality: N/A;  . DIALYSIS/PERMA CATHETER REMOVAL N/A 07/19/2017   Procedure: DIALYSIS/PERMA CATHETER REMOVAL;  Surgeon: Schnier, Gregory G, MD;  Location: [facility];  Service: Cardiovascular;  Laterality: N/A;  . EYE SURGERY Bilateral    Cataract Extraction with IOL  . KIDNEY SURGERY Left    Partial Nephrectomy  . LEFT HEART CATH AND CORONARY ANGIOGRAPHY N/A 04/05/2017   Procedure: Left Heart Cath and Coronary Angiography;  Surgeon: Muhammad A Arida, MD;  Location: [facility];  Service: Cardiovascular;  Laterality: N/A;  . PERIPHERAL VASCULAR CATHETERIZATION N/A 01/23/2016   Procedure: Dialysis/Perma Catheter Insertion;  Surgeon: Jason S Dew, MD;  Location: [facility];  Service: Cardiovascular;  Laterality: N/A;  . PERIPHERAL VASCULAR CATHETERIZATION Left 10/01/2016   Procedure: A/V Shuntogram/Fistulagram;  Surgeon: Jason S Dew, MD;  Location: [facility];  Service: Cardiovascular;  Laterality: Left;  . PERIPHERAL VASCULAR THROMBECTOMY Left 11/24/2018   Procedure: PERIPHERAL VASCULAR THROMBECTOMY;  Surgeon: Dew, Jason S, MD;  Location: [facility];  Service: Cardiovascular;  Laterality: Left;  . UPPER EXTREMITY VENOGRAPHY Bilateral 04/26/2017   Procedure: Upper Extremity Venography;  Surgeon: Schnier, Gregory G, MD;  Location: [facility];  Service: Cardiovascular;  Laterality: Bilateral;  . VASCULAR ACCESS DEVICE INSERTION Left 05/22/2017   Procedure: INSERTION OF HERO VASCULAR ACCESS DEVICE;  Surgeon: Schnier, Gregory G, MD;  Location: [facility];  Service: Vascular;  Laterality: Left;     FAMILY HISTORY   Family History    Problem Relation Age of Onset  . Hypertension Mother   . Heart failure Mother   . Diabetes Father   . Heart attack Father   . Lung cancer Maternal Aunt   . Cancer Maternal Aunt      SOCIAL HISTORY   Social History    Tobacco Use  . Smoking status: Former Smoker  . Smokeless tobacco: Never Used  Substance Use Topics  . Alcohol use: No  . Drug use: No     MEDICATIONS   Current Medication:  Current Facility-Administered Medications:  .  albuterol (PROVENTIL) (2.5 MG/3ML) 0.083% nebulizer solution 2.5 mg, 2.5 mg, Nebulization, Q6H PRN, Shah, Vipul, MD .  [START ON 04/30/2019] allopurinol (ZYLOPRIM) tablet 150 mg, 150 mg, Per Tube, Once per day on Tue Thu Sat, Shah, Vipul, MD .  amitriptyline (ELAVIL) tablet 25 mg, 25 mg, Per Tube, QHS, Shah, Vipul, MD, 25 mg at 05/01/2019 2206 .  atorvastatin (LIPITOR) tablet 40 mg, 40 mg, Per Tube, Daily, Shah, Vipul, MD .  calcium carbonate (TUMS - dosed in mg elemental calcium) chewable tablet 200 mg of elemental calcium, 1 tablet, Oral, PRN, Ouma, Elizabeth Achieng, NP .  ceFEPIme (MAXIPIME) 2 g in sodium chloride 0.9 % 100 mL IVPB, 2 g, Intravenous, Q12H, Kluttz, Lisa G, RPH, Last Rate: 200 mL/hr at 04/19/2019 2200, 2 g at 04/18/2019 2200 .  chlorhexidine gluconate (MEDLINE KIT) (PERIDEX) 0.12 % solution 15 mL, 15 mL, Mouth Rinse, BID, Aleskerov, Fuad, MD, 15 mL at 04/29/19 0858 .  Chlorhexidine Gluconate Cloth 2 % PADS 6 each, 6 each, Topical, Daily, Shah, Vipul, MD, 6 each at 04/29/19 0951 .  citalopram (CELEXA) tablet 40 mg, 40 mg, Per Tube, Daily, Shah, Vipul, MD, 40 mg at 04/29/19 0951 .  clopidogrel (PLAVIX) tablet 75 mg, 75 mg, Per Tube, Daily, Shah, Vipul, MD, 75 mg at 04/29/19 0951 .  dexmedetomidine (PRECEDEX) 400 MCG/100ML (4 mcg/mL) infusion, 0.4-1.2 mcg/kg/hr, Intravenous, Titrated, Aleskerov, Fuad, MD, Last Rate: 18.78 mL/hr at 04/29/19 0952, 0.6 mcg/kg/hr at 04/29/19 0952 .  docusate (COLACE) 50 MG/5ML liquid 200 mg, 200 mg, Per Tube, BID, Kluttz, Lisa G, RPH .  fluticasone (FLONASE) 50 MCG/ACT nasal spray 2 spray, 2 spray, Each Nare, Daily, Ouma, Elizabeth Achieng, NP .  heparin ADULT infusion 100 units/mL (25000 units/250mL sodium chloride 0.45%), 1,200  Units/hr, Intravenous, Continuous, Kluttz, Lisa G, RPH, Last Rate: 12 mL/hr at 05/03/2019 1604, 1,200 Units/hr at 04/20/2019 1604 .  heparin injection 1,000-6,000 Units, 1,000-6,000 Units, CRRT, PRN, Kolluru, Sarath, MD .  insulin aspart (novoLOG) injection 0-9 Units, 0-9 Units, Subcutaneous, Q4H, Aleskerov, Fuad, MD, 1 Units at 04/29/19 0433 .  lidocaine-prilocaine (EMLA) cream 1 application, 1 application, Topical, PRN, Ouma, Elizabeth Achieng, NP .  liraglutide (VICTOZA) SOPN 1.2 mg, 1.2 mg, Subcutaneous, Daily, Ouma, Elizabeth Achieng, NP .  loratadine (CLARITIN) tablet 10 mg, 10 mg, Per Tube, QHS, Kluttz, Lisa G, RPH .  MEDLINE mouth rinse, 15 mL, Mouth Rinse, 10 times per day, Aleskerov, Fuad, MD, 15 mL at 04/29/19 0953 .  norepinephrine (LEVOPHED) 16 mg in dextrose 5 % 250 mL (0.064 mg/mL) infusion, 0-40 mcg/min, Intravenous, Titrated, Aleskerov, Fuad, MD, Last Rate: 7.5 mL/hr at 04/29/19 0635, 8 mcg/min at 04/29/19 0635 .  nystatin (MYCOSTATIN/NYSTOP) topical powder 1 g, 1 g, Topical, Daily PRN, Ouma, Elizabeth Achieng, NP .  phenylephrine (NEO-SYNEPHRINE) 40 mg in sodium chloride 0.9 % 250 mL (0.16 mg/mL) infusion, 0-400 mcg/min, Intravenous, Titrated, Aleskerov, Fuad, MD, Stopped at 04/04/2019 1600 .  polyethylene glycol   powder (GLYCOLAX/MIRALAX) container 17 g, 17 g, Oral, TID PRN, Ouma, Elizabeth Achieng, NP .  polyvinyl alcohol (LIQUIFILM TEARS) 1.4 % ophthalmic solution 1-2 drop, 1-2 drop, Both Eyes, QID PRN, Ouma, Elizabeth Achieng, NP .  pureflow IV solution for Dialysis, , CRRT, Continuous, Kolluru, Sarath, MD, Last Rate: 2,000 mL/hr at 04/29/19 0954 .  sevelamer carbonate (RENVELA) tablet 1,600 mg, 1,600 mg, Per Tube, TID WC, Kluttz, Lisa G, RPH .  sodium chloride flush (NS) 0.9 % injection 10-40 mL, 10-40 mL, Intracatheter, Q12H, Aleskerov, Fuad, MD, 10 mL at 04/26/2019 2200 .  sodium chloride flush (NS) 0.9 % injection 10-40 mL, 10-40 mL, Intracatheter, PRN, Aleskerov, Fuad, MD .   vasopressin (PITRESSIN) 40 Units in sodium chloride 0.9 % 250 mL (0.16 Units/mL) infusion, 0.03 Units/min, Intravenous, Continuous, Aleskerov, Fuad, MD, Stopped at 04/10/2019 2300    ALLERGIES   Iodine; Enalapril; and Iodinated diagnostic agents    REVIEW OF SYSTEMS     Unable to obtain due to mechanical ventilation intubation  PHYSICAL EXAMINATION   Vitals:   04/29/19 0900 04/29/19 0930  BP: (!) 88/54 (!) 67/43  Pulse:    Resp: (!) 21 (!) 21  Temp: (!) 97.2 F (36.2 C) (!) 97.3 F (36.3 C)  SpO2: 99%     GENERAL: Chronically ill-appearing HEAD: Normocephalic, atraumatic.  EYES: Pupils equal, round, reactive to light.  No scleral icterus.  MOUTH: Moist mucosal membrane. NECK: Supple. No thyromegaly. No nodules. No JVD.  PULMONARY: Mild bibasilar crackles CARDIOVASCULAR: S1 and S2. Regular rate and rhythm. No murmurs, rubs, or gallops.  GASTROINTESTINAL: Soft, nontender, non-distended. No masses. Positive bowel sounds. No hepatosplenomegaly.  MUSCULOSKELETAL: No swelling, clubbing, or edema.  NEUROLOGIC: Mild distress due to acute illness SKIN:intact,warm,dry   LABS AND IMAGING     LAB RESULTS: Recent Labs  Lab 04/29/19 0143 04/29/19 0423 04/29/19 0837  NA 135 134* 134*  K 4.9 4.7 4.7  CL 99 100 98  CO2 23 25 22  BUN 62* 55* 50*  CREATININE 8.26* 7.47* 6.69*  GLUCOSE 194* 168* 151*   Recent Labs  Lab 05/01/2019 0731 04/30/2019 1451 04/29/19 0423  HGB 12.1 13.9 13.2  HCT 37.2 42.6 41.5  WBC 8.2 17.7* 25.5*  PLT 229 272 220     IMAGING RESULTS: Dg Chest Port 1 View  Result Date: 04/29/2019 CLINICAL DATA:  Acute respiratory failure EXAM: PORTABLE CHEST 1 VIEW COMPARISON:  Yesterday FINDINGS: Endotracheal tube tip between the clavicular heads and carina. Left subclavian dialysis catheter with tip at the brachiocephalic SVC confluence. There is an orogastric tube which at least reaches the stomach. Haziness of the bilateral lower chest with obscuration  of the left diaphragm. No Kerley lines, effusion, or pneumothorax. Cardiomegaly.  Rightward rotation. IMPRESSION: 1. Stable hardware positioning. 2. Worsening lower lobe aeration. Electronically Signed   By: Jonathon  Watts M.D.   On: 04/29/2019 06:50   Dg Chest Port 1 View  Result Date: 04/07/2019 CLINICAL DATA:  63-year-old female history intubation EXAM: PORTABLE CHEST 1 VIEW COMPARISON:  04/11/2019, 03/28/2015 FINDINGS: Cardiomediastinal silhouette unchanged in size and contour with low lung volumes accentuating the diameter of the mediastinum. Asymmetric elevation of the right hemidiaphragm. Coarsened interstitial markings with interlobular septal thickening and thickening of the minor fissure. Patchy opacity in the right lung base again demonstrated. Interval placement of endotracheal tube which terminates 2.5 cm above the carina. Interval placement of gastric tube terminating out of the field of view. Interval placement of defibrillator pads. Left upper extremity HERO graft,   with the tip terminating in the region of the superior vena cava. IMPRESSION: Interval placement of endotracheal tube terminating 2.5 cm above the carina. Interval placement of gastric tube terminating in the abdomen out of the field of view. Interval placement of defibrillator pads. Similar appearance of low lung volumes, with background changes of either chronic scarring and/or early pulmonary edema, with similar appearance airspace disease at the right lung base. Left upper extremity HeRO graft Electronically Signed   By: Jaime  Wagner D.O.   On: 04/06/2019 13:18   Dg Abd Portable 1v  Result Date: 04/27/2019 CLINICAL DATA:  OG tube placement. EXAM: PORTABLE ABDOMEN - 1 VIEW COMPARISON:  One-view chest x-ray FINDINGS: OG tube terminates in the stomach. The stomach is mildly distended. The lung bases are clear. IMPRESSION: 1. OG tube terminates in the stomach. Electronically Signed   By: Christopher  Mattern M.D.   On: 04/03/2019  13:29      ASSESSMENT AND PLAN    -Multidisciplinary rounds held today  Acute comatose state    -mental status improved now - patient able to follow verbal communication inconsistently    -Status post endotracheal intubation -continue Full MV support -continue Bronchodilator Therapy -Wean Fio2 and PEEP as tolerated -will perform SAT/SBT when respiratory parameters are met -Discussed with Husband Tommy Kingsbury    Severe hyperkalemia      - HCO3-, insulin, Veltasa, Calcium gluconate    - discussed with Nephro - starting dialysis emergently -oxygen as needed -follow up cardiac enzymes as indicated ICU monitoring    NSTEMI    -Status post cardiac arrest and ACLS    -Trending troponin 0 0.53- trending down     -Empirically initiating heparin drip    Renal Failure-Renal failure ESRD     - starting CRRT - L trialysis cath  nephro on case - appreciate input -follow chem 7 -follow UO -continue Foley Catheter-assess need daily -did not take off fluid  NEUROLOGY - intubated and sedated - minimal sedation to achieve a RASS goal: -1 Wake up assessment pending    ID -continue IV abx as prescibed -follow up cultures  GI/Nutrition GI PROPHYLAXIS as indicated DIET-->TF's as tolerated Constipation protocol as indicated  ENDO - ICU hypoglycemic\Hyperglycemia protocol -check FSBS per protocol   ELECTROLYTES -follow labs as needed -replace as needed -pharmacy consultation   DVT/GI PRX ordered -SCDs  TRANSFUSIONS AS NEEDED MONITOR FSBS ASSESS the need for LABS as needed   Critical care provider statement:   Critical care time (minutes): 32  Critical care time was exclusive of: Separately billable procedures and treating other patients  Critical care was necessary to treat or prevent imminent or life-threatening deterioration of the following conditions:  Circulatory shock, cardiac arrest, acute comatose state, severe hyperkalemia, AV  graft fistula malfunction, fluid overload, diabetes, OSA, multiple comorbid conditions  Critical care was time spent personally by me on the following activities: Development of treatment plan with patient or surrogate, discussions with consultants, evaluation of patient's response to treatment, examination of patient, obtaining history from patient or surrogate, ordering and performing treatments and interventions, ordering and review of laboratory studies and re-evaluation of patient's condition.  I assumed direction of critical care for this patient from another provider in my specialty: no    This document was prepared using Dragon voice recognition software and may include unintentional dictation errors.    Fuad Aleskerov, M.D.  Division of Pulmonary & Critical Care Medicine  [facility]        

## 2019-04-30 LAB — RENAL FUNCTION PANEL
Albumin: 2.6 g/dL — ABNORMAL LOW (ref 3.5–5.0)
Albumin: 2.6 g/dL — ABNORMAL LOW (ref 3.5–5.0)
Albumin: 2.5 g/dL — ABNORMAL LOW (ref 3.5–5.0)
Albumin: 2.3 g/dL — ABNORMAL LOW (ref 3.5–5.0)
Albumin: 2.3 g/dL — ABNORMAL LOW (ref 3.5–5.0)
Anion gap: 12 (ref 5–15)
Anion gap: 11 (ref 5–15)
Anion gap: 10 (ref 5–15)
Anion gap: 8 (ref 5–15)
Anion gap: 8 (ref 5–15)
BUN: 41 mg/dL — ABNORMAL HIGH (ref 8–23)
BUN: 50 mg/dL — ABNORMAL HIGH (ref 8–23)
BUN: 51 mg/dL — ABNORMAL HIGH (ref 8–23)
BUN: 39 mg/dL — ABNORMAL HIGH (ref 8–23)
BUN: 42 mg/dL — ABNORMAL HIGH (ref 8–23)
CO2: 22 mmol/L (ref 22–32)
CO2: 23 mmol/L (ref 22–32)
CO2: 24 mmol/L (ref 22–32)
CO2: 26 mmol/L (ref 22–32)
CO2: 26 mmol/L (ref 22–32)
Calcium: 8 mg/dL — ABNORMAL LOW (ref 8.9–10.3)
Calcium: 7.9 mg/dL — ABNORMAL LOW (ref 8.9–10.3)
Calcium: 7.8 mg/dL — ABNORMAL LOW (ref 8.9–10.3)
Calcium: 7.7 mg/dL — ABNORMAL LOW (ref 8.9–10.3)
Calcium: 7.6 mg/dL — ABNORMAL LOW (ref 8.9–10.3)
Chloride: 100 mmol/L (ref 98–111)
Chloride: 100 mmol/L (ref 98–111)
Chloride: 100 mmol/L (ref 98–111)
Chloride: 101 mmol/L (ref 98–111)
Chloride: 100 mmol/L (ref 98–111)
Creatinine, Ser: 4.87 mg/dL — ABNORMAL HIGH (ref 0.44–1.00)
Creatinine, Ser: 5.17 mg/dL — ABNORMAL HIGH (ref 0.44–1.00)
Creatinine, Ser: 5.3 mg/dL — ABNORMAL HIGH (ref 0.44–1.00)
Creatinine, Ser: 3.24 mg/dL — ABNORMAL HIGH (ref 0.44–1.00)
Creatinine, Ser: 3.38 mg/dL — ABNORMAL HIGH (ref 0.44–1.00)
GFR calc Af Amer: 10 mL/min — ABNORMAL LOW (ref >60)
GFR calc Af Amer: 9 mL/min — ABNORMAL LOW (ref >60)
GFR calc Af Amer: 9 mL/min — ABNORMAL LOW (ref >60)
GFR calc Af Amer: 17 mL/min — ABNORMAL LOW (ref >60)
GFR calc Af Amer: 16 mL/min — ABNORMAL LOW (ref >60)
GFR calc non Af Amer: 9 mL/min — ABNORMAL LOW (ref >60)
GFR calc non Af Amer: 8 mL/min — ABNORMAL LOW (ref >60)
GFR calc non Af Amer: 8 mL/min — ABNORMAL LOW (ref >60)
GFR calc non Af Amer: 14 mL/min — ABNORMAL LOW (ref >60)
GFR calc non Af Amer: 14 mL/min — ABNORMAL LOW (ref >60)
Glucose, Bld: 196 mg/dL — ABNORMAL HIGH (ref 70–99)
Glucose, Bld: 229 mg/dL — ABNORMAL HIGH (ref 70–99)
Glucose, Bld: 229 mg/dL — ABNORMAL HIGH (ref 70–99)
Glucose, Bld: 188 mg/dL — ABNORMAL HIGH (ref 70–99)
Glucose, Bld: 202 mg/dL — ABNORMAL HIGH (ref 70–99)
Phosphorus: 4.3 mg/dL (ref 2.5–4.6)
Phosphorus: 4.8 mg/dL — ABNORMAL HIGH (ref 2.5–4.6)
Phosphorus: 4.7 mg/dL — ABNORMAL HIGH (ref 2.5–4.6)
Phosphorus: 3.1 mg/dL (ref 2.5–4.6)
Phosphorus: 3.1 mg/dL (ref 2.5–4.6)
Potassium: 4.3 mmol/L (ref 3.5–5.1)
Potassium: 4.4 mmol/L (ref 3.5–5.1)
Potassium: 4.4 mmol/L (ref 3.5–5.1)
Potassium: 3.6 mmol/L (ref 3.5–5.1)
Potassium: 3.6 mmol/L (ref 3.5–5.1)
Sodium: 134 mmol/L — ABNORMAL LOW (ref 135–145)
Sodium: 134 mmol/L — ABNORMAL LOW (ref 135–145)
Sodium: 134 mmol/L — ABNORMAL LOW (ref 135–145)
Sodium: 135 mmol/L (ref 135–145)
Sodium: 134 mmol/L — ABNORMAL LOW (ref 135–145)

## 2019-04-30 LAB — BLOOD GAS, ARTERIAL
Acid-base deficit: 1.7 mmol/L (ref 0.0–2.0)
Bicarbonate: 25.2 mmol/L (ref 20.0–28.0)
FIO2: 0.7
MECHVT: 430 mL
O2 Saturation: 96.9 %
PEEP: 5 cmH2O
Patient temperature: 37
RATE: 15 resp/min
pCO2 arterial: 50 mmHg — ABNORMAL HIGH (ref 32.0–48.0)
pH, Arterial: 7.31 — ABNORMAL LOW (ref 7.350–7.450)
pO2, Arterial: 97 mmHg (ref 83.0–108.0)

## 2019-04-30 LAB — APTT: aPTT: 57 seconds — ABNORMAL HIGH (ref 24–36)

## 2019-04-30 LAB — HEPARIN LEVEL (UNFRACTIONATED)
Heparin Unfractionated: <0.1 IU/mL — ABNORMAL LOW (ref 0.30–0.70)
Heparin Unfractionated: 0.7 IU/mL (ref 0.30–0.70)

## 2019-04-30 LAB — CBC WITH DIFFERENTIAL/PLATELET
Abs Immature Granulocytes: 0.23 10*3/uL — ABNORMAL HIGH (ref 0.00–0.07)
Basophils Absolute: 0.1 10*3/uL (ref 0.0–0.1)
Basophils Relative: 0 %
Eosinophils Absolute: 0 10*3/uL (ref 0.0–0.5)
Eosinophils Relative: 0 %
HCT: 34.9 % — ABNORMAL LOW (ref 36.0–46.0)
Hemoglobin: 11.1 g/dL — ABNORMAL LOW (ref 12.0–15.0)
Immature Granulocytes: 1 %
Lymphocytes Relative: 8 %
Lymphs Abs: 1.4 10*3/uL (ref 0.7–4.0)
MCH: 29.1 pg (ref 26.0–34.0)
MCHC: 31.8 g/dL (ref 30.0–36.0)
MCV: 91.6 fL (ref 80.0–100.0)
Monocytes Absolute: 1.1 10*3/uL — ABNORMAL HIGH (ref 0.1–1.0)
Monocytes Relative: 6 %
Neutro Abs: 15.1 10*3/uL — ABNORMAL HIGH (ref 1.7–7.7)
Neutrophils Relative %: 85 %
Platelets: 142 10*3/uL — ABNORMAL LOW (ref 150–400)
RBC: 3.81 MIL/uL — ABNORMAL LOW (ref 3.87–5.11)
RDW: 16.7 % — ABNORMAL HIGH (ref 11.5–15.5)
WBC: 17.9 10*3/uL — ABNORMAL HIGH (ref 4.0–10.5)
nRBC: 0 % (ref 0.0–0.2)

## 2019-04-30 LAB — MAGNESIUM
Magnesium: 2 mg/dL (ref 1.7–2.4)
Magnesium: 1.8 mg/dL (ref 1.7–2.4)
Magnesium: 1.7 mg/dL (ref 1.7–2.4)
Magnesium: 1.7 mg/dL (ref 1.7–2.4)

## 2019-04-30 LAB — GLUCOSE, CAPILLARY
Glucose-Capillary: 157 mg/dL — ABNORMAL HIGH (ref 70–99)
Glucose-Capillary: 159 mg/dL — ABNORMAL HIGH (ref 70–99)
Glucose-Capillary: 171 mg/dL — ABNORMAL HIGH (ref 70–99)
Glucose-Capillary: 172 mg/dL — ABNORMAL HIGH (ref 70–99)
Glucose-Capillary: 175 mg/dL — ABNORMAL HIGH (ref 70–99)
Glucose-Capillary: 185 mg/dL — ABNORMAL HIGH (ref 70–99)
Glucose-Capillary: 186 mg/dL — ABNORMAL HIGH (ref 70–99)

## 2019-04-30 MED ORDER — SODIUM CHLORIDE 0.9 % IV SOLN
2.0000 g | Freq: Two times a day (BID) | INTRAVENOUS | Status: DC
  Administered 2019-04-30: 2 g via INTRAVENOUS
  Filled 2019-04-30 (×3): qty 2

## 2019-04-30 MED ORDER — HEPARIN BOLUS VIA INFUSION
2700.0000 [IU] | Freq: Once | INTRAVENOUS | Status: AC
  Administered 2019-04-30: 2700 [IU] via INTRAVENOUS
  Filled 2019-04-30: qty 2700

## 2019-04-30 MED ORDER — HEPARIN SODIUM (PORCINE) 5000 UNIT/ML IJ SOLN
5000.0000 [IU] | Freq: Three times a day (TID) | INTRAMUSCULAR | Status: DC
  Administered 2019-04-30 – 2019-05-20 (×55): 5000 [IU] via SUBCUTANEOUS
  Filled 2019-04-30 (×54): qty 1

## 2019-04-30 MED ORDER — SODIUM CHLORIDE 0.9 % IV SOLN
2.0000 g | INTRAVENOUS | Status: DC
  Filled 2019-04-30: qty 2

## 2019-04-30 MED ORDER — FAMOTIDINE 20 MG PO TABS
20.0000 mg | ORAL_TABLET | Freq: Every day | ORAL | Status: DC
  Administered 2019-04-30 – 2019-05-06 (×7): 20 mg
  Filled 2019-04-30 (×7): qty 1

## 2019-04-30 NOTE — Progress Notes (Signed)
Established dialysis patient, known at [place] 5:45. Will continue to follow.

## 2019-04-30 NOTE — Progress Notes (Signed)
ANTICOAGULATION CONSULT NOTE - Initial Consult  Pharmacy Consult for Heparin Indication: chest pain/ACS  Allergies  Allergen Reactions  . Iodine Rash  . Enalapril Other (See Comments)    TONGUE SWELLS/GOUT  . Iodinated Diagnostic Agents Other (See Comments)    BREAK OUT IN WHELPS      Patient Measurements: Height: 5' 5.98" (167.6 cm) Weight: 291 lb 10.7 oz (132.3 kg) IBW/kg (Calculated) : 59.26 Heparin Dosing Weight: 92.1 kg  Vital Signs: Temp: 97.7 F (36.5 C) (05/27 2300) Temp Source: Rectal (05/27 1952) BP: 111/63 (05/27 2300) Pulse Rate: 87 (05/27 2300)  Labs: Recent Labs    04/04/2019 0731 04/04/2019 1451  04/22/2019 2139 04/04/2019 2356  04/29/19 0423 04/29/19 0522  04/29/19 1338 04/29/19 1854 04/29/19 2258  HGB 12.1 13.9  --   --   --   --  13.2  --   --   --   --   --   HCT 37.2 42.6  --   --   --   --  41.5  --   --   --   --   --   PLT 229 272  --   --   --   --  220  --   --   --   --   --   APTT  --   --   --   --   --   --   --  36  --   --   --   --   LABPROT 14.5  --   --   --   --   --   --   --   --   --   --   --   INR 1.1  --   --   --   --   --   --   --   --   --   --   --   HEPARINUNFRC  --   --   --   --  0.54  --   --   --   --  <0.10*  --  <0.10*  CREATININE 14.81* 13.69*   < > 9.81*  --    < > 7.47*  --    < > 6.84* 5.20* 4.87*  TROPONINI  --  0.53*  --  0.91*  --   --  0.66*  --   --   --   --   --    < > = values in this interval not displayed.    Estimated Creatinine Clearance: 16.3 mL/min (A) (by C-G formula based on SCr of 4.87 mg/dL (H)).   Medical History: Past Medical History:  Diagnosis Date  . Anemia   . Anxiety   . Arthritis   . Cancer [facility])    Left Kidney Cancer  . CHF (congestive heart failure) ([place])   . Chronic kidney disease   . COPD (chronic obstructive pulmonary disease) (HCC)    Use Oxygen at bedtime  . Coronary artery disease   . Diabetes mellitus without complication ([place])   . Dialysis patient ([place])    Tues,  Thurs, Sat  . Dyspnea    with exertion  . GERD (gastroesophageal reflux disease)   . Gout   . History of kidney stones   . Hypertension   . Neuropathy   . Peripheral vascular disease ([place])   . Sleep apnea    OSA--USE C-PAP  . Stroke [facility]) 2004    Assessment:  Patient is a 64yo female admitted post cardiac arrest. Patient is ESRD and missed several sessions of HD, hyperkalemia on admission with K of 7.2. Now with elevated troponin. Pharmacy consulted for Heparin drip dosing. Patient received Heparin 5000 units SQ for DVT prophylaxis, will discontinue this order. Will order Heparin 1200 units/hr IV to begin, no bolus was ordered.   Pt started on 1200 units/hr with no bolus.  5/26 @2356  HL 0.54 - continue current rate.  5/27 @0800  HL - not available reordered 5/27 @1338  HL <0.10 Heparin 2700 units IV bolus and increase rate to 1450 units/hr 5/27 @2258  HL < 0.10   Goal of Therapy:  Heparin level 0.3-0.7 units/ml Monitor platelets by anticoagulation protocol: Yes   Plan:  Heparin level subtherapeutic. Will order Heparin 2700 units IV bolus and increase rate to 1700 units/hr. Will check a HL in 8 hours. Daily CBC while on Heparin drip.  Eleonore Chiquito, PharmD, BCPS 04/30/2019 12:25 AM

## 2019-04-30 NOTE — Progress Notes (Signed)
Spoke to patient's husband and updated him.  I allowed him to speak to the patient with my ascom.

## 2019-04-30 NOTE — Progress Notes (Signed)
PHARMACY NOTE:  ANTIMICROBIAL RENAL DOSAGE ADJUSTMENT  Current antimicrobial regimen includes a mismatch between antimicrobial dosage and estimated renal function.  As per policy approved by the Pharmacy & Therapeutics and Medical Executive Committees, the antimicrobial dosage will be adjusted accordingly.  Current antimicrobial dosage:  Cefepime 2g q12H  Indication: HAP  Renal Function:  Estimated Creatinine Clearance: 15.5 mL/min (A) (by C-G formula based on SCr of 5.17 mg/dL (H)).    Antimicrobial dosage has been changed to:  Cefepime 2g q24H    Thank you for allowing pharmacy to be a part of this patient's care.  Oswald Hillock, [facility] 04/30/2019 7:09 AM

## 2019-04-30 NOTE — TOC Progression Note (Signed)
Transition of Care [facility]) - Progression Note    Patient Details  Name: [name] MRN: [id number] Date of Birth: February 11, 1956  Transition of Care [facility]) CM/SW Contact  Shelbie Hutching, RN Phone Number: 04/30/2019, 2:34 PM  Clinical Narrative:    Patient was admitted to the ICU from the ED after coding.  Patient was brought in to the ED after not having dialysis for 1 week because of clotted dialysis fistula.  Patient's potassium was elevated > 7 and coded while in the ED.  Patient transferred to the ICU and is currently intubated and sedated on CRRT.  Elvera Bicker is aware of patient admission.  RNCM will cont to monitor patient progress, patient remains in critical condition.         Expected Discharge Plan and Services                                                 Social Determinants of Health (SDOH) Interventions    Readmission Risk Interventions No flowsheet data found.

## 2019-04-30 NOTE — Progress Notes (Signed)
Pharmacy Antibiotic Note  [name] is a 63 y.o. female admitted on 04/05/2019 with pneumonia.  Pharmacy has been consulted for Vancomycin and Cefepime dosing. Patient is ESRD on HD on Tue/Thur/Sat schedule. Patient is now mechanically ventilated and has been initiated on CRRT.  Plan: Vancomycin discontinued  Continue Cefepime 2g IV q12h.  If changes to HD will need to adjust dosing.  Height: 5' 5.98" (167.6 cm) Weight: 294 lb 15.6 oz (133.8 kg) IBW/kg (Calculated) : 59.26  Temp (24hrs), Avg:97.6 F (36.4 C), Min:95.5 F (35.3 C), Max:98.2 F (36.8 C)  Recent Labs  Lab 04/03/2019 0731 04/04/2019 1451  04/29/19 0423  04/29/19 1338 04/29/19 1854 04/29/19 2258 04/30/19 0448 04/30/19 0500 04/30/19 0848  WBC 8.2 17.7*  --  25.5*  --   --   --   --   --  17.9*  --   CREATININE 14.81* 13.69*   < > 7.47*   < > 6.84* 5.20* 4.87* 5.17*  --  5.30*   < > = values in this interval not displayed.    Estimated Creatinine Clearance: 15.1 mL/min (A) (by C-G formula based on SCr of 5.3 mg/dL (H)).    Allergies  Allergen Reactions  . Iodine Rash  . Enalapril Other (See Comments)    TONGUE SWELLS/GOUT  . Iodinated Diagnostic Agents Other (See Comments)    BREAK OUT IN WHELPS      Antimicrobials this admission: Vancomycin  5/26 >> 5/27 Cefepime 5/26 >>   Thank you for allowing pharmacy to be a part of this patient's care.  Paulina Fusi, PharmD, BCPS 04/30/2019 2:32 PM

## 2019-04-30 NOTE — Progress Notes (Signed)
Assisted tele visit to patient with family member.  [place], Kristin Anderson, RN   

## 2019-04-30 NOTE — Progress Notes (Signed)
[facility]   Patient intubated. Pressors being weaned. CRRT. Permcath vs declot when patient is more stable.  Will continue to follow.  Discussed with Dr. Ellis Parents Stegmayer PA-C 04/30/2019 11:51 AM

## 2019-04-30 NOTE — Progress Notes (Signed)
Pt cont.on CRRT this shift until 330 machine filter had a clot reading high pressures. Pt lines flushed well crrt restarted at am.

## 2019-04-30 NOTE — Progress Notes (Signed)
Spoke to patient's husband and daughter- they set up a time with [facility] to see and talk to patient at 11:30.

## 2019-04-30 NOTE — Progress Notes (Signed)
[facility] [place] NAME: [name]    MR#:  [id number]  DATE OF BIRTH:  [birth year]  SUBJECTIVE: Seen at bedside, intubated, sedated.  On Levophed.  Critically ill.  CHIEF COMPLAINT:   Chief Complaint  Patient presents with  . Shortness of Breath    REVIEW OF SYSTEMS:   Review of Systems  Unable to perform ROS: Critical illness     DRUG ALLERGIES:   Allergies  Allergen Reactions  . Iodine Rash  . Enalapril Other (See Comments)    TONGUE SWELLS/GOUT  . Iodinated Diagnostic Agents Other (See Comments)    BREAK OUT IN WHELPS      VITALS:  Blood pressure (!) 94/58, pulse (!) 58, temperature (!) 95.5 F (35.3 C), resp. rate 13, height 5' 5.98" (1.676 m), weight 133.8 kg, SpO2 97 %.  PHYSICAL EXAMINATION:  GENERAL:  63 y.o.-year-old patient lying in the bed with no acute distress.  EYES: Pupils equal, round, reactive to light  No scleral icterus. Extraocular muscles intact.  HEENT: Head atraumatic, normocephalic.  Currently intubated, sedated.  Clear.  NECK:  Supple, no jugular venous distention. No thyroid enlargement, no tenderness.  LUNGS: Diminished breath sounds bilaterally. CARDIOVASCULAR: S1, S2 normal. No murmurs, rubs, or gallops.  ABDOMEN: Soft, nontender, nondistended. Bowel sounds present. No organomegaly or mass.  EXTREMITIES: No pedal edema, cyanosis, or clubbing.  NEUROLOGIC: Unable to do full neuro exam because of intubation, sedation. PSYCHIATRIC: Intubated, sedated.    SKIN: No obvious rash, lesion, or ulcer.    LABORATORY PANEL:   CBC Recent Labs  Lab 04/30/19 0500  WBC 17.9*  HGB 11.1*  HCT 34.9*  PLT 142*   ------------------------------------------------------------------------------------------------------------------  Chemistries  Recent Labs  Lab 04/19/2019 1451  04/30/19 0848  NA 137   < > 134*  K 4.7   < > 4.4  CL 99   < > 100  CO2 19*   < > 24  GLUCOSE 248*  246*   <  > 229*  BUN 90*   < > 51*  CREATININE 13.69*   < > 5.30*  CALCIUM 9.0   < > 7.8*  MG 2.2   < > 1.8  AST 32  --   --   ALT 41  --   --   ALKPHOS 218*  --   --   BILITOT 0.9  --   --    < > = values in this interval not displayed.   ------------------------------------------------------------------------------------------------------------------  Cardiac Enzymes Recent Labs  Lab 04/29/19 0423  TROPONINI 0.66*   ------------------------------------------------------------------------------------------------------------------  RADIOLOGY:  Dg Chest Port 1 View  Result Date: 04/29/2019 CLINICAL DATA:  Acute respiratory failure. EXAM: PORTABLE CHEST 1 VIEW COMPARISON:  Single-view of the chest earlier today and 04/26/2019. FINDINGS: Support tubes and lines are unchanged since the most recent comparison. There is cardiomegaly without edema. Bibasilar airspace disease has worsened on the right. No pneumothorax. There may be a right pleural effusion. IMPRESSION: No change in support apparatus. Bibasilar airspace disease is worse on the right. There may be a right pleural effusion. Cardiomegaly. Electronically Signed   By: Inge Rise M.D.   On: 04/29/2019 18:32   Dg Chest Port 1 View  Result Date: 04/29/2019 CLINICAL DATA:  Acute respiratory failure EXAM: PORTABLE CHEST 1 VIEW COMPARISON:  Yesterday FINDINGS: Endotracheal tube tip between the clavicular heads and carina. Left subclavian dialysis catheter with tip at the brachiocephalic SVC confluence. There is an orogastric  tube which at least reaches the stomach. Haziness of the bilateral lower chest with obscuration of the left diaphragm. No Kerley lines, effusion, or pneumothorax. Cardiomegaly.  Rightward rotation. IMPRESSION: 1. Stable hardware positioning. 2. Worsening lower lobe aeration. Electronically Signed   By: Monte Fantasia M.D.   On: 04/29/2019 06:50   Dg Chest Port 1 View  Result Date: 04/26/2019 CLINICAL DATA:  63 year old  female history intubation EXAM: PORTABLE CHEST 1 VIEW COMPARISON:  04/17/2019, 03/28/2015 FINDINGS: Cardiomediastinal silhouette unchanged in size and contour with low lung volumes accentuating the diameter of the mediastinum. Asymmetric elevation of the right hemidiaphragm. Coarsened interstitial markings with interlobular septal thickening and thickening of the minor fissure. Patchy opacity in the right lung base again demonstrated. Interval placement of endotracheal tube which terminates 2.5 cm above the carina. Interval placement of gastric tube terminating out of the field of view. Interval placement of defibrillator pads. Left upper extremity HERO graft, with the tip terminating in the region of the superior vena cava. IMPRESSION: Interval placement of endotracheal tube terminating 2.5 cm above the carina. Interval placement of gastric tube terminating in the abdomen out of the field of view. Interval placement of defibrillator pads. Similar appearance of low lung volumes, with background changes of either chronic scarring and/or early pulmonary edema, with similar appearance airspace disease at the right lung base. Left upper extremity HeRO graft Electronically Signed   By: Corrie Mckusick D.O.   On: 04/26/2019 13:18   Dg Abd Portable 1v  Result Date: 04/03/2019 CLINICAL DATA:  OG tube placement. EXAM: PORTABLE ABDOMEN - 1 VIEW COMPARISON:  One-view chest x-ray FINDINGS: OG tube terminates in the stomach. The stomach is mildly distended. The lung bases are clear. IMPRESSION: 1. OG tube terminates in the stomach. Electronically Signed   By: San Morelle M.D.   On: 04/04/2019 13:29    EKG:   Orders placed or performed during the hospital encounter of 04/15/2019  . EKG 12-Lead  . EKG 12-Lead    ASSESSMENT AND PLAN:   63 year old female patient with history of CVA, obstructive sleep apnea on CPAP, ESRD on hemodialysis Tuesday, Thursday, Saturday, congestive heart failure, COPD comes in because  of worsening shortness of breath, she was called in the emergency room, had PEA, intubated, admitted to intensive care unit.  1 .acute respiratory failure status post PEA, admitted to ICU, continue full vent support along with sedation, patient did not have further episodes of cardiac arrest in the ICU.  2.  Septic shock secondary to H CAP, continue cefepime, leukocytosis is improving, MRSA screen is negative.  3.  ESRD, on hemodialysis, now because of hypotension patient is on CRRT.  Seen by vascular for declotting of permacath when stable.  Continue CRRT.  Patient CRRT access is clotted, received heparin flushes.   All the records are reviewed and case discussed with Care Management/Social Workerr. Management plans discussed with the patient, family and they are in agreement.  CODE STATUS: full  TOTAL TIME TAKING CARE OF THIS PATIENT: 35 minutes.  More than 50% time spent in counseling, coordination of care. Unstable to plan for discharge patient at this time.   Epifanio Lesches M.D on 04/30/2019 at 12:55 PM  Between 7am to 6pm - Pager - [phone]  After 6pm go to [email or web address] - password [email or web address] [facility]  Office  [phone]  CC: Primary care physician; Smithson, [name], MD   Note: This dictation was prepared with Dragon dictation along with smaller phrase technology.  Any transcriptional errors that result from this process are unintentional.

## 2019-04-30 NOTE — Progress Notes (Signed)
CRITICAL CARE NOTE      SUBJECTIVE FINDINGS & SIGNIFICANT EVENTS   Patient remains critically ill, renal function is improving, patient remains on Levophed pressor support currently at 6 MCG Had updated husband Tommy Fedorchak yesterday Prognosis is guarded   PAST MEDICAL HISTORY   Past Medical History:  Diagnosis Date  . Anemia   . Anxiety   . Arthritis   . Cancer [facility])    Left Kidney Cancer  . CHF (congestive heart failure) ([place])   . Chronic kidney disease   . COPD (chronic obstructive pulmonary disease) ([facility])    Use Oxygen at bedtime  . Coronary artery disease   . Diabetes mellitus without complication ([place])   . Dialysis patient ([place])    Tues, Thurs, Sat  . Dyspnea    with exertion  . GERD (gastroesophageal reflux disease)   . Gout   . History of kidney stones   . Hypertension   . Neuropathy   . Peripheral vascular disease ([place])   . Sleep apnea    OSA--USE C-PAP  . Stroke [facility]) 2004     SURGICAL HISTORY   Past Surgical History:  Procedure Laterality Date  . A/V FISTULAGRAM Left 04/26/2017   Procedure: A/V Fistulagram;  Surgeon: Katha Cabal, MD;  Location: [place] [facility];  Service: Cardiovascular;  Laterality: Left;  . ABDOMINAL HYSTERECTOMY    . CARDIAC CATHETERIZATION    . DIALYSIS/PERMA CATHETER INSERTION N/A 01/31/2017   Procedure: Dialysis/Perma Catheter Insertion;  Surgeon: Algernon Huxley, MD;  Location: [place] [facility];  Service: Cardiovascular;  Laterality: N/A;  . DIALYSIS/PERMA CATHETER INSERTION N/A 02/21/2017   Procedure: Dialysis/Perma Catheter Insertion;  Surgeon: Algernon Huxley, MD;  Location: [place] [facility];  Service: Cardiovascular;  Laterality: N/A;  . DIALYSIS/PERMA CATHETER INSERTION N/A 03/19/2017   Procedure: Dialysis/Perma Catheter Insertion;  Surgeon:  Katha Cabal, MD;  Location: [place] [facility];  Service: Cardiovascular;  Laterality: N/A;  . DIALYSIS/PERMA CATHETER REMOVAL N/A 07/19/2017   Procedure: DIALYSIS/PERMA CATHETER REMOVAL;  Surgeon: Katha Cabal, MD;  Location: [place] [facility];  Service: Cardiovascular;  Laterality: N/A;  . EYE SURGERY Bilateral    Cataract Extraction with IOL  . KIDNEY SURGERY Left    Partial Nephrectomy  . LEFT HEART CATH AND CORONARY ANGIOGRAPHY N/A 04/05/2017   Procedure: Left Heart Cath and Coronary Angiography;  Surgeon: Wellington Hampshire, MD;  Location: [place] [facility];  Service: Cardiovascular;  Laterality: N/A;  . PERIPHERAL VASCULAR CATHETERIZATION N/A 01/23/2016   Procedure: Dialysis/Perma Catheter Insertion;  Surgeon: Algernon Huxley, MD;  Location: [place] [facility];  Service: Cardiovascular;  Laterality: N/A;  . PERIPHERAL VASCULAR CATHETERIZATION Left 10/01/2016   Procedure: A/V Shuntogram/Fistulagram;  Surgeon: Algernon Huxley, MD;  Location: [place] [facility];  Service: Cardiovascular;  Laterality: Left;  . PERIPHERAL VASCULAR THROMBECTOMY Left 11/24/2018   Procedure: PERIPHERAL VASCULAR THROMBECTOMY;  Surgeon: Algernon Huxley, MD;  Location: [place] [facility];  Service: Cardiovascular;  Laterality: Left;  . UPPER EXTREMITY VENOGRAPHY Bilateral 04/26/2017   Procedure: Upper Extremity Venography;  Surgeon: Katha Cabal, MD;  Location: [place] [facility];  Service: Cardiovascular;  Laterality: Bilateral;  . VASCULAR ACCESS DEVICE INSERTION Left 05/22/2017   Procedure: INSERTION OF HERO VASCULAR ACCESS DEVICE;  Surgeon: Katha Cabal, MD;  Location: [facility];  Service: Vascular;  Laterality: Left;     FAMILY HISTORY   Family History  Problem Relation Age of Onset  .  Hypertension Mother   . Heart failure Mother   . Diabetes Father   . Heart attack Father   . Lung cancer Maternal Aunt   . Cancer Maternal Aunt      SOCIAL HISTORY   Social History   Tobacco  Use  . Smoking status: Former Research scientist (life sciences)  . Smokeless tobacco: Never Used  Substance Use Topics  . Alcohol use: No  . Drug use: No     MEDICATIONS   Current Medication:  Current Facility-Administered Medications:  .  albuterol (PROVENTIL) (2.5 MG/3ML) 0.083% nebulizer solution 2.5 mg, 2.5 mg, Nebulization, Q6H PRN, Manuella Ghazi, Vipul, MD .  allopurinol (ZYLOPRIM) tablet 150 mg, 150 mg, Per Tube, Once per day on Tue Thu Sat, Shah, Vipul, MD .  amitriptyline (ELAVIL) tablet 25 mg, 25 mg, Per Tube, QHS, Max Sane, MD, 25 mg at 04/29/19 2320 .  atorvastatin (LIPITOR) tablet 40 mg, 40 mg, Per Tube, Daily, Max Sane, MD, 40 mg at 04/29/19 2319 .  calcium carbonate (TUMS - dosed in mg elemental calcium) chewable tablet 200 mg of elemental calcium, 1 tablet, Oral, PRN, Ouma, Bing Neighbors, NP .  ceFEPIme (MAXIPIME) 2 g in sodium chloride 0.9 % 100 mL IVPB, 2 g, Intravenous, Q24H, Konidena, Snehalatha, MD .  chlorhexidine gluconate (MEDLINE KIT) (PERIDEX) 0.12 % solution 15 mL, 15 mL, Mouth Rinse, BID, Lanney Gins, Fuad, MD, 15 mL at 04/29/19 2007 .  Chlorhexidine Gluconate Cloth 2 % PADS 6 each, 6 each, Topical, Daily, Max Sane, MD, 6 each at 04/29/19 [phone] .  citalopram (CELEXA) tablet 40 mg, 40 mg, Per Tube, Daily, Max Sane, MD, 40 mg at 04/29/19 0951 .  clopidogrel (PLAVIX) tablet 75 mg, 75 mg, Per Tube, Daily, Max Sane, MD, 75 mg at 04/29/19 0951 .  dexmedetomidine (PRECEDEX) 400 MCG/100ML (4 mcg/mL) infusion, 0.4-1.2 mcg/kg/hr, Intravenous, Titrated, Aleskerov, Fuad, MD, Last Rate: 31.3 mL/hr at 04/30/19 0346, 1 mcg/kg/hr at 04/30/19 0346 .  docusate (COLACE) 50 MG/5ML liquid 200 mg, 200 mg, Per Tube, BID, Vira Blanco, RPH, 200 mg at 04/29/19 2319 .  feeding supplement (PRO-STAT SUGAR FREE 64) liquid 60 mL, 60 mL, Per Tube, QID, Aleskerov, Fuad, MD, 60 mL at 04/29/19 2321 .  feeding supplement (VITAL HIGH PROTEIN) liquid 1,000 mL, 1,000 mL, Per Tube, Q24H, Aleskerov, Fuad, MD, 1,000 mL  at 04/29/19 1331 .  fentaNYL (SUBLIMAZE) injection 50 mcg, 50 mcg, Intravenous, Q15 min PRN, Awilda Bill, NP .  fentaNYL (SUBLIMAZE) injection 50-200 mcg, 50-200 mcg, Intravenous, Q30 min PRN, Awilda Bill, NP, 100 mcg at 04/29/19 1747 .  fluticasone (FLONASE) 50 MCG/ACT nasal spray 2 spray, 2 spray, Each Nare, Daily, Ouma, Bing Neighbors, NP .  heparin ADULT infusion 100 units/mL (25000 units/250m sodium chloride 0.45%), 1,700 Units/hr, Intravenous, Continuous, Konidena, Snehalatha, MD, Last Rate: 17 mL/hr at 04/30/19 0509, 1,700 Units/hr at 04/30/19 0509 .  heparin injection 1,000-6,000 Units, 1,000-6,000 Units, CRRT, PRN, Kolluru, Sarath, MD .  insulin aspart (novoLOG) injection 0-9 Units, 0-9 Units, Subcutaneous, Q4H, AOttie Glazier MD, 2 Units at 04/30/19 0345 .  lidocaine-prilocaine (EMLA) cream 1 application, 1 application, Topical, PRN, Ouma, EBing Neighbors NP .  loratadine (CLARITIN) tablet 10 mg, 10 mg, Per Tube, QHS, KVira Blanco RPH, 10 mg at 04/29/19 2319 .  MEDLINE mouth rinse, 15 mL, Mouth Rinse, 10 times per day, AOttie Glazier MD, 15 mL at 04/30/19 0551 .  multivitamin liquid 15 mL, 15 mL, Per Tube, Daily, ALanney Gins Fuad, MD, 15 mL at 04/29/19 1400 .  norepinephrine (LEVOPHED) 16 mg in dextrose 5 % 250 mL (0.064 mg/mL) infusion, 0-40 mcg/min, Intravenous, Titrated, Aleskerov, Fuad, MD, Last Rate: 5.63 mL/hr at 04/30/19 0335, 6 mcg/min at 04/30/19 0335 .  nystatin (MYCOSTATIN/NYSTOP) topical powder 1 g, 1 g, Topical, Daily PRN, Ouma, Bing Neighbors, NP .  polyethylene glycol powder (GLYCOLAX/MIRALAX) container 17 g, 17 g, Oral, TID PRN, Ouma, Bing Neighbors, NP .  polyvinyl alcohol (LIQUIFILM TEARS) 1.4 % ophthalmic solution 1-2 drop, 1-2 drop, Both Eyes, QID PRN, Lang Snow, NP .  pureflow IV solution for Dialysis, , CRRT, Continuous, Kolluru, Sarath, MD, Last Rate: 2,000 mL/hr at 04/29/19 2341, 3 each at 04/29/19 2341 .  sevelamer  carbonate (RENVELA) tablet 1,600 mg, 1,600 mg, Per Tube, TID WC, Vira Blanco, RPH, 1,600 mg at 04/29/19 1800 .  sodium chloride flush (NS) 0.9 % injection 10-40 mL, 10-40 mL, Intracatheter, Q12H, Lanney Gins, Fuad, MD, 10 mL at 04/29/19 2322 .  sodium chloride flush (NS) 0.9 % injection 10-40 mL, 10-40 mL, Intracatheter, PRN, Ottie Glazier, MD    ALLERGIES   Iodine; Enalapril; and Iodinated diagnostic agents    REVIEW OF SYSTEMS    Unable to obtain due to mechanical ventilation and intubation  PHYSICAL EXAMINATION   Vitals:   04/29/19 2300 04/30/19 0238  BP: 111/63   Pulse: 87   Resp: 19   Temp: 97.7 F (36.5 C)   SpO2: 95% 95%    GENERAL: Sedated on mechanical ventilation HEAD: Normocephalic, atraumatic.  EYES: Pupils equal, round, reactive to light.  No scleral icterus.  MOUTH: Moist mucosal membrane. NECK: Supple. No thyromegaly. No nodules. No JVD.  PULMONARY: Bibasilar crackles CARDIOVASCULAR: S1 and S2. Regular rate and rhythm. No murmurs, rubs, or gallops.  GASTROINTESTINAL: Soft, nontender, non-distended. No masses. Positive bowel sounds. No hepatosplenomegaly.  MUSCULOSKELETAL: No swelling, clubbing, or edema.  NEUROLOGIC: Mild distress due to acute illness SKIN:intact,warm,dry   LABS AND IMAGING     LAB RESULTS: Recent Labs  Lab 04/29/19 1854 04/29/19 2258 04/30/19 0448  NA 136 134* 134*  K 4.6 4.3 4.4  CL 101 100 100  CO2 '24 22 23  ' BUN 44* 41* 50*  CREATININE 5.20* 4.87* 5.17*  GLUCOSE 183* 196* 229*   Recent Labs  Lab 05/02/2019 1451 04/29/19 0423 04/30/19 0500  HGB 13.9 13.2 11.1*  HCT 42.6 41.5 34.9*  WBC 17.7* 25.5* 17.9*  PLT 272 220 142*     IMAGING RESULTS: Dg Chest Port 1 View  Result Date: 04/29/2019 CLINICAL DATA:  Acute respiratory failure. EXAM: PORTABLE CHEST 1 VIEW COMPARISON:  Single-view of the chest earlier today and 04/13/2019. FINDINGS: Support tubes and lines are unchanged since the most recent comparison.  There is cardiomegaly without edema. Bibasilar airspace disease has worsened on the right. No pneumothorax. There may be a right pleural effusion. IMPRESSION: No change in support apparatus. Bibasilar airspace disease is worse on the right. There may be a right pleural effusion. Cardiomegaly. Electronically Signed   By: Inge Rise M.D.   On: 04/29/2019 18:32      ASSESSMENT AND PLAN   -Multidisciplinary rounds held today   Acutecomatose state    -mental status improved now - patient able to follow verbal communication inconsistently -Status post endotracheal intubation -continue Full MV support -continue Bronchodilator Therapy -Wean Fio2 and PEEP as tolerated -will perform SAT/SBT when respiratory parameters are met -Discussed with Husband Tommy Poulsen- he is very thankful wife survived and improved now    Severe hyperkalemia - resolved  S/p dialysis via CRRT - HCO3-, insulin, Veltasa, Calcium gluconate ICU monitoring Nephrology on case- appreciate input    NSTEMI -Status post cardiac arrest and ACLS -Trending troponin 0 0.53- trending down 0.91-0.66 -Empirically initiating heparin drip-patient continues to have clotting issues with dialysis catheter despite therapeutic heparin    Renal Failure-Renal failure ESRD - starting CRRT - L trialysis cath nephro on case - appreciate input -follow chem 7 -follow UO -continue Foley Catheter-assess need daily -did not take off fluid    NEUROLOGY - intubated and sedated - minimal sedation to achieve a RASS goal: -1 Wake up assessment pending    ID -continue IV abx as prescibed -follow up cultures  GI/Nutrition GI PROPHYLAXIS as indicated DIET-->TF's as tolerated Constipation protocol as indicated  ENDO - ICU hypoglycemic\Hyperglycemia protocol -check FSBS per protocol   ELECTROLYTES -follow labs as needed -replace as needed -pharmacy consultation   DVT/GI  PRX ordered -SCDs  TRANSFUSIONS AS NEEDED MONITOR FSBS ASSESS the need for LABS as needed   Critical care provider statement:  Critical care time (minutes):31 Critical care time was exclusive of: Separately billable procedures and treating other patients Critical care was necessary to treat or prevent imminent or life-threatening deterioration of the following conditions:Circulatory shock, cardiac arrest, acute comatose state, severe hyperkalemia, AV graft fistula malfunction, fluid overload, diabetes, OSA, multiple comorbid conditions Critical care was time spent personally by me on the following activities: Development of treatment plan with patient or surrogate, discussions with consultants, evaluation of patient's response to treatment, examination of patient, obtaining history from patient or surrogate, ordering and performing treatments and interventions, ordering and review of laboratory studies and re-evaluation of patient's condition. I assumed direction of critical care for this patient from another provider in my specialty: no   This document was prepared using Dragon voice recognition software and may include unintentional dictation errors.    Ottie Glazier, M.D.  Division of [place]

## 2019-04-30 NOTE — Progress Notes (Signed)
Attempted to contact pts husband [name] via telephone to update him regarding his wife'ss plan of care and pt condition.  However, Mr. Berch did not answer the phone.  Will continue monitor and assess pt.  [name], [place] Pager [phone] (please enter 7 digits) PCCM Consult Pager [phone] (please enter 7 digits)

## 2019-04-30 NOTE — Progress Notes (Signed)
[facility]  ROUNDING NOTE   Subjective:   CRRT 2K bath   Norepinephrine gtt   Objective:  Vital signs in last 24 hours:  Temp:  [96.6 F (35.9 C)-98.2 F (36.8 C)] 96.6 F (35.9 C) (05/28 0800) Pulse Rate:  [75-90] 78 (05/28 0800) Resp:  [13-31] 18 (05/28 0800) BP: (62-162)/(34-93) 121/69 (05/28 0700) SpO2:  [87 %-99 %] 95 % (05/28 0800) FiO2 (%):  [30 %-100 %] 70 % (05/28 0238) Weight:  [133.8 kg] 133.8 kg (05/28 0500)  Weight change: 8.607 kg Filed Weights   04/17/2019 1150 04/29/19 0500 04/30/19 0500  Weight: 134.2 kg 132.3 kg 133.8 kg    Intake/Output: I/O last 3 completed shifts: In: 1786.5 [I.V.:1464.5; NG/GT:139.7; IV Piggyback:182.4] Out: 427 [Other:427]   Intake/Output this shift:  Total I/O In: 856.6 [I.V.:576.2; NG/GT:280.3] Out: 24 [Other:24]  Physical Exam: General: Critically ill   Head: ETT, OGT  Eyes: Eyes closed  Neck:   trachea midline  Lungs:  PRVC 70%  Heart: Regular rate and rhythm  Abdomen:  Soft, nontender, obese  Extremities:  + peripheral edema.  Neurologic: Intubated and sedated  Skin: No lesions  Access: +thrombosed left arm AVG, temp femoral right HD catheter Dr. Lanney Gins    Basic Metabolic Panel: Recent Labs  Lab 04/29/19 0837 04/29/19 1338 04/29/19 1854 04/29/19 2258 04/30/19 0448  NA 134* 136 136 134* 134*  K 4.7 5.1 4.6 4.3 4.4  CL 98 98 101 100 100  CO2 _0 GLUCOSE 151* 125* 183* 196* 229*  BUN 50* 47* 44* 41* 50*  CREATININE 6.69* 6.84* 5.20* 4.87* 5.17*  CALCIUM 8.1* 8.0* 8.0* 8.0* 7.9*  MG 1.8 2.1 1.9 1.8 2.0  PHOS 4.9* 4.8* 4.3 4.3 4.8*    Liver Function Tests: Recent Labs  Lab 04/14/2019 0731 04/30/2019 1451  04/29/19 0837 04/29/19 1338 04/29/19 1854 04/29/19 2258 04/30/19 0448  AST 13* 32  --   --   --   --   --   --   ALT 16 41  --   --   --   --   --   --   ALKPHOS 206* 218*  --   --   --   --   --   --   BILITOT 0.9 0.9  --   --   --   --   --   --   PROT 7.7 7.1  --    --   --   --   --   --   ALBUMIN 3.6 3.3*   < > 2.8* 2.9* 2.7* 2.6* 2.6*   < > = values in this interval not displayed.   Recent Labs  Lab 04/27/2019 0731  LIPASE 59*   No results for input(s): AMMONIA in the last 168 hours.  CBC: Recent Labs  Lab 04/11/2019 0731 04/20/2019 1451 04/29/19 0423 04/30/19 0500  WBC 8.2 17.7* 25.5* 17.9*  NEUTROABS 6.0 14.7*  --  15.1*  HGB 12.1 13.9 13.2 11.1*  HCT 37.2 42.6 41.5 34.9*  MCV 90.1 90.3 90.6 91.6  PLT 229 272 220 142*    Cardiac Enzymes: Recent Labs  Lab 04/03/2019 1451 04/17/2019 2139 04/29/19 0423  TROPONINI 0.53* 0.91* 0.66*    BNP: Invalid input(s): POCBNP  CBG: Recent Labs  Lab 04/29/19 2006 04/29/19 2250 04/29/19 2334 04/30/19 0337 04/30/19 0739  GLUCAP 134* 139* 172* 186* 171*    Microbiology: Results for orders placed or performed during the hospital encounter  of 04/12/2019  SARS Coronavirus 2 (CEPHEID - Performed in [place] hospital lab), Hosp Order     Status: None   Collection Time: 04/25/2019  7:31 AM  Result Value Ref Range Status   SARS Coronavirus 2 NEGATIVE NEGATIVE Final    Comment: (NOTE) If result is NEGATIVE SARS-CoV-2 target nucleic acids are NOT DETECTED. The SARS-CoV-2 RNA is generally detectable in upper and lower  respiratory specimens during the acute phase of infection. The lowest  concentration of SARS-CoV-2 viral copies this assay can detect is 250  copies / mL. A negative result does not preclude SARS-CoV-2 infection  and should not be used as the sole basis for treatment or other  patient management decisions.  A negative result may occur with  improper specimen collection / handling, submission of specimen other  than nasopharyngeal swab, presence of viral mutation(s) within the  areas targeted by this assay, and inadequate number of viral copies  (<250 copies / mL). A negative result must be combined with clinical  observations, patient history, and epidemiological information. If  result is POSITIVE SARS-CoV-2 target nucleic acids are DETECTED. The SARS-CoV-2 RNA is generally detectable in upper and lower  respiratory specimens dur ing the acute phase of infection.  Positive  results are indicative of active infection with SARS-CoV-2.  Clinical  correlation with patient history and other diagnostic information is  necessary to determine patient infection status.  Positive results do  not rule out bacterial infection or co-infection with other viruses. If result is PRESUMPTIVE POSTIVE SARS-CoV-2 nucleic acids MAY BE PRESENT.   A presumptive positive result was obtained on the submitted specimen  and confirmed on repeat testing.  While 2019 novel coronavirus  (SARS-CoV-2) nucleic acids may be present in the submitted sample  additional confirmatory testing may be necessary for epidemiological  and / or clinical management purposes  to differentiate between  SARS-CoV-2 and other Sarbecovirus currently known to infect humans.  If clinically indicated additional testing with an alternate test  methodology [phone]) is advised. The SARS-CoV-2 RNA is generally  detectable in upper and lower respiratory sp ecimens during the acute  phase of infection. The expected result is Negative. Fact Sheet for Patients:  StrictlyIdeas.no Fact Sheet for Healthcare Providers: BankingDealers.co.za This test is not yet approved or cleared by the [place] FDA and has been authorized for detection and/or diagnosis of SARS-CoV-2 by FDA under an Emergency Use Authorization (EUA).  This EUA will remain in effect (meaning this test can be used) for the duration of the COVID-19 declaration under Section 564(b)(1) of the Act, 21 U.S.C. section 360bbb-3(b)(1), unless the authorization is terminated or revoked sooner. Performed at [facility], [address] [place], [place] [address]   MRSA PCR Screening     Status: None    Collection Time: 04/04/2019  9:04 AM  Result Value Ref Range Status   MRSA by PCR NEGATIVE NEGATIVE Final    Comment:        The GeneXpert MRSA Assay (FDA approved for NASAL specimens only), is one component of a comprehensive MRSA colonization surveillance program. It is not intended to diagnose MRSA infection nor to guide or monitor treatment for MRSA infections. Performed at [facility], [address] [place], [place] [address]     Coagulation Studies: Recent Labs    04/06/2019 0731  LABPROT 14.5  INR 1.1    Urinalysis: No results for input(s): COLORURINE, LABSPEC, PHURINE, GLUCOSEU, HGBUR, BILIRUBINUR, KETONESUR, PROTEINUR, UROBILINOGEN, NITRITE, LEUKOCYTESUR in  the last 72 hours.  Invalid input(s): APPERANCEUR    Imaging: Dg Chest Port 1 View  Result Date: 04/29/2019 CLINICAL DATA:  Acute respiratory failure. EXAM: PORTABLE CHEST 1 VIEW COMPARISON:  Single-view of the chest earlier today and 04/27/2019. FINDINGS: Support tubes and lines are unchanged since the most recent comparison. There is cardiomegaly without edema. Bibasilar airspace disease has worsened on the right. No pneumothorax. There may be a right pleural effusion. IMPRESSION: No change in support apparatus. Bibasilar airspace disease is worse on the right. There may be a right pleural effusion. Cardiomegaly. Electronically Signed   By: Inge Rise M.D.   On: 04/29/2019 18:32   Dg Chest Port 1 View  Result Date: 04/29/2019 CLINICAL DATA:  Acute respiratory failure EXAM: PORTABLE CHEST 1 VIEW COMPARISON:  Yesterday FINDINGS: Endotracheal tube tip between the clavicular heads and carina. Left subclavian dialysis catheter with tip at the brachiocephalic SVC confluence. There is an orogastric tube which at least reaches the stomach. Haziness of the bilateral lower chest with obscuration of the left diaphragm. No Kerley lines, effusion, or pneumothorax. Cardiomegaly.  Rightward rotation. IMPRESSION: 1.  Stable hardware positioning. 2. Worsening lower lobe aeration. Electronically Signed   By: Monte Fantasia M.D.   On: 04/29/2019 06:50   Dg Chest Port 1 View  Result Date: 04/10/2019 CLINICAL DATA:  63 year old female history intubation EXAM: PORTABLE CHEST 1 VIEW COMPARISON:  04/22/2019, 03/28/2015 FINDINGS: Cardiomediastinal silhouette unchanged in size and contour with low lung volumes accentuating the diameter of the mediastinum. Asymmetric elevation of the right hemidiaphragm. Coarsened interstitial markings with interlobular septal thickening and thickening of the minor fissure. Patchy opacity in the right lung base again demonstrated. Interval placement of endotracheal tube which terminates 2.5 cm above the carina. Interval placement of gastric tube terminating out of the field of view. Interval placement of defibrillator pads. Left upper extremity HERO graft, with the tip terminating in the region of the superior vena cava. IMPRESSION: Interval placement of endotracheal tube terminating 2.5 cm above the carina. Interval placement of gastric tube terminating in the abdomen out of the field of view. Interval placement of defibrillator pads. Similar appearance of low lung volumes, with background changes of either chronic scarring and/or early pulmonary edema, with similar appearance airspace disease at the right lung base. Left upper extremity HeRO graft Electronically Signed   By: Corrie Mckusick D.O.   On: 04/23/2019 13:18   Dg Abd Portable 1v  Result Date: 04/26/2019 CLINICAL DATA:  OG tube placement. EXAM: PORTABLE ABDOMEN - 1 VIEW COMPARISON:  One-view chest x-ray FINDINGS: OG tube terminates in the stomach. The stomach is mildly distended. The lung bases are clear. IMPRESSION: 1. OG tube terminates in the stomach. Electronically Signed   By: San Morelle M.D.   On: 04/15/2019 13:29     Medications:   . ceFEPime (MAXIPIME) IV    . dexmedetomidine (PRECEDEX) IV infusion 1 mcg/kg/hr  (04/30/19 0732)  . heparin 1,700 Units/hr (04/30/19 0509)  . norepinephrine (LEVOPHED) Adult infusion 6 mcg/min (04/30/19 0335)  . pureflow 3 each (04/29/19 2341)   . allopurinol  150 mg Per Tube Once per day on Tue Thu Sat  . amitriptyline  25 mg Per Tube QHS  . atorvastatin  40 mg Per Tube Daily  . chlorhexidine gluconate (MEDLINE KIT)  15 mL Mouth Rinse BID  . Chlorhexidine Gluconate Cloth  6 each Topical Daily  . citalopram  40 mg Per Tube Daily  . clopidogrel  75 mg Per Tube  Daily  . docusate  200 mg Per Tube BID  . feeding supplement (PRO-STAT SUGAR FREE 64)  60 mL Per Tube QID  . feeding supplement (VITAL HIGH PROTEIN)  1,000 mL Per Tube Q24H  . fluticasone  2 spray Each Nare Daily  . insulin aspart  0-9 Units Subcutaneous Q4H  . loratadine  10 mg Per Tube QHS  . mouth rinse  15 mL Mouth Rinse 10 times per day  . multivitamin  15 mL Per Tube Daily  . sodium chloride flush  10-40 mL Intracatheter Q12H   albuterol, calcium carbonate, fentaNYL (SUBLIMAZE) injection, fentaNYL (SUBLIMAZE) injection, heparin, lidocaine-prilocaine, nystatin, polyethylene glycol powder, polyvinyl alcohol, sodium chloride flush  Assessment/ Plan:  Ms. [name] is a 63 y.o. black female with end stage renal disease on hemodialysis, hypertension, CVA, obstructive sleep apnea, peripheral vascular disease, hypertension, gout, GERD, diabetes mellitus type II, COPD, coronary artery disease, congestive heart failure, who  was admitted to [facility] on 04/26/2019 for pneumonia. Acute respiratory failure requiring mechanical ventilation and vasopressors.   TTS [facility] left AVG   1. End stage renal disease with hyperkalemia Complication of dialysis device, clotted AVG. Temp HD catheter placed by ICU - Continue Continuous renal replacement therapy: CVVHD 2K bath.  Hold UF until more hemodynamically stable - Appreciate vascular input. Thrombectomy once patient is stable.   2. Anemia of  chronic kidney disease: holding epo. Hemoglobin 11.1   3. Sepsis/Hypotension: weaning off vsopressors. Leukocytosis improved from yesterday.  - Empiric antibiotics: cefepime.   LOS: 2 Sarath Kolluru 5/28/20208:24 AM

## 2019-05-01 ENCOUNTER — Inpatient Hospital Stay: Payer: Medicare Other

## 2019-05-01 LAB — RENAL FUNCTION PANEL
Albumin: 2.3 g/dL — ABNORMAL LOW (ref 3.5–5.0)
Albumin: 2.2 g/dL — ABNORMAL LOW (ref 3.5–5.0)
Albumin: 2.2 g/dL — ABNORMAL LOW (ref 3.5–5.0)
Albumin: 2.1 g/dL — ABNORMAL LOW (ref 3.5–5.0)
Albumin: 2.1 g/dL — ABNORMAL LOW (ref 3.5–5.0)
Albumin: 2.2 g/dL — ABNORMAL LOW (ref 3.5–5.0)
Anion gap: 8 (ref 5–15)
Anion gap: 5 (ref 5–15)
Anion gap: 9 (ref 5–15)
Anion gap: 5 (ref 5–15)
Anion gap: 6 (ref 5–15)
Anion gap: 6 (ref 5–15)
BUN: 41 mg/dL — ABNORMAL HIGH (ref 8–23)
BUN: 42 mg/dL — ABNORMAL HIGH (ref 8–23)
BUN: 41 mg/dL — ABNORMAL HIGH (ref 8–23)
BUN: 36 mg/dL — ABNORMAL HIGH (ref 8–23)
BUN: 36 mg/dL — ABNORMAL HIGH (ref 8–23)
BUN: 39 mg/dL — ABNORMAL HIGH (ref 8–23)
CO2: 26 mmol/L (ref 22–32)
CO2: 29 mmol/L (ref 22–32)
CO2: 28 mmol/L (ref 22–32)
CO2: 26 mmol/L (ref 22–32)
CO2: 26 mmol/L (ref 22–32)
CO2: 27 mmol/L (ref 22–32)
Calcium: 7.7 mg/dL — ABNORMAL LOW (ref 8.9–10.3)
Calcium: 7.6 mg/dL — ABNORMAL LOW (ref 8.9–10.3)
Calcium: 8 mg/dL — ABNORMAL LOW (ref 8.9–10.3)
Calcium: 7.3 mg/dL — ABNORMAL LOW (ref 8.9–10.3)
Calcium: 7.6 mg/dL — ABNORMAL LOW (ref 8.9–10.3)
Calcium: 8 mg/dL — ABNORMAL LOW (ref 8.9–10.3)
Chloride: 101 mmol/L (ref 98–111)
Chloride: 101 mmol/L (ref 98–111)
Chloride: 101 mmol/L (ref 98–111)
Chloride: 104 mmol/L (ref 98–111)
Chloride: 103 mmol/L (ref 98–111)
Chloride: 102 mmol/L (ref 98–111)
Creatinine, Ser: 3.09 mg/dL — ABNORMAL HIGH (ref 0.44–1.00)
Creatinine, Ser: 3 mg/dL — ABNORMAL HIGH (ref 0.44–1.00)
Creatinine, Ser: 2.84 mg/dL — ABNORMAL HIGH (ref 0.44–1.00)
Creatinine, Ser: 2.27 mg/dL — ABNORMAL HIGH (ref 0.44–1.00)
Creatinine, Ser: 2.2 mg/dL — ABNORMAL HIGH (ref 0.44–1.00)
Creatinine, Ser: 2.19 mg/dL — ABNORMAL HIGH (ref 0.44–1.00)
GFR calc Af Amer: 18 mL/min — ABNORMAL LOW (ref >60)
GFR calc Af Amer: 18 mL/min — ABNORMAL LOW (ref >60)
GFR calc Af Amer: 20 mL/min — ABNORMAL LOW (ref >60)
GFR calc Af Amer: 26 mL/min — ABNORMAL LOW (ref >60)
GFR calc Af Amer: 27 mL/min — ABNORMAL LOW (ref >60)
GFR calc Af Amer: 27 mL/min — ABNORMAL LOW (ref >60)
GFR calc non Af Amer: 15 mL/min — ABNORMAL LOW (ref >60)
GFR calc non Af Amer: 16 mL/min — ABNORMAL LOW (ref >60)
GFR calc non Af Amer: 17 mL/min — ABNORMAL LOW (ref >60)
GFR calc non Af Amer: 22 mL/min — ABNORMAL LOW (ref >60)
GFR calc non Af Amer: 23 mL/min — ABNORMAL LOW (ref >60)
GFR calc non Af Amer: 23 mL/min — ABNORMAL LOW (ref >60)
Glucose, Bld: 198 mg/dL — ABNORMAL HIGH (ref 70–99)
Glucose, Bld: 176 mg/dL — ABNORMAL HIGH (ref 70–99)
Glucose, Bld: 180 mg/dL — ABNORMAL HIGH (ref 70–99)
Glucose, Bld: 202 mg/dL — ABNORMAL HIGH (ref 70–99)
Glucose, Bld: 202 mg/dL — ABNORMAL HIGH (ref 70–99)
Glucose, Bld: 204 mg/dL — ABNORMAL HIGH (ref 70–99)
Phosphorus: 2.7 mg/dL (ref 2.5–4.6)
Phosphorus: 2.4 mg/dL — ABNORMAL LOW (ref 2.5–4.6)
Phosphorus: 2.4 mg/dL — ABNORMAL LOW (ref 2.5–4.6)
Phosphorus: 1.8 mg/dL — ABNORMAL LOW (ref 2.5–4.6)
Phosphorus: 2.4 mg/dL — ABNORMAL LOW (ref 2.5–4.6)
Phosphorus: 2.6 mg/dL (ref 2.5–4.6)
Potassium: 3.6 mmol/L (ref 3.5–5.1)
Potassium: 3.4 mmol/L — ABNORMAL LOW (ref 3.5–5.1)
Potassium: 3.4 mmol/L — ABNORMAL LOW (ref 3.5–5.1)
Potassium: 3.1 mmol/L — ABNORMAL LOW (ref 3.5–5.1)
Potassium: 3.4 mmol/L — ABNORMAL LOW (ref 3.5–5.1)
Potassium: 3.7 mmol/L (ref 3.5–5.1)
Sodium: 135 mmol/L (ref 135–145)
Sodium: 135 mmol/L (ref 135–145)
Sodium: 138 mmol/L (ref 135–145)
Sodium: 135 mmol/L (ref 135–145)
Sodium: 135 mmol/L (ref 135–145)
Sodium: 135 mmol/L (ref 135–145)

## 2019-05-01 LAB — CBC WITH DIFFERENTIAL/PLATELET
Abs Immature Granulocytes: 0.28 10*3/uL — ABNORMAL HIGH (ref 0.00–0.07)
Basophils Absolute: 0.1 10*3/uL (ref 0.0–0.1)
Basophils Relative: 1 %
Eosinophils Absolute: 0.1 10*3/uL (ref 0.0–0.5)
Eosinophils Relative: 1 %
HCT: 29.9 % — ABNORMAL LOW (ref 36.0–46.0)
Hemoglobin: 9.4 g/dL — ABNORMAL LOW (ref 12.0–15.0)
Immature Granulocytes: 2 %
Lymphocytes Relative: 15 %
Lymphs Abs: 1.9 10*3/uL (ref 0.7–4.0)
MCH: 29.2 pg (ref 26.0–34.0)
MCHC: 31.4 g/dL (ref 30.0–36.0)
MCV: 92.9 fL (ref 80.0–100.0)
Monocytes Absolute: 1.1 10*3/uL — ABNORMAL HIGH (ref 0.1–1.0)
Monocytes Relative: 8 %
Neutro Abs: 9.5 10*3/uL — ABNORMAL HIGH (ref 1.7–7.7)
Neutrophils Relative %: 73 %
Platelets: 132 10*3/uL — ABNORMAL LOW (ref 150–400)
RBC: 3.22 MIL/uL — ABNORMAL LOW (ref 3.87–5.11)
RDW: 17 % — ABNORMAL HIGH (ref 11.5–15.5)
WBC: 12.9 10*3/uL — ABNORMAL HIGH (ref 4.0–10.5)
nRBC: 0.9 % — ABNORMAL HIGH (ref 0.0–0.2)

## 2019-05-01 LAB — GLUCOSE, CAPILLARY
Glucose-Capillary: 127 mg/dL — ABNORMAL HIGH (ref 70–99)
Glucose-Capillary: 140 mg/dL — ABNORMAL HIGH (ref 70–99)
Glucose-Capillary: 185 mg/dL — ABNORMAL HIGH (ref 70–99)
Glucose-Capillary: 181 mg/dL — ABNORMAL HIGH (ref 70–99)
Glucose-Capillary: 210 mg/dL — ABNORMAL HIGH (ref 70–99)

## 2019-05-01 LAB — MAGNESIUM
Magnesium: 1.7 mg/dL (ref 1.7–2.4)
Magnesium: 1.9 mg/dL (ref 1.7–2.4)
Magnesium: 1.9 mg/dL (ref 1.7–2.4)
Magnesium: 2 mg/dL (ref 1.7–2.4)
Magnesium: 1.8 mg/dL (ref 1.7–2.4)
Magnesium: 1.7 mg/dL (ref 1.7–2.4)
Magnesium: 1.9 mg/dL (ref 1.7–2.4)

## 2019-05-01 LAB — APTT: aPTT: 39 seconds — ABNORMAL HIGH (ref 24–36)

## 2019-05-01 LAB — PROCALCITONIN: Procalcitonin: 29.76 ng/mL

## 2019-05-01 MED ORDER — SODIUM CHLORIDE 0.9 % IV SOLN
500.0000 mg | INTRAVENOUS | Status: DC
  Administered 2019-05-01 – 2019-05-04 (×4): 500 mg via INTRAVENOUS
  Filled 2019-05-01 (×5): qty 500

## 2019-05-01 MED ORDER — PUREFLOW DIALYSIS SOLUTION
INTRAVENOUS | Status: DC
  Administered 2019-05-01 (×2): via INTRAVENOUS_CENTRAL

## 2019-05-01 MED ORDER — EPOETIN ALFA 10000 UNIT/ML IJ SOLN
10000.0000 [IU] | Freq: Once | INTRAMUSCULAR | Status: AC
  Administered 2019-05-01: 10000 [IU] via SUBCUTANEOUS
  Filled 2019-05-01: qty 1

## 2019-05-01 MED ORDER — MIDAZOLAM HCL 2 MG/2ML IJ SOLN
2.0000 mg | INTRAMUSCULAR | Status: AC | PRN
  Administered 2019-05-01 – 2019-05-02 (×3): 2 mg via INTRAVENOUS
  Filled 2019-05-01 (×3): qty 2

## 2019-05-01 MED ORDER — MIDODRINE HCL 5 MG PO TABS
10.0000 mg | ORAL_TABLET | Freq: Three times a day (TID) | ORAL | Status: DC
  Administered 2019-05-01 – 2019-05-07 (×17): 10 mg via ORAL
  Filled 2019-05-01 (×23): qty 2

## 2019-05-01 MED ORDER — MIDAZOLAM HCL 2 MG/2ML IJ SOLN
2.0000 mg | INTRAMUSCULAR | Status: DC | PRN
  Administered 2019-05-01 – 2019-05-05 (×4): 2 mg via INTRAVENOUS
  Filled 2019-05-01 (×4): qty 2

## 2019-05-01 MED ORDER — SODIUM CHLORIDE 0.9 % IV SOLN
100.0000 mg | Freq: Two times a day (BID) | INTRAVENOUS | Status: DC
  Administered 2019-05-01 – 2019-05-04 (×8): 100 mg via INTRAVENOUS
  Filled 2019-05-01 (×11): qty 100

## 2019-05-01 MED ORDER — POTASSIUM PHOSPHATES 15 MMOLE/5ML IV SOLN
40.0000 meq | Freq: Once | INTRAVENOUS | Status: AC
  Administered 2019-05-01: 40 meq via INTRAVENOUS
  Filled 2019-05-01: qty 9.09

## 2019-05-01 NOTE — Progress Notes (Signed)
Video cal completed with family. Pt sedated and not responding to the camera.

## 2019-05-01 NOTE — Progress Notes (Signed)
PHARMACY CONSULT NOTE - FOLLOW UP  Pharmacy Consult for Electrolyte Monitoring and Replacement   Recent Labs: Potassium (mmol/L)  Date Value  05/01/2019 3.4 (L)  03/31/2015 4.2   Magnesium (mg/dL)  Date Value  05/01/2019 1.7  06/14/2014 1.8   Calcium (mg/dL)  Date Value  05/01/2019 7.6 (L)   Calcium, Total (mg/dL)  Date Value  03/31/2015 7.0 (LL)   Albumin (g/dL)  Date Value  05/01/2019 2.1 (L)  03/31/2015 2.3 (L)   Phosphorus (mg/dL)  Date Value  05/01/2019 2.4 (L)  03/31/2015 3.6   Sodium (mmol/L)  Date Value  05/01/2019 135  03/31/2015 129 (L)     Assessment: Patient is a 63yo female admitted after cardiac arrest. Patient is ESRD and is currently on CRRT. Pharmacy consulted to manage electrolytes.  Plan:  Patient is currently on CRRT, changed to 4K bath today. Patient current receiving KPhos 46mEq IV once. K 3.4, Phos 2.4, Mag 1.7, since KPhos still infusing and levels are improving will not add additional supplementation at this time. Will follow electrolytes q6h.  Paulina Fusi, PharmD, BCPS 05/01/2019 7:07 PM

## 2019-05-01 NOTE — Progress Notes (Signed)
[facility]   Communication Note Will plan for permcath insertion on Monday with Dew if patient condition improves.  Made NPO @ 12am on 05/04/19  Discussed with Eliot Ford PA-C 05/01/2019 4:06 PM

## 2019-05-01 NOTE — Progress Notes (Signed)
[facility] [place] [place] NAME: [name]    MR#:  [id number]  DATE OF BIRTH:  [birth year]  SUBJECTIVE; patient critically ill, intubated, sedated.  Still on low-dose pressors, patient had temp HD catheter placed..  CHIEF COMPLAINT:   Chief Complaint  Patient presents with  . Shortness of Breath    REVIEW OF SYSTEMS:   Review of Systems  Unable to perform ROS: Critical illness     DRUG ALLERGIES:   Allergies  Allergen Reactions  . Iodine Rash  . Enalapril Other (See Comments)    TONGUE SWELLS/GOUT  . Iodinated Diagnostic Agents Other (See Comments)    BREAK OUT IN WHELPS      VITALS:  Blood pressure (!) 92/45, pulse (!) 59, temperature (!) 97.2 F (36.2 C), resp. rate 18, height 5' 5.98" (1.676 m), weight (!) 138.9 kg, SpO2 93 %.  PHYSICAL EXAMINATION:  GENERAL:  63 y.o.-year-old patient lying in the bed with no acute distress.  EYES: Pupils equal, round, reactive to light  No scleral icterus.  HEENT: Head atraumatic, normocephalic.  Currently intubated, sedated.  Clear.  NECK:  Supple, no jugular venous distention. No thyroid enlargement, no tenderness.  LUNGS: Diminished breath sounds bilaterally. CARDIOVASCULAR: S1, S2 normal. No murmurs, rubs, or gallops.  ABDOMEN: Soft, nontender, nondistended. Bowel sounds present. No organomegaly or mass.  EXTREMITIES: No pedal edema, cyanosis, or clubbing.  NEUROLOGIC: Unable to do full neuro exam because of intubation, sedation. PSYCHIATRIC: Intubated, sedated.    SKIN: No obvious rash, lesion, or ulcer.    LABORATORY PANEL:   CBC Recent Labs  Lab 05/01/19 0405  WBC 12.9*  HGB 9.4*  HCT 29.9*  PLT 132*   ------------------------------------------------------------------------------------------------------------------  Chemistries  Recent Labs  Lab 05/03/2019 1451  05/01/19 0543 05/01/19 1052  NA 137   < > 138  --   K 4.7   < > 3.4*  --   CL 99   < > 101   --   CO2 19*   < > 28  --   GLUCOSE 248*  246*   < > 180*  --   BUN 90*   < > 41*  --   CREATININE 13.69*   < > 2.84*  --   CALCIUM 9.0   < > 8.0*  --   MG 2.2   < > 1.9 2.0  AST 32  --   --   --   ALT 41  --   --   --   ALKPHOS 218*  --   --   --   BILITOT 0.9  --   --   --    < > = values in this interval not displayed.   ------------------------------------------------------------------------------------------------------------------  Cardiac Enzymes Recent Labs  Lab 04/29/19 0423  TROPONINI 0.66*   ------------------------------------------------------------------------------------------------------------------  RADIOLOGY:  Dg Chest Port 1 View  Result Date: 05/01/2019 CLINICAL DATA:  Shortness of breath. EXAM: PORTABLE CHEST 1 VIEW COMPARISON:  Chest x-ray from same day at 3:54 a.m. FINDINGS: The patient remains rotated to the right. Unchanged endotracheal and enteric tubes. Unchanged left HeRO graft. Stable cardiomegaly. Normal pulmonary vascularity. Chronic elevation of the right hemidiaphragm with right basilar atelectasis/scarring. Unchanged hazy opacity at the left lung base likely reflecting a combination of atelectasis and small left pleural effusion. No pneumothorax. No acute osseous abnormality. IMPRESSION: 1. Stable chest with bibasilar atelectasis and probable small left pleural effusion. Electronically Signed   By: Titus Dubin  M.D.   On: 05/01/2019 10:20   Dg Chest Port 1 View  Result Date: 05/01/2019 CLINICAL DATA:  Acute respiratory failure. EXAM: PORTABLE CHEST 1 VIEW COMPARISON:  04/29/2019. FINDINGS: Endotracheal tube, NG tube, large caliber left subclavian line in stable position. Stable cardiomegaly. Bilateral pulmonary infiltrates/edema slightly progressed from prior exam. Bibasilar atelectasis particular prominent right lung base. Bilateral pleural effusions cannot be excluded. No pneumothorax. IMPRESSION: 1.  Lines and tubes stable position. 2.  Cardiomegaly unchanged. Diffuse bilateral pulmonary infiltrates/edema slightly progressed from prior exam. Bibasilar atelectasis particular prominent the right lung base. Bilateral pleural effusions cannot be excluded. Electronically Signed   By: Marcello Moores  Register   On: 05/01/2019 06:15   Dg Chest Port 1 View  Result Date: 04/29/2019 CLINICAL DATA:  Acute respiratory failure. EXAM: PORTABLE CHEST 1 VIEW COMPARISON:  Single-view of the chest earlier today and 04/27/2019. FINDINGS: Support tubes and lines are unchanged since the most recent comparison. There is cardiomegaly without edema. Bibasilar airspace disease has worsened on the right. No pneumothorax. There may be a right pleural effusion. IMPRESSION: No change in support apparatus. Bibasilar airspace disease is worse on the right. There may be a right pleural effusion. Cardiomegaly. Electronically Signed   By: Inge Rise M.D.   On: 04/29/2019 18:32    EKG:   Orders placed or performed during the hospital encounter of 04/11/2019  . EKG 12-Lead  . EKG 12-Lead    ASSESSMENT AND PLAN:   63 year old female patient with history of CVA, obstructive sleep apnea on CPAP, ESRD on hemodialysis Tuesday, Thursday, Saturday, congestive heart failure, COPD comes in because of worsening shortness of breath, she was called in the emergency room, had PEA, intubated, admitted to intensive care unit.  1 .acute respiratory failure status post PEA, admitted to ICU, continue full vent support along with sedation, patient did not have further episodes of cardiac arrest in the ICU.  2.  Septic shock secondary to H CAP, continue cefepime, leukocytosis is improving, MRSA screen is negative.  WBC 12.9 today.  Decreased from 25.5.  3.  ESRD, on hemodialysis, now because of hypotension patient is on CRRT.    she is very sensitive and BP drops without pressure so continued on on low-dose pressors if we wean off pressors patient developed severe hypotension as per  registered nurse.  Charlie.  Nephrology following, patient has complications of dialysis device with AV graft clotting, now has temporary HD catheter, vascular plan to do thrombectomy when stable.  All the records are reviewed and case discussed with Care Management/Social Workerr. Management plans discussed with the patient, family and they are in agreement.  CODE STATUS: full  TOTAL TIME TAKING CARE OF THIS PATIENT: 35 minutes. \  More than 50% time spent in counseling, coordination of care. Unstable to plan for discharge patient at this time.  Assisted tele  visit to patient with family member as per RN   Epifanio Lesches M.D on 05/01/2019 at 12:13 PM  Between 7am to 6pm - Pager - [phone]  After 6pm go to [email or web address] - password [email or web address] [facility]  Office  [phone]  CC: Primary care physician; Smithson, [name], MD   Note: This dictation was prepared with Dragon dictation along with smaller phrase technology. Any transcriptional errors that result from this process are unintentional.

## 2019-05-01 NOTE — Progress Notes (Signed)
Video call completed with another family member. Pt still sedated and not responding to the camera.

## 2019-05-01 NOTE — Progress Notes (Signed)
PHARMACY CONSULT NOTE - FOLLOW UP  Pharmacy Consult for Electrolyte Monitoring and Replacement   Recent Labs: Potassium (mmol/L)  Date Value  05/01/2019 3.4 (L)  03/31/2015 4.2   Magnesium (mg/dL)  Date Value  05/01/2019 2.0  06/14/2014 1.8   Calcium (mg/dL)  Date Value  05/01/2019 8.0 (L)   Calcium, Total (mg/dL)  Date Value  03/31/2015 7.0 (LL)   Albumin (g/dL)  Date Value  05/01/2019 2.2 (L)  03/31/2015 2.3 (L)   Phosphorus (mg/dL)  Date Value  05/01/2019 2.4 (L)  03/31/2015 3.6   Sodium (mmol/L)  Date Value  05/01/2019 138  03/31/2015 129 (L)     Assessment: Patient is a 63yo female admitted after cardiac arrest. Patient is ESRD and is currently on CRRT. Pharmacy consulted to manage electrolytes.  Plan:  Patient is currently on CRRT, changed to 4K bath today. Electrolytes will likely correct with CRRT. Will not add any additional supplementation at this time. Will defer to nephrology for supplementation. Will follow electrolytes q6h.  Paulina Fusi, PharmD, BCPS 05/01/2019 11:50 AM

## 2019-05-01 NOTE — Progress Notes (Signed)
Spoke with pts husband [name] Guidry via telephone to update him regarding plan of care and all questions were answered will continue to monitor and assess pts.  Marda Stalker, [place] Pager [phone] (please enter 7 digits) PCCM Consult Pager [phone] (please enter 7 digits)

## 2019-05-01 NOTE — Progress Notes (Signed)
[facility]  ROUNDING NOTE   Subjective:   CRRT 2K bath. No UF  Norepinephrine gtt 63mg  Objective:  Vital signs in last 24 hours:  Temp:  [95.5 F (35.3 C)-99.9 F (37.7 C)] 97.9 F (36.6 C) (05/29 0800) Pulse Rate:  [25-72] 56 (05/29 0800) Resp:  [0-22] 0 (05/29 0800) BP: (82-171)/(38-86) 90/50 (05/29 0800) SpO2:  [90 %-97 %] 93 % (05/29 0800) FiO2 (%):  [65 %-70 %] 65 % (05/29 0738) Weight:  [138.9 kg] 138.9 kg (05/29 0500)  Weight change: 5.1 kg Filed Weights   04/29/19 0500 04/30/19 0500 05/01/19 0500  Weight: 132.3 kg 133.8 kg (!) 138.9 kg    Intake/Output: I/O last 3 completed shifts: In: 1793.3 [I.V.:1173.3; NG/GT:620] Out: 252 [Other:252]   Intake/Output this shift:  No intake/output data recorded.  Physical Exam: General: Critically ill   Head: ETT, OGT  Eyes: Eyes closed  Neck:   trachea midline  Lungs:  PRVC 65%  Heart: Regular rate and rhythm  Abdomen:  Soft, nontender, obese  Extremities:  + peripheral edema.  Neurologic: Intubated and sedated  Skin: No lesions  Access: +thrombosed left arm AVG, temp femoral right HD catheter Dr. ALanney Gins   Basic Metabolic Panel: Recent Labs  Lab 04/30/19 1511 04/30/19 1812 04/30/19 2248 04/30/19 2327 05/01/19 0255 05/01/19 0256 05/01/19 0543  NA 135 134* 135  --  135  --  138  K 3.6 3.6 3.6  --  3.4*  --  3.4*  CL 101 100 101  --  101  --  101  CO2 '26 26 26  ' --  29  --  28  GLUCOSE 188* 202* 198*  --  176*  --  180*  BUN 39* 42* 41*  --  42*  --  41*  CREATININE 3.24* 3.38* 3.09*  --  3.00*  --  2.84*  CALCIUM 7.7* 7.6* 7.7*  --  7.6*  --  8.0*  MG 1.7 1.7  --  1.7  --  1.9 1.9  PHOS 3.1 3.1 2.7  --  2.4*  --  2.4*    Liver Function Tests: Recent Labs  Lab 04/06/2019 0731 04/14/2019 1451  04/30/19 1511 04/30/19 1812 04/30/19 2248 05/01/19 0255 05/01/19 0543  AST 13* 32  --   --   --   --   --   --   ALT 16 41  --   --   --   --   --   --   ALKPHOS 206* 218*  --   --   --    --   --   --   BILITOT 0.9 0.9  --   --   --   --   --   --   PROT 7.7 7.1  --   --   --   --   --   --   ALBUMIN 3.6 3.3*   < > 2.3* 2.3* 2.3* 2.2* 2.2*   < > = values in this interval not displayed.   Recent Labs  Lab 04/17/2019 0731  LIPASE 59*   No results for input(s): AMMONIA in the last 168 hours.  CBC: Recent Labs  Lab 04/04/2019 0731 04/23/2019 1451 04/29/19 0423 04/30/19 0500 05/01/19 0405  WBC 8.2 17.7* 25.5* 17.9* 12.9*  NEUTROABS 6.0 14.7*  --  15.1* 9.5*  HGB 12.1 13.9 13.2 11.1* 9.4*  HCT 37.2 42.6 41.5 34.9* 29.9*  MCV 90.1 90.3 90.6 91.6 92.9  PLT 229 272  220 142* 132*    Cardiac Enzymes: Recent Labs  Lab 05/02/2019 1451 04/18/2019 2139 04/29/19 0423  TROPONINI 0.53* 0.91* 0.66*    BNP: Invalid input(s): POCBNP  CBG: Recent Labs  Lab 04/30/19 1928 04/30/19 2336 04/30/19 2338 05/01/19 0345 05/01/19 0735  GLUCAP 185* 26* 172* 127* 140*    Microbiology: Results for orders placed or performed during the hospital encounter of 04/07/2019  SARS Coronavirus 2 (CEPHEID - Performed in [place] hospital lab), Hosp Order     Status: None   Collection Time: 04/26/2019  7:31 AM  Result Value Ref Range Status   SARS Coronavirus 2 NEGATIVE NEGATIVE Final    Comment: (NOTE) If result is NEGATIVE SARS-CoV-2 target nucleic acids are NOT DETECTED. The SARS-CoV-2 RNA is generally detectable in upper and lower  respiratory specimens during the acute phase of infection. The lowest  concentration of SARS-CoV-2 viral copies this assay can detect is 250  copies / mL. A negative result does not preclude SARS-CoV-2 infection  and should not be used as the sole basis for treatment or other  patient management decisions.  A negative result may occur with  improper specimen collection / handling, submission of specimen other  than nasopharyngeal swab, presence of viral mutation(s) within the  areas targeted by this assay, and inadequate number of viral copies  (<250  copies / mL). A negative result must be combined with clinical  observations, patient history, and epidemiological information. If result is POSITIVE SARS-CoV-2 target nucleic acids are DETECTED. The SARS-CoV-2 RNA is generally detectable in upper and lower  respiratory specimens dur ing the acute phase of infection.  Positive  results are indicative of active infection with SARS-CoV-2.  Clinical  correlation with patient history and other diagnostic information is  necessary to determine patient infection status.  Positive results do  not rule out bacterial infection or co-infection with other viruses. If result is PRESUMPTIVE POSTIVE SARS-CoV-2 nucleic acids MAY BE PRESENT.   A presumptive positive result was obtained on the submitted specimen  and confirmed on repeat testing.  While 2019 novel coronavirus  (SARS-CoV-2) nucleic acids may be present in the submitted sample  additional confirmatory testing may be necessary for epidemiological  and / or clinical management purposes  to differentiate between  SARS-CoV-2 and other Sarbecovirus currently known to infect humans.  If clinically indicated additional testing with an alternate test  methodology [id number]) is advised. The SARS-CoV-2 RNA is generally  detectable in upper and lower respiratory sp ecimens during the acute  phase of infection. The expected result is Negative. Fact Sheet for Patients:  StrictlyIdeas.no Fact Sheet for Healthcare Providers: BankingDealers.co.za This test is not yet approved or cleared by the [place] FDA and has been authorized for detection and/or diagnosis of SARS-CoV-2 by FDA under an Emergency Use Authorization (EUA).  This EUA will remain in effect (meaning this test can be used) for the duration of the COVID-19 declaration under Section 564(b)(1) of the Act, 21 U.S.C. section 360bbb-3(b)(1), unless the authorization is terminated or revoked  sooner. Performed at [facility], [address] [employer], [place] [address]   MRSA PCR Screening     Status: None   Collection Time: 04/25/2019  9:04 AM  Result Value Ref Range Status   MRSA by PCR NEGATIVE NEGATIVE Final    Comment:        The GeneXpert MRSA Assay (FDA approved for NASAL specimens only), is one component of a comprehensive MRSA colonization surveillance program. It  is not intended to diagnose MRSA infection nor to guide or monitor treatment for MRSA infections. Performed at [facility], [address] [place], [place] [address]     Coagulation Studies: No results for input(s): LABPROT, INR in the last 72 hours.  Urinalysis: No results for input(s): COLORURINE, LABSPEC, PHURINE, GLUCOSEU, HGBUR, BILIRUBINUR, KETONESUR, PROTEINUR, UROBILINOGEN, NITRITE, LEUKOCYTESUR in the last 72 hours.  Invalid input(s): APPERANCEUR    Imaging: Dg Chest Port 1 View  Result Date: 05/01/2019 CLINICAL DATA:  Acute respiratory failure. EXAM: PORTABLE CHEST 1 VIEW COMPARISON:  04/29/2019. FINDINGS: Endotracheal tube, NG tube, large caliber left subclavian line in stable position. Stable cardiomegaly. Bilateral pulmonary infiltrates/edema slightly progressed from prior exam. Bibasilar atelectasis particular prominent right lung base. Bilateral pleural effusions cannot be excluded. No pneumothorax. IMPRESSION: 1.  Lines and tubes stable position. 2. Cardiomegaly unchanged. Diffuse bilateral pulmonary infiltrates/edema slightly progressed from prior exam. Bibasilar atelectasis particular prominent the right lung base. Bilateral pleural effusions cannot be excluded. Electronically Signed   By: Marcello Moores  Register   On: 05/01/2019 06:15   Dg Chest Port 1 View  Result Date: 04/29/2019 CLINICAL DATA:  Acute respiratory failure. EXAM: PORTABLE CHEST 1 VIEW COMPARISON:  Single-view of the chest earlier today and 04/19/2019. FINDINGS: Support tubes and lines are unchanged  since the most recent comparison. There is cardiomegaly without edema. Bibasilar airspace disease has worsened on the right. No pneumothorax. There may be a right pleural effusion. IMPRESSION: No change in support apparatus. Bibasilar airspace disease is worse on the right. There may be a right pleural effusion. Cardiomegaly. Electronically Signed   By: Inge Rise M.D.   On: 04/29/2019 18:32     Medications:   . ceFEPime (MAXIPIME) IV 2 g (04/30/19 2317)  . dexmedetomidine (PRECEDEX) IV infusion 1.2 mcg/kg/hr (05/01/19 0830)  . norepinephrine (LEVOPHED) Adult infusion 5 mcg/min (04/30/19 1337)  . pureflow 2,000 mL/hr at 05/01/19 0356   . allopurinol  150 mg Per Tube Once per day on Tue Thu Sat  . amitriptyline  25 mg Per Tube QHS  . atorvastatin  40 mg Per Tube Daily  . chlorhexidine gluconate (MEDLINE KIT)  15 mL Mouth Rinse BID  . Chlorhexidine Gluconate Cloth  6 each Topical Daily  . citalopram  40 mg Per Tube Daily  . clopidogrel  75 mg Per Tube Daily  . docusate  200 mg Per Tube BID  . famotidine  20 mg Per Tube Daily  . feeding supplement (PRO-STAT SUGAR FREE 64)  60 mL Per Tube QID  . feeding supplement (VITAL HIGH PROTEIN)  1,000 mL Per Tube Q24H  . fluticasone  2 spray Each Nare Daily  . heparin injection (subcutaneous)  5,000 Units Subcutaneous Q8H  . insulin aspart  0-9 Units Subcutaneous Q4H  . loratadine  10 mg Per Tube QHS  . mouth rinse  15 mL Mouth Rinse 10 times per day  . multivitamin  15 mL Per Tube Daily  . sodium chloride flush  10-40 mL Intracatheter Q12H   albuterol, calcium carbonate, fentaNYL (SUBLIMAZE) injection, fentaNYL (SUBLIMAZE) injection, heparin, lidocaine-prilocaine, midazolam, midazolam, nystatin, polyethylene glycol powder, polyvinyl alcohol, sodium chloride flush  Assessment/ Plan:  Ms. MARLYN TONDREAU is a 63 y.o. black female with end stage renal disease on hemodialysis, hypertension, CVA, obstructive sleep apnea, peripheral vascular  disease, hypertension, gout, GERD, diabetes mellitus type II, COPD, coronary artery disease, congestive heart failure, who  was admitted to [facility] on 04/21/2019 for pneumonia. Acute respiratory failure requiring mechanical  ventilation and vasopressors.   TTS [facility] left AVG   1. End stage renal disease   Complication of dialysis device, clotted AVG. Temp HD catheter placed by ICU - Continue Continuous renal replacement therapy: CVVHD  - Change to 4K bath.  - Hold UF until more hemodynamically stable - Appreciate vascular input. Thrombectomy once patient is stable.   2. Anemia of chronic kidney disease: holding epo. Hemoglobin 9.4 - EPO subcu today.    3. Sepsis/Hypotension: on minimal vasopressors. Leukocytosis improved from yesterday.  - Empiric antibiotics: cefepime.   LOS: 3 Sarath Kolluru 5/29/20208:57 AM

## 2019-05-01 NOTE — Progress Notes (Signed)
Husband updated.

## 2019-05-01 NOTE — Progress Notes (Signed)
CRITICAL CARE NOTE      SUBJECTIVE FINDINGS & SIGNIFICANT EVENTS   Patient remains critically ill  Patient is clinically improved, on minimal doses of vasopressor support now.  Discussed case with nephrologist this a.m.  Vent management - increased PEEP to 10.  PAST MEDICAL HISTORY   Past Medical History:  Diagnosis Date  . Anemia   . Anxiety   . Arthritis   . Cancer [facility])    Left Kidney Cancer  . CHF (congestive heart failure) ([place])   . Chronic kidney disease   . COPD (chronic obstructive pulmonary disease) ([facility])    Use Oxygen at bedtime  . Coronary artery disease   . Diabetes mellitus without complication ([place])   . Dialysis patient ([place])    Tues, Thurs, Sat  . Dyspnea    with exertion  . GERD (gastroesophageal reflux disease)   . Gout   . History of kidney stones   . Hypertension   . Neuropathy   . Peripheral vascular disease ([place])   . Sleep apnea    OSA--USE C-PAP  . Stroke [facility]) 2004     SURGICAL HISTORY   Past Surgical History:  Procedure Laterality Date  . A/V FISTULAGRAM Left 04/26/2017   Procedure: A/V Fistulagram;  Surgeon: Katha Cabal, MD;  Location: [place] CV LAB;  Service: Cardiovascular;  Laterality: Left;  . ABDOMINAL HYSTERECTOMY    . CARDIAC CATHETERIZATION    . DIALYSIS/PERMA CATHETER INSERTION N/A 01/31/2017   Procedure: Dialysis/Perma Catheter Insertion;  Surgeon: Algernon Huxley, MD;  Location: [place] CV LAB;  Service: Cardiovascular;  Laterality: N/A;  . DIALYSIS/PERMA CATHETER INSERTION N/A 02/21/2017   Procedure: Dialysis/Perma Catheter Insertion;  Surgeon: Algernon Huxley, MD;  Location: [place] CV LAB;  Service: Cardiovascular;  Laterality: N/A;  . DIALYSIS/PERMA CATHETER INSERTION N/A 03/19/2017   Procedure: Dialysis/Perma Catheter Insertion;   Surgeon: Katha Cabal, MD;  Location: [place] CV LAB;  Service: Cardiovascular;  Laterality: N/A;  . DIALYSIS/PERMA CATHETER REMOVAL N/A 07/19/2017   Procedure: DIALYSIS/PERMA CATHETER REMOVAL;  Surgeon: Katha Cabal, MD;  Location: [place] CV LAB;  Service: Cardiovascular;  Laterality: N/A;  . EYE SURGERY Bilateral    Cataract Extraction with IOL  . KIDNEY SURGERY Left    Partial Nephrectomy  . LEFT HEART CATH AND CORONARY ANGIOGRAPHY N/A 04/05/2017   Procedure: Left Heart Cath and Coronary Angiography;  Surgeon: Wellington Hampshire, MD;  Location: [place] CV LAB;  Service: Cardiovascular;  Laterality: N/A;  . PERIPHERAL VASCULAR CATHETERIZATION N/A 01/23/2016   Procedure: Dialysis/Perma Catheter Insertion;  Surgeon: Algernon Huxley, MD;  Location: [place] CV LAB;  Service: Cardiovascular;  Laterality: N/A;  . PERIPHERAL VASCULAR CATHETERIZATION Left 10/01/2016   Procedure: A/V Shuntogram/Fistulagram;  Surgeon: Algernon Huxley, MD;  Location: [place] CV LAB;  Service: Cardiovascular;  Laterality: Left;  . PERIPHERAL VASCULAR THROMBECTOMY Left 11/24/2018   Procedure: PERIPHERAL VASCULAR THROMBECTOMY;  Surgeon: Algernon Huxley, MD;  Location: [place] CV LAB;  Service: Cardiovascular;  Laterality: Left;  . UPPER EXTREMITY VENOGRAPHY Bilateral 04/26/2017   Procedure: Upper Extremity Venography;  Surgeon: Katha Cabal, MD;  Location: [place] CV LAB;  Service: Cardiovascular;  Laterality: Bilateral;  . VASCULAR ACCESS DEVICE INSERTION Left 05/22/2017   Procedure: INSERTION OF HERO VASCULAR ACCESS DEVICE;  Surgeon: Katha Cabal, MD;  Location: [facility] ORS;  Service: Vascular;  Laterality: Left;     FAMILY HISTORY   Family History  Problem Relation Age  of Onset  . Hypertension Mother   . Heart failure Mother   . Diabetes Father   . Heart attack Father   . Lung cancer Maternal Aunt   . Cancer Maternal Aunt      SOCIAL HISTORY   Social History    Tobacco Use  . Smoking status: Former Research scientist (life sciences)  . Smokeless tobacco: Never Used  Substance Use Topics  . Alcohol use: No  . Drug use: No     MEDICATIONS   Current Medication:  Current Facility-Administered Medications:  .  albuterol (PROVENTIL) (2.5 MG/3ML) 0.083% nebulizer solution 2.5 mg, 2.5 mg, Nebulization, Q6H PRN, Manuella Ghazi, Vipul, MD .  allopurinol (ZYLOPRIM) tablet 150 mg, 150 mg, Per Tube, Once per day on Tue Thu Sat, Shah, Vipul, MD, 150 mg at 04/30/19 1052 .  amitriptyline (ELAVIL) tablet 25 mg, 25 mg, Per Tube, QHS, Max Sane, MD, 25 mg at 04/30/19 2244 .  atorvastatin (LIPITOR) tablet 40 mg, 40 mg, Per Tube, Daily, Max Sane, MD, 40 mg at 04/30/19 2243 .  calcium carbonate (TUMS - dosed in mg elemental calcium) chewable tablet 200 mg of elemental calcium, 1 tablet, Oral, PRN, Ouma, Bing Neighbors, NP .  ceFEPIme (MAXIPIME) 2 g in sodium chloride 0.9 % 100 mL IVPB, 2 g, Intravenous, Q12H, Vira Blanco, RPH, Last Rate: 200 mL/hr at 04/30/19 2317, 2 g at 04/30/19 2317 .  chlorhexidine gluconate (MEDLINE KIT) (PERIDEX) 0.12 % solution 15 mL, 15 mL, Mouth Rinse, BID, Lanney Gins, , MD, 15 mL at 04/30/19 1930 .  Chlorhexidine Gluconate Cloth 2 % PADS 6 each, 6 each, Topical, Daily, Max Sane, MD, 6 each at 04/30/19 1234 .  citalopram (CELEXA) tablet 40 mg, 40 mg, Per Tube, Daily, Max Sane, MD, 40 mg at 04/30/19 1051 .  clopidogrel (PLAVIX) tablet 75 mg, 75 mg, Per Tube, Daily, Max Sane, MD, 75 mg at 04/30/19 1051 .  dexmedetomidine (PRECEDEX) 400 MCG/100ML (4 mcg/mL) infusion, 0.4-1.2 mcg/kg/hr, Intravenous, Titrated, , , MD, Last Rate: 37.6 mL/hr at 05/01/19 0609, 1.2 mcg/kg/hr at 05/01/19 0609 .  docusate (COLACE) 50 MG/5ML liquid 200 mg, 200 mg, Per Tube, BID, Vira Blanco, RPH, 200 mg at 04/30/19 2243 .  famotidine (PEPCID) tablet 20 mg, 20 mg, Per Tube, Daily, Awilda Bill, NP, 20 mg at 04/30/19 1051 .  feeding supplement (PRO-STAT SUGAR FREE  64) liquid 60 mL, 60 mL, Per Tube, QID, , , MD, 60 mL at 04/30/19 2243 .  feeding supplement (VITAL HIGH PROTEIN) liquid 1,000 mL, 1,000 mL, Per Tube, Q24H, , , MD, 1,000 mL at 04/30/19 1510 .  fentaNYL (SUBLIMAZE) injection 50 mcg, 50 mcg, Intravenous, Q15 min PRN, Awilda Bill, NP .  fentaNYL (SUBLIMAZE) injection 50-200 mcg, 50-200 mcg, Intravenous, Q30 min PRN, Awilda Bill, NP, 100 mcg at 04/30/19 2312 .  fluticasone (FLONASE) 50 MCG/ACT nasal spray 2 spray, 2 spray, Each Nare, Daily, Ouma, Elizabeth Achieng, NP .  heparin injection 1,000-6,000 Units, 1,000-6,000 Units, CRRT, PRN, Kolluru, Sarath, MD .  heparin injection 5,000 Units, 5,000 Units, Subcutaneous, Q8H, Awilda Bill, NP, 5,000 Units at 05/01/19 [phone] .  insulin aspart (novoLOG) injection 0-9 Units, 0-9 Units, Subcutaneous, Q4H, Ottie Glazier, MD, 2 Units at 05/01/19 0058 .  lidocaine-prilocaine (EMLA) cream 1 application, 1 application, Topical, PRN, Ouma, Bing Neighbors, NP .  loratadine (CLARITIN) tablet 10 mg, 10 mg, Per Tube, QHS, Vira Blanco, RPH, 10 mg at 04/30/19 2243 .  MEDLINE mouth rinse, 15 mL, Mouth Rinse, 10  times per day, Ottie Glazier, MD, 15 mL at 05/01/19 0547 .  midazolam (VERSED) injection 2 mg, 2 mg, Intravenous, Q15 min PRN, Darel Hong D, NP, 2 mg at 05/01/19 0058 .  midazolam (VERSED) injection 2 mg, 2 mg, Intravenous, Q2H PRN, Darel Hong D, NP, 2 mg at 05/01/19 0300 .  multivitamin liquid 15 mL, 15 mL, Per Tube, Daily, Lanney Gins, , MD, 15 mL at 04/30/19 1052 .  norepinephrine (LEVOPHED) 16 mg in dextrose 5 % 250 mL (0.064 mg/mL) infusion, 0-40 mcg/min, Intravenous, Titrated, , , MD, Last Rate: 4.69 mL/hr at 04/30/19 1337, 5 mcg/min at 04/30/19 1337 .  nystatin (MYCOSTATIN/NYSTOP) topical powder 1 g, 1 g, Topical, Daily PRN, Ouma, Bing Neighbors, NP .  polyethylene glycol powder (GLYCOLAX/MIRALAX) container 17 g, 17 g, Oral, TID PRN,  Lang Snow, NP .  polyvinyl alcohol (LIQUIFILM TEARS) 1.4 % ophthalmic solution 1-2 drop, 1-2 drop, Both Eyes, QID PRN, Lang Snow, NP .  pureflow IV solution for Dialysis, , CRRT, Continuous, Kolluru, Sarath, MD, Last Rate: 2,000 mL/hr at 05/01/19 0356 .  sodium chloride flush (NS) 0.9 % injection 10-40 mL, 10-40 mL, Intracatheter, Q12H, Lanney Gins, , MD, 10 mL at 04/30/19 2315 .  sodium chloride flush (NS) 0.9 % injection 10-40 mL, 10-40 mL, Intracatheter, PRN, Ottie Glazier, MD    ALLERGIES   Iodine; Enalapril; and Iodinated diagnostic agents    REVIEW OF SYSTEMS    10 point ROS unable to obtain due to acutely ill state on mechanical ventilation and sedation  PHYSICAL EXAMINATION   Vitals:   05/01/19 0500 05/01/19 0600  BP: (!) 96/48 (!) 107/50  Pulse: 60 (!) 58  Resp: 20 18  Temp: 99 F (37.2 C) 98.6 F (37 C)  SpO2: 91% 93%    GENERAL: Sedated on mechanical ventilation HEAD: Normocephalic, atraumatic.  EYES: Pupils equal, round, reactive to light.  No scleral icterus.  MOUTH: Moist mucosal membrane. NECK: Supple. No thyromegaly. No nodules. No JVD.  PULMONARY: Decreased breath sounds bilaterally CARDIOVASCULAR: S1 and S2. Regular rate and rhythm. No murmurs, rubs, or gallops.  GASTROINTESTINAL: Soft, nontender, non-distended. No masses. Positive bowel sounds. No hepatosplenomegaly.  MUSCULOSKELETAL: No swelling, clubbing, or edema.  NEUROLOGIC: Mild distress due to acute illness SKIN:intact,warm,dry   LABS AND IMAGING      LAB RESULTS: Recent Labs  Lab 04/30/19 2248 05/01/19 0255 05/01/19 0543  NA 135 135 138  K 3.6 3.4* 3.4*  CL 101 101 101  CO2 _0 BUN 41* 42* 41*  CREATININE 3.09* 3.00* 2.84*  GLUCOSE 198* 176* 180*   Recent Labs  Lab 04/29/19 0423 04/30/19 0500 05/01/19 0405  HGB 13.2 11.1* 9.4*  HCT 41.5 34.9* 29.9*  WBC 25.5* 17.9* 12.9*  PLT 220 142* 132*     IMAGING RESULTS: Dg Chest Port 1  View  Result Date: 05/01/2019 CLINICAL DATA:  Acute respiratory failure. EXAM: PORTABLE CHEST 1 VIEW COMPARISON:  04/29/2019. FINDINGS: Endotracheal tube, NG tube, large caliber left subclavian line in stable position. Stable cardiomegaly. Bilateral pulmonary infiltrates/edema slightly progressed from prior exam. Bibasilar atelectasis particular prominent right lung base. Bilateral pleural effusions cannot be excluded. No pneumothorax. IMPRESSION: 1.  Lines and tubes stable position. 2. Cardiomegaly unchanged. Diffuse bilateral pulmonary infiltrates/edema slightly progressed from prior exam. Bibasilar atelectasis particular prominent the right lung base. Bilateral pleural effusions cannot be excluded. Electronically Signed   By: [place]   On: 05/01/2019 06:15        ASSESSMENT AND  PLAN    Acutecomatose state -mental status improved now - patient able to follow verbal communication inconsistently -continue Full MV support -continue Bronchodilator Therapy -Wean Fio2 and PEEP as tolerated -will perform SAT/SBT when respiratory parameters are met -Discussed with Husband Tommy Campoli (multiple times since admission) - he is appreciative of care    Severe hyperkalemia - resolved     S/p dialysis via CRRT - HCO3-, insulin, Veltasa, Calcium gluconate ICU monitoring Nephrology on case- appreciate input pharmacy consultation - now with hypokalemia and hypophosphatemia   NSTEMI -Status post cardiac arrest and ACLS -Trending troponin 0 0.53- trending down 0.91-0.66 -Empirically initiating heparin drip-patient continues to have clotting issues with dialysis catheter despite therapeutic heparin    Renal Failure-Renal failure ESRD - starting CRRT - L trialysis cath nephro on case - appreciate input -follow chem 7 -follow UO -continue Foley Catheter-assess need daily -did not take off fluid   Possible pneumonia   -Repeat CXR today yesterday  chest x-ray reviewed by me with significant rotation to the right, evidence of pleural effusion with atelectasis difficult to rule out infiltrate. -We will reduce nephrotoxic medications currently on Zithromax and doxycycline IV -Have stopped cefepime due to CNS effects of encephalopathy    NEUROLOGY - intubated and sedated - minimal sedation to achieve a RASS goal: -1 Wake up assessment pending    ID -continue IV abx as prescibed -follow up cultures  GI/Nutrition GI PROPHYLAXIS as indicated DIET-->TF's as tolerated Constipation protocol as indicated  ENDO - ICU hypoglycemic\Hyperglycemia protocol -check FSBS per protocol   ELECTROLYTES -follow labs as needed -replace as needed -pharmacy consultation   DVT/GI PRX ordered -SCDs  TRANSFUSIONS AS NEEDED MONITOR FSBS ASSESS the need for LABS as needed   Critical care provider statement:  Critical care time (minutes):31 Critical care time was exclusive of: Separately billable procedures and treating other patients Critical care was necessary to treat or prevent imminent or life-threatening deterioration of the following conditions:Circulatory shock, cardiac arrest, acute comatose state, severe hyperkalemia, AV graft fistula malfunction, fluid overload, diabetes, OSA, multiple comorbid conditions Critical care was time spent personally by me on the following activities: Development of treatment plan with patient or surrogate, discussions with consultants, evaluation of patient's response to treatment, examination of patient, obtaining history from patient or surrogate, ordering and performing treatments and interventions, ordering and review of laboratory studies and re-evaluation of patient's condition. I assumed direction of critical care for this patient from another provider in my specialty: no   This document was prepared using Dragon voice recognition software and may include unintentional  dictation errors.      Ottie Glazier, M.D.  Division of [place]

## 2019-05-02 LAB — CBC WITH DIFFERENTIAL/PLATELET
Abs Immature Granulocytes: 0.34 10*3/uL — ABNORMAL HIGH (ref 0.00–0.07)
Basophils Absolute: 0.1 10*3/uL (ref 0.0–0.1)
Basophils Relative: 1 %
Eosinophils Absolute: 0.1 10*3/uL (ref 0.0–0.5)
Eosinophils Relative: 1 %
HCT: 27.9 % — ABNORMAL LOW (ref 36.0–46.0)
Hemoglobin: 8.7 g/dL — ABNORMAL LOW (ref 12.0–15.0)
Immature Granulocytes: 4 %
Lymphocytes Relative: 16 %
Lymphs Abs: 1.5 10*3/uL (ref 0.7–4.0)
MCH: 29.4 pg (ref 26.0–34.0)
MCHC: 31.2 g/dL (ref 30.0–36.0)
MCV: 94.3 fL (ref 80.0–100.0)
Monocytes Absolute: 0.7 10*3/uL (ref 0.1–1.0)
Monocytes Relative: 8 %
Neutro Abs: 6.5 10*3/uL (ref 1.7–7.7)
Neutrophils Relative %: 70 %
Platelets: 142 10*3/uL — ABNORMAL LOW (ref 150–400)
RBC: 2.96 MIL/uL — ABNORMAL LOW (ref 3.87–5.11)
RDW: 17.2 % — ABNORMAL HIGH (ref 11.5–15.5)
WBC: 9.1 10*3/uL (ref 4.0–10.5)
nRBC: 2.7 % — ABNORMAL HIGH (ref 0.0–0.2)

## 2019-05-02 LAB — GLUCOSE, CAPILLARY
Glucose-Capillary: 132 mg/dL — ABNORMAL HIGH (ref 70–99)
Glucose-Capillary: 154 mg/dL — ABNORMAL HIGH (ref 70–99)
Glucose-Capillary: 161 mg/dL — ABNORMAL HIGH (ref 70–99)
Glucose-Capillary: 169 mg/dL — ABNORMAL HIGH (ref 70–99)
Glucose-Capillary: 175 mg/dL — ABNORMAL HIGH (ref 70–99)
Glucose-Capillary: 227 mg/dL — ABNORMAL HIGH (ref 70–99)

## 2019-05-02 LAB — CBC
HCT: 27.1 % — ABNORMAL LOW (ref 36.0–46.0)
Hemoglobin: 8.7 g/dL — ABNORMAL LOW (ref 12.0–15.0)
MCH: 29.7 pg (ref 26.0–34.0)
MCHC: 32.1 g/dL (ref 30.0–36.0)
MCV: 92.5 fL (ref 80.0–100.0)
Platelets: 158 10*3/uL (ref 150–400)
RBC: 2.93 MIL/uL — ABNORMAL LOW (ref 3.87–5.11)
RDW: 17.3 % — ABNORMAL HIGH (ref 11.5–15.5)
WBC: 9.9 10*3/uL (ref 4.0–10.5)
nRBC: 3 % — ABNORMAL HIGH (ref 0.0–0.2)

## 2019-05-02 LAB — RENAL FUNCTION PANEL
Albumin: 2.3 g/dL — ABNORMAL LOW (ref 3.5–5.0)
Albumin: 2.2 g/dL — ABNORMAL LOW (ref 3.5–5.0)
Anion gap: 7 (ref 5–15)
Anion gap: 8 (ref 5–15)
BUN: 49 mg/dL — ABNORMAL HIGH (ref 8–23)
BUN: 55 mg/dL — ABNORMAL HIGH (ref 8–23)
CO2: 26 mmol/L (ref 22–32)
CO2: 26 mmol/L (ref 22–32)
Calcium: 7.6 mg/dL — ABNORMAL LOW (ref 8.9–10.3)
Calcium: 7.8 mg/dL — ABNORMAL LOW (ref 8.9–10.3)
Chloride: 102 mmol/L (ref 98–111)
Chloride: 102 mmol/L (ref 98–111)
Creatinine, Ser: 2.61 mg/dL — ABNORMAL HIGH (ref 0.44–1.00)
Creatinine, Ser: 3.15 mg/dL — ABNORMAL HIGH (ref 0.44–1.00)
GFR calc Af Amer: 22 mL/min — ABNORMAL LOW (ref >60)
GFR calc Af Amer: 17 mL/min — ABNORMAL LOW (ref >60)
GFR calc non Af Amer: 19 mL/min — ABNORMAL LOW (ref >60)
GFR calc non Af Amer: 15 mL/min — ABNORMAL LOW (ref >60)
Glucose, Bld: 194 mg/dL — ABNORMAL HIGH (ref 70–99)
Glucose, Bld: 193 mg/dL — ABNORMAL HIGH (ref 70–99)
Phosphorus: 2.8 mg/dL (ref 2.5–4.6)
Phosphorus: 2.5 mg/dL (ref 2.5–4.6)
Potassium: 3.8 mmol/L (ref 3.5–5.1)
Potassium: 3.9 mmol/L (ref 3.5–5.1)
Sodium: 135 mmol/L (ref 135–145)
Sodium: 136 mmol/L (ref 135–145)

## 2019-05-02 LAB — APTT: aPTT: 42 seconds — ABNORMAL HIGH (ref 24–36)

## 2019-05-02 LAB — PROCALCITONIN: Procalcitonin: 25.51 ng/mL

## 2019-05-02 LAB — MAGNESIUM: Magnesium: 1.9 mg/dL (ref 1.7–2.4)

## 2019-05-02 MED ORDER — EPOETIN ALFA 10000 UNIT/ML IJ SOLN
10000.0000 [IU] | INTRAMUSCULAR | Status: DC
  Administered 2019-05-02 – 2019-05-19 (×8): 10000 [IU] via INTRAVENOUS
  Filled 2019-05-02 (×7): qty 1

## 2019-05-02 NOTE — Progress Notes (Signed)
[facility]  ROUNDING NOTE   Subjective:   Taken off CRRT last night. Placed on intermittent hemodialysis this morning. Seen and examined during treatment. Tolerating treatment well.  No UF goal.   Norepinephrine gtt 86mg  Objective:  Vital signs in last 24 hours:  Temp:  [95.7 F (35.4 C)-101.1 F (38.4 C)] 100.4 F (38 C) (05/30 0850) Pulse Rate:  [51-77] 76 (05/30 0930) Resp:  [13-28] 22 (05/30 0930) BP: (81-133)/(42-74) 82/44 (05/30 0930) SpO2:  [91 %-100 %] 92 % (05/30 0930) FiO2 (%):  [45 %-55 %] 45 % (05/30 0822) Weight:  [138.1 kg-141.2 kg] 138.1 kg (05/30 0845)  Weight change: 2.3 kg Filed Weights   05/01/19 0500 05/02/19 0500 05/02/19 0845  Weight: (!) 138.9 kg (!) 141.2 kg (!) 138.1 kg    Intake/Output: I/O last 3 completed shifts: In: 2171.2 [I.V.:843.5; NG/GT:327.7; IV Piggyback:1000] Out: 0    Intake/Output this shift:  No intake/output data recorded.  Physical Exam: General: Critically ill   Head: ETT, OGT  Eyes: Eyes closed  Neck:   trachea midline  Lungs:  PRVC 45%  Heart: Regular rate and rhythm  Abdomen:  Soft, nontender, obese  Extremities:  + peripheral edema.  Neurologic: Intubated and sedated  Skin: No lesions  Access: +thrombosed left arm AVG, temp femoral right HD catheter Dr. ALanney Gins   Basic Metabolic Panel: Recent Labs  Lab 05/01/19 0543 05/01/19 1052 05/01/19 1345 05/01/19 1800 05/01/19 2209 05/02/19 0404  NA 138  --  135 135 135 135  K 3.4*  --  3.1* 3.4* 3.7 3.8  CL 101  --  104 103 102 102  CO2 28  --  _0 GLUCOSE 180*  --  202* 202* 204* 194*  BUN 41*  --  36* 36* 39* 49*  CREATININE 2.84*  --  2.27* 2.20* 2.19* 2.61*  CALCIUM 8.0*  --  7.3* 7.6* 8.0* 7.6*  MG 1.9 2.0 1.8 1.7 1.9 1.9  PHOS 2.4*  --  1.8* 2.4* 2.6 2.8    Liver Function Tests: Recent Labs  Lab 04/19/2019 0731 04/13/2019 1451  05/01/19 0543 05/01/19 1345 05/01/19 1800 05/01/19 2209 05/02/19 0404  AST 13* 32  --   --    --   --   --   --   ALT 16 41  --   --   --   --   --   --   ALKPHOS 206* 218*  --   --   --   --   --   --   BILITOT 0.9 0.9  --   --   --   --   --   --   PROT 7.7 7.1  --   --   --   --   --   --   ALBUMIN 3.6 3.3*   < > 2.2* 2.1* 2.1* 2.2* 2.3*   < > = values in this interval not displayed.   Recent Labs  Lab 04/19/2019 0731  LIPASE 59*   No results for input(s): AMMONIA in the last 168 hours.  CBC: Recent Labs  Lab 04/27/2019 0731 04/10/2019 1451 04/29/19 0423 04/30/19 0500 05/01/19 0405 05/02/19 0404 05/02/19 0910  WBC 8.2 17.7* 25.5* 17.9* 12.9* 9.1 PENDING  NEUTROABS 6.0 14.7*  --  15.1* 9.5* 6.5  --   HGB 12.1 13.9 13.2 11.1* 9.4* 8.7* 8.7*  HCT 37.2 42.6 41.5 34.9* 29.9* 27.9* 27.1*  MCV 90.1 90.3 90.6 91.6 92.9  94.3 92.5  PLT 229 272 220 142* 132* 142* 158    Cardiac Enzymes: Recent Labs  Lab 04/14/2019 1451 04/08/2019 2139 04/29/19 0423  TROPONINI 0.53* 0.91* 0.66*    BNP: Invalid input(s): POCBNP  CBG: Recent Labs  Lab 05/01/19 1141 05/01/19 1600 05/01/19 1939 05/02/19 0340 05/02/19 0735  GLUCAP 185* 181* 210* 161* 175*    Microbiology: Results for orders placed or performed during the hospital encounter of 04/16/2019  SARS Coronavirus 2 (CEPHEID - Performed in [place] hospital lab), Hosp Order     Status: None   Collection Time: 04/29/2019  7:31 AM  Result Value Ref Range Status   SARS Coronavirus 2 NEGATIVE NEGATIVE Final    Comment: (NOTE) If result is NEGATIVE SARS-CoV-2 target nucleic acids are NOT DETECTED. The SARS-CoV-2 RNA is generally detectable in upper and lower  respiratory specimens during the acute phase of infection. The lowest  concentration of SARS-CoV-2 viral copies this assay can detect is 250  copies / mL. A negative result does not preclude SARS-CoV-2 infection  and should not be used as the sole basis for treatment or other  patient management decisions.  A negative result may occur with  improper specimen collection /  handling, submission of specimen other  than nasopharyngeal swab, presence of viral mutation(s) within the  areas targeted by this assay, and inadequate number of viral copies  (<250 copies / mL). A negative result must be combined with clinical  observations, patient history, and epidemiological information. If result is POSITIVE SARS-CoV-2 target nucleic acids are DETECTED. The SARS-CoV-2 RNA is generally detectable in upper and lower  respiratory specimens dur ing the acute phase of infection.  Positive  results are indicative of active infection with SARS-CoV-2.  Clinical  correlation with patient history and other diagnostic information is  necessary to determine patient infection status.  Positive results do  not rule out bacterial infection or co-infection with other viruses. If result is PRESUMPTIVE POSTIVE SARS-CoV-2 nucleic acids MAY BE PRESENT.   A presumptive positive result was obtained on the submitted specimen  and confirmed on repeat testing.  While 2019 novel coronavirus  (SARS-CoV-2) nucleic acids may be present in the submitted sample  additional confirmatory testing may be necessary for epidemiological  and / or clinical management purposes  to differentiate between  SARS-CoV-2 and other Sarbecovirus currently known to infect humans.  If clinically indicated additional testing with an alternate test  methodology [phone]) is advised. The SARS-CoV-2 RNA is generally  detectable in upper and lower respiratory sp ecimens during the acute  phase of infection. The expected result is Negative. Fact Sheet for Patients:  StrictlyIdeas.no Fact Sheet for Healthcare Providers: BankingDealers.co.za This test is not yet approved or cleared by the [place] FDA and has been authorized for detection and/or diagnosis of SARS-CoV-2 by FDA under an Emergency Use Authorization (EUA).  This EUA will remain in effect (meaning this  test can be used) for the duration of the COVID-19 declaration under Section 564(b)(1) of the Act, 21 U.S.C. section 360bbb-3(b)(1), unless the authorization is terminated or revoked sooner. Performed at [facility], [address] [place], [place] [address]   MRSA PCR Screening     Status: None   Collection Time: 04/22/2019  9:04 AM  Result Value Ref Range Status   MRSA by PCR NEGATIVE NEGATIVE Final    Comment:        The GeneXpert MRSA Assay (FDA approved for NASAL specimens only), is one component  of a comprehensive MRSA colonization surveillance program. It is not intended to diagnose MRSA infection nor to guide or monitor treatment for MRSA infections. Performed at [facility], [address] [place], [place] [address]   Culture, respiratory (non-expectorated)     Status: None (Preliminary result)   Collection Time: 05/01/19 11:05 AM  Result Value Ref Range Status   Specimen Description   Final    TRACHEAL ASPIRATE Performed at [facility], [address], [place], [place] [address]    Special Requests   Final    NONE Performed at [facility], [address] [place], [place] [address]    Gram Stain   Final    MODERATE WBC PRESENT, PREDOMINANTLY PMN RARE YEAST Performed at [facility], [place] [address] [place],  [address]    Culture PENDING  Incomplete   Report Status PENDING  Incomplete    Coagulation Studies: No results for input(s): LABPROT, INR in the last 72 hours.  Urinalysis: No results for input(s): COLORURINE, LABSPEC, PHURINE, GLUCOSEU, HGBUR, BILIRUBINUR, KETONESUR, PROTEINUR, UROBILINOGEN, NITRITE, LEUKOCYTESUR in the last 72 hours.  Invalid input(s): APPERANCEUR    Imaging: Dg Chest Port 1 View  Result Date: 05/01/2019 CLINICAL DATA:  Shortness of breath. EXAM: PORTABLE CHEST 1 VIEW COMPARISON:  Chest x-ray from same day at 3:54 a.m. FINDINGS: The patient remains rotated to the right.  Unchanged endotracheal and enteric tubes. Unchanged left HeRO graft. Stable cardiomegaly. Normal pulmonary vascularity. Chronic elevation of the right hemidiaphragm with right basilar atelectasis/scarring. Unchanged hazy opacity at the left lung base likely reflecting a combination of atelectasis and small left pleural effusion. No pneumothorax. No acute osseous abnormality. IMPRESSION: 1. Stable chest with bibasilar atelectasis and probable small left pleural effusion. Electronically Signed   By: Titus Dubin M.D.   On: 05/01/2019 10:20   Dg Chest Port 1 View  Result Date: 05/01/2019 CLINICAL DATA:  Acute respiratory failure. EXAM: PORTABLE CHEST 1 VIEW COMPARISON:  04/29/2019. FINDINGS: Endotracheal tube, NG tube, large caliber left subclavian line in stable position. Stable cardiomegaly. Bilateral pulmonary infiltrates/edema slightly progressed from prior exam. Bibasilar atelectasis particular prominent right lung base. Bilateral pleural effusions cannot be excluded. No pneumothorax. IMPRESSION: 1.  Lines and tubes stable position. 2. Cardiomegaly unchanged. Diffuse bilateral pulmonary infiltrates/edema slightly progressed from prior exam. Bibasilar atelectasis particular prominent the right lung base. Bilateral pleural effusions cannot be excluded. Electronically Signed   By: Marcello Moores  Register   On: 05/01/2019 06:15     Medications:   . azithromycin Stopped (05/01/19 1430)  . dexmedetomidine (PRECEDEX) IV infusion 0.7 mcg/kg/hr (05/02/19 3299)  . doxycycline (VIBRAMYCIN) IV 100 mg (05/01/19 2224)  . norepinephrine (LEVOPHED) Adult infusion 2 mcg/min (05/02/19 0009)   . allopurinol  150 mg Per Tube Once per day on Tue Thu Sat  . amitriptyline  25 mg Per Tube QHS  . atorvastatin  40 mg Per Tube Daily  . chlorhexidine gluconate (MEDLINE KIT)  15 mL Mouth Rinse BID  . Chlorhexidine Gluconate Cloth  6 each Topical Daily  . citalopram  40 mg Per Tube Daily  . clopidogrel  75 mg Per Tube Daily   . docusate  200 mg Per Tube BID  . famotidine  20 mg Per Tube Daily  . feeding supplement (PRO-STAT SUGAR FREE 64)  60 mL Per Tube QID  . feeding supplement (VITAL HIGH PROTEIN)  1,000 mL Per Tube Q24H  . fluticasone  2 spray Each Nare Daily  . heparin injection (  subcutaneous)  5,000 Units Subcutaneous Q8H  . insulin aspart  0-9 Units Subcutaneous Q4H  . loratadine  10 mg Per Tube QHS  . mouth rinse  15 mL Mouth Rinse 10 times per day  . midodrine  10 mg Oral TID WC  . multivitamin  15 mL Per Tube Daily  . sodium chloride flush  10-40 mL Intracatheter Q12H   albuterol, calcium carbonate, fentaNYL (SUBLIMAZE) injection, fentaNYL (SUBLIMAZE) injection, lidocaine-prilocaine, midazolam, nystatin, polyethylene glycol powder, polyvinyl alcohol, sodium chloride flush  Assessment/ Plan:  [name] is a 63 y.o. black female with end stage renal disease on hemodialysis, hypertension, CVA, obstructive sleep apnea, peripheral vascular disease, hypertension, gout, GERD, diabetes mellitus type II, COPD, coronary artery disease, congestive heart failure, who  was admitted to [facility] on 04/06/2019 for pneumonia. Acute respiratory failure requiring mechanical ventilation and vasopressors.   TTS [facility] left AVG   1. End stage renal disease   Complication of dialysis device, clotted AVG. Temp HD catheter placed by ICU - Continue intermittent hemodialysis. Evaluate daily for dialysis need.   - Hold UF until more hemodynamically stable - Appreciate vascular input. Thrombectomy once patient is stable.   2. Anemia of chronic kidney disease: Hemoglobin 8.7 - EPO with HD treatment TTS  3. Sepsis/Hypotension: on minimal vasopressors.Started on midodrine as well.  - Empiric antibiotics: azithromycin and doxycycline  4. Acute respiratory failure with cardiopulmonary arrest. Intubated and sedated.    LOS: 4 Sarath Kolluru 5/30/20209:48 AM

## 2019-05-02 NOTE — Progress Notes (Signed)
Video call with daughter, angela and Pt's husband. Answered several questions concerning transitioning from CRRT to I-HD, SBT and imaging to determine brain function.

## 2019-05-02 NOTE — Progress Notes (Signed)
Patient video calling into room at this time.

## 2019-05-02 NOTE — Progress Notes (Signed)
PHARMACY CONSULT NOTE - FOLLOW UP  Pharmacy Consult for Electrolyte Monitoring and Replacement   Recent Labs: Potassium (mmol/L)  Date Value  05/02/2019 3.8  03/31/2015 4.2   Magnesium (mg/dL)  Date Value  05/02/2019 1.9  06/14/2014 1.8   Calcium (mg/dL)  Date Value  05/02/2019 7.6 (L)   Calcium, Total (mg/dL)  Date Value  03/31/2015 7.0 (LL)   Albumin (g/dL)  Date Value  05/02/2019 2.3 (L)  03/31/2015 2.3 (L)   Phosphorus (mg/dL)  Date Value  05/02/2019 2.8  03/31/2015 3.6   Sodium (mmol/L)  Date Value  05/02/2019 135  03/31/2015 129 (L)     Assessment: Patient is a 63yo female admitted after cardiac arrest.    Pharmacy consulted to manage electrolytes.  Patient is ESRD, CRRT was stopped 5/30 @ 0030 .  Plan:  CRRT was stopped 5/30 @ 0030  According to RN progress note. No electrolyte replacement needed at this time.   Pharmacy will continue to monitor and replace electrolytes as needed.   Pernell Dupre, PharmD, BCPS Clinical Pharmacist 05/02/2019 5:04 AM

## 2019-05-02 NOTE — Progress Notes (Signed)
[facility] [place] NAME: [name]    MR#:  [id number]  DATE OF BIRTH:  Feb 15, 1956  SUBJECTIVE; patient critically ill, intubated, sedated.  Still on low-dose pressors, patient had temp HD catheter placed..  CHIEF COMPLAINT:   Chief Complaint  Patient presents with  . Shortness of Breath    REVIEW OF SYSTEMS:   Review of Systems  Unable to perform ROS: Critical illness     DRUG ALLERGIES:   Allergies  Allergen Reactions  . Iodine Rash  . Enalapril Other (See Comments)    TONGUE SWELLS/GOUT  . Iodinated Diagnostic Agents Other (See Comments)    BREAK OUT IN WHELPS      VITALS:  Blood pressure 125/63, pulse 84, temperature 99.6 F (37.6 C), resp. rate 12, height 5' 5.98" (1.676 m), weight (!) 138.1 kg, SpO2 94 %.  PHYSICAL EXAMINATION:  GENERAL:  63 y.o.-on the ventilator EYES: Pupils equal, round, reactive to light  No scleral icterus.  HEENT: Head atraumatic, normocephalic.  Currently intubated, sedated.  Clear.  NECK:  Supple, no jugular venous distention. No thyroid enlargement, no tenderness.  LUNGS: Diminished breath sounds bilaterally. CARDIOVASCULAR: S1, S2 normal. No murmurs, rubs, or gallops.  ABDOMEN: Soft, nontender, nondistended. Bowel sounds present. No organomegaly or mass.  EXTREMITIES: No pedal edema, cyanosis, or clubbing.  NEUROLOGIC: Unable to do full neuro exam because of intubation, sedation. PSYCHIATRIC: Intubated, sedated.    SKIN: No obvious rash, lesion, or ulcer.    LABORATORY PANEL:   CBC Recent Labs  Lab 05/02/19 0910  WBC 9.9  HGB 8.7*  HCT 27.1*  PLT 158   ------------------------------------------------------------------------------------------------------------------  Chemistries  Recent Labs  Lab 04/27/2019 1451  05/02/19 0404 05/02/19 0910  NA 137   < > 135 136  K 4.7   < > 3.8 3.9  CL 99   < > 102 102  CO2 19*   < > 26 26  GLUCOSE 248*  246*   < > 194*  193*  BUN 90*   < > 49* 55*  CREATININE 13.69*   < > 2.61* 3.15*  CALCIUM 9.0   < > 7.6* 7.8*  MG 2.2   < > 1.9  --   AST 32  --   --   --   ALT 41  --   --   --   ALKPHOS 218*  --   --   --   BILITOT 0.9  --   --   --    < > = values in this interval not displayed.   ------------------------------------------------------------------------------------------------------------------  Cardiac Enzymes Recent Labs  Lab 04/29/19 0423  TROPONINI 0.66*   ------------------------------------------------------------------------------------------------------------------  RADIOLOGY:  Dg Chest Port 1 View  Result Date: 05/01/2019 CLINICAL DATA:  Shortness of breath. EXAM: PORTABLE CHEST 1 VIEW COMPARISON:  Chest x-ray from same day at 3:54 a.m. FINDINGS: The patient remains rotated to the right. Unchanged endotracheal and enteric tubes. Unchanged left HeRO graft. Stable cardiomegaly. Normal pulmonary vascularity. Chronic elevation of the right hemidiaphragm with right basilar atelectasis/scarring. Unchanged hazy opacity at the left lung base likely reflecting a combination of atelectasis and small left pleural effusion. No pneumothorax. No acute osseous abnormality. IMPRESSION: 1. Stable chest with bibasilar atelectasis and probable small left pleural effusion. Electronically Signed   By: Titus Dubin M.D.   On: 05/01/2019 10:20   Dg Chest Port 1 View  Result Date: 05/01/2019 CLINICAL DATA:  Acute respiratory failure. EXAM: PORTABLE  CHEST 1 VIEW COMPARISON:  04/29/2019. FINDINGS: Endotracheal tube, NG tube, large caliber left subclavian line in stable position. Stable cardiomegaly. Bilateral pulmonary infiltrates/edema slightly progressed from prior exam. Bibasilar atelectasis particular prominent right lung base. Bilateral pleural effusions cannot be excluded. No pneumothorax. IMPRESSION: 1.  Lines and tubes stable position. 2. Cardiomegaly unchanged. Diffuse bilateral pulmonary infiltrates/edema  slightly progressed from prior exam. Bibasilar atelectasis particular prominent the right lung base. Bilateral pleural effusions cannot be excluded. Electronically Signed   By: Marcello Moores  Register   On: 05/01/2019 06:15    EKG:   Orders placed or performed during the hospital encounter of 04/17/2019  . EKG 12-Lead  . EKG 12-Lead    ASSESSMENT AND PLAN:   63 year old female patient with history of CVA, obstructive sleep apnea on CPAP, ESRD on hemodialysis Tuesday, Thursday, Saturday, congestive heart failure, COPD comes in because of worsening shortness of breath, she was called in the emergency room, had PEA, intubated, admitted to intensive care unit.  1 .acute respiratory failure status post PEA,  Continue ventilator support Appreciate pulmonary input  2.  Septic shock secondary to H CAP, continue doxycycline   3.  ESRD, on hemodialysis Patient taken off CRRT last night she is on intermittent hemodialysis tolerating treatment well Appreciate nephrology input  4.  Diabetes type 2 continue sliding-scale insulin  5.  Previous history of  6.  Morbid obesity     All the records are reviewed and case discussed with Care Management/Social Workerr. Management plans discussed with the patient, family and they are in agreement.  CODE STATUS: full  TOTAL TIME TAKING CARE OF THIS PATIENT: 35 minutes. \     Dustin Flock M.D on 05/02/2019 at 3:44 PM  Between 7am to 6pm - Pager - [phone]  After 6pm go to [email or web address] - password [email or web address] [facility]  Office  [phone]  CC: Primary care physician; Smithson, [name], MD   Note: This dictation was prepared with Dragon dictation along with smaller phrase technology. Any transcriptional errors that result from this process are unintentional.

## 2019-05-02 NOTE — Progress Notes (Signed)
CRRT stopped at 00:30 per Dr. Juleen China.  HD cath flushed and heparin locked, both blue and red lumens, with 1.8cc.  Pt is resting comfortably.

## 2019-05-02 NOTE — Progress Notes (Signed)
CRITICAL CARE NOTE        SUBJECTIVE FINDINGS & SIGNIFICANT EVENTS   Patient is clinically relatively the same, hemodynamics have improved, getting intermittent dialysis now Patient remains critically ill Prognosis is guarded   PAST MEDICAL HISTORY   Past Medical History:  Diagnosis Date  . Anemia   . Anxiety   . Arthritis   . Cancer [facility])    Left Kidney Cancer  . CHF (congestive heart failure) ([place])   . Chronic kidney disease   . COPD (chronic obstructive pulmonary disease) ([facility])    Use Oxygen at bedtime  . Coronary artery disease   . Diabetes mellitus without complication ([place])   . Dialysis patient ([place])    Tues, Thurs, Sat  . Dyspnea    with exertion  . GERD (gastroesophageal reflux disease)   . Gout   . History of kidney stones   . Hypertension   . Neuropathy   . Peripheral vascular disease ([place])   . Sleep apnea    OSA--USE C-PAP  . Stroke [facility]) 2004     SURGICAL HISTORY   Past Surgical History:  Procedure Laterality Date  . A/V FISTULAGRAM Left 04/26/2017   Procedure: A/V Fistulagram;  Surgeon: Katha Cabal, MD;  Location: [place] [facility];  Service: Cardiovascular;  Laterality: Left;  . ABDOMINAL HYSTERECTOMY    . CARDIAC CATHETERIZATION    . DIALYSIS/PERMA CATHETER INSERTION N/A 01/31/2017   Procedure: Dialysis/Perma Catheter Insertion;  Surgeon: Algernon Huxley, MD;  Location: [place] [facility];  Service: Cardiovascular;  Laterality: N/A;  . DIALYSIS/PERMA CATHETER INSERTION N/A 02/21/2017   Procedure: Dialysis/Perma Catheter Insertion;  Surgeon: Algernon Huxley, MD;  Location: [place] [facility];  Service: Cardiovascular;  Laterality: N/A;  . DIALYSIS/PERMA CATHETER INSERTION N/A 03/19/2017   Procedure: Dialysis/Perma Catheter Insertion;  Surgeon: Katha Cabal, MD;   Location: [place] [facility];  Service: Cardiovascular;  Laterality: N/A;  . DIALYSIS/PERMA CATHETER REMOVAL N/A 07/19/2017   Procedure: DIALYSIS/PERMA CATHETER REMOVAL;  Surgeon: Katha Cabal, MD;  Location: [place] [facility];  Service: Cardiovascular;  Laterality: N/A;  . EYE SURGERY Bilateral    Cataract Extraction with IOL  . KIDNEY SURGERY Left    Partial Nephrectomy  . LEFT HEART CATH AND CORONARY ANGIOGRAPHY N/A 04/05/2017   Procedure: Left Heart Cath and Coronary Angiography;  Surgeon: Wellington Hampshire, MD;  Location: [place] [facility];  Service: Cardiovascular;  Laterality: N/A;  . PERIPHERAL VASCULAR CATHETERIZATION N/A 01/23/2016   Procedure: Dialysis/Perma Catheter Insertion;  Surgeon: Algernon Huxley, MD;  Location: [place] [facility];  Service: Cardiovascular;  Laterality: N/A;  . PERIPHERAL VASCULAR CATHETERIZATION Left 10/01/2016   Procedure: A/V Shuntogram/Fistulagram;  Surgeon: Algernon Huxley, MD;  Location: [place] [facility];  Service: Cardiovascular;  Laterality: Left;  . PERIPHERAL VASCULAR THROMBECTOMY Left 11/24/2018   Procedure: PERIPHERAL VASCULAR THROMBECTOMY;  Surgeon: Algernon Huxley, MD;  Location: [place] [facility];  Service: Cardiovascular;  Laterality: Left;  . UPPER EXTREMITY VENOGRAPHY Bilateral 04/26/2017   Procedure: Upper Extremity Venography;  Surgeon: Katha Cabal, MD;  Location: [place] [facility];  Service: Cardiovascular;  Laterality: Bilateral;  . VASCULAR ACCESS DEVICE INSERTION Left 05/22/2017   Procedure: INSERTION OF HERO VASCULAR ACCESS DEVICE;  Surgeon: Katha Cabal, MD;  Location: [facility];  Service: Vascular;  Laterality: Left;     FAMILY HISTORY   Family History  Problem Relation Age of Onset  . Hypertension Mother   .  Heart failure Mother   . Diabetes Father   . Heart attack Father   . Lung cancer Maternal Aunt   . Cancer Maternal Aunt      SOCIAL HISTORY   Social History   Tobacco Use  . Smoking status:  Former Research scientist (life sciences)  . Smokeless tobacco: Never Used  Substance Use Topics  . Alcohol use: No  . Drug use: No     MEDICATIONS   Current Medication:  Current Facility-Administered Medications:  .  albuterol (PROVENTIL) (2.5 MG/3ML) 0.083% nebulizer solution 2.5 mg, 2.5 mg, Nebulization, Q6H PRN, Manuella Ghazi, Vipul, MD .  allopurinol (ZYLOPRIM) tablet 150 mg, 150 mg, Per Tube, Once per day on Tue Thu Sat, Shah, Vipul, MD, 150 mg at 04/30/19 1052 .  amitriptyline (ELAVIL) tablet 25 mg, 25 mg, Per Tube, QHS, Max Sane, MD, 25 mg at 05/01/19 2229 .  atorvastatin (LIPITOR) tablet 40 mg, 40 mg, Per Tube, Daily, Max Sane, MD, 40 mg at 05/01/19 2230 .  azithromycin (ZITHROMAX) 500 mg in sodium chloride 0.9 % 250 mL IVPB, 500 mg, Intravenous, Q24H, Ottie Glazier, MD, Stopped at 05/01/19 1430 .  calcium carbonate (TUMS - dosed in mg elemental calcium) chewable tablet 200 mg of elemental calcium, 1 tablet, Oral, PRN, Ouma, Bing Neighbors, NP .  chlorhexidine gluconate (MEDLINE KIT) (PERIDEX) 0.12 % solution 15 mL, 15 mL, Mouth Rinse, BID, Lanney Gins, , MD, 15 mL at 05/01/19 1944 .  Chlorhexidine Gluconate Cloth 2 % PADS 6 each, 6 each, Topical, Daily, Max Sane, MD, 6 each at 04/30/19 1234 .  citalopram (CELEXA) tablet 40 mg, 40 mg, Per Tube, Daily, Max Sane, MD, 40 mg at 05/01/19 1017 .  clopidogrel (PLAVIX) tablet 75 mg, 75 mg, Per Tube, Daily, Max Sane, MD, 75 mg at 05/01/19 1018 .  dexmedetomidine (PRECEDEX) 400 MCG/100ML (4 mcg/mL) infusion, 0.4-1.2 mcg/kg/hr, Intravenous, Titrated, , , MD, Last Rate: 21.9 mL/hr at 05/02/19 0637, 0.7 mcg/kg/hr at 05/02/19 0637 .  docusate (COLACE) 50 MG/5ML liquid 200 mg, 200 mg, Per Tube, BID, Vira Blanco, RPH, 200 mg at 05/01/19 2230 .  doxycycline (VIBRAMYCIN) 100 mg in sodium chloride 0.9 % 250 mL IVPB, 100 mg, Intravenous, Q12H, , , MD, Last Rate: 125 mL/hr at 05/01/19 2224, 100 mg at 05/01/19 2224 .  famotidine (PEPCID)  tablet 20 mg, 20 mg, Per Tube, Daily, Awilda Bill, NP, 20 mg at 05/01/19 1018 .  feeding supplement (PRO-STAT SUGAR FREE 64) liquid 60 mL, 60 mL, Per Tube, QID, , , MD, 60 mL at 05/01/19 2230 .  feeding supplement (VITAL HIGH PROTEIN) liquid 1,000 mL, 1,000 mL, Per Tube, Q24H, , , MD, 1,000 mL at 05/01/19 1325 .  fentaNYL (SUBLIMAZE) injection 50 mcg, 50 mcg, Intravenous, Q15 min PRN, Awilda Bill, NP .  fentaNYL (SUBLIMAZE) injection 50-200 mcg, 50-200 mcg, Intravenous, Q30 min PRN, Awilda Bill, NP, 100 mcg at 04/30/19 2312 .  fluticasone (FLONASE) 50 MCG/ACT nasal spray 2 spray, 2 spray, Each Nare, Daily, Ouma, Bing Neighbors, NP .  heparin injection 1,000-6,000 Units, 1,000-6,000 Units, CRRT, PRN, Kolluru, Sarath, MD, 1,000 Units at 05/02/19 0048 .  heparin injection 5,000 Units, 5,000 Units, Subcutaneous, Q8H, Awilda Bill, NP, 5,000 Units at 05/02/19 0534 .  insulin aspart (novoLOG) injection 0-9 Units, 0-9 Units, Subcutaneous, Q4H, Ottie Glazier, MD, 2 Units at 05/02/19 0405 .  lidocaine-prilocaine (EMLA) cream 1 application, 1 application, Topical, PRN, Ouma, Bing Neighbors, NP .  loratadine (CLARITIN) tablet 10 mg, 10 mg, Per Tube,  QHS, Vira Blanco, RPH, 10 mg at 05/01/19 2230 .  MEDLINE mouth rinse, 15 mL, Mouth Rinse, 10 times per day, Ottie Glazier, MD, 15 mL at 05/02/19 0532 .  midazolam (VERSED) injection 2 mg, 2 mg, Intravenous, Q2H PRN, Darel Hong D, NP, 2 mg at 05/01/19 0300 .  midodrine (PROAMATINE) tablet 10 mg, 10 mg, Oral, TID WC, , , MD, 10 mg at 05/01/19 1801 .  multivitamin liquid 15 mL, 15 mL, Per Tube, Daily, Lanney Gins, , MD, 15 mL at 05/01/19 1018 .  norepinephrine (LEVOPHED) 16 mg in dextrose 5 % 250 mL (0.064 mg/mL) infusion, 0-40 mcg/min, Intravenous, Titrated, , , MD, Last Rate: 1.88 mL/hr at 05/02/19 0009, 2 mcg/min at 05/02/19 0009 .  nystatin (MYCOSTATIN/NYSTOP) topical powder  1 g, 1 g, Topical, Daily PRN, Ouma, Bing Neighbors, NP .  polyethylene glycol powder (GLYCOLAX/MIRALAX) container 17 g, 17 g, Oral, TID PRN, Ouma, Bing Neighbors, NP .  polyvinyl alcohol (LIQUIFILM TEARS) 1.4 % ophthalmic solution 1-2 drop, 1-2 drop, Both Eyes, QID PRN, Ouma, Bing Neighbors, NP .  sodium chloride flush (NS) 0.9 % injection 10-40 mL, 10-40 mL, Intracatheter, Q12H, Ottie Glazier, MD, 10 mL at 05/01/19 2239 .  sodium chloride flush (NS) 0.9 % injection 10-40 mL, 10-40 mL, Intracatheter, PRN, Ottie Glazier, MD    ALLERGIES   Iodine; Enalapril; and Iodinated diagnostic agents    REVIEW OF SYSTEMS    Unable to obtain due to mechanical ventilation and sedation  PHYSICAL EXAMINATION   Vitals:   05/02/19 0300 05/02/19 0357  BP: (!) 108/49   Pulse: 69   Resp: 13   Temp: 99.5 F (37.5 C)   SpO2: 93% 100%    GENERAL: Sedated on mechanical ventilation HEAD: Normocephalic, atraumatic.  EYES: Pupils equal, round, reactive to light.  No scleral icterus.  MOUTH: Moist mucosal membrane. NECK: Supple. No thyromegaly. No nodules. No JVD.  PULMONARY: Bibasilar crackles CARDIOVASCULAR: S1 and S2. Regular rate and rhythm. No murmurs, rubs, or gallops.  GASTROINTESTINAL: Soft, nontender, non-distended. No masses. Positive bowel sounds. No hepatosplenomegaly.  MUSCULOSKELETAL: No swelling, clubbing, or edema.  NEUROLOGIC: Mild distress due to acute illness SKIN:intact,warm,dry   LABS AND IMAGING      LAB RESULTS: Recent Labs  Lab 05/01/19 1800 05/01/19 2209 05/02/19 0404  NA 135 135 135  K 3.4* 3.7 3.8  CL 103 102 102  CO2 _0 BUN 36* 39* 49*  CREATININE 2.20* 2.19* 2.61*  GLUCOSE 202* 204* 194*   Recent Labs  Lab 04/30/19 0500 05/01/19 0405 05/02/19 0404  HGB 11.1* 9.4* 8.7*  HCT 34.9* 29.9* 27.9*  WBC 17.9* 12.9* 9.1  PLT 142* 132* 142*     IMAGING RESULTS: Dg Chest Port 1 View  Result Date: 05/01/2019 CLINICAL DATA:  Shortness  of breath. EXAM: PORTABLE CHEST 1 VIEW COMPARISON:  Chest x-ray from same day at 3:54 a.m. FINDINGS: The patient remains rotated to the right. Unchanged endotracheal and enteric tubes. Unchanged left HeRO graft. Stable cardiomegaly. Normal pulmonary vascularity. Chronic elevation of the right hemidiaphragm with right basilar atelectasis/scarring. Unchanged hazy opacity at the left lung base likely reflecting a combination of atelectasis and small left pleural effusion. No pneumothorax. No acute osseous abnormality. IMPRESSION: 1. Stable chest with bibasilar atelectasis and probable small left pleural effusion. Electronically Signed   By: Titus Dubin M.D.   On: 05/01/2019 10:20      ASSESSMENT AND PLAN    Acutecomatose state -mental status improved now - patient  able to follow verbal communication inconsistently -continue Full MV support -continue Bronchodilator Therapy -Wean Fio2 and PEEP as tolerated -will perform SAT/SBT when respiratory parameters are met -Discussed with Husband Tommy Yzaguirre (multiple times since admission) - he is appreciative of care    Severe hyperkalemia- resolved  S/p dialysis via CRRT - HCO3-, insulin, Veltasa, Calcium gluconate ICU monitoring Nephrology on case- appreciate input pharmacy consultation - now with hypokalemia and hypophosphatemia   NSTEMI -Status post cardiac arrest and ACLS -Trending troponin 0 0.53- trending down0.91-0.66 -Empirically initiating heparin drip-patient continues to have clotting issues with dialysis catheter despite therapeutic heparin    Renal Failure-Renal failure ESRD - starting CRRT - L trialysis cath nephro on case - appreciate input -follow chem 7 -follow UO -continue Foley Catheter-assess need daily -did not take off fluid   Possible pneumonia   -Repeat CXR today yesterday chest x-ray reviewed by me with significant rotation to the right, evidence of pleural effusion  with atelectasis difficult to rule out infiltrate. -We will reduce nephrotoxic medications currently on Zithromax and doxycycline IV -Have stopped cefepime due to CNS effects of encephalopathy    NEUROLOGY - intubated and sedated - minimal sedation to achieve a RASS goal: -1 Wake up assessment pending    ID -continue IV abx as prescibed -follow up cultures  GI/Nutrition GI PROPHYLAXIS as indicated DIET-->TF's as tolerated Constipation protocol as indicated  ENDO - ICU hypoglycemic\Hyperglycemia protocol -check FSBS per protocol   ELECTROLYTES -follow labs as needed -replace as needed -pharmacy consultation   DVT/GI PRX ordered -SCDs  TRANSFUSIONS AS NEEDED MONITOR FSBS ASSESS the need for LABS as needed   Critical care provider statement:  Critical care time (minutes):33 Critical care time was exclusive of: Separately billable procedures and treating other patients Critical care was necessary to treat or prevent imminent or life-threatening deterioration of the following conditions:Circulatory shock, cardiac arrest, acute comatose state, severe hyperkalemia, AV graft fistula malfunction, fluid overload, diabetes, OSA, multiple comorbid conditions Critical care was time spent personally by me on the following activities: Development of treatment plan with patient or surrogate, discussions with consultants, evaluation of patient's response to treatment, examination of patient, obtaining history from patient or surrogate, ordering and performing treatments and interventions, ordering and review of laboratory studies and re-evaluation of patient's condition. I assumed direction of critical care for this patient from another provider in my specialty: no   This document was prepared using Dragon voice recognition software and may include unintentional dictation errors.    Ottie Glazier, M.D.  Division of [place]

## 2019-05-02 NOTE — Progress Notes (Signed)
Called [name] and gave secretary patient's daughter's phone number to call for video call visit.  [name], RN on phone currently but will call patient's daughter.

## 2019-05-03 LAB — COMPREHENSIVE METABOLIC PANEL
ALT: 14 U/L (ref 0–44)
AST: 11 U/L — ABNORMAL LOW (ref 15–41)
Albumin: 2.3 g/dL — ABNORMAL LOW (ref 3.5–5.0)
Alkaline Phosphatase: 103 U/L (ref 38–126)
Anion gap: 9 (ref 5–15)
BUN: 46 mg/dL — ABNORMAL HIGH (ref 8–23)
CO2: 27 mmol/L (ref 22–32)
Calcium: 7.9 mg/dL — ABNORMAL LOW (ref 8.9–10.3)
Chloride: 101 mmol/L (ref 98–111)
Creatinine, Ser: 3.4 mg/dL — ABNORMAL HIGH (ref 0.44–1.00)
GFR calc Af Amer: 16 mL/min — ABNORMAL LOW (ref >60)
GFR calc non Af Amer: 14 mL/min — ABNORMAL LOW (ref >60)
Glucose, Bld: 206 mg/dL — ABNORMAL HIGH (ref 70–99)
Potassium: 3.6 mmol/L (ref 3.5–5.1)
Sodium: 137 mmol/L (ref 135–145)
Total Bilirubin: 0.5 mg/dL (ref 0.3–1.2)
Total Protein: 5.8 g/dL — ABNORMAL LOW (ref 6.5–8.1)

## 2019-05-03 LAB — CBC
HCT: 26.8 % — ABNORMAL LOW (ref 36.0–46.0)
Hemoglobin: 8.3 g/dL — ABNORMAL LOW (ref 12.0–15.0)
MCH: 29.4 pg (ref 26.0–34.0)
MCHC: 31 g/dL (ref 30.0–36.0)
MCV: 95 fL (ref 80.0–100.0)
Platelets: 170 10*3/uL (ref 150–400)
RBC: 2.82 MIL/uL — ABNORMAL LOW (ref 3.87–5.11)
RDW: 17.5 % — ABNORMAL HIGH (ref 11.5–15.5)
WBC: 9.8 10*3/uL (ref 4.0–10.5)
nRBC: 2.7 % — ABNORMAL HIGH (ref 0.0–0.2)

## 2019-05-03 LAB — PROCALCITONIN: Procalcitonin: 22.74 ng/mL

## 2019-05-03 LAB — GLUCOSE, CAPILLARY
Glucose-Capillary: 171 mg/dL — ABNORMAL HIGH (ref 70–99)
Glucose-Capillary: 179 mg/dL — ABNORMAL HIGH (ref 70–99)
Glucose-Capillary: 189 mg/dL — ABNORMAL HIGH (ref 70–99)
Glucose-Capillary: 195 mg/dL — ABNORMAL HIGH (ref 70–99)
Glucose-Capillary: 225 mg/dL — ABNORMAL HIGH (ref 70–99)
Glucose-Capillary: 234 mg/dL — ABNORMAL HIGH (ref 70–99)

## 2019-05-03 LAB — PHOSPHORUS: Phosphorus: 2.4 mg/dL — ABNORMAL LOW (ref 2.5–4.6)

## 2019-05-03 LAB — MAGNESIUM: Magnesium: 1.9 mg/dL (ref 1.7–2.4)

## 2019-05-03 MED ORDER — POTASSIUM PHOSPHATES 15 MMOLE/5ML IV SOLN
15.0000 mmol | Freq: Once | INTRAVENOUS | Status: DC
  Filled 2019-05-03: qty 5

## 2019-05-03 NOTE — Progress Notes (Signed)
CRITICAL CARE NOTE        SUBJECTIVE FINDINGS & SIGNIFICANT EVENTS   Patient remains critically ill Prognosis is guarded  Ventilator management - FiO2 down to 40% with PEEP to 8, will attempt SBT today.  Cuff leak/laryngeal edema.  PAST MEDICAL HISTORY   Past Medical History:  Diagnosis Date  . Anemia   . Anxiety   . Arthritis   . Cancer [facility])    Left Kidney Cancer  . CHF (congestive heart failure) ([place])   . Chronic kidney disease   . COPD (chronic obstructive pulmonary disease) (H[facility])    Use Oxygen at bedtime  . Coronary artery disease   . Diabetes mellitus without complication ([place])   . Dialysis patient ([place])    Tues, Thurs, Sat  . Dyspnea    with exertion  . GERD (gastroesophageal reflux disease)   . Gout   . History of kidney stones   . Hypertension   . Neuropathy   . Peripheral vascular disease ([place])   . Sleep apnea    OSA--USE C-PAP  . Stroke [facility]) 2004     SURGICAL HISTORY   Past Surgical History:  Procedure Laterality Date  . A/V FISTULAGRAM Left 04/26/2017   Procedure: A/V Fistulagram;  Surgeon: Katha Cabal, MD;  Location: [place] CV [facility];  Service: Cardiovascular;  Laterality: Left;  . ABDOMINAL HYSTERECTOMY    . CARDIAC CATHETERIZATION    . DIALYSIS/PERMA CATHETER INSERTION N/A 01/31/2017   Procedure: Dialysis/Perma Catheter Insertion;  Surgeon: Algernon Huxley, MD;  Location: [place] CV LAB;  Service: Cardiovascular;  Laterality: N/A;  . DIALYSIS/PERMA CATHETER INSERTION N/A 02/21/2017   Procedure: Dialysis/Perma Catheter Insertion;  Surgeon: Algernon Huxley, MD;  Location: [place] [facility] LAB;  Service: Cardiovascular;  Laterality: N/A;  . DIALYSIS/PERMA CATHETER INSERTION N/A 03/19/2017   Procedure: Dialysis/Perma Catheter Insertion;  Surgeon: Katha Cabal, MD;   Location: [place] CV LAB;  Service: Cardiovascular;  Laterality: N/A;  . DIALYSIS/PERMA CATHETER REMOVAL N/A 07/19/2017   Procedure: DIALYSIS/PERMA CATHETER REMOVAL;  Surgeon: Katha Cabal, MD;  Location: [place] CV LAB;  Service: Cardiovascular;  Laterality: N/A;  . EYE SURGERY Bilateral    Cataract Extraction with IOL  . KIDNEY SURGERY Left    Partial Nephrectomy  . LEFT HEART CATH AND CORONARY ANGIOGRAPHY N/A 04/05/2017   Procedure: Left Heart Cath and Coronary Angiography;  Surgeon: Wellington Hampshire, MD;  Location: [place] [facility];  Service: Cardiovascular;  Laterality: N/A;  . PERIPHERAL VASCULAR CATHETERIZATION N/A 01/23/2016   Procedure: Dialysis/Perma Catheter Insertion;  Surgeon: Algernon Huxley, MD;  Location: [place] [facility];  Service: Cardiovascular;  Laterality: N/A;  . PERIPHERAL VASCULAR CATHETERIZATION Left 10/01/2016   Procedure: A/V Shuntogram/Fistulagram;  Surgeon: Algernon Huxley, MD;  Location: [place] CV LAB;  Service: Cardiovascular;  Laterality: Left;  . PERIPHERAL VASCULAR THROMBECTOMY Left 11/24/2018   Procedure: PERIPHERAL VASCULAR THROMBECTOMY;  Surgeon: Algernon Huxley, MD;  Location: [place] CV LAB;  Service: Cardiovascular;  Laterality: Left;  . UPPER EXTREMITY VENOGRAPHY Bilateral 04/26/2017   Procedure: Upper Extremity Venography;  Surgeon: Katha Cabal, MD;  Location: [name] [facility] LAB;  Service: Cardiovascular;  Laterality: Bilateral;  . VASCULAR ACCESS DEVICE INSERTION Left 05/22/2017   Procedure: INSERTION OF HERO VASCULAR ACCESS DEVICE;  Surgeon: Katha Cabal, MD;  Location: [facility] OR[facility];  Service: Vascular;  Laterality: Left;     FAMILY HISTORY   Family History  Problem Relation Age of Onset  .  Hypertension Mother   . Heart failure Mother   . Diabetes Father   . Heart attack Father   . Lung cancer Maternal Aunt   . Cancer Maternal Aunt      SOCIAL HISTORY   Social History   Tobacco Use  . Smoking status:  Former Research scientist (life sciences)  . Smokeless tobacco: Never Used  Substance Use Topics  . Alcohol use: No  . Drug use: No     MEDICATIONS   Current Medication:  Current Facility-Administered Medications:  .  albuterol (PROVENTIL) (2.5 MG/3ML) 0.083% nebulizer solution 2.5 mg, 2.5 mg, Nebulization, Q6H PRN, Manuella Ghazi, Vipul, MD .  allopurinol (ZYLOPRIM) tablet 150 mg, 150 mg, Per Tube, Once per day on Tue Thu Sat, Shah, Vipul, MD, 150 mg at 05/02/19 1303 .  amitriptyline (ELAVIL) tablet 25 mg, 25 mg, Per Tube, QHS, Max Sane, MD, 25 mg at 05/02/19 2251 .  atorvastatin (LIPITOR) tablet 40 mg, 40 mg, Per Tube, Daily, Max Sane, MD, 40 mg at 05/02/19 2251 .  azithromycin (ZITHROMAX) 500 mg in sodium chloride 0.9 % 250 mL IVPB, 500 mg, Intravenous, Q24H, Ottie Glazier, MD, Stopped at 05/02/19 1547 .  calcium carbonate (TUMS - dosed in mg elemental calcium) chewable tablet 200 mg of elemental calcium, 1 tablet, Oral, PRN, Ouma, Bing Neighbors, NP .  chlorhexidine gluconate (MEDLINE KIT) (PERIDEX) 0.12 % solution 15 mL, 15 mL, Mouth Rinse, BID, Lanney Gins, Fuad, MD, 15 mL at 05/02/19 1943 .  Chlorhexidine Gluconate Cloth 2 % PADS 6 each, 6 each, Topical, Daily, Max Sane, MD, 6 each at 05/02/19 1231 .  citalopram (CELEXA) tablet 40 mg, 40 mg, Per Tube, Daily, Max Sane, MD, 40 mg at 05/02/19 0918 .  clopidogrel (PLAVIX) tablet 75 mg, 75 mg, Per Tube, Daily, Max Sane, MD, 75 mg at 05/02/19 0918 .  dexmedetomidine (PRECEDEX) 400 MCG/100ML (4 mcg/mL) infusion, 0.4-1.2 mcg/kg/hr, Intravenous, Titrated, Aleskerov, Fuad, MD, Last Rate: 26.8 mL/hr at 05/03/19 0700, 0.8 mcg/kg/hr at 05/03/19 0700 .  docusate (COLACE) 50 MG/5ML liquid 200 mg, 200 mg, Per Tube, BID, Vira Blanco, RPH, 200 mg at 05/02/19 2252 .  doxycycline (VIBRAMYCIN) 100 mg in sodium chloride 0.9 % 250 mL IVPB, 100 mg, Intravenous, Q12H, Ottie Glazier, MD, Stopped at 05/03/19 0050 .  epoetin alfa (EPOGEN) injection 10,000 Units, 10,000 Units,  Intravenous, Q T,Th,Sa-HD, Kolluru, Sarath, MD, 10,000 Units at 05/02/19 1049 .  famotidine (PEPCID) tablet 20 mg, 20 mg, Per Tube, Daily, Awilda Bill, NP, 20 mg at 05/02/19 0919 .  feeding supplement (PRO-STAT SUGAR FREE 64) liquid 60 mL, 60 mL, Per Tube, QID, Aleskerov, Fuad, MD, 60 mL at 05/02/19 2253 .  feeding supplement (VITAL HIGH PROTEIN) liquid 1,000 mL, 1,000 mL, Per Tube, Q24H, Aleskerov, Fuad, MD, 1,000 mL at 05/02/19 1655 .  fentaNYL (SUBLIMAZE) injection 50 mcg, 50 mcg, Intravenous, Q15 min PRN, Awilda Bill, NP .  fentaNYL (SUBLIMAZE) injection 50-200 mcg, 50-200 mcg, Intravenous, Q30 min PRN, Awilda Bill, NP, 200 mcg at 05/02/19 1426 .  fluticasone (FLONASE) 50 MCG/ACT nasal spray 2 spray, 2 spray, Each Nare, Daily, Ouma, Elizabeth Achieng, NP .  heparin injection 5,000 Units, 5,000 Units, Subcutaneous, Q8H, Blakeney, Dreama Saa, NP, 5,000 Units at 05/03/19 0500 .  insulin aspart (novoLOG) injection 0-9 Units, 0-9 Units, Subcutaneous, Q4H, Aleskerov, Fuad, MD, 2 Units at 05/03/19 0438 .  lidocaine-prilocaine (EMLA) cream 1 application, 1 application, Topical, PRN, Ouma, Bing Neighbors, NP .  loratadine (CLARITIN) tablet 10 mg, 10 mg, Per Tube,  Sharrell Ku, RPH, 10 mg at 05/02/19 2251 .  MEDLINE mouth rinse, 15 mL, Mouth Rinse, 10 times per day, Ottie Glazier, MD, 15 mL at 05/03/19 9292 .  midazolam (VERSED) injection 2 mg, 2 mg, Intravenous, Q2H PRN, Darel Hong D, NP, 2 mg at 05/01/19 0300 .  midodrine (PROAMATINE) tablet 10 mg, 10 mg, Oral, TID WC, Aleskerov, Fuad, MD, 10 mg at 05/02/19 1650 .  multivitamin liquid 15 mL, 15 mL, Per Tube, Daily, Ottie Glazier, MD, 15 mL at 05/02/19 0919 .  norepinephrine (LEVOPHED) 16 mg in dextrose 5 % 250 mL (0.064 mg/mL) infusion, 0-40 mcg/min, Intravenous, Titrated, Ottie Glazier, MD, Stopped at 05/03/19 0501 .  nystatin (MYCOSTATIN/NYSTOP) topical powder 1 g, 1 g, Topical, Daily PRN, Ouma, Bing Neighbors, NP .   polyethylene glycol powder (GLYCOLAX/MIRALAX) container 17 g, 17 g, Oral, TID PRN, Ouma, Bing Neighbors, NP .  polyvinyl alcohol (LIQUIFILM TEARS) 1.4 % ophthalmic solution 1-2 drop, 1-2 drop, Both Eyes, QID PRN, Ouma, Bing Neighbors, NP .  sodium chloride flush (NS) 0.9 % injection 10-40 mL, 10-40 mL, Intracatheter, Q12H, Ottie Glazier, MD, 10 mL at 05/02/19 2253 .  sodium chloride flush (NS) 0.9 % injection 10-40 mL, 10-40 mL, Intracatheter, PRN, Ottie Glazier, MD    ALLERGIES   Iodine; Enalapril; and Iodinated diagnostic agents    REVIEW OF SYSTEMS    Unable to obtain ROS due to sedation on mechanical ventilation  PHYSICAL EXAMINATION   Vitals:   05/03/19 0630 05/03/19 0645  BP: (!) 108/50 (!) 105/47  Pulse: (!) 58 (!) 56  Resp: 20 16  Temp:    SpO2: 90% 100%    GENERAL: Dated on mechanical ventilation HEAD: Normocephalic, atraumatic.  EYES: Pupils equal, round, reactive to light.  No scleral icterus.  MOUTH: Moist mucosal membrane. NECK: Supple. No thyromegaly. No nodules. No JVD.  PULMONARY: Decreased breath sounds bilaterally CARDIOVASCULAR: S1 and S2. Regular rate and rhythm. No murmurs, rubs, or gallops.  GASTROINTESTINAL: Soft, nontender, non-distended. No masses. Positive bowel sounds. No hepatosplenomegaly.  MUSCULOSKELETAL: No swelling, clubbing, or edema.  NEUROLOGIC: Mild distress due to acute illness SKIN:intact,warm,dry   LABS AND IMAGING      LAB RESULTS: Recent Labs  Lab 05/02/19 0404 05/02/19 0910 05/03/19 0439  NA 135 136 137  K 3.8 3.9 3.6  CL 102 102 101  CO2 '26 26 27  ' BUN 49* 55* 46*  CREATININE 2.61* 3.15* 3.40*  GLUCOSE 194* 193* 206*   Recent Labs  Lab 05/02/19 0404 05/02/19 0910 05/03/19 0439  HGB 8.7* 8.7* 8.3*  HCT 27.9* 27.1* 26.8*  WBC 9.1 9.9 9.8  PLT 142* 158 170     IMAGING RESULTS: No results found.    ASSESSMENT AND PLAN     Acutecomatose state -mental status improved now - patient able  to follow verbal communication inconsistently -continue Full MV support -continue Bronchodilator Therapy -Wean Fio2 and PEEP as tolerated -will perform SAT/SBT when respiratory parameters are met -Discussed with Husband Tommy Crumpacker(multiple times since admission) - he is appreciative of care    Severe hyperkalemia- resolved  S/p dialysis via CRRT - HCO3-, insulin, Veltasa, Calcium gluconate ICU monitoring Nephrology on case- appreciate input pharmacy consultation - now with hypokalemia and hypophosphatemia   NSTEMI -Status post cardiac arrest and ACLS -Trending troponin 0 0.53- trending down0.91-0.66 -Empirically initiating heparin drip-patient continues to have clotting issues with dialysis catheter despite therapeutic heparin    Renal Failure-Renal failure ESRD - starting CRRT - L trialysis cath  nephro on case - appreciate input -follow chem 7 -follow UO -continue Foley Catheter-assess need daily -did not take off fluid   Possible pneumonia -Repeat CXR today yesterday chest x-ray reviewed by me with significant rotation to the right, evidence of pleural effusion with atelectasis difficult to rule out infiltrate. -We will reduce nephrotoxic medications currently on Zithromax and doxycycline IV -Have stopped cefepime due to CNS effects of encephalopathy    NEUROLOGY - intubated and sedated - minimal sedation to achieve a RASS goal: -1 Wake up assessment pending    ID -continue IV abx as prescibed -follow up cultures  GI/Nutrition GI PROPHYLAXIS as indicated DIET-->TF's as tolerated Constipation protocol as indicated  ENDO - ICU hypoglycemic\Hyperglycemia protocol -check FSBS per protocol   ELECTROLYTES -follow labs as needed -replace as needed -pharmacy consultation   DVT/GI PRX ordered -SCDs  TRANSFUSIONS AS NEEDED MONITOR FSBS ASSESS the need for LABS as needed   Critical care provider  statement:  Critical care time (minutes):33 Critical care time was exclusive of: Separately billable procedures and treating other patients Critical care was necessary to treat or prevent imminent or life-threatening deterioration of the following conditions:Circulatory shock, cardiac arrest, acute comatose state, severe hyperkalemia, AV graft fistula malfunction, fluid overload, diabetes, OSA, multiple comorbid conditions Critical care was time spent personally by me on the following activities: Development of treatment plan with patient or surrogate, discussions with consultants, evaluation of patient's response to treatment, examination of patient, obtaining history from patient or surrogate, ordering and performing treatments and interventions, ordering and review of laboratory studies and re-evaluation of patient's condition. I assumed direction of critical care for this patient from another provider in my specialty: no   This document was prepared using Dragon voice recognition software and may include unintentional dictation errors.   Ottie Glazier, M.D.  Division of [facility]

## 2019-05-03 NOTE — Progress Notes (Signed)
Patient failed SBT due to decreased saturations, returned to previous PRVC settings 440/ r15/ 40%/ +5 Peep.

## 2019-05-03 NOTE — Progress Notes (Addendum)
PHARMACY CONSULT NOTE - FOLLOW UP  Pharmacy Consult for Electrolyte Monitoring and Replacement   Recent Labs: Potassium (mmol/L)  Date Value  05/03/2019 3.6  03/31/2015 4.2   Magnesium (mg/dL)  Date Value  05/03/2019 1.9  06/14/2014 1.8   Calcium (mg/dL)  Date Value  05/03/2019 7.9 (L)   Calcium, Total (mg/dL)  Date Value  03/31/2015 7.0 (LL)   Albumin (g/dL)  Date Value  05/03/2019 2.3 (L)  03/31/2015 2.3 (L)   Phosphorus (mg/dL)  Date Value  05/03/2019 2.4 (L)  03/31/2015 3.6   Sodium (mmol/L)  Date Value  05/03/2019 137  03/31/2015 129 (L)     Assessment: Patient is a 63yo female admitted after cardiac arrest.    Pharmacy consulted to manage electrolytes.  Patient is ESRD, CRRT was stopped 5/30 @ 0030 . Now getting HD.  Plan:  Phos is 2.4 but will hold off on any replacement since patient has ESRD.   Pharmacy will continue to monitor and replace electrolytes as needed.   Pernell Dupre, PharmD, BCPS Clinical Pharmacist 05/03/2019 5:27 AM

## 2019-05-03 NOTE — Progress Notes (Signed)
[place] [facility] [place] NAME: [name]    MR#:  [id number]  DATE OF BIRTH:  September 11, 1956  SUBJECTIVE; patient critically ill, intubated, sedated.  Still on low-dose pressors, patient had temp HD catheter placed..  CHIEF COMPLAINT:   Chief Complaint  Patient presents with  . Shortness of Breath   Patient remains intubated   REVIEW OF SYSTEMS:   Review of Systems  Unable to perform ROS: Critical illness    DRUG ALLERGIES:   Allergies  Allergen Reactions  . Iodine Rash  . Enalapril Other (See Comments)    TONGUE SWELLS/GOUT  . Iodinated Diagnostic Agents Other (See Comments)    BREAK OUT IN WHELPS      VITALS:  Blood pressure (!) 105/47, pulse (!) 56, temperature 98.4 F (36.9 C), temperature source Oral, resp. rate 16, height 5' 5.98" (1.676 m), weight (!) 139.6 kg, SpO2 90 %.  PHYSICAL EXAMINATION:  GENERAL:  63 y.o.-on the ventilator EYES: Pupils equal, round, reactive to light  No scleral icterus.  HEENT: Head atraumatic, normocephalic.  Currently intubated, sedated.  Clear.  NECK:  Supple, no jugular venous distention. No thyroid enlargement, no tenderness.  LUNGS: Diminished breath sounds bilaterally. CARDIOVASCULAR: S1, S2 normal. No murmurs, rubs, or gallops.  ABDOMEN: Soft, nontender, nondistended. Bowel sounds present. No organomegaly or mass.  EXTREMITIES: No pedal edema, cyanosis, or clubbing.  NEUROLOGIC: Unable to do full neuro exam because of intubation, sedation. PSYCHIATRIC: Intubated, sedated.    SKIN: No obvious rash, lesion, or ulcer.    LABORATORY PANEL:   CBC Recent Labs  Lab 05/03/19 0439  WBC 9.8  HGB 8.3*  HCT 26.8*  PLT 170   ------------------------------------------------------------------------------------------------------------------  Chemistries  Recent Labs  Lab 05/03/19 0439  NA 137  K 3.6  CL 101  CO2 27  GLUCOSE 206*  BUN 46*  CREATININE 3.40*  CALCIUM 7.9*   MG 1.9  AST 11*  ALT 14  ALKPHOS 103  BILITOT 0.5   ------------------------------------------------------------------------------------------------------------------  Cardiac Enzymes Recent Labs  Lab 04/29/19 0423  TROPONINI 0.66*   ------------------------------------------------------------------------------------------------------------------  RADIOLOGY:  No results found.  EKG:   Orders placed or performed during the hospital encounter of 04/30/2019  . EKG 12-Lead  . EKG 12-Lead    ASSESSMENT AND PLAN:   63 year old female patient with history of CVA, obstructive sleep apnea on CPAP, ESRD on hemodialysis Tuesday, Thursday, Saturday, congestive heart failure, COPD comes in because of worsening shortness of breath, she was called in the emergency room, had PEA, intubated, admitted to intensive care unit.  1 .acute respiratory failure status post PEA,  Continue ventilator support Appreciate pulmonary input  2.  Septic shock secondary to HCAP, continue doxycycline, continue azithromycin   3.  ESRD, on hemodialysis Patient taken off CRRT last night she is on intermittent hemodialysis tolerating treatment well Appreciate nephrology input  4.  Diabetes type 2 continue sliding-scale insulin  5.  Morbid obesity     All the records are reviewed and case discussed with Care Management/Social Workerr. Management plans discussed with the patient, family and they are in agreement.  CODE STATUS: full  TOTAL TIME TAKING CARE OF THIS PATIENT: 35 minutes.     Dustin Flock M.D on 05/03/2019 at 2:35 PM  Between 7am to 6pm - Pager - [phone]  After 6pm go to [email or web address] - password [email or web address] [facility]  Office  [phone]  CC: Primary care physician; Smithson, [name], MD  Note: This dictation was prepared with Dragon dictation along with smaller phrase technology. Any transcriptional errors that result from this process are  unintentional.

## 2019-05-03 NOTE — Progress Notes (Signed)
[facility]  ROUNDING NOTE   Subjective:   Intermittent hemodialysis treatment yesterday. Tolerated treatment well. No ultrafiltration.   Requiring vasopressors: norepinephrine at 0.65mg/min  Objective:  Vital signs in last 24 hours:  Temp:  [98.4 F (36.9 C)-101.1 F (38.4 C)] 98.4 F (36.9 C) (05/31 0430) Pulse Rate:  [56-87] 57 (05/31 0445) Resp:  [0-26] 17 (05/31 0445) BP: (82-134)/(44-74) 119/48 (05/31 0445) SpO2:  [84 %-100 %] 94 % (05/31 0445) FiO2 (%):  [45 %-55 %] 55 % (05/31 0352) Weight:  [138.1 kg-139.6 kg] 139.6 kg (05/31 0242)  Weight change: -3.1 kg Filed Weights   05/02/19 0500 05/02/19 0845 05/03/19 0242  Weight: (!) 141.2 kg (!) 138.1 kg (!) 139.6 kg    Intake/Output: I/O last 3 completed shifts: In: 4408.7 [I.V.:1318.7; NG/GT:840; IV Piggyback:2250] Out: -282    Intake/Output this shift:  Total I/O In: 872.9 [I.V.:353.1; NG/GT:270; IV Piggyback:249.8] Out: 0   Physical Exam: General: Critically ill   Head: ETT, OGT  Eyes: Eyes closed  Neck: obese  Lungs:  PRVC 55%  Heart: Regular rate and rhythm  Abdomen:  Soft, nontender, obese  Extremities:  + peripheral edema.  Neurologic: Intubated and sedated  Skin: No lesions  Access: +thrombosed left arm AVG, temp femoral right HD catheter Dr. ALanney Gins   Basic Metabolic Panel: Recent Labs  Lab 05/01/19 1345 05/01/19 1800 05/01/19 2209 05/02/19 0404 05/02/19 0910 05/03/19 0439  NA 135 135 135 135 136 137  K 3.1* 3.4* 3.7 3.8 3.9 3.6  CL 104 103 102 102 102 101  CO2 '26 26 27 26 26 27  ' GLUCOSE 202* 202* 204* 194* 193* 206*  BUN 36* 36* 39* 49* 55* 46*  CREATININE 2.27* 2.20* 2.19* 2.61* 3.15* 3.40*  CALCIUM 7.3* 7.6* 8.0* 7.6* 7.8* 7.9*  MG 1.8 1.7 1.9 1.9  --  1.9  PHOS 1.8* 2.4* 2.6 2.8 2.5 2.4*    Liver Function Tests: Recent Labs  Lab 04/13/2019 0731 04/27/2019 1451  05/01/19 1800 05/01/19 2209 05/02/19 0404 05/02/19 0910 05/03/19 0439  AST 13* 32  --   --   --    --   --  11*  ALT 16 41  --   --   --   --   --  14  ALKPHOS 206* 218*  --   --   --   --   --  103  BILITOT 0.9 0.9  --   --   --   --   --  0.5  PROT 7.7 7.1  --   --   --   --   --  5.8*  ALBUMIN 3.6 3.3*   < > 2.1* 2.2* 2.3* 2.2* 2.3*   < > = values in this interval not displayed.   Recent Labs  Lab 04/08/2019 0731  LIPASE 59*   No results for input(s): AMMONIA in the last 168 hours.  CBC: Recent Labs  Lab 04/03/2019 0731 04/24/2019 1451  04/30/19 0500 05/01/19 0405 05/02/19 0404 05/02/19 0910 05/03/19 0439  WBC 8.2 17.7*   < > 17.9* 12.9* 9.1 9.9 9.8  NEUTROABS 6.0 14.7*  --  15.1* 9.5* 6.5  --   --   HGB 12.1 13.9   < > 11.1* 9.4* 8.7* 8.7* 8.3*  HCT 37.2 42.6   < > 34.9* 29.9* 27.9* 27.1* 26.8*  MCV 90.1 90.3   < > 91.6 92.9 94.3 92.5 95.0  PLT 229 272   < > 142* 132* 142* 158  170   < > = values in this interval not displayed.    Cardiac Enzymes: Recent Labs  Lab 05/02/2019 1451 04/04/2019 2139 04/29/19 0423  TROPONINI 0.53* 0.91* 0.66*    BNP: Invalid input(s): POCBNP  CBG: Recent Labs  Lab 05/02/19 1136 05/02/19 1548 05/02/19 1931 05/02/19 2340 05/03/19 0407  GLUCAP 132* 154* 169* 227* 171*    Microbiology: Results for orders placed or performed during the hospital encounter of 04/25/2019  SARS Coronavirus 2 (CEPHEID - Performed in [place] hospital lab), Hosp Order     Status: None   Collection Time: 04/29/2019  7:31 AM  Result Value Ref Range Status   SARS Coronavirus 2 NEGATIVE NEGATIVE Final    Comment: (NOTE) If result is NEGATIVE SARS-CoV-2 target nucleic acids are NOT DETECTED. The SARS-CoV-2 RNA is generally detectable in upper and lower  respiratory specimens during the acute phase of infection. The lowest  concentration of SARS-CoV-2 viral copies this assay can detect is 250  copies / mL. A negative result does not preclude SARS-CoV-2 infection  and should not be used as the sole basis for treatment or other  patient management decisions.   A negative result may occur with  improper specimen collection / handling, submission of specimen other  than nasopharyngeal swab, presence of viral mutation(s) within the  areas targeted by this assay, and inadequate number of viral copies  (<250 copies / mL). A negative result must be combined with clinical  observations, patient history, and epidemiological information. If result is POSITIVE SARS-CoV-2 target nucleic acids are DETECTED. The SARS-CoV-2 RNA is generally detectable in upper and lower  respiratory specimens dur ing the acute phase of infection.  Positive  results are indicative of active infection with SARS-CoV-2.  Clinical  correlation with patient history and other diagnostic information is  necessary to determine patient infection status.  Positive results do  not rule out bacterial infection or co-infection with other viruses. If result is PRESUMPTIVE POSTIVE SARS-CoV-2 nucleic acids MAY BE PRESENT.   A presumptive positive result was obtained on the submitted specimen  and confirmed on repeat testing.  While 2019 novel coronavirus  (SARS-CoV-2) nucleic acids may be present in the submitted sample  additional confirmatory testing may be necessary for epidemiological  and / or clinical management purposes  to differentiate between  SARS-CoV-2 and other Sarbecovirus currently known to infect humans.  If clinically indicated additional testing with an alternate test  methodology [phone]) is advised. The SARS-CoV-2 RNA is generally  detectable in upper and lower respiratory sp ecimens during the acute  phase of infection. The expected result is Negative. Fact Sheet for Patients:  StrictlyIdeas.no Fact Sheet for Healthcare Providers: BankingDealers.co.za This test is not yet approved or cleared by the [place] FDA and has been authorized for detection and/or diagnosis of SARS-CoV-2 by FDA under an Emergency Use  Authorization (EUA).  This EUA will remain in effect (meaning this test can be used) for the duration of the COVID-19 declaration under Section 564(b)(1) of the Act, 21 U.S.C. section 360bbb-3(b)(1), unless the authorization is terminated or revoked sooner. Performed at [facility], [address] [place], [place] [address]   MRSA PCR Screening     Status: None   Collection Time: 04/19/2019  9:04 AM  Result Value Ref Range Status   MRSA by PCR NEGATIVE NEGATIVE Final    Comment:        The GeneXpert MRSA Assay (FDA approved for NASAL specimens only), is one  component of a comprehensive MRSA colonization surveillance program. It is not intended to diagnose MRSA infection nor to guide or monitor treatment for MRSA infections. Performed at [facility], [address] [place], [place] [address]   Culture, respiratory (non-expectorated)     Status: None (Preliminary result)   Collection Time: 05/01/19 11:05 AM  Result Value Ref Range Status   Specimen Description   Final    TRACHEAL ASPIRATE Performed at [facility], [address], [place], [place] [address]    Special Requests   Final    NONE Performed at [facility], [address] [place], [place] [address]    Gram Stain   Final    MODERATE WBC PRESENT, PREDOMINANTLY PMN RARE YEAST Performed at [facility], [place] [address] [place], [place] [address]    Culture FEW YEAST  Final   Report Status PENDING  Incomplete    Coagulation Studies: No results for input(s): LABPROT, INR in the last 72 hours.  Urinalysis: No results for input(s): COLORURINE, LABSPEC, PHURINE, GLUCOSEU, HGBUR, BILIRUBINUR, KETONESUR, PROTEINUR, UROBILINOGEN, NITRITE, LEUKOCYTESUR in the last 72 hours.  Invalid input(s): APPERANCEUR    Imaging: Dg Chest Port 1 View  Result Date: 05/01/2019 CLINICAL DATA:  Shortness of breath. EXAM: PORTABLE CHEST 1 VIEW COMPARISON:  Chest x-ray from same day at  3:54 a.m. FINDINGS: The patient remains rotated to the right. Unchanged endotracheal and enteric tubes. Unchanged left HeRO graft. Stable cardiomegaly. Normal pulmonary vascularity. Chronic elevation of the right hemidiaphragm with right basilar atelectasis/scarring. Unchanged hazy opacity at the left lung base likely reflecting a combination of atelectasis and small left pleural effusion. No pneumothorax. No acute osseous abnormality. IMPRESSION: 1. Stable chest with bibasilar atelectasis and probable small left pleural effusion. Electronically Signed   By: Titus Dubin M.D.   On: 05/01/2019 10:20     Medications:   . azithromycin Stopped (05/02/19 1547)  . dexmedetomidine (PRECEDEX) IV infusion 1 mcg/kg/hr (05/03/19 0400)  . doxycycline (VIBRAMYCIN) IV Stopped (05/03/19 0050)  . norepinephrine (LEVOPHED) Adult infusion 0.5 mcg/min (05/03/19 0400)  . potassium PHOSPHATE IVPB (in mmol)     . allopurinol  150 mg Per Tube Once per day on Tue Thu Sat  . amitriptyline  25 mg Per Tube QHS  . atorvastatin  40 mg Per Tube Daily  . chlorhexidine gluconate (MEDLINE KIT)  15 mL Mouth Rinse BID  . Chlorhexidine Gluconate Cloth  6 each Topical Daily  . citalopram  40 mg Per Tube Daily  . clopidogrel  75 mg Per Tube Daily  . docusate  200 mg Per Tube BID  . epoetin (EPOGEN/PROCRIT) injection  10,000 Units Intravenous Q T,Th,Sa-HD  . famotidine  20 mg Per Tube Daily  . feeding supplement (PRO-STAT SUGAR FREE 64)  60 mL Per Tube QID  . feeding supplement (VITAL HIGH PROTEIN)  1,000 mL Per Tube Q24H  . fluticasone  2 spray Each Nare Daily  . heparin injection (subcutaneous)  5,000 Units Subcutaneous Q8H  . insulin aspart  0-9 Units Subcutaneous Q4H  . loratadine  10 mg Per Tube QHS  . mouth rinse  15 mL Mouth Rinse 10 times per day  . midodrine  10 mg Oral TID WC  . multivitamin  15 mL Per Tube Daily  . sodium chloride flush  10-40 mL Intracatheter Q12H   albuterol, calcium carbonate, fentaNYL  (SUBLIMAZE) injection, fentaNYL (SUBLIMAZE) injection, lidocaine-prilocaine, midazolam, nystatin, polyethylene glycol powder, polyvinyl alcohol, sodium chloride flush  Assessment/  Plan:  Ms. [name] is a 63 y.o. black female with end stage renal disease on hemodialysis, hypertension, CVA, obstructive sleep apnea, peripheral vascular disease, hypertension, gout, GERD, diabetes mellitus type II, COPD, coronary artery disease, congestive heart failure, who  was admitted to [facility] on 04/26/2019 for pneumonia. Acute respiratory failure requiring mechanical ventilation and vasopressors.   TTS [facility] left AVG   1. End stage renal disease   Complication of dialysis device, clotted AVG. Temp HD catheter placed by ICU - Continue intermittent hemodialysis. Evaluate daily for dialysis need. Last dialysis was yesterday, Saturday 5/30. No indication for dialysis today.    - Hold UF until more hemodynamically stable - Appreciate vascular input. Thrombectomy once patient is stable.   2. Anemia of chronic kidney disease: Hemoglobin 8.3 - EPO with HD treatment TTS  3. Sepsis/Hypotension: on minimal vasopressors.Started on midodrine as well.  - Empiric antibiotics: azithromycin and doxycycline - Continue to wean norepinephrine.   4. Acute respiratory failure with cardiopulmonary arrest. Intubated and sedated.    LOS: 5 Sarath Kolluru 5/31/20205:27 AM

## 2019-05-04 ENCOUNTER — Inpatient Hospital Stay: Payer: Medicare Other

## 2019-05-04 LAB — MAGNESIUM: Magnesium: 2 mg/dL (ref 1.7–2.4)

## 2019-05-04 LAB — CULTURE, RESPIRATORY W GRAM STAIN

## 2019-05-04 LAB — HEPATITIS B SURFACE ANTIBODY, QUANTITATIVE: Hep B S AB Quant (Post): 7.5 m[IU]/mL — ABNORMAL LOW (ref >9.9)

## 2019-05-04 LAB — HEPATITIS B SURFACE ANTIGEN: Hepatitis B Surface Ag: NEGATIVE

## 2019-05-04 LAB — GLUCOSE, CAPILLARY
Glucose-Capillary: 203 mg/dL — ABNORMAL HIGH (ref 70–99)
Glucose-Capillary: 201 mg/dL — ABNORMAL HIGH (ref 70–99)
Glucose-Capillary: 26 mg/dL — CL (ref 70–99)
Glucose-Capillary: 205 mg/dL — ABNORMAL HIGH (ref 70–99)
Glucose-Capillary: 163 mg/dL — ABNORMAL HIGH (ref 70–99)
Glucose-Capillary: 139 mg/dL — ABNORMAL HIGH (ref 70–99)

## 2019-05-04 LAB — BASIC METABOLIC PANEL
Anion gap: 9 (ref 5–15)
BUN: 75 mg/dL — ABNORMAL HIGH (ref 8–23)
CO2: 25 mmol/L (ref 22–32)
Calcium: 7.9 mg/dL — ABNORMAL LOW (ref 8.9–10.3)
Chloride: 102 mmol/L (ref 98–111)
Creatinine, Ser: 4.9 mg/dL — ABNORMAL HIGH (ref 0.44–1.00)
GFR calc Af Amer: 10 mL/min — ABNORMAL LOW (ref >60)
GFR calc non Af Amer: 9 mL/min — ABNORMAL LOW (ref >60)
Glucose, Bld: 222 mg/dL — ABNORMAL HIGH (ref 70–99)
Potassium: 3.7 mmol/L (ref 3.5–5.1)
Sodium: 136 mmol/L (ref 135–145)

## 2019-05-04 LAB — PROCALCITONIN: Procalcitonin: 18.57 ng/mL

## 2019-05-04 LAB — PATHOLOGIST SMEAR REVIEW

## 2019-05-04 MED ORDER — INSULIN GLARGINE 100 UNIT/ML ~~LOC~~ SOLN
5.0000 [IU] | Freq: Every day | SUBCUTANEOUS | Status: DC
  Administered 2019-05-04 – 2019-05-12 (×9): 5 [IU] via SUBCUTANEOUS
  Filled 2019-05-04 (×12): qty 0.05

## 2019-05-04 MED ORDER — INSULIN ASPART 100 UNIT/ML ~~LOC~~ SOLN
3.0000 [IU] | SUBCUTANEOUS | Status: DC
  Administered 2019-05-04 – 2019-05-13 (×38): 3 [IU] via SUBCUTANEOUS
  Filled 2019-05-04 (×38): qty 1

## 2019-05-04 MED ORDER — ALBUMIN HUMAN 25 % IV SOLN
25.0000 g | Freq: Once | INTRAVENOUS | Status: AC
  Administered 2019-05-05: 25 g via INTRAVENOUS
  Filled 2019-05-04: qty 100

## 2019-05-04 MED ORDER — SENNA 8.6 MG PO TABS
1.0000 | ORAL_TABLET | Freq: Every day | ORAL | Status: DC
  Administered 2019-05-04 – 2019-05-07 (×4): 8.6 mg via ORAL
  Filled 2019-05-04 (×4): qty 1

## 2019-05-04 NOTE — Progress Notes (Signed)
[facility]  ROUNDING NOTE   Subjective:   Remains critically ill, intubated, sedated Vent: Fio2 45% Able to follow simple commands  Objective:  Vital signs in last 24 hours:  Temp:  [97.4 F (36.3 C)-98.6 F (37 C)] 97.8 F (36.6 C) (06/01 0745) Pulse Rate:  [59-72] 62 (06/01 0600) Resp:  [9-24] 21 (06/01 0600) BP: (93-134)/(47-91) 93/52 (06/01 0600) SpO2:  [62 %-93 %] 91 % (06/01 0721) FiO2 (%):  [40 %-45 %] 45 % (06/01 0721)  Weight change:  Filed Weights   05/02/19 0500 05/02/19 0845 05/03/19 0242  Weight: (!) 141.2 kg (!) 138.1 kg (!) 139.6 kg    Intake/Output: I/O last 3 completed shifts: In: 2354 [I.V.:1064.2; NG/GT:790; IV Piggyback:499.8] Out: 0    Intake/Output this shift:  No intake/output data recorded.  Physical Exam: General: Critically ill   Head: ETT, OGT  Eyes: Eyes closed  Neck: obese  Lungs:  PRVC 45%, coarse breath sounds  Heart: Regular rate and rhythm  Abdomen:  Soft, nontender, obese  Extremities:  + peripheral edema.  Neurologic: Intubated and sedated  Skin: No lesions  Access: +thrombosed left arm AVG, temp femoral LEFT  HD catheter Dr. Lanney Gins    Basic Metabolic Panel: Recent Labs  Lab 05/01/19 1800 05/01/19 2209 05/02/19 0404 05/02/19 0910 05/03/19 0439 05/04/19 0436  NA 135 135 135 136 137 136  K 3.4* 3.7 3.8 3.9 3.6 3.7  CL 103 102 102 102 101 102  CO2 _0 GLUCOSE 202* 204* 194* 193* 206* 222*  BUN 36* 39* 49* 55* 46* 75*  CREATININE 2.20* 2.19* 2.61* 3.15* 3.40* 4.90*  CALCIUM 7.6* 8.0* 7.6* 7.8* 7.9* 7.9*  MG 1.7 1.9 1.9  --  1.9 2.0  PHOS 2.4* 2.6 2.8 2.5 2.4*  --     Liver Function Tests: Recent Labs  Lab 04/10/2019 0731 04/06/2019 1451  05/01/19 1800 05/01/19 2209 05/02/19 0404 05/02/19 0910 05/03/19 0439  AST 13* 32  --   --   --   --   --  11*  ALT 16 41  --   --   --   --   --  14  ALKPHOS 206* 218*  --   --   --   --   --  103  BILITOT 0.9 0.9  --   --   --   --   --   0.5  PROT 7.7 7.1  --   --   --   --   --  5.8*  ALBUMIN 3.6 3.3*   < > 2.1* 2.2* 2.3* 2.2* 2.3*   < > = values in this interval not displayed.   Recent Labs  Lab 04/06/2019 0731  LIPASE 59*   No results for input(s): AMMONIA in the last 168 hours.  CBC: Recent Labs  Lab 04/05/2019 0731 04/27/2019 1451  04/30/19 0500 05/01/19 0405 05/02/19 0404 05/02/19 0910 05/03/19 0439  WBC 8.2 17.7*   < > 17.9* 12.9* 9.1 9.9 9.8  NEUTROABS 6.0 14.7*  --  15.1* 9.5* 6.5  --   --   HGB 12.1 13.9   < > 11.1* 9.4* 8.7* 8.7* 8.3*  HCT 37.2 42.6   < > 34.9* 29.9* 27.9* 27.1* 26.8*  MCV 90.1 90.3   < > 91.6 92.9 94.3 92.5 95.0  PLT 229 272   < > 142* 132* 142* 158 170   < > = values in this interval not displayed.  Cardiac Enzymes: Recent Labs  Lab 04/16/2019 1451 04/08/2019 2139 04/29/19 0423  TROPONINI 0.53* 0.91* 0.66*    BNP: Invalid input(s): POCBNP  CBG: Recent Labs  Lab 05/03/19 1547 05/03/19 1933 05/03/19 2346 05/04/19 0446 05/04/19 0754  GLUCAP 195* 234* 225* 203* 201*    Microbiology: Results for orders placed or performed during the hospital encounter of 05/01/2019  SARS Coronavirus 2 (CEPHEID - Performed in [place] hospital lab), Hosp Order     Status: None   Collection Time: 04/27/2019  7:31 AM  Result Value Ref Range Status   SARS Coronavirus 2 NEGATIVE NEGATIVE Final    Comment: (NOTE) If result is NEGATIVE SARS-CoV-2 target nucleic acids are NOT DETECTED. The SARS-CoV-2 RNA is generally detectable in upper and lower  respiratory specimens during the acute phase of infection. The lowest  concentration of SARS-CoV-2 viral copies this assay can detect is 250  copies / mL. A negative result does not preclude SARS-CoV-2 infection  and should not be used as the sole basis for treatment or other  patient management decisions.  A negative result may occur with  improper specimen collection / handling, submission of specimen other  than nasopharyngeal swab, presence  of viral mutation(s) within the  areas targeted by this assay, and inadequate number of viral copies  (<250 copies / mL). A negative result must be combined with clinical  observations, patient history, and epidemiological information. If result is POSITIVE SARS-CoV-2 target nucleic acids are DETECTED. The SARS-CoV-2 RNA is generally detectable in upper and lower  respiratory specimens dur ing the acute phase of infection.  Positive  results are indicative of active infection with SARS-CoV-2.  Clinical  correlation with patient history and other diagnostic information is  necessary to determine patient infection status.  Positive results do  not rule out bacterial infection or co-infection with other viruses. If result is PRESUMPTIVE POSTIVE SARS-CoV-2 nucleic acids MAY BE PRESENT.   A presumptive positive result was obtained on the submitted specimen  and confirmed on repeat testing.  While 2019 novel coronavirus  (SARS-CoV-2) nucleic acids may be present in the submitted sample  additional confirmatory testing may be necessary for epidemiological  and / or clinical management purposes  to differentiate between  SARS-CoV-2 and other Sarbecovirus currently known to infect humans.  If clinically indicated additional testing with an alternate test  methodology [phone]) is advised. The SARS-CoV-2 RNA is generally  detectable in upper and lower respiratory sp ecimens during the acute  phase of infection. The expected result is Negative. Fact Sheet for Patients:  StrictlyIdeas.no Fact Sheet for Healthcare Providers: BankingDealers.co.za This test is not yet approved or cleared by the [place] FDA and has been authorized for detection and/or diagnosis of SARS-CoV-2 by FDA under an Emergency Use Authorization (EUA).  This EUA will remain in effect (meaning this test can be used) for the duration of the COVID-19 declaration under Section  564(b)(1) of the Act, 21 U.S.C. section 360bbb-3(b)(1), unless the authorization is terminated or revoked sooner. Performed at [facility], [address] [place], [place] [address]   MRSA PCR Screening     Status: None   Collection Time: 04/07/2019  9:04 AM  Result Value Ref Range Status   MRSA by PCR NEGATIVE NEGATIVE Final    Comment:        The GeneXpert MRSA Assay (FDA approved for NASAL specimens only), is one component of a comprehensive MRSA colonization surveillance program. It is not intended to diagnose MRSA  infection nor to guide or monitor treatment for MRSA infections. Performed at [facility], [address] [place], [place] [address]   Culture, respiratory (non-expectorated)     Status: None (Preliminary result)   Collection Time: 05/01/19 11:05 AM  Result Value Ref Range Status   Specimen Description   Final    TRACHEAL ASPIRATE Performed at [facility], [address], [place], [place] [address]    Special Requests   Final    NONE Performed at [facility], [address] [place], [place] [address]    Gram Stain   Final    MODERATE WBC PRESENT, PREDOMINANTLY PMN RARE YEAST    Culture   Final    FEW YEAST IDENTIFICATION TO FOLLOW Performed at [facility], [place] [address] [place], [place] [address]    Report Status PENDING  Incomplete    Coagulation Studies: No results for input(s): LABPROT, INR in the last 72 hours.  Urinalysis: No results for input(s): COLORURINE, LABSPEC, PHURINE, GLUCOSEU, HGBUR, BILIRUBINUR, KETONESUR, PROTEINUR, UROBILINOGEN, NITRITE, LEUKOCYTESUR in the last 72 hours.  Invalid input(s): APPERANCEUR    Imaging: Dg Chest Port 1 View  Result Date: 05/04/2019 CLINICAL DATA:  Acute respiratory failure EXAM: PORTABLE CHEST 1 VIEW COMPARISON:  05/01/2019 FINDINGS: Endotracheal tube, gastric catheter and hero graft are again noted and stable. Cardiac shadow is stable. Right upper  lobe atelectasis is noted along the minor fissure new from the prior exam. Some improved aeration in right base is noted when compare with the prior exam. Mild vascular congestion is noted. Small left effusion is noted. IMPRESSION: Improved aeration in the right base although some right upper lobe atelectasis is noted. Mild vascular congestion. Electronically Signed   By: Inez Catalina M.D.   On: 05/04/2019 07:17     Medications:   . azithromycin 500 mg (05/03/19 1239)  . dexmedetomidine (PRECEDEX) IV infusion 0.8 mcg/kg/hr (05/04/19 0807)  . doxycycline (VIBRAMYCIN) IV 100 mg (05/03/19 2246)  . norepinephrine (LEVOPHED) Adult infusion Stopped (05/03/19 0501)   . allopurinol  150 mg Per Tube Once per day on Tue Thu Sat  . amitriptyline  25 mg Per Tube QHS  . atorvastatin  40 mg Per Tube Daily  . chlorhexidine gluconate (MEDLINE KIT)  15 mL Mouth Rinse BID  . Chlorhexidine Gluconate Cloth  6 each Topical Daily  . citalopram  40 mg Per Tube Daily  . clopidogrel  75 mg Per Tube Daily  . docusate  200 mg Per Tube BID  . epoetin (EPOGEN/PROCRIT) injection  10,000 Units Intravenous Q T,Th,Sa-HD  . famotidine  20 mg Per Tube Daily  . feeding supplement (PRO-STAT SUGAR FREE 64)  60 mL Per Tube QID  . feeding supplement (VITAL HIGH PROTEIN)  1,000 mL Per Tube Q24H  . fluticasone  2 spray Each Nare Daily  . heparin injection (subcutaneous)  5,000 Units Subcutaneous Q8H  . insulin aspart  0-9 Units Subcutaneous Q4H  . loratadine  10 mg Per Tube QHS  . mouth rinse  15 mL Mouth Rinse 10 times per day  . midodrine  10 mg Oral TID WC  . multivitamin  15 mL Per Tube Daily  . sodium chloride flush  10-40 mL Intracatheter Q12H   albuterol, calcium carbonate, fentaNYL (SUBLIMAZE) injection, fentaNYL (SUBLIMAZE) injection, lidocaine-prilocaine, midazolam, nystatin, polyethylene glycol powder, polyvinyl alcohol, sodium chloride flush  Assessment/ Plan:  [name] is a 63 y.o. black female  with end stage renal disease on  hemodialysis, hypertension, CVA, obstructive sleep apnea, peripheral vascular disease, hypertension, gout, GERD, diabetes mellitus type II, COPD, coronary artery disease, congestive heart failure, who  was admitted to [facility] on 04/16/2019 for pneumonia. Acute respiratory failure requiring mechanical ventilation and vasopressors.   TTS [facility] left AVG   1. End stage renal disease  With generalized edema Complication of dialysis device, clotted AVG. Temp HD catheter placed by ICU - Continue intermittent hemodialysis. Evaluate daily for dialysis need.  - Extra HD today for UF as tolerated   - Appreciate vascular input. Thrombectomy once patient is stable.   2. Anemia of chronic kidney disease:  Lab Results  Component Value Date   HGB 8.3 (L) 05/03/2019   - EPO with HD treatment TTS  3. Sepsis/Hypotension:  - off iv pressors - Empiric antibiotics: azithromycin and doxycycline - Continue to wean norepinephrine.   4. Acute respiratory failure with cardiopulmonary arrest.  Intubated and sedated.  - able to follow simple commands this morning - Tube feeds at 20 cc/hr   LOS: 6 Harmeet Singh 6/1/20209:10 AM

## 2019-05-04 NOTE — Progress Notes (Signed)
Pt started out maintaining SBT 8 over 5 with no issues. After an hour pts spo2 dropped to the 70's, she was able to come back up to the upper 80's but soon dropped again. MD notified and vent settings changed back to [facility]. Will continue to monitor.

## 2019-05-04 NOTE — Progress Notes (Signed)
[facility] NAME: [name]    MR#:  [id number]  DATE OF BIRTH:  [birth year]  SUBJECTIVE; patient critically ill, intubated, sedated.  Still on low-dose pressors, patient had temp HD catheter placed..  CHIEF COMPLAINT:   Chief Complaint  Patient presents with  . Shortness of Breath   Patient remains intubated, on Levophed drip   REVIEW OF SYSTEMS:   Review of Systems  Unable to perform ROS: Critical illness    DRUG ALLERGIES:   Allergies  Allergen Reactions  . Iodine Rash  . Enalapril Other (See Comments)    TONGUE SWELLS/GOUT  . Iodinated Diagnostic Agents Other (See Comments)    BREAK OUT IN WHELPS      VITALS:  Blood pressure 126/60, pulse 65, temperature 97.8 F (36.6 C), temperature source Oral, resp. rate (!) 8, height 5' 5.98" (1.676 m), weight (!) 139.6 kg, SpO2 93 %.  PHYSICAL EXAMINATION:  GENERAL:  63 y.o.-on the ventilator EYES: Pupils equal, round, reactive to light  No scleral icterus.  HEENT: Head atraumatic, normocephalic.  Currently intubated, sedated.  Clear.  NECK:  Supple, no jugular venous distention. No thyroid enlargement, no tenderness.  LUNGS: Diminished breath sounds bilaterally. CARDIOVASCULAR: S1, S2 normal. No murmurs, rubs, or gallops.  ABDOMEN: Soft, nontender, nondistended. Bowel sounds present. No organomegaly or mass.  EXTREMITIES: No pedal edema, cyanosis, or clubbing.  NEUROLOGIC: Unable to do full neuro exam because of intubation, sedation. PSYCHIATRIC: Intubated, sedated.    SKIN: No obvious rash, lesion, or ulcer.    LABORATORY PANEL:   CBC Recent Labs  Lab 05/03/19 0439  WBC 9.8  HGB 8.3*  HCT 26.8*  PLT 170   ------------------------------------------------------------------------------------------------------------------  Chemistries  Recent Labs  Lab 05/03/19 0439 05/04/19 0436  NA 137 136  K 3.6 3.7  CL 101 102  CO2 27 25  GLUCOSE 206*  222*  BUN 46* 75*  CREATININE 3.40* 4.90*  CALCIUM 7.9* 7.9*  MG 1.9 2.0  AST 11*  --   ALT 14  --   ALKPHOS 103  --   BILITOT 0.5  --    ------------------------------------------------------------------------------------------------------------------  Cardiac Enzymes Recent Labs  Lab 04/29/19 0423  TROPONINI 0.66*   ------------------------------------------------------------------------------------------------------------------  RADIOLOGY:  Dg Chest Port 1 View  Result Date: 05/04/2019 CLINICAL DATA:  Acute respiratory failure EXAM: PORTABLE CHEST 1 VIEW COMPARISON:  05/01/2019 FINDINGS: Endotracheal tube, gastric catheter and hero graft are again noted and stable. Cardiac shadow is stable. Right upper lobe atelectasis is noted along the minor fissure new from the prior exam. Some improved aeration in right base is noted when compare with the prior exam. Mild vascular congestion is noted. Small left effusion is noted. IMPRESSION: Improved aeration in the right base although some right upper lobe atelectasis is noted. Mild vascular congestion. Electronically Signed   By: Inez Catalina M.D.   On: 05/04/2019 07:17    EKG:   Orders placed or performed during the hospital encounter of 04/16/2019  . EKG 12-Lead  . EKG 12-Lead    ASSESSMENT AND PLAN:   63 year old female patient with history of CVA, obstructive sleep apnea on CPAP, ESRD on hemodialysis Tuesday, Thursday, Saturday, congestive heart failure, COPD comes in because of worsening shortness of breath, she was called in the emergency room, had PEA, intubated, admitted to intensive care unit.  1 .acute respiratory failure status post PEA,  Continue ventilator support Appreciate pulmonary input Try to wean off ventilator possible  2.  Septic shock secondary to HCAP, continue doxycycline, continue azithromycin and doxycycline   3.  ESRD, on hemodialysis Hemodialysis as per nephrology   4.  Diabetes type 2 continue  sliding-scale insulin  5.  Morbid obesity  Poor prognosis   All the records are reviewed and case discussed with Care Management/Social Workerr. Management plans discussed with the patient, family and they are in agreement.  CODE STATUS: full  TOTAL TIME TAKING CARE OF THIS PATIENT: 35 minutes.     Dustin Flock M.D on 05/04/2019 at 2:18 PM  Between 7am to 6pm - Pager - [phone]  After 6pm go to [email or web address] - password [email or web address] [facility]  Office  [phone]  CC: Primary care physician; Smithson, [name], MD   Note: This dictation was prepared with Dragon dictation along with smaller phrase technology. Any transcriptional errors that result from this process are unintentional.

## 2019-05-04 NOTE — Progress Notes (Signed)
CRITICAL CARE NOTE       SUBJECTIVE FINDINGS & SIGNIFICANT EVENTS   63 year old comorbid patient status post cardiac arrest/ACLS with ROSC and TTM post CRRT.   Patient remains critically ill, weaning trial #2 failed today.   For HD again today with goal to remove several liters.    PAST MEDICAL HISTORY   Past Medical History:  Diagnosis Date  . Anemia   . Anxiety   . Arthritis   . Cancer [facility])    Left Kidney Cancer  . CHF (congestive heart failure) ([place])   . Chronic kidney disease   . COPD (chronic obstructive pulmonary disease) ([facility])    Use Oxygen at bedtime  . Coronary artery disease   . Diabetes mellitus without complication ([place])   . Dialysis patient ([place])    Tues, Thurs, Sat  . Dyspnea    with exertion  . GERD (gastroesophageal reflux disease)   . Gout   . History of kidney stones   . Hypertension   . Neuropathy   . Peripheral vascular disease ([place])   . Sleep apnea    OSA--USE C-PAP  . Stroke [facility]) 2004     SURGICAL HISTORY   Past Surgical History:  Procedure Laterality Date  . A/V FISTULAGRAM Left 04/26/2017   Procedure: A/V Fistulagram;  Surgeon: Katha Cabal, MD;  Location: [place] [facility];  Service: Cardiovascular;  Laterality: Left;  . ABDOMINAL HYSTERECTOMY    . CARDIAC CATHETERIZATION    . DIALYSIS/PERMA CATHETER INSERTION N/A 01/31/2017   Procedure: Dialysis/Perma Catheter Insertion;  Surgeon: Algernon Huxley, MD;  Location: [place] [facility];  Service: Cardiovascular;  Laterality: N/A;  . DIALYSIS/PERMA CATHETER INSERTION N/A 02/21/2017   Procedure: Dialysis/Perma Catheter Insertion;  Surgeon: Algernon Huxley, MD;  Location: [place] [facility];  Service: Cardiovascular;  Laterality: N/A;  . DIALYSIS/PERMA CATHETER INSERTION N/A 03/19/2017   Procedure: Dialysis/Perma  Catheter Insertion;  Surgeon: Katha Cabal, MD;  Location: [place] [facility];  Service: Cardiovascular;  Laterality: N/A;  . DIALYSIS/PERMA CATHETER REMOVAL N/A 07/19/2017   Procedure: DIALYSIS/PERMA CATHETER REMOVAL;  Surgeon: Katha Cabal, MD;  Location: [place] [facility];  Service: Cardiovascular;  Laterality: N/A;  . EYE SURGERY Bilateral    Cataract Extraction with IOL  . KIDNEY SURGERY Left    Partial Nephrectomy  . LEFT HEART CATH AND CORONARY ANGIOGRAPHY N/A 04/05/2017   Procedure: Left Heart Cath and Coronary Angiography;  Surgeon: Wellington Hampshire, MD;  Location: [place] [facility];  Service: Cardiovascular;  Laterality: N/A;  . PERIPHERAL VASCULAR CATHETERIZATION N/A 01/23/2016   Procedure: Dialysis/Perma Catheter Insertion;  Surgeon: Algernon Huxley, MD;  Location: [place] [facility];  Service: Cardiovascular;  Laterality: N/A;  . PERIPHERAL VASCULAR CATHETERIZATION Left 10/01/2016   Procedure: A/V Shuntogram/Fistulagram;  Surgeon: Algernon Huxley, MD;  Location: [place] [facility];  Service: Cardiovascular;  Laterality: Left;  . PERIPHERAL VASCULAR THROMBECTOMY Left 11/24/2018   Procedure: PERIPHERAL VASCULAR THROMBECTOMY;  Surgeon: Algernon Huxley, MD;  Location: [place] [facility];  Service: Cardiovascular;  Laterality: Left;  . UPPER EXTREMITY VENOGRAPHY Bilateral 04/26/2017   Procedure: Upper Extremity Venography;  Surgeon: Katha Cabal, MD;  Location: [place] [facility];  Service: Cardiovascular;  Laterality: Bilateral;  . VASCULAR ACCESS DEVICE INSERTION Left 05/22/2017   Procedure: INSERTION OF HERO VASCULAR ACCESS DEVICE;  Surgeon: Katha Cabal, MD;  Location: [facility];  Service: Vascular;  Laterality: Left;     FAMILY HISTORY  Family History  Problem Relation Age of Onset  . Hypertension Mother   . Heart failure Mother   . Diabetes Father   . Heart attack Father   . Lung cancer Maternal Aunt   . Cancer Maternal Aunt      SOCIAL HISTORY    Social History   Tobacco Use  . Smoking status: Former Research scientist (life sciences)  . Smokeless tobacco: Never Used  Substance Use Topics  . Alcohol use: No  . Drug use: No     MEDICATIONS   Current Medication:  Current Facility-Administered Medications:  .  albuterol (PROVENTIL) (2.5 MG/3ML) 0.083% nebulizer solution 2.5 mg, 2.5 mg, Nebulization, Q6H PRN, Manuella Ghazi, Vipul, MD .  allopurinol (ZYLOPRIM) tablet 150 mg, 150 mg, Per Tube, Once per day on Tue Thu Sat, Shah, Vipul, MD, 150 mg at 05/02/19 1303 .  amitriptyline (ELAVIL) tablet 25 mg, 25 mg, Per Tube, QHS, Max Sane, MD, 25 mg at 05/03/19 2247 .  atorvastatin (LIPITOR) tablet 40 mg, 40 mg, Per Tube, Daily, Max Sane, MD, 40 mg at 05/03/19 2247 .  azithromycin (ZITHROMAX) 500 mg in sodium chloride 0.9 % 250 mL IVPB, 500 mg, Intravenous, Q24H, Aleskerov, Fuad, MD, Last Rate: 250 mL/hr at 05/03/19 1239, 500 mg at 05/03/19 1239 .  calcium carbonate (TUMS - dosed in mg elemental calcium) chewable tablet 200 mg of elemental calcium, 1 tablet, Oral, PRN, Ouma, Bing Neighbors, NP .  chlorhexidine gluconate (MEDLINE KIT) (PERIDEX) 0.12 % solution 15 mL, 15 mL, Mouth Rinse, BID, Lanney Gins, Fuad, MD, 15 mL at 05/03/19 2058 .  Chlorhexidine Gluconate Cloth 2 % PADS 6 each, 6 each, Topical, Daily, Max Sane, MD, 6 each at 05/03/19 1645 .  citalopram (CELEXA) tablet 40 mg, 40 mg, Per Tube, Daily, Manuella Ghazi, Vipul, MD, 40 mg at 05/03/19 0930 .  clopidogrel (PLAVIX) tablet 75 mg, 75 mg, Per Tube, Daily, Max Sane, MD, 75 mg at 05/03/19 0930 .  dexmedetomidine (PRECEDEX) 400 MCG/100ML (4 mcg/mL) infusion, 0.4-1.2 mcg/kg/hr, Intravenous, Titrated, Aleskerov, Fuad, MD, Last Rate: 25 mL/hr at 05/04/19 0417, 0.8 mcg/kg/hr at 05/04/19 0417 .  docusate (COLACE) 50 MG/5ML liquid 200 mg, 200 mg, Per Tube, BID, Vira Blanco, RPH, 200 mg at 05/03/19 2248 .  doxycycline (VIBRAMYCIN) 100 mg in sodium chloride 0.9 % 250 mL IVPB, 100 mg, Intravenous, Q12H, Aleskerov, Fuad,  MD, Last Rate: 125 mL/hr at 05/03/19 2246, 100 mg at 05/03/19 2246 .  epoetin alfa (EPOGEN) injection 10,000 Units, 10,000 Units, Intravenous, Q T,Th,Sa-HD, Kolluru, Sarath, MD, 10,000 Units at 05/02/19 1049 .  famotidine (PEPCID) tablet 20 mg, 20 mg, Per Tube, Daily, Awilda Bill, NP, 20 mg at 05/03/19 0930 .  feeding supplement (PRO-STAT SUGAR FREE 64) liquid 60 mL, 60 mL, Per Tube, QID, Aleskerov, Fuad, MD, 60 mL at 05/03/19 2248 .  feeding supplement (VITAL HIGH PROTEIN) liquid 1,000 mL, 1,000 mL, Per Tube, Q24H, Aleskerov, Fuad, MD, 1,000 mL at 05/03/19 1715 .  fentaNYL (SUBLIMAZE) injection 50 mcg, 50 mcg, Intravenous, Q15 min PRN, Awilda Bill, NP .  fentaNYL (SUBLIMAZE) injection 50-200 mcg, 50-200 mcg, Intravenous, Q30 min PRN, Awilda Bill, NP, 200 mcg at 05/02/19 1426 .  fluticasone (FLONASE) 50 MCG/ACT nasal spray 2 spray, 2 spray, Each Nare, Daily, Ouma, Bing Neighbors, NP, 2 spray at 05/03/19 0932 .  heparin injection 5,000 Units, 5,000 Units, Subcutaneous, Q8H, Awilda Bill, NP, 5,000 Units at 05/04/19 0553 .  insulin aspart (novoLOG) injection 0-9 Units, 0-9 Units, Subcutaneous, Q4H, Ottie Glazier, MD,  3 Units at 05/04/19 0453 .  lidocaine-prilocaine (EMLA) cream 1 application, 1 application, Topical, PRN, Ouma, Bing Neighbors, NP .  loratadine (CLARITIN) tablet 10 mg, 10 mg, Per Tube, QHS, Vira Blanco, RPH, 10 mg at 05/03/19 2247 .  MEDLINE mouth rinse, 15 mL, Mouth Rinse, 10 times per day, Ottie Glazier, MD, 15 mL at 05/04/19 0554 .  midazolam (VERSED) injection 2 mg, 2 mg, Intravenous, Q2H PRN, Darel Hong D, NP, 2 mg at 05/04/19 4098 .  midodrine (PROAMATINE) tablet 10 mg, 10 mg, Oral, TID WC, Aleskerov, Fuad, MD, 10 mg at 05/03/19 1721 .  multivitamin liquid 15 mL, 15 mL, Per Tube, Daily, Ottie Glazier, MD, 15 mL at 05/03/19 0933 .  norepinephrine (LEVOPHED) 16 mg in dextrose 5 % 250 mL (0.064 mg/mL) infusion, 0-40 mcg/min, Intravenous,  Titrated, Ottie Glazier, MD, Stopped at 05/03/19 0501 .  nystatin (MYCOSTATIN/NYSTOP) topical powder 1 g, 1 g, Topical, Daily PRN, Ouma, Bing Neighbors, NP .  polyethylene glycol powder (GLYCOLAX/MIRALAX) container 17 g, 17 g, Oral, TID PRN, Ouma, Bing Neighbors, NP .  polyvinyl alcohol (LIQUIFILM TEARS) 1.4 % ophthalmic solution 1-2 drop, 1-2 drop, Both Eyes, QID PRN, Ouma, Bing Neighbors, NP .  sodium chloride flush (NS) 0.9 % injection 10-40 mL, 10-40 mL, Intracatheter, Q12H, Ottie Glazier, MD, 10 mL at 05/03/19 2225 .  sodium chloride flush (NS) 0.9 % injection 10-40 mL, 10-40 mL, Intracatheter, PRN, Ottie Glazier, MD    ALLERGIES   Iodine; Enalapril; and Iodinated diagnostic agents    REVIEW OF SYSTEMS      PHYSICAL EXAMINATION   Vitals:   05/04/19 0500 05/04/19 0600  BP: (!) 117/54 (!) 93/52  Pulse: 63 62  Resp: (!) 9 (!) 21  Temp:    SpO2: (!) 86% 91%    GENERAL: Sedated on mechanical ventilation HEAD: Normocephalic, atraumatic.  EYES: Pupils equal, round, reactive to light.  No scleral icterus.  MOUTH: Moist mucosal membrane. NECK: Supple. No thyromegaly. No nodules. No JVD.  PULMONARY: Decreased breath sounds bilaterally CARDIOVASCULAR: S1 and S2. Regular rate and rhythm. No murmurs, rubs, or gallops.  GASTROINTESTINAL: Soft, nontender, non-distended. No masses. Positive bowel sounds. No hepatosplenomegaly.  MUSCULOSKELETAL: No swelling, clubbing, or edema.  NEUROLOGIC: Mild distress due to acute illness SKIN:intact,warm,dry   LABS AND IMAGING    LAB RESULTS: Recent Labs  Lab 05/02/19 0910 05/03/19 0439 05/04/19 0436  NA 136 137 136  K 3.9 3.6 3.7  CL 102 101 102  CO2 '26 27 25  ' BUN 55* 46* 75*  CREATININE 3.15* 3.40* 4.90*  GLUCOSE 193* 206* 222*   Recent Labs  Lab 05/02/19 0404 05/02/19 0910 05/03/19 0439  HGB 8.7* 8.7* 8.3*  HCT 27.9* 27.1* 26.8*  WBC 9.1 9.9 9.8  PLT 142* 158 170     IMAGING RESULTS: No results  found.    ASSESSMENT AND PLAN     Acutecomatose state -mental status improved now - patient able to follow verbal communication inconsistently -continue Full MV support -continue Bronchodilator Therapy -Wean Fio2 and PEEP as tolerated -will perform SAT/SBT when respiratory parameters are met -Discussed with Husband Tommy Libman(multiple times since admission) - he is appreciative of care    Severe hyperkalemia- resolved  S/p dialysis via CRRT - HCO3-, insulin, Veltasa, Calcium gluconate ICU monitoring Nephrology on case-appreciate input pharmacy consultation - now with hypokalemia and hypophosphatemia   NSTEMI -Status post cardiac arrest and ACLS -Trending troponin 0 0.53- trending down0.91-0.66 -Empirically initiating heparin drip-patient continues to have clotting issues  with dialysis catheter despite therapeutic heparin    Renal Failure-Renal failure ESRD - starting CRRT - L trialysis cath nephro on case - appreciate input -follow chem 7 -follow UO -continue Foley Catheter-assess need daily -did not take off fluid   Possible pneumonia -Repeat CXR today yesterday chest x-ray reviewed by me with significant rotation to the right, evidence of pleural effusion with atelectasis difficult to rule out infiltrate. -We will reduce nephrotoxic medications currently on Zithromax and doxycycline IV -Have stopped cefepime due to CNS effects of encephalopathy    NEUROLOGY - intubated and sedated - minimal sedation to achieve a RASS goal: -1 Wake up assessment pending    ID -continue IV abx as prescibed -follow up cultures  GI/Nutrition GI PROPHYLAXIS as indicated DIET-->TF's as tolerated Constipation protocol as indicated  ENDO - ICU hypoglycemic\Hyperglycemia protocol -check FSBS per protocol   ELECTROLYTES -follow labs as needed -replace as needed -pharmacy consultation   DVT/GI PRX ordered -SCDs   TRANSFUSIONS AS NEEDED MONITOR FSBS ASSESS the need for LABS as needed   Critical care provider statement:  Critical care time (minutes):32 Critical care time was exclusive of: Separately billable procedures and treating other patients Critical care was necessary to treat or prevent imminent or life-threatening deterioration of the following conditions:Circulatory shock, cardiac arrest, acute comatose state, severe hyperkalemia, AV graft fistula malfunction, fluid overload, diabetes, OSA, multiple comorbid conditions Critical care was time spent personally by me on the following activities: Development of treatment plan with patient or surrogate, discussions with consultants, evaluation of patient's response to treatment, examination of patient, obtaining history from patient or surrogate, ordering and performing treatments and interventions, ordering and review of laboratory studies and re-evaluation of patient's condition. I assumed direction of critical care for this patient from another provider in my specialty: no   This document was prepared using Dragon voice recognition software and may include unintentional dictation errors.   Ottie Glazier, M.D.  Division of [place]

## 2019-05-04 NOTE — TOC Initial Note (Signed)
Transition of Care [facility]) - Initial/Assessment Note    Patient Details  Name: [name] MRN: [id number] Date of Birth: 1956/03/01  Transition of Care [facility]) CM/SW Contact:    Shelbie Hutching, RN Phone Number: 05/04/2019, 11:48 AM  Clinical Narrative:                 Patient admitted after coding in the ED on 5/26.  Patient was scheduled for dialysis shunt declot but became short of breath before having the procedure, patient was evaluated in the ED, potassium was >7.  Patient coded and required intubation and chest compressions.  Patient regained pulse and was transferred to ICU.  Patient is currently intubated and sedated with precidex.  Patient failed vent weaning trial yesterday, plan to try again today and hopefully extubate.  RNCM will cont to monitor patient progress.  Elvera Bicker with patient pathways is aware of patient admission.   Expected Discharge Plan: (unsure of discharge needs at this time) Barriers to Discharge: Continued Medical Work up   Patient Goals and CMS Choice Patient states their goals for this hospitalization and ongoing recovery are:: Pt is intubated, unable to voice goals      Expected Discharge Plan and Services Expected Discharge Plan: (unsure of discharge needs at this time)   Discharge Planning Services: CM Consult   Living arrangements for the past 2 months: Single Family Home                                      Prior Living Arrangements/Services Living arrangements for the past 2 months: Single Family Home Lives with:: Spouse Patient language and need for interpreter reviewed:: No        Need for Family Participation in Patient Care: Yes (Comment)(chronic HD pt, intubated for 6 days now) Care giver support system in place?: Yes (comment)(Husband)   Criminal Activity/Legal Involvement Pertinent to Current Situation/Hospitalization: No - Comment as needed  Activities of Daily Living      Permission Sought/Granted                   Emotional Assessment Appearance:: Appears stated age Attitude/Demeanor/Rapport: Intubated (Following Commands or Not Following Commands) Affect (typically observed): Calm, Unable to Assess   Alcohol / Substance Use: Not Applicable Psych Involvement: No (comment)  Admission diagnosis:  Hyperkalemia [E87.5] ESRD on hemodialysis ([place]) [N18.6, Z99.2] HCAP (healthcare-associated pneumonia) [J18.9] Clotted dialysis access, initial encounter ([place]) [T82.49XA] Acute on chronic kidney failure ([place]) [N17.9, N18.9] Patient Active Problem List   Diagnosis Date Noted  . Acute on chronic kidney failure ([place]) 05/01/2019  . Pain in joint, upper arm 07/16/2018  . Left arm pain 07/16/2018  . 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 Complication of renal dialysis device 05/06/2017  . Abnormal stress test   . Cerebrovascular accident, old 06/21/2014  . CAFL (chronic airflow limitation) ([place]) 04/09/2014  . Gout 02/16/2014  . BP (high blood pressure) 02/16/2014  . Diabetes ([place]) 09/28/2013  . Dermatophytoses 09/28/2013  . ESRD on dialysis ([place]) 09/28/2013  . Biliary calculi 05/03/2011  . Idiopathic localized osteoarthropathy 02/12/2011   PCP:  Maryann Conners, MD Pharmacy:   [facility], [place], [place] [address] [place] [address] Phone: [phone] Fax: [place], [place] - [place]  [place] [address] Phone: [phone] Fax: [phone]     Social Determinants of Health (SDOH) Interventions    Readmission Risk Interventions No flowsheet data found.

## 2019-05-04 NOTE — Progress Notes (Signed)
Inpatient Diabetes Program Recommendations  AACE/ADA: New Consensus Statement on Inpatient Glycemic Control   Target Ranges:  Prepandial:   less than 140 mg/dL      Peak postprandial:   less than 180 mg/dL (1-2 hours)      Critically ill patients:  140 - 180 mg/dL   Results for [name], [name] (MRN [id number]) as of 05/04/2019 11:50  Ref. Range 05/03/2019 07:25 05/03/2019 11:34 05/03/2019 15:47 05/03/2019 19:33 05/03/2019 23:46 05/04/2019 04:46 05/04/2019 07:54 05/04/2019 11:09  Glucose-Capillary Latest Ref Range: 70 - 99 mg/dL 189 (H) 179 (H) 195 (H) 234 (H) 225 (H) 203 (H) 201 (H) 205 (H)   Review of Glycemic Control  Current orders for Inpatient glycemic control: Novolog 0-9 units Q4H  Inpatient Diabetes Program Recommendations:   Insulin - Basal: Please consider ordering Lantus 5 units Q24H.  Insulin -Tube Feeding Coverage: Please consider ordering Novolog 3 units Q4H for Tube Feeding coverage. If tube feeding is stopped or held then Novolog tube feeding coverage should also be stopped or held.  Thanks, Barnie Alderman, RN, MSN, CDE Diabetes Coordinator Inpatient Diabetes Program [phone] (Team Pager from 8am to 5pm)

## 2019-05-04 NOTE — Progress Notes (Signed)
[facility] for Electrolyte Monitoring and Replacement   Recent Labs: Potassium (mmol/L)  Date Value  05/04/2019 3.7  03/31/2015 4.2   Magnesium (mg/dL)  Date Value  05/04/2019 2.0  06/14/2014 1.8   Calcium (mg/dL)  Date Value  05/04/2019 7.9 (L)   Calcium, Total (mg/dL)  Date Value  03/31/2015 7.0 (LL)   Albumin (g/dL)  Date Value  05/03/2019 2.3 (L)  03/31/2015 2.3 (L)   Phosphorus (mg/dL)  Date Value  05/03/2019 2.4 (L)  03/31/2015 3.6   Sodium (mmol/L)  Date Value  05/04/2019 136  03/31/2015 129 (L)   Corrected Ca: 9.26 mg/dL  Assessment: Patient is a 63yo female admitted after cardiac arrest.    Pharmacy consulted to manage electrolytes.  Patient is ESRD, CRRT was stopped 5/30 @ 0030 . Now getting HD.  Plan:  --All monitored electrolytes today are wnl: no replacement required --Pharmacy will continue to monitor and replace electrolytes as needed.   Dallie Piles, PharmD Clinical Pharmacist 05/04/2019 1:55 PM

## 2019-05-04 DEATH — deceased

## 2019-05-05 DIAGNOSIS — J9601 Acute respiratory failure with hypoxia: Secondary | ICD-10-CM

## 2019-05-05 DIAGNOSIS — I469 Cardiac arrest, cause unspecified: Secondary | ICD-10-CM

## 2019-05-05 LAB — GLUCOSE, CAPILLARY
Glucose-Capillary: 105 mg/dL — ABNORMAL HIGH (ref 70–99)
Glucose-Capillary: 106 mg/dL — ABNORMAL HIGH (ref 70–99)
Glucose-Capillary: 131 mg/dL — ABNORMAL HIGH (ref 70–99)
Glucose-Capillary: 147 mg/dL — ABNORMAL HIGH (ref 70–99)
Glucose-Capillary: 155 mg/dL — ABNORMAL HIGH (ref 70–99)
Glucose-Capillary: 93 mg/dL (ref 70–99)
Glucose-Capillary: 94 mg/dL (ref 70–99)

## 2019-05-05 LAB — BASIC METABOLIC PANEL
Anion gap: 10 (ref 5–15)
BUN: 98 mg/dL — ABNORMAL HIGH (ref 8–23)
CO2: 25 mmol/L (ref 22–32)
Calcium: 7.8 mg/dL — ABNORMAL LOW (ref 8.9–10.3)
Chloride: 101 mmol/L (ref 98–111)
Creatinine, Ser: 6.17 mg/dL — ABNORMAL HIGH (ref 0.44–1.00)
GFR calc Af Amer: 8 mL/min — ABNORMAL LOW (ref >60)
GFR calc non Af Amer: 7 mL/min — ABNORMAL LOW (ref >60)
Glucose, Bld: 177 mg/dL — ABNORMAL HIGH (ref 70–99)
Potassium: 3.8 mmol/L (ref 3.5–5.1)
Sodium: 136 mmol/L (ref 135–145)

## 2019-05-05 LAB — CBC WITH DIFFERENTIAL/PLATELET
Abs Immature Granulocytes: 0.73 10*3/uL — ABNORMAL HIGH (ref 0.00–0.07)
Basophils Absolute: 0 10*3/uL (ref 0.0–0.1)
Basophils Relative: 0 %
Eosinophils Absolute: 0.2 10*3/uL (ref 0.0–0.5)
Eosinophils Relative: 1 %
HCT: 26.7 % — ABNORMAL LOW (ref 36.0–46.0)
Hemoglobin: 8.5 g/dL — ABNORMAL LOW (ref 12.0–15.0)
Immature Granulocytes: 6 %
Lymphocytes Relative: 15 %
Lymphs Abs: 1.7 10*3/uL (ref 0.7–4.0)
MCH: 29.4 pg (ref 26.0–34.0)
MCHC: 31.8 g/dL (ref 30.0–36.0)
MCV: 92.4 fL (ref 80.0–100.0)
Monocytes Absolute: 0.9 10*3/uL (ref 0.1–1.0)
Monocytes Relative: 8 %
Neutro Abs: 7.9 10*3/uL — ABNORMAL HIGH (ref 1.7–7.7)
Neutrophils Relative %: 70 %
Platelets: 243 10*3/uL (ref 150–400)
RBC: 2.89 MIL/uL — ABNORMAL LOW (ref 3.87–5.11)
RDW: 17.3 % — ABNORMAL HIGH (ref 11.5–15.5)
Smear Review: NORMAL
WBC: 11.4 10*3/uL — ABNORMAL HIGH (ref 4.0–10.5)
nRBC: 0.7 % — ABNORMAL HIGH (ref 0.0–0.2)

## 2019-05-05 MED ORDER — PRO-STAT SUGAR FREE PO LIQD: 60.0000 mL | Freq: Three times a day (TID) | ORAL | Status: DC

## 2019-05-05 MED ORDER — PHENOL 1.4 % MT LIQD
1.0000 | OROMUCOSAL | Status: DC | PRN
  Administered 2019-05-05: 1 via OROMUCOSAL
  Filled 2019-05-05: qty 177

## 2019-05-05 MED ORDER — ORAL CARE MOUTH RINSE
15.0000 mL | Freq: Two times a day (BID) | OROMUCOSAL | Status: DC
  Administered 2019-05-06 – 2019-05-08 (×5): 15 mL via OROMUCOSAL

## 2019-05-05 MED ORDER — B COMPLEX-C PO TABS
1.0000 | ORAL_TABLET | Freq: Every day | ORAL | Status: DC
  Administered 2019-05-05: 1
  Filled 2019-05-05: qty 1

## 2019-05-05 MED ORDER — PIVOT 1.5 CAL PO LIQD
1000.0000 mL | ORAL | Status: DC
  Administered 2019-05-05: 1000 mL
  Filled 2019-05-05: qty 1000

## 2019-05-05 NOTE — Progress Notes (Signed)
Post HD Assessment  776mL removed. Unable to reach goal fluid removal of 2 liters d/t hypotensive episodes. Albumin and epogen given during tx. BP stabilized post rinseback to 100/48.    05/05/19 1229  Neurological  Level of Consciousness Responds to Voice  Orientation Level Intubated/Tracheostomy - Unable to assess  Respiratory  Respiratory Pattern Regular  Chest Assessment Chest expansion symmetrical  Bilateral Breath Sounds Diminished  Cough Strong  Cardiac  Pulse Regular  Heart Sounds S1, S2  Jugular Venous Distention (JVD) No  Cardiac Rhythm NSR  Vascular  R Radial Pulse +1  L Radial Pulse +1  Edema Generalized  Generalized Edema +2  RUE Edema +2  Integumentary  Integumentary (WDL) X  Skin Color Appropriate for ethnicity  Skin Condition Dry  Skin Integrity MASD  Musculoskeletal  Musculoskeletal (WDL) X  Generalized Weakness Yes  Gastrointestinal  Bowel Sounds Assessment Active  GU Assessment  Genitourinary (WDL) X  Genitourinary Symptoms Anuria  Psychosocial  Psychosocial (WDL) X  Patient Behaviors Withdrawn  Needs Expressed Emotional;Physical  Emotional support given Given to patient

## 2019-05-05 NOTE — Progress Notes (Signed)
Per Dr Mortimer Fries stopped tube feeds. Patient getting dialysis. Orders for Respitory to titrate vent setting for possible extubation this afternoon. Patient received versed and fentanyl prior to dialysis for agitation. Continue to monitor.

## 2019-05-05 NOTE — Progress Notes (Signed)
[facility]  ROUNDING NOTE   Subjective:   Remains critically ill, intubated, sedated Vent: Fio2 40% Able to follow simple commands Patient seen during dialysis Tolerating well    HEMODIALYSIS FLOWSHEET:  Blood Flow Rate (mL/min): 400 mL/min Arterial Pressure (mmHg): -180 mmHg Venous Pressure (mmHg): 190 mmHg Transmembrane Pressure (mmHg): 50 mmHg Ultrafiltration Rate (mL/min): 830 mL/min Dialysate Flow Rate (mL/min): 600 ml/min Conductivity: Machine : 14 Conductivity: Machine : 14 Dialysis Fluid Bolus: Normal Saline Bolus Amount (mL): 250 mL    Objective:  Vital signs in last 24 hours:  Temp:  [97.2 F (36.2 C)-98 F (36.7 C)] 97.2 F (36.2 C) (06/02 0900) Pulse Rate:  [57-80] 72 (06/02 1050) Resp:  [8-28] 25 (06/02 1050) BP: (87-139)/(47-68) 87/54 (06/02 1050) SpO2:  [86 %-99 %] 97 % (06/02 1050) FiO2 (%):  [45 %-60 %] 60 % (06/02 0943) Weight:  [145.8 kg] 145.8 kg (06/02 0900)  Weight change:  Filed Weights   05/03/19 0242 05/05/19 0457 05/05/19 0900  Weight: (!) 139.6 kg (!) 145.8 kg (!) 145.8 kg    Intake/Output: I/O last 3 completed shifts: In: 2502.1 [I.V.:1027.4; NG/GT:474.8; IV Piggyback:1000] Out: -    Intake/Output this shift:  Total I/O In: 100 [IV Piggyback:100] Out: -   Physical Exam: General: Critically ill   Head: ETT, OGT  Eyes: Eyes closed  Neck: obese  Lungs:  coarse breath sounds  Heart: Regular rate and rhythm  Abdomen:  Soft, nontender, obese  Extremities:  + peripheral edema.  Neurologic: Intubated and sedated, follows simple commands  Skin: No lesions  Access: +thrombosed left arm AVG, temp femoral LEFT  HD catheter Dr. Lanney Gins    Basic Metabolic Panel: Recent Labs  Lab 05/01/19 1800 05/01/19 2209 05/02/19 0404 05/02/19 0910 05/03/19 0439 05/04/19 0436 05/05/19 0458  NA 135 135 135 136 137 136 136  K 3.4* 3.7 3.8 3.9 3.6 3.7 3.8  CL 103 102 102 102 101 102 101  CO2 '26 27 26 26 27 25 25  ' GLUCOSE  202* 204* 194* 193* 206* 222* 177*  BUN 36* 39* 49* 55* 46* 75* 98*  CREATININE 2.20* 2.19* 2.61* 3.15* 3.40* 4.90* 6.17*  CALCIUM 7.6* 8.0* 7.6* 7.8* 7.9* 7.9* 7.8*  MG 1.7 1.9 1.9  --  1.9 2.0  --   PHOS 2.4* 2.6 2.8 2.5 2.4*  --   --     Liver Function Tests: Recent Labs  Lab 04/25/2019 1451  05/01/19 1800 05/01/19 2209 05/02/19 0404 05/02/19 0910 05/03/19 0439  AST 32  --   --   --   --   --  11*  ALT 41  --   --   --   --   --  14  ALKPHOS 218*  --   --   --   --   --  103  BILITOT 0.9  --   --   --   --   --  0.5  PROT 7.1  --   --   --   --   --  5.8*  ALBUMIN 3.3*   < > 2.1* 2.2* 2.3* 2.2* 2.3*   < > = values in this interval not displayed.   No results for input(s): LIPASE, AMYLASE in the last 168 hours. No results for input(s): AMMONIA in the last 168 hours.  CBC: Recent Labs  Lab 04/12/2019 1451  04/30/19 0500 05/01/19 0405 05/02/19 0404 05/02/19 0910 05/03/19 0439 05/05/19 0458  WBC 17.7*   < > 17.9*  12.9* 9.1 9.9 9.8 11.4*  NEUTROABS 14.7*  --  15.1* 9.5* 6.5  --   --  7.9*  HGB 13.9   < > 11.1* 9.4* 8.7* 8.7* 8.3* 8.5*  HCT 42.6   < > 34.9* 29.9* 27.9* 27.1* 26.8* 26.7*  MCV 90.3   < > 91.6 92.9 94.3 92.5 95.0 92.4  PLT 272   < > 142* 132* 142* 158 170 243   < > = values in this interval not displayed.    Cardiac Enzymes: Recent Labs  Lab 04/03/2019 1451 04/07/2019 2139 04/29/19 0423  TROPONINI 0.53* 0.91* 0.66*    BNP: Invalid input(s): POCBNP  CBG: Recent Labs  Lab 05/04/19 1558 05/04/19 1926 05/05/19 0000 05/05/19 0349 05/05/19 0730  GLUCAP 163* 139* 155* 147* 131*    Microbiology: Results for orders placed or performed during the hospital encounter of 04/17/2019  SARS Coronavirus 2 (CEPHEID - Performed in [place] hospital lab), Hosp Order     Status: None   Collection Time: 04/15/2019  7:31 AM  Result Value Ref Range Status   SARS Coronavirus 2 NEGATIVE NEGATIVE Final    Comment: (NOTE) If result is NEGATIVE SARS-CoV-2 target  nucleic acids are NOT DETECTED. The SARS-CoV-2 RNA is generally detectable in upper and lower  respiratory specimens during the acute phase of infection. The lowest  concentration of SARS-CoV-2 viral copies this assay can detect is 250  copies / mL. A negative result does not preclude SARS-CoV-2 infection  and should not be used as the sole basis for treatment or other  patient management decisions.  A negative result may occur with  improper specimen collection / handling, submission of specimen other  than nasopharyngeal swab, presence of viral mutation(s) within the  areas targeted by this assay, and inadequate number of viral copies  (<250 copies / mL). A negative result must be combined with clinical  observations, patient history, and epidemiological information. If result is POSITIVE SARS-CoV-2 target nucleic acids are DETECTED. The SARS-CoV-2 RNA is generally detectable in upper and lower  respiratory specimens dur ing the acute phase of infection.  Positive  results are indicative of active infection with SARS-CoV-2.  Clinical  correlation with patient history and other diagnostic information is  necessary to determine patient infection status.  Positive results do  not rule out bacterial infection or co-infection with other viruses. If result is PRESUMPTIVE POSTIVE SARS-CoV-2 nucleic acids MAY BE PRESENT.   A presumptive positive result was obtained on the submitted specimen  and confirmed on repeat testing.  While 2019 novel coronavirus  (SARS-CoV-2) nucleic acids may be present in the submitted sample  additional confirmatory testing may be necessary for epidemiological  and / or clinical management purposes  to differentiate between  SARS-CoV-2 and other Sarbecovirus currently known to infect humans.  If clinically indicated additional testing with an alternate test  methodology [id number]) is advised. The SARS-CoV-2 RNA is generally  detectable in upper and lower  respiratory sp ecimens during the acute  phase of infection. The expected result is Negative. Fact Sheet for Patients:  StrictlyIdeas.no Fact Sheet for Healthcare Providers: BankingDealers.co.za This test is not yet approved or cleared by the [place] FDA and has been authorized for detection and/or diagnosis of SARS-CoV-2 by FDA under an Emergency Use Authorization (EUA).  This EUA will remain in effect (meaning this test can be used) for the duration of the COVID-19 declaration under Section 564(b)(1) of the Act, 21 U.S.C. section 360bbb-3(b)(1), unless the authorization is  terminated or revoked sooner. Performed at [facility], [address] [place], [place] [address]   MRSA PCR Screening     Status: None   Collection Time: 04/18/2019  9:04 AM  Result Value Ref Range Status   MRSA by PCR NEGATIVE NEGATIVE Final    Comment:        The GeneXpert MRSA Assay (FDA approved for NASAL specimens only), is one component of a comprehensive MRSA colonization surveillance program. It is not intended to diagnose MRSA infection nor to guide or monitor treatment for MRSA infections. Performed at [facility], [address] [place], [place] [address]   Culture, respiratory (non-expectorated)     Status: None   Collection Time: 05/01/19 11:05 AM  Result Value Ref Range Status   Specimen Description   Final    TRACHEAL ASPIRATE Performed at [facility], [address] [place], [place] [address]    Special Requests   Final    NONE Performed at [facility], [address] [place], [place] [address]    Gram Stain   Final    MODERATE WBC PRESENT, PREDOMINANTLY PMN RARE YEAST Performed at [facility], [address], [place], [place] [address]    Culture FEW CANDIDA ALBICANS FEW CANDIDA TROPICALIS   Final   Report Status 05/04/2019 FINAL  Final    Coagulation Studies: No  results for input(s): LABPROT, INR in the last 72 hours.  Urinalysis: No results for input(s): COLORURINE, LABSPEC, PHURINE, GLUCOSEU, HGBUR, BILIRUBINUR, KETONESUR, PROTEINUR, UROBILINOGEN, NITRITE, LEUKOCYTESUR in the last 72 hours.  Invalid input(s): APPERANCEUR    Imaging: Dg Chest Port 1 View  Result Date: 05/04/2019 CLINICAL DATA:  Acute respiratory failure EXAM: PORTABLE CHEST 1 VIEW COMPARISON:  05/01/2019 FINDINGS: Endotracheal tube, gastric catheter and hero graft are again noted and stable. Cardiac shadow is stable. Right upper lobe atelectasis is noted along the minor fissure new from the prior exam. Some improved aeration in right base is noted when compare with the prior exam. Mild vascular congestion is noted. Small left effusion is noted. IMPRESSION: Improved aeration in the right base although some right upper lobe atelectasis is noted. Mild vascular congestion. Electronically Signed   By: Inez Catalina M.D.   On: 05/04/2019 07:17     Medications:   . dexmedetomidine (PRECEDEX) IV infusion 1 mcg/kg/hr (05/05/19 0907)  . feeding supplement (PIVOT 1.5 CAL)     . allopurinol  150 mg Per Tube Once per day on Tue Thu Sat  . amitriptyline  25 mg Per Tube QHS  . atorvastatin  40 mg Per Tube Daily  . B-complex with vitamin C  1 tablet Per Tube QHS  . chlorhexidine gluconate (MEDLINE KIT)  15 mL Mouth Rinse BID  . Chlorhexidine Gluconate Cloth  6 each Topical Daily  . citalopram  40 mg Per Tube Daily  . clopidogrel  75 mg Per Tube Daily  . docusate  200 mg Per Tube BID  . epoetin (EPOGEN/PROCRIT) injection  10,000 Units Intravenous Q T,Th,Sa-HD  . famotidine  20 mg Per Tube Daily  . feeding supplement (PRO-STAT SUGAR FREE 64)  60 mL Per Tube TID  . fluticasone  2 spray Each Nare Daily  . heparin injection (subcutaneous)  5,000 Units Subcutaneous Q8H  . insulin aspart  0-9 Units Subcutaneous Q4H  . insulin aspart  3 Units Subcutaneous Q4H  . insulin glargine  5 Units  Subcutaneous QHS  . loratadine  10 mg Per Tube  QHS  . mouth rinse  15 mL Mouth Rinse 10 times per day  . midodrine  10 mg Oral TID WC  . senna  1 tablet Oral QHS  . sodium chloride flush  10-40 mL Intracatheter Q12H   albuterol, calcium carbonate, fentaNYL (SUBLIMAZE) injection, fentaNYL (SUBLIMAZE) injection, lidocaine-prilocaine, midazolam, nystatin, polyethylene glycol powder, polyvinyl alcohol, sodium chloride flush  Assessment/ Plan:  [name] is a 63 y.o. black female with end stage renal disease on hemodialysis, hypertension, CVA, obstructive sleep apnea, peripheral vascular disease, hypertension, gout, GERD, diabetes mellitus type II, COPD, coronary artery disease, congestive heart failure, who  was admitted to [facility] on 04/12/2019 for pneumonia. Acute respiratory failure requiring mechanical ventilation and vasopressors.   TTS [facility] left AVG   1. End stage renal disease  With generalized edema Complication of dialysis device, clotted AVG. Temp HD catheter placed by ICU - Continue intermittent hemodialysis. TTS schedule - UF as tolerated - Appreciate vascular input. Thrombectomy once patient is stable.   2. Anemia of chronic kidney disease:  Lab Results  Component Value Date   HGB 8.5 (L) 05/05/2019   - EPO with HD treatment TTS  3. Sepsis/Hypotension:  - improved  4. Acute respiratory failure with cardiopulmonary arrest.  Intubated and sedated.  - able to follow simple commands this morning   LOS: 7 Harmeet Singh 6/2/202011:06 AM

## 2019-05-05 NOTE — Progress Notes (Signed)
Spoke with patient's spouse - [name] to update regarding patient's status

## 2019-05-05 NOTE — Progress Notes (Signed)
[place] [facility] [place] NAME: [name]    MR#:  [id number]  DATE OF BIRTH:  1956-04-14  SUBJECTIVE; patient critically ill, intubated, sedated.  Still on low-dose pressors, patient had temp HD catheter placed..  CHIEF COMPLAINT:   Chief Complaint  Patient presents with  . Shortness of Breath   Seen earlier this morning was on spontaneous breathing trial   REVIEW OF SYSTEMS:   Review of Systems  Unable to perform ROS: Critical illness    DRUG ALLERGIES:   Allergies  Allergen Reactions  . Iodine Rash  . Enalapril Other (See Comments)    TONGUE SWELLS/GOUT  . Iodinated Diagnostic Agents Other (See Comments)    BREAK OUT IN WHELPS      VITALS:  Blood pressure (!) 117/54, pulse 64, temperature (!) 97.4 F (36.3 C), temperature source Axillary, resp. rate (!) 26, height 5' 5.98" (1.676 m), weight (!) 145 kg, SpO2 97 %.  PHYSICAL EXAMINATION:  GENERAL:  63 y.o.-on the ventilator EYES: Pupils equal, round, reactive to light  No scleral icterus.  HEENT: Head atraumatic, normocephalic.  Currently intubated, sedated.  Clear.  NECK:  Supple, no jugular venous distention. No thyroid enlargement, no tenderness.  LUNGS: Diminished breath sounds bilaterally. CARDIOVASCULAR: S1, S2 normal. No murmurs, rubs, or gallops.  ABDOMEN: Soft, nontender, nondistended. Bowel sounds present. No organomegaly or mass.  EXTREMITIES: No pedal edema, cyanosis, or clubbing.  NEUROLOGIC: Unable to do full neuro exam because of intubation, sedation. PSYCHIATRIC: Intubated, sedated.    SKIN: No obvious rash, lesion, or ulcer.    LABORATORY PANEL:   CBC Recent Labs  Lab 05/05/19 0458  WBC 11.4*  HGB 8.5*  HCT 26.7*  PLT 243   ------------------------------------------------------------------------------------------------------------------  Chemistries  Recent Labs  Lab 05/03/19 0439 05/04/19 0436 05/05/19 0458  NA 137 136 136  K  3.6 3.7 3.8  CL 101 102 101  CO2 27 25 25   GLUCOSE 206* 222* 177*  BUN 46* 75* 98*  CREATININE 3.40* 4.90* 6.17*  CALCIUM 7.9* 7.9* 7.8*  MG 1.9 2.0  --   AST 11*  --   --   ALT 14  --   --   ALKPHOS 103  --   --   BILITOT 0.5  --   --    ------------------------------------------------------------------------------------------------------------------  Cardiac Enzymes Recent Labs  Lab 04/29/19 0423  TROPONINI 0.66*   ------------------------------------------------------------------------------------------------------------------  RADIOLOGY:  Dg Chest Port 1 View  Result Date: 05/04/2019 CLINICAL DATA:  Acute respiratory failure EXAM: PORTABLE CHEST 1 VIEW COMPARISON:  05/01/2019 FINDINGS: Endotracheal tube, gastric catheter and hero graft are again noted and stable. Cardiac shadow is stable. Right upper lobe atelectasis is noted along the minor fissure new from the prior exam. Some improved aeration in right base is noted when compare with the prior exam. Mild vascular congestion is noted. Small left effusion is noted. IMPRESSION: Improved aeration in the right base although some right upper lobe atelectasis is noted. Mild vascular congestion. Electronically Signed   By: Inez Catalina M.D.   On: 05/04/2019 07:17    EKG:   Orders placed or performed during the hospital encounter of 04/14/2019  . EKG 12-Lead  . EKG 12-Lead    ASSESSMENT AND PLAN:   63 year old female patient with history of CVA, obstructive sleep apnea on CPAP, ESRD on hemodialysis Tuesday, Thursday, Saturday, congestive heart failure, COPD comes in because of worsening shortness of breath, she was called in the emergency room,  had PEA, intubated, admitted to intensive care unit.  1 .acute respiratory failure status post PEA,  Plan for extubation later today Appreciate pulmonary input   2.  Septic shock secondary to HCAP, continue doxycycline, continue azithromycin and doxycycline   3.  ESRD, on  hemodialysis Hemodialysis as per nephrology   4.  Diabetes type 2 continue sliding-scale insulin  5.  Morbid obesity  Poor prognosis   All the records are reviewed and case discussed with Care Management/Social Workerr. Management plans discussed with the patient, family and they are in agreement.  CODE STATUS: full  TOTAL TIME TAKING CARE OF THIS PATIENT: 35 minutes.     Dustin Flock M.D on 05/05/2019 at 2:48 PM  Between 7am to 6pm - Pager - [phone]  After 6pm go to [email or web address] - password [email or web address] [facility]  Office  [phone]  CC: Primary care physician; Smithson, [name], MD   Note: This dictation was prepared with Dragon dictation along with smaller phrase technology. Any transcriptional errors that result from this process are unintentional.

## 2019-05-05 NOTE — Progress Notes (Signed)
HD Tx Start    05/05/19 0915  Vital Signs  Pulse Rate 70  Resp (!) 22  BP (!) 121/52  Oxygen Therapy  SpO2 91 %  End Tidal CO2 (EtCO2) 32  During Hemodialysis Assessment  Blood Flow Rate (mL/min) 400 mL/min  Arterial Pressure (mmHg) -180 mmHg  Venous Pressure (mmHg) 190 mmHg  Transmembrane Pressure (mmHg) 50 mmHg  Ultrafiltration Rate (mL/min) 830 mL/min (863mL per HOUR)  Dialysate Flow Rate (mL/min) 600 ml/min  Conductivity: Machine  14  HD Safety Checks Performed Yes  Dialysis Fluid Bolus Normal Saline  Bolus Amount (mL) 250 mL  Intra-Hemodialysis Comments Tx initiated  Hemodialysis Catheter Left Femoral vein Triple-lumen  Placement Date/Time: 04/20/2019 1315   Placed prior to admission: No  Time Out: Correct patient;Correct site;Correct procedure  Maximum sterile barrier precautions: Hand hygiene;Large sterile sheet;Cap;Mask;Sterile gown;Sterile gloves  Site Prep: Chlorh...  Site Condition No complications  Blue Lumen Status Infusing  Red Lumen Status Infusing

## 2019-05-05 NOTE — Progress Notes (Signed)
Pre HD Tx    05/05/19 0900  Hand-Off documentation  Report given to (Full Name) Trellis Paganini RN   Report received from (Full Name) Delton See RN   Vital Signs  Temp (!) 97.2 F (36.2 C)  Temp Source Axillary  Pulse Rate 66  Pulse Rate Source Monitor  Resp 20  BP (!) 125/53  BP Location Right Wrist  BP Method Automatic  Patient Position (if appropriate) Lying  Oxygen Therapy  SpO2 92 %  O2 Device Ventilator  End Tidal CO2 (EtCO2) 32  Pain Assessment  Pain Intervention(s) Medication (See eMAR);RN made aware  Critical Care Pain Observation Tool (CPOT)  Facial Expression 0  Body Movements 2  Muscle Tension 0  Compliance with ventilator (intubated pts.) 1  Vocalization (extubated pts.) N/A  CPOT Total 3  Dialysis Weight  Weight (!) 145.8 kg  Type of Weight Pre-Dialysis  Time-Out for Hemodialysis  What Procedure? Hemodialysis  Pt Identifiers(min of two) First/Last Name;MRN/Account#  Correct Site? Yes  Correct Side? Yes  Correct Procedure? Yes  Consents Verified? Yes  Rad Studies Available? N/A  Safety Precautions Reviewed? Yes  Engineer, civil (consulting) Number 5  Station Number  (ICU1)  UF/Alarm Test Passed  Conductivity: Meter 14  Conductivity: Machine  13.8  pH 7.2  Reverse Osmosis WRO1  Normal Saline Lot Number [id number]  Dialyzer Lot Number [id number]  Disposable Set Lot Number [id number]  Dialysate Acid Bath Lot Number 19  Machine Temperature 98.6 F (37 C)  Musician and Audible Yes  Blood Lines Intact and Secured Yes  Pre Treatment Patient Checks  Vascular access used during treatment Catheter  Hepatitis B Surface Antigen Results Negative  Date Hepatitis B Surface Antigen Drawn 05/02/19  Hepatitis B Surface Antibody  (<10)  Date Hepatitis B Surface Antibody Drawn 05/02/19  Hemodialysis Consent Verified Yes  Hemodialysis Standing Orders Initiated Yes  ECG (Telemetry) Monitor On Yes  Prime Ordered Normal Saline  Length of  DialysisTreatment  -hour(s) 3 Hour(s)  Dialysis Treatment Comments Na 140  Dialyzer Elisio 17H NR  Dialysate 3K, 2.5 Ca  Dialysate Flow Ordered 600  Blood Flow Rate Ordered 400 mL/min  Ultrafiltration Goal 2 Liters  Dialysis Blood Pressure Support Ordered Normal Saline  Education / Care Plan  Dialysis Education Provided No (Comment) (ventilator)  Hemodialysis Catheter Left Femoral vein Triple-lumen  Placement Date/Time: 04/07/2019 1315   Placed prior to admission: No  Time Out: Correct patient;Correct site;Correct procedure  Maximum sterile barrier precautions: Hand hygiene;Large sterile sheet;Cap;Mask;Sterile gown;Sterile gloves  Site Prep: Chlorh...  Site Condition No complications  Blue Lumen Status Capped (Central line)  Red Lumen Status Capped (Central line)  Dressing Type Biopatch;Occlusive  Dressing Status Clean;Dry;Intact  Drainage Description None

## 2019-05-05 NOTE — Progress Notes (Signed)
Pt extubated without complications, no shortness of breath noted, placed on 4lpm [place], sats 95%, respiratory rate 20/min, will continue to monitor.

## 2019-05-05 NOTE — Progress Notes (Signed)
Nutrition Follow-up  RD working remotely.  DOCUMENTATION CODES:   Morbid obesity  INTERVENTION:  Initiate new goal regimen of Pivot 1.5 Cal at 25 mL/hr (600 mL goal daily volume) + Pro-Stat 60 mL TID. Provides 1500 kcal, 146 grams of protein, 450 mL H2O daily.  Provide B-complex with C daily per tube.  NUTRITION DIAGNOSIS:   Inadequate oral intake related to inability to eat as evidenced by NPO status.  Ongoing - addressing with TF regimen.  GOAL:   Provide needs based on ASPEN/SCCM guidelines  Met with TF regimen.  MONITOR:   Vent status, Labs, Weight trends, TF tolerance, Skin, I & O's  REASON FOR ASSESSMENT:   Ventilator, Consult Enteral/tube feeding initiation and management  ASSESSMENT:   63 year old female with PMHx of DM, ESRD on HD, HTN, gout, hx CVA 2004, PVD, CHF, OSA, COPD, anxiety, GERD, arthritis, neuropathy, hx renal cell carcinoma s/p partial left nephrectomy, CAD admitted after cardiac arrest s/p ACLS and intubation on 5/26, with severe hyperkalemia.  Patient remains intubated and sedated. On spontaneous mode with FiO2 60%, Pressure Support 12 cmH2O, and PEEP 8 cmH2O. Abdomen soft per RN documentation. Last BM 5/26. Patient now on intermittent HD.   Enteral Access: 18 Fr. OGT placed 5/26; terminates in stomach per abdominal x-ray 5/26; 68 cm at corner of mouth  MAP: 74-80 mmHg  TF: pt tolerating VHP at 20 mL/hr + Pro-Stat 60 mL QID  Patient is currently intubated on ventilator support Ve: 9.7 L/min Temp (24hrs), Avg:97.7 F (36.5 C), Min:97.2 F (36.2 C), Max:98 F (36.7 C)  Propofol: N/A  Medications reviewed and include: allopurinol, Colace 200 mg BID, Epogen during HD, famotidine, Novolog 0-9 units Q4hrs, Novolog 3 units Q4hrs, Lantus 5 units QHS, liquid MVI daily per tube, senna QHS, Precedex gtt.  Labs reviewed: CBG 131-155, BUN 98, Creatinine 6.17.  I/O: no urine output  Weight trend: 145.8 kg on 6/2; +6.2 kg from 5/31 likely from  fluid  Diet Order:   Diet Order            Diet NPO time specified  Diet effective midnight             EDUCATION NEEDS:   No education needs have been identified at this time  Skin:  Skin Assessment: Skin Integrity Issues:(MSAD to breasts)  Last BM:  04/13/2019 per chart  Height:   Ht Readings from Last 1 Encounters:  05/03/19 5' 5.98" (1.676 m)   Weight:   Wt Readings from Last 1 Encounters:  05/05/19 (!) 145.8 kg   Ideal Body Weight:  59 kg  BMI:  Body mass index is 51.91 kg/m.  Estimated Nutritional Needs:   Kcal:  1455-1852 (11-14 kcal/kg)  Protein:  148 grams (2.5 grams/kg IBW)  Fluid:  UOP + 1 L  Willey Blade, MS, RD, LDN Office: [phone] Pager: [phone] After Hours/Weekend Pager: [phone]

## 2019-05-05 NOTE — Progress Notes (Signed)
HD Tx End   737mL removed. Unable to reach goal fluid removal of 2 liters d/t hypotensive episodes. Albumin and epogen given during tx. BP stabilized post rinseback to 100/48.    05/05/19 1215  Hand-Off documentation  Report given to (Full Name) Delton See RN   Report received from (Full Name) Trellis Paganini RN  Vital Signs  Temp (!) 97.4 F (36.3 C)  Temp Source Axillary  Pulse Rate (!) 55  Pulse Rate Source Monitor  Resp (!) 26  BP (!) 100/48  BP Location Right Wrist  BP Method Automatic  Patient Position (if appropriate) Lying  Oxygen Therapy  SpO2 100 %  O2 Device Ventilator  End Tidal CO2 (EtCO2) 35  Pain Assessment  Pain Scale CPOT  Critical Care Pain Observation Tool (CPOT)  Facial Expression 0  Body Movements 0  Muscle Tension 0  Compliance with ventilator (intubated pts.) 0  Vocalization (extubated pts.) N/A  CPOT Total 0  Dialysis Weight  Weight (!) 145 kg  Type of Weight Post-Dialysis  During Hemodialysis Assessment  Blood Flow Rate (mL/min) 400 mL/min  Arterial Pressure (mmHg) -190 mmHg  Venous Pressure (mmHg) 190 mmHg  Transmembrane Pressure (mmHg) 50 mmHg  Ultrafiltration Rate (mL/min) 0 mL/min  Dialysate Flow Rate (mL/min) 600 ml/min  Conductivity: Machine  14  HD Safety Checks Performed Yes  Dialysis Fluid Bolus Normal Saline  Bolus Amount (mL) 250 mL  Intra-Hemodialysis Comments Tx completed  Post-Hemodialysis Assessment  Rinseback Volume (mL) 250 mL  Dialyzer Clearance Lightly streaked  Duration of HD Treatment -hour(s) 3 hour(s)  Hemodialysis Intake (mL) 500 mL  UF Total -Machine (mL) 1277 mL  Net UF (mL) 777 mL  Hemodialysis Catheter Left Femoral vein Triple-lumen  Placement Date/Time: 04/25/2019 1315   Placed prior to admission: No  Time Out: Correct patient;Correct site;Correct procedure  Maximum sterile barrier precautions: Hand hygiene;Large sterile sheet;Cap;Mask;Sterile gown;Sterile gloves  Site Prep: Chlorh...  Site Condition No  complications  Blue Lumen Status Flushed;Capped (Central line);Heparin locked  Red Lumen Status Flushed;Capped (Central line);Heparin locked  Catheter fill solution Heparin 1000 units/ml  Catheter fill volume (Arterial) 1.8 cc  Catheter fill volume (Venous) 1.8  Dressing Type Biopatch;Occlusive  Dressing Status Clean;Dry;Intact  Drainage Description None  Post treatment catheter status Capped and Clamped

## 2019-05-05 NOTE — Progress Notes (Signed)
[place] for Electrolyte Monitoring and Replacement   Recent Labs: Potassium (mmol/L)  Date Value  05/05/2019 3.8  03/31/2015 4.2   Magnesium (mg/dL)  Date Value  05/04/2019 2.0  06/14/2014 1.8   Calcium (mg/dL)  Date Value  05/05/2019 7.8 (L)   Calcium, Total (mg/dL)  Date Value  03/31/2015 7.0 (LL)   Albumin (g/dL)  Date Value  05/03/2019 2.3 (L)  03/31/2015 2.3 (L)   Phosphorus (mg/dL)  Date Value  05/03/2019 2.4 (L)  03/31/2015 3.6   Sodium (mmol/L)  Date Value  05/05/2019 136  03/31/2015 129 (L)   Corrected Ca: 9.26 mg/dL  Assessment: Patient is a 63yo female admitted after cardiac arrest.   Pharmacy consulted to manage electrolytes.  Patient is ESRD, CRRT was stopped 5/30 @ 0030 . Now getting HD TTS  Plan:  --All monitored electrolytes today are wnl: no replacement required --Pharmacy will defer electrolytes management to nephrology given that she is a HD patient   Dallie Piles, PharmD Clinical Pharmacist 05/05/2019 11:34 AM

## 2019-05-05 NOTE — Progress Notes (Signed)
CRITICAL CARE NOTE  CC  follow up respiratory failure  SUBJECTIVE Patient remains critically ill Prognosis is guarded Remains on vent   BP 127/62 (BP Location: Right Wrist)   Pulse 65   Temp 97.6 F (36.4 C) (Oral)   Resp (!) 22   Ht 5' 5.98" (1.676 m)   Wt (!) 145.8 kg   SpO2 94%   BMI 51.91 kg/m    I/O last 3 completed shifts: In: 2502.1 [I.V.:1027.4; NG/GT:474.8; IV Piggyback:1000] Out: -  No intake/output data recorded.  SpO2: 94 % O2 Flow Rate (L/min): 3 L/min FiO2 (%): 45 %   SIGNIFICANT EVENTS 5/26 ICU admission for resp failure and cardiac arrest 5/26 hypothermia protocol  REVIEW OF SYSTEMS  PATIENT IS UNABLE TO PROVIDE COMPLETE REVIEW OF SYSTEMS DUE TO SEVERE CRITICAL ILLNESS   PHYSICAL EXAMINATION:  GENERAL:critically ill appearing, +resp distress HEAD: Normocephalic, atraumatic.  EYES: Pupils equal, round, reactive to light.  No scleral icterus.  MOUTH: Moist mucosal membrane. NECK: Supple. No thyromegaly. No nodules. No JVD.  PULMONARY: +rhonchi, +wheezing CARDIOVASCULAR: S1 and S2. Regular rate and rhythm. No murmurs, rubs, or gallops.  GASTROINTESTINAL: Soft, nontender, -distended. No masses. Positive bowel sounds. No hepatosplenomegaly.  MUSCULOSKELETAL: No swelling, clubbing, or edema.  NEUROLOGIC: obtunded, GCS<8 SKIN:intact,warm,dry  MEDICATIONS: I have reviewed all medications and confirmed regimen as documented   CULTURE RESULTS   Recent Results (from the past 240 hour(s))  SARS Coronavirus 2 (CEPHEID - Performed in [place] hospital lab), Hosp Order     Status: None   Collection Time: 04/12/2019  7:31 AM  Result Value Ref Range Status   SARS Coronavirus 2 NEGATIVE NEGATIVE Final    Comment: (NOTE) If result is NEGATIVE SARS-CoV-2 target nucleic acids are NOT DETECTED. The SARS-CoV-2 RNA is generally detectable in upper and lower  respiratory specimens during the acute phase of infection. The lowest  concentration of  SARS-CoV-2 viral copies this assay can detect is 250  copies / mL. A negative result does not preclude SARS-CoV-2 infection  and should not be used as the sole basis for treatment or other  patient management decisions.  A negative result may occur with  improper specimen collection / handling, submission of specimen other  than nasopharyngeal swab, presence of viral mutation(s) within the  areas targeted by this assay, and inadequate number of viral copies  (<250 copies / mL). A negative result must be combined with clinical  observations, patient history, and epidemiological information. If result is POSITIVE SARS-CoV-2 target nucleic acids are DETECTED. The SARS-CoV-2 RNA is generally detectable in upper and lower  respiratory specimens dur ing the acute phase of infection.  Positive  results are indicative of active infection with SARS-CoV-2.  Clinical  correlation with patient history and other diagnostic information is  necessary to determine patient infection status.  Positive results do  not rule out bacterial infection or co-infection with other viruses. If result is PRESUMPTIVE POSTIVE SARS-CoV-2 nucleic acids MAY BE PRESENT.   A presumptive positive result was obtained on the submitted specimen  and confirmed on repeat testing.  While 2019 novel coronavirus  (SARS-CoV-2) nucleic acids may be present in the submitted sample  additional confirmatory testing may be necessary for epidemiological  and / or clinical management purposes  to differentiate between  SARS-CoV-2 and other Sarbecovirus currently known to infect humans.  If clinically indicated additional testing with an alternate test  methodology [phone]) is advised. The SARS-CoV-2 RNA is generally  detectable in upper and lower  respiratory sp ecimens during the acute  phase of infection. The expected result is Negative. Fact Sheet for Patients:  StrictlyIdeas.no Fact Sheet for Healthcare  Providers: BankingDealers.co.za This test is not yet approved or cleared by the [place] FDA and has been authorized for detection and/or diagnosis of SARS-CoV-2 by FDA under an Emergency Use Authorization (EUA).  This EUA will remain in effect (meaning this test can be used) for the duration of the COVID-19 declaration under Section 564(b)(1) of the Act, 21 U.S.C. section 360bbb-3(b)(1), unless the authorization is terminated or revoked sooner. Performed at [facility], [address] [place], [place] [address]   MRSA PCR Screening     Status: None   Collection Time: 04/18/2019  9:04 AM  Result Value Ref Range Status   MRSA by PCR NEGATIVE NEGATIVE Final    Comment:        The GeneXpert MRSA Assay (FDA approved for NASAL specimens only), is one component of a comprehensive MRSA colonization surveillance program. It is not intended to diagnose MRSA infection nor to guide or monitor treatment for MRSA infections. Performed at [facility], [address] [place], [place] [address]   Culture, respiratory (non-expectorated)     Status: None   Collection Time: 05/01/19 11:05 AM  Result Value Ref Range Status   Specimen Description   Final    TRACHEAL ASPIRATE Performed at [facility], [address] [place], [place] [address]    Special Requests   Final    NONE Performed at [facility], [address] [place], [place] [address]    Gram Stain   Final    MODERATE WBC PRESENT, PREDOMINANTLY PMN RARE YEAST Performed at [facility], [place] [address], [place], [place] [address]    Culture FEW CANDIDA ALBICANS FEW CANDIDA TROPICALIS   Final   Report Status 05/04/2019 FINAL  Final             Indwelling Urinary Catheter continued, requirement due to   Reason to continue Indwelling Urinary Catheter strict Intake/Output monitoring for hemodynamic instability   Central Line/ continued, requirement  due to  Reason to continue [place] of central venous pressure or other hemodynamic parameters and poor IV access   Ventilator continued, requirement due to severe respiratory failure   Ventilator Sedation RASS 0 to -2      ASSESSMENT AND PLAN SYNOPSIS  Acute and severe cardiac arrest and severe resp failure with renal failure from NSTEMI and cardiogenic shock  Severe ACUTE Hypoxic and Hypercapnic Respiratory Failure -continue Full MV support -continue Bronchodilator Therapy -Wean Fio2 and PEEP as tolerated -will perform SAT/SBT when respiratory parameters are met  ACUTE SYSTOLIC CARDIAC FAILURE- NSTEMI -oxygen as needed -Lasix as tolerated -follow up cardiac enzymes as indicated -follow up cardiology recs    ACUTE KIDNEY INJURY/Renal Failure On CRRT -follow chem 7 -follow UO -continue Foley Catheter-assess need -Avoid nephrotoxic agents -Recheck creatinine     NEUROLOGY - intubated and sedated - minimal sedation to achieve a RASS goal: -1 Wake up assessment pending   SHOCK-CARDIOGENIC -use vasopressors to keep MAP>65 if needed -follow ABG and LA -follow up cultures -emperic ABX -consider stress dose steroids  CARDIAC ICU monitoring  ID -continue IV abx as prescibed -follow up cultures  GI GI PROPHYLAXIS as indicated  NUTRITIONAL STATUS DIET-->TF's as tolerated Constipation protocol as indicated  ENDO - will use ICU hypoglycemic\Hyperglycemia protocol if indicated   ELECTROLYTES -follow labs as needed -replace as  needed -pharmacy consultation and following   DVT/GI PRX ordered TRANSFUSIONS AS NEEDED MONITOR FSBS ASSESS the need for LABS as needed   Critical Care Time devoted to patient care services described in this note is 34 minutes.   Overall, patient is critically ill, prognosis is guarded.  Patient with Multiorgan failure and at high risk for cardiac arrest and death.    Corrin Parker, M.D.  [facility]  Pulmonary & Critical Care Medicine  Medical Director [place] Director [facility] Cardio-Pulmonary Department

## 2019-05-05 NOTE — Progress Notes (Signed)
Pre HD Assessment    05/05/19 0900  Neurological  Level of Consciousness Responds to Voice  Orientation Level Intubated/Tracheostomy - Unable to assess  Respiratory  Respiratory Pattern Regular  Chest Assessment Chest expansion symmetrical  Bilateral Breath Sounds Diminished  Cough Strong  Cardiac  Pulse Regular  Heart Sounds S1, S2  Jugular Venous Distention (JVD) No  Cardiac Rhythm NSR  Vascular  R Radial Pulse +1  L Radial Pulse +1  Edema Generalized  Generalized Edema +2  RUE Edema +2  Integumentary  Integumentary (WDL) X  Skin Color Appropriate for ethnicity  Skin Condition Dry  Skin Integrity MASD  Additional Integumentary Comments  (HD pt)  Musculoskeletal  Musculoskeletal (WDL) X  Generalized Weakness Yes  Gastrointestinal  Bowel Sounds Assessment Active  GU Assessment  Genitourinary (WDL) X  Genitourinary Symptoms Anuria  Psychosocial  Psychosocial (WDL) X  Patient Behaviors Withdrawn  Needs Expressed Emotional;Physical  Emotional support given Given to patient

## 2019-05-06 ENCOUNTER — Inpatient Hospital Stay: Payer: Medicare Other

## 2019-05-06 LAB — RENAL FUNCTION PANEL
Albumin: 2.8 g/dL — ABNORMAL LOW (ref 3.5–5.0)
Anion gap: 14 (ref 5–15)
BUN: 68 mg/dL — ABNORMAL HIGH (ref 8–23)
CO2: 22 mmol/L (ref 22–32)
Calcium: 7.9 mg/dL — ABNORMAL LOW (ref 8.9–10.3)
Chloride: 100 mmol/L (ref 98–111)
Creatinine, Ser: 5.41 mg/dL — ABNORMAL HIGH (ref 0.44–1.00)
GFR calc Af Amer: 9 mL/min — ABNORMAL LOW (ref >60)
GFR calc non Af Amer: 8 mL/min — ABNORMAL LOW (ref >60)
Glucose, Bld: 139 mg/dL — ABNORMAL HIGH (ref 70–99)
Phosphorus: 4.2 mg/dL (ref 2.5–4.6)
Potassium: 3.8 mmol/L (ref 3.5–5.1)
Sodium: 136 mmol/L (ref 135–145)

## 2019-05-06 LAB — GLUCOSE, CAPILLARY
Glucose-Capillary: 115 mg/dL — ABNORMAL HIGH (ref 70–99)
Glucose-Capillary: 120 mg/dL — ABNORMAL HIGH (ref 70–99)
Glucose-Capillary: 145 mg/dL — ABNORMAL HIGH (ref 70–99)
Glucose-Capillary: 137 mg/dL — ABNORMAL HIGH (ref 70–99)
Glucose-Capillary: 117 mg/dL — ABNORMAL HIGH (ref 70–99)

## 2019-05-06 LAB — CBC
HCT: 27.6 % — ABNORMAL LOW (ref 36.0–46.0)
Hemoglobin: 8.7 g/dL — ABNORMAL LOW (ref 12.0–15.0)
MCH: 29.4 pg (ref 26.0–34.0)
MCHC: 31.5 g/dL (ref 30.0–36.0)
MCV: 93.2 fL (ref 80.0–100.0)
Platelets: 259 10*3/uL (ref 150–400)
RBC: 2.96 MIL/uL — ABNORMAL LOW (ref 3.87–5.11)
RDW: 17.9 % — ABNORMAL HIGH (ref 11.5–15.5)
WBC: 10.9 10*3/uL — ABNORMAL HIGH (ref 4.0–10.5)
nRBC: 2.1 % — ABNORMAL HIGH (ref 0.0–0.2)

## 2019-05-06 LAB — TROPONIN I: Troponin I: 0.03 ng/mL (ref <0.03)

## 2019-05-06 MED ORDER — RENA-VITE PO TABS
1.0000 | ORAL_TABLET | Freq: Every day | ORAL | Status: DC
  Administered 2019-05-06 – 2019-05-07 (×2): 1 via ORAL
  Filled 2019-05-06 (×2): qty 1

## 2019-05-06 MED ORDER — NEPRO/CARBSTEADY PO LIQD
237.0000 mL | Freq: Two times a day (BID) | ORAL | Status: DC
  Administered 2019-05-07: 237 mL via ORAL

## 2019-05-06 MED ORDER — LORATADINE 10 MG PO TABS
10.0000 mg | ORAL_TABLET | Freq: Every day | ORAL | Status: DC
  Administered 2019-05-06 – 2019-05-07 (×2): 10 mg via ORAL
  Filled 2019-05-06 (×3): qty 1

## 2019-05-06 MED ORDER — ALLOPURINOL 300 MG PO TABS
150.0000 mg | ORAL_TABLET | ORAL | Status: DC
  Administered 2019-05-07: 18:00:00 150 mg via ORAL
  Filled 2019-05-06: qty 2

## 2019-05-06 MED ORDER — ALBUMIN HUMAN 25 % IV SOLN
25.0000 g | Freq: Once | INTRAVENOUS | Status: AC
  Administered 2019-05-06: 25 g via INTRAVENOUS
  Filled 2019-05-06: qty 100

## 2019-05-06 MED ORDER — ATORVASTATIN CALCIUM 20 MG PO TABS
40.0000 mg | ORAL_TABLET | Freq: Every day | ORAL | Status: DC
  Administered 2019-05-06 – 2019-05-07 (×2): 40 mg via ORAL
  Filled 2019-05-06 (×2): qty 2

## 2019-05-06 MED ORDER — CITALOPRAM HYDROBROMIDE 20 MG PO TABS
20.0000 mg | ORAL_TABLET | Freq: Every day | ORAL | Status: DC
  Administered 2019-05-07: 20 mg via ORAL
  Filled 2019-05-06: qty 1

## 2019-05-06 MED ORDER — MORPHINE SULFATE (PF) 2 MG/ML IV SOLN
INTRAVENOUS | Status: AC
  Filled 2019-05-06: qty 1

## 2019-05-06 MED ORDER — FAMOTIDINE 20 MG PO TABS
20.0000 mg | ORAL_TABLET | Freq: Every day | ORAL | Status: DC
  Administered 2019-05-07: 20 mg via ORAL
  Filled 2019-05-06: qty 1

## 2019-05-06 MED ORDER — MORPHINE SULFATE (PF) 2 MG/ML IV SOLN
1.0000 mg | INTRAVENOUS | Status: DC | PRN
  Administered 2019-05-06: 1 mg via INTRAVENOUS
  Administered 2019-05-06: 2 mg via INTRAVENOUS
  Filled 2019-05-06: qty 1

## 2019-05-06 MED ORDER — ONDANSETRON HCL 4 MG/2ML IJ SOLN: 4.0000 mg | Freq: Four times a day (QID) | INTRAMUSCULAR | Status: DC | PRN

## 2019-05-06 MED ORDER — CLOPIDOGREL BISULFATE 75 MG PO TABS
75.0000 mg | ORAL_TABLET | Freq: Every day | ORAL | Status: DC
  Administered 2019-05-07: 09:00:00 75 mg via ORAL
  Filled 2019-05-06: qty 1

## 2019-05-06 MED ORDER — DOCUSATE SODIUM 100 MG PO CAPS
200.0000 mg | ORAL_CAPSULE | Freq: Two times a day (BID) | ORAL | Status: DC
  Administered 2019-05-06 – 2019-05-07 (×3): 200 mg via ORAL
  Filled 2019-05-06 (×3): qty 2

## 2019-05-06 MED ORDER — ONDANSETRON HCL 4 MG/2ML IJ SOLN: 4.0000 mg | Freq: Four times a day (QID) | INTRAMUSCULAR | Status: DC

## 2019-05-06 MED ORDER — B COMPLEX-C PO TABS
1.0000 | ORAL_TABLET | Freq: Every day | ORAL | Status: DC
  Filled 2019-05-06: qty 1

## 2019-05-06 MED ORDER — AMITRIPTYLINE HCL 25 MG PO TABS
25.0000 mg | ORAL_TABLET | Freq: Every day | ORAL | Status: DC
  Administered 2019-05-06 – 2019-05-07 (×2): 25 mg via ORAL
  Filled 2019-05-06 (×3): qty 1

## 2019-05-06 NOTE — Progress Notes (Signed)
Pre HD TX : Pt breathing is labored, pt is a mouth breather and is encouraged to close mouth and breathe in through the nose and out through the mouth, when practiced O2 sats improve.   05/06/19 0900  Hand-Off documentation  Report given to (Full Name) Beatris Ship, RN   Report received from (Full Name) Skeet Latch, RN  Vital Signs  Temp 98.9 F (37.2 C)  Temp Source Oral  Pulse Rate (!) 106  Pulse Rate Source Monitor  Resp (!) 21  BP (!) 117/95  BP Location Right Wrist  BP Method Automatic  Patient Position (if appropriate) Lying  Oxygen Therapy  SpO2 92 %  O2 Device Nasal Cannula  O2 Flow Rate (L/min) 4 L/min  Pulse Oximetry Type Continuous  Pain Assessment  Pain Scale 0-10  Pain Score 0  Dialysis Weight  Weight (!) 144.8 kg  Type of Weight Pre-Dialysis  Time-Out for Hemodialysis  What Procedure? HD  Pt Identifiers(min of two) First/Last Name;MRN/Account#  Correct Site? Yes  Correct Side? Yes  Correct Procedure? Yes  Consents Verified? Yes  Rad Studies Available? N/A  Safety Precautions Reviewed? Yes  Engineer, civil (consulting) Number 5  Station Number  (Bedside ICU 1 )  UF/Alarm Test Passed  Conductivity: Meter 14  Conductivity: Machine  14  pH 7.4  Reverse Osmosis WRO 1  Normal Saline Lot Number [id number]  Dialyzer Lot Number [id number]  Disposable Set Lot Number [id number]  Machine Temperature 98.6 F (37 C)  Musician and Audible Yes  Blood Lines Intact and Secured Yes  Pre Treatment Patient Checks  Vascular access used during treatment Catheter  Hepatitis B Surface Antigen Results Negative  Date Hepatitis B Surface Antigen Drawn 05/02/19  Hepatitis B Surface Antibody  (<10)  Date Hepatitis B Surface Antibody Drawn 05/02/19  Hemodialysis Consent Verified Yes  Hemodialysis Standing Orders Initiated Yes  ECG (Telemetry) Monitor On Yes  Prime Ordered Normal Saline  Length of  DialysisTreatment -hour(s) 3 Hour(s)  Dialysis Treatment Comments Na 140   Dialyzer Elisio 17H NR  Dialysate 3K, 2.5 Ca  Dialysis Anticoagulant None  Dialysate Flow Ordered 60  Blood Flow Rate Ordered 400 mL/min  Ultrafiltration Goal 2.5 Liters  Dialysis Blood Pressure Support Ordered Albumin  Education / Care Plan  Dialysis Education Provided Yes  Documented Education in Care Plan Yes  Hemodialysis Catheter Left Femoral vein Triple-lumen  Placement Date/Time: 04/20/2019 1315   Placed prior to admission: No  Time Out: Correct patient;Correct site;Correct procedure  Maximum sterile barrier precautions: Hand hygiene;Large sterile sheet;Cap;Mask;Sterile gown;Sterile gloves  Site Prep: Chlorh...  Site Condition No complications  Blue Lumen Status Blood return noted  Red Lumen Status Blood return noted  Purple Lumen Status Infusing  Dressing Type Biopatch;Occlusive  Dressing Status Clean;Dry;Intact

## 2019-05-06 NOTE — Progress Notes (Signed)
Nutrition Follow-up  RD working remotely.  DOCUMENTATION CODES:   Morbid obesity  INTERVENTION:  Provide Nepro Shake po BID, each supplement provides 425 kcal and 19 grams protein.  Now that patient is able to take PO will switch to Rena-vite QHS.  NUTRITION DIAGNOSIS:   Inadequate oral intake related to inability to eat as evidenced by NPO status.  Resolving - diet has been advanced but intake remains inadequate.  GOAL:   Patient will meet greater than or equal to 90% of their needs  Progressing.  MONITOR:   PO intake, Supplement acceptance, Labs, Weight trends, Skin, I & O's  REASON FOR ASSESSMENT:   Ventilator, Consult Enteral/tube feeding initiation and management  ASSESSMENT:   63 year old female with PMHx of DM, ESRD on HD, HTN, gout, hx CVA 2004, PVD, CHF, OSA, COPD, anxiety, GERD, arthritis, neuropathy, hx renal cell carcinoma s/p partial left nephrectomy, CAD admitted after cardiac arrest s/p ACLS and intubation on 5/26, with severe hyperkalemia.  Patient was extubated yesterday afternoon. Diet was advanced this morning. Following SLP evaluation diet was downgraded to dysphagia 3 with thin liquids. Patient is on 1.2 L fluid restrictions. PO intake is inadequate. She ate 50% of her breakfast this morning and only 25% of her lunch. Underwent HD today.  Medications reviewed and include: allopurinol, B-complex with C QHS, Colace 200 mg BID, Epogen 10000 units during HD, famotidine, Novolog 0-9 units Q4hrs, Novolog 3 units Q4hrs, Lantus 5 units QHS, senna QHS.  Labs reviewed: CBG 115-145, BUN 68, Creatinine 5.41. Potassium and Phosphorus WNL.  I/O: 777 mL removed from HD yesterday  Diet Order:   Diet Order            DIET DYS 3 Room service appropriate? Yes with Assist; Fluid consistency: Thin; Fluid restriction: 1200 mL Fluid  Diet effective now             EDUCATION NEEDS:   No education needs have been identified at this time  Skin:  Skin Assessment:  Skin Integrity Issues:(MSAD to breasts)  Last BM:  04/22/2019 per chart  Height:   Ht Readings from Last 1 Encounters:  05/03/19 5' 5.98" (1.676 m)   Weight:   Wt Readings from Last 1 Encounters:  05/06/19 (!) 144.8 kg   Ideal Body Weight:  59 kg  BMI:  Body mass index is 51.55 kg/m.  Estimated Nutritional Needs:   Kcal:  1455-1852 (11-14 kcal/kg)  Protein:  148 grams (2.5 grams/kg IBW)  Fluid:  UOP + 1 L  Willey Blade, MS, RD, LDN Office: [phone] Pager: [phone] After Hours/Weekend Pager: [phone]

## 2019-05-06 NOTE — Progress Notes (Signed)
[place] [facility] [place] NAME: [name]    MR#:  [id number]  DATE OF BIRTH:  [birth year]  SUBJECTIVE; patient critically ill, intubated, sedated.  Still on low-dose pressors, patient had temp HD catheter placed..  CHIEF COMPLAINT:   Chief Complaint  Patient presents with  . Shortness of Breath  Patient extubated still having shortness of breath Limited historian REVIEW OF SYSTEMS:   Review of Systems  Unable to perform ROS: Acuity of condition    DRUG ALLERGIES:   Allergies  Allergen Reactions  . Iodine Rash  . Enalapril Other (See Comments)    TONGUE SWELLS/GOUT  . Iodinated Diagnostic Agents Other (See Comments)    BREAK OUT IN WHELPS      VITALS:  Blood pressure 138/61, pulse (!) 101, temperature 98.4 F (36.9 C), temperature source Oral, resp. rate (!) 22, height 5' 5.98" (1.676 m), weight (!) 144.8 kg, SpO2 96 %.  PHYSICAL EXAMINATION:  GENERAL:  63 y.o.-morbidly obese chronically appearing EYES: Pupils equal, round, reactive to light  No scleral icterus.  HEENT: Head atraumatic, normocephalic.  Currently intubated, sedated.  Clear.  NECK:  Supple, no jugular venous distention. No thyroid enlargement, no tenderness.  LUNGS: Diminished breath sounds bilaterally. CARDIOVASCULAR: S1, S2 normal. No murmurs, rubs, or gallops.  ABDOMEN: Soft, nontender, nondistended. Bowel sounds present. No organomegaly or mass.  EXTREMITIES: No pedal edema, cyanosis, or clubbing.  NEUROLOGIC: Unable to do full neuro exam because of intubation, sedation. PSYCHIATRIC: Intubated, sedated.    SKIN: No obvious rash, lesion, or ulcer.    LABORATORY PANEL:   CBC Recent Labs  Lab 05/06/19 0437  WBC 10.9*  HGB 8.7*  HCT 27.6*  PLT 259   ------------------------------------------------------------------------------------------------------------------  Chemistries  Recent Labs  Lab 05/03/19 0439 05/04/19 0436  05/06/19 0437   NA 137 136   < > 136  K 3.6 3.7   < > 3.8  CL 101 102   < > 100  CO2 27 25   < > 22  GLUCOSE 206* 222*   < > 139*  BUN 46* 75*   < > 68*  CREATININE 3.40* 4.90*   < > 5.41*  CALCIUM 7.9* 7.9*   < > 7.9*  MG 1.9 2.0  --   --   AST 11*  --   --   --   ALT 14  --   --   --   ALKPHOS 103  --   --   --   BILITOT 0.5  --   --   --    < > = values in this interval not displayed.   ------------------------------------------------------------------------------------------------------------------  Cardiac Enzymes Recent Labs  Lab 05/06/19 0437  TROPONINI 0.03*   ------------------------------------------------------------------------------------------------------------------  RADIOLOGY:  Dg Chest Port 1 View  Result Date: 05/06/2019 CLINICAL DATA:  Shortness of breath EXAM: PORTABLE CHEST 1 VIEW COMPARISON:  Two days ago FINDINGS: Cardiomegaly and vascular pedicle widening. Very low volume chest with hazy opacity on both sides. Dialysis catheter from the left with tip near the SVC origin. Trachea and esophagus have been extubated. IMPRESSION: Unchanged very low lung volumes with atelectasis and vascular congestion. There may be layering pleural effusions. Electronically Signed   By: Monte Fantasia M.D.   On: 05/06/2019 05:31    EKG:   Orders placed or performed during the hospital encounter of 04/09/2019  . EKG 12-Lead  . EKG 12-Lead  . EKG 12-Lead  . EKG 12-Lead  ASSESSMENT AND PLAN:   63 year old female patient with history of CVA, obstructive sleep apnea on CPAP, ESRD on hemodialysis Tuesday, Thursday, Saturday, congestive heart failure, COPD comes in because of worsening shortness of breath, she was called in the emergency room, had PEA, intubated, admitted to intensive care unit.  1 .acute respiratory failure status post PEA,  Status post extubation   2.  Septic shock secondary to HCAP,  status post antibiotics  3.  ESRD, on hemodialysis Hemodialysis as per  nephrology   4.  Diabetes type 2 continue sliding-scale insulin  5.  Morbid obesity  Poor prognosis   All the records are reviewed and case discussed with Care Management/Social Workerr. Management plans discussed with the patient, family and they are in agreement.  CODE STATUS: full  TOTAL TIME TAKING CARE OF THIS PATIENT: 35 minutes.     Dustin Flock M.D on 05/06/2019 at 3:34 PM  Between 7am to 6pm - Pager - [phone]  After 6pm go to [email or web address] - password [email or web address] [facility]  Office  [phone]  CC: Primary care physician; [place]son, [name], MD   Note: This dictation was prepared with Dragon dictation along with smaller phrase technology. Any transcriptional errors that result from this process are unintentional.

## 2019-05-06 NOTE — Progress Notes (Signed)
Spoke with patient's husband, [name], for patient update and status.

## 2019-05-06 NOTE — Progress Notes (Signed)
PT Cancellation Note  Patient Details Name: [name] MRN: [id number] DOB: 12-07-1955   Cancelled Treatment:    Reason Eval/Treat Not Completed: Other (comment). Consult received and chart reviewed. Pt with temp femoral HD cath in place (secondary to clotted AVG) and receiving HD this date with plans to transfer to floor. Unable to perform formal PT assessment. Will hold therapy evaluation at this time and re-attempt.   Ray,Stephanie 05/06/2019, 2:58 PM  Greggory Stallion, PT, DPT [phone]

## 2019-05-06 NOTE — Progress Notes (Signed)
   CHIEF COMPLAINT:   Chief Complaint  Patient presents with  . Shortness of Breath    Subjective  Successfully extubated Alert and awake Plan to advance diet Transfer to gen med floor     Objective   Examination:  General exam: Appears calm and comfortable  Respiratory system: Clear to auscultation. Respiratory effort normal. HEENT: [place]/AT, PERRLA, no thrush, no stridor. Cardiovascular system: S1 & S2 heard, RRR. No JVD, murmurs, rubs, gallops or clicks. No pedal edema. Gastrointestinal system: Abdomen is nondistended, soft and nontender. No organomegaly or masses felt. Normal bowel sounds heard. Central nervous system: Alert and oriented. No focal neurological deficits. Extremities: Symmetric 5 x 5 power. Skin: No rashes, lesions or ulcers Psychiatry: Judgement and insight appear normal. Mood & affect appropriate.   VITALS:  height is 5' 5.98" (1.676 m) and weight is 144.6 kg (abnormal). Her oral temperature is 99.5 F (37.5 C). Her blood pressure is 117/95 (abnormal) and her pulse is 106 (abnormal). Her respiration is 21 (abnormal) and oxygen saturation is 92%.   I personally reviewed Labs under Results section.  Radiology Reports Dg Chest Port 1 View  Result Date: 05/06/2019 CLINICAL DATA:  Shortness of breath EXAM: PORTABLE CHEST 1 VIEW COMPARISON:  Two days ago FINDINGS: Cardiomegaly and vascular pedicle widening. Very low volume chest with hazy opacity on both sides. Dialysis catheter from the left with tip near the SVC origin. Trachea and esophagus have been extubated. IMPRESSION: Unchanged very low lung volumes with atelectasis and vascular congestion. There may be layering pleural effusions. Electronically Signed   By: Monte Fantasia M.D.   On: 05/06/2019 05:31       Assessment/Plan:  RESP FAILURE-RESOLVED SEPTIC SHOCK-RESOLVED HD AS NEEDED PT/OT  ELECTROLYTES -follow labs as needed -replace as needed -pharmacy consultation and following    DVT/GI PRX  ordered TRANSFUSIONS AS NEEDED MONITOR FSBS ASSESS the need for LABS as needed    Transfer to GEN MED floor     Patricia Pesa, M.D.  [facility]  Medical Director [place] Director [facility] Cardio-Pulmonary Department

## 2019-05-06 NOTE — Progress Notes (Signed)
Patient has been confused through out the shift; alert to self, place, disoriented to time and situation.  After bath, patient developed pain which she stated "made it harder to breathe" and "hurt with coughing".  NP was notified and morphine was ordered.  1mg  or morphine was given, which the patient said was "helpful".

## 2019-05-06 NOTE — Evaluation (Signed)
Clinical/Bedside Swallow Evaluation Patient Details  Name: [name] MRN: [id number] Date of Birth: 1956-05-07  Today's Date: 05/06/2019 Time: SLP Start Time (ACUTE ONLY): 1350 SLP Stop Time (ACUTE ONLY): 1450 SLP Time Calculation (min) (ACUTE ONLY): 60 min  Past Medical History:  Past Medical History:  Diagnosis Date  . Anemia   . Anxiety   . Arthritis   . Cancer [facility])    Left Kidney Cancer  . CHF (congestive heart failure) ([place])   . Chronic kidney disease   . COPD (chronic obstructive pulmonary disease) ([facility])    Use Oxygen at bedtime  . Coronary artery disease   . Diabetes mellitus without complication ([place])   . Dialysis patient ([place])    Tues, Thurs, Sat  . Dyspnea    with exertion  . GERD (gastroesophageal reflux disease)   . Gout   . History of kidney stones   . Hypertension   . Neuropathy   . Peripheral vascular disease ([place])   . Sleep apnea    OSA--USE C-PAP  . Stroke [facility]) 2004   Past Surgical History:  Past Surgical History:  Procedure Laterality Date  . A/V FISTULAGRAM Left 04/26/2017   Procedure: A/V Fistulagram;  Surgeon: Katha Cabal, MD;  Location: [place] CV LAB;  Service: Cardiovascular;  Laterality: Left;  . ABDOMINAL HYSTERECTOMY    . CARDIAC CATHETERIZATION    . DIALYSIS/PERMA CATHETER INSERTION N/A 01/31/2017   Procedure: Dialysis/Perma Catheter Insertion;  Surgeon: Algernon Huxley, MD;  Location: [place] CV LAB;  Service: Cardiovascular;  Laterality: N/A;  . DIALYSIS/PERMA CATHETER INSERTION N/A 02/21/2017   Procedure: Dialysis/Perma Catheter Insertion;  Surgeon: Algernon Huxley, MD;  Location: [place] CV LAB;  Service: Cardiovascular;  Laterality: N/A;  . DIALYSIS/PERMA CATHETER INSERTION N/A 03/19/2017   Procedure: Dialysis/Perma Catheter Insertion;  Surgeon: Katha Cabal, MD;  Location: [place] CV LAB;  Service: Cardiovascular;  Laterality: N/A;  . DIALYSIS/PERMA CATHETER REMOVAL N/A 07/19/2017   Procedure:  DIALYSIS/PERMA CATHETER REMOVAL;  Surgeon: Katha Cabal, MD;  Location: [place] CV LAB;  Service: Cardiovascular;  Laterality: N/A;  . EYE SURGERY Bilateral    Cataract Extraction with IOL  . KIDNEY SURGERY Left    Partial Nephrectomy  . LEFT HEART CATH AND CORONARY ANGIOGRAPHY N/A 04/05/2017   Procedure: Left Heart Cath and Coronary Angiography;  Surgeon: Wellington Hampshire, MD;  Location: [place] CV LAB;  Service: Cardiovascular;  Laterality: N/A;  . PERIPHERAL VASCULAR CATHETERIZATION N/A 01/23/2016   Procedure: Dialysis/Perma Catheter Insertion;  Surgeon: Algernon Huxley, MD;  Location: [place] CV LAB;  Service: Cardiovascular;  Laterality: N/A;  . PERIPHERAL VASCULAR CATHETERIZATION Left 10/01/2016   Procedure: A/V Shuntogram/Fistulagram;  Surgeon: Algernon Huxley, MD;  Location: [place] CV LAB;  Service: Cardiovascular;  Laterality: Left;  . PERIPHERAL VASCULAR THROMBECTOMY Left 11/24/2018   Procedure: PERIPHERAL VASCULAR THROMBECTOMY;  Surgeon: Algernon Huxley, MD;  Location: [place] CV LAB;  Service: Cardiovascular;  Laterality: Left;  . UPPER EXTREMITY VENOGRAPHY Bilateral 04/26/2017   Procedure: Upper Extremity Venography;  Surgeon: Katha Cabal, MD;  Location: [place] CV LAB;  Service: Cardiovascular;  Laterality: Bilateral;  . VASCULAR ACCESS DEVICE INSERTION Left 05/22/2017   Procedure: INSERTION OF HERO VASCULAR ACCESS DEVICE;  Surgeon: Katha Cabal, MD;  Location: [facility] ORS;  Service: Vascular;  Laterality: Left;   HPI:  Pt is a 63 y.o. 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 On Saturday, she was unable to be dialyzed due to clotted AV fistula.  Patient was  scheduled to have the AV fistula declotted today at 11 but was unable to make it to the appointment due to worsening respiratory status.  A Code Blue was called in the ED; pt orally intubated then extubated on 05/05/2019.  She is verbally responsive, follows commands. Noted open mouth breathing at baseline; quick to have SOB w/ any exertion including talking, moving about in bed.    Assessment / Plan / Recommendation Clinical Impression  Pt appears to present w/ Mild oropharyngeal phase dysphagia significantly impacted by her declined Respiratory status resulting in open-mouth breathing and min increased WOB/SOB at baseline/with any exertion. ANY increased WOB/SOB can directly impact the timing of the pharyngeal swallow and airway closure thus increase risk for aspiration. Pt also required full assistance w/ feeding as she was unable to feed self. Positioning to be fully upright while in the bed for oral intake is challenged d/t pt's size. With these factors in play, including pt's baseline increased WOB/SOB w/ any exertion and the mouth breathing, pt consumed few po trials of thin liquids via straw, purees and soft solids. Noted NO consistency overt s/s of aspiration during oral intake; mild throat clearing x1 post trial of thin liquids. No decline in O2 sats (92-93%) or change in RR/HR from the baseline. Oral phase c/b increased mastication time/effort w/ the increased texture of soft solids. Foods were alternated; then alternated w/ sip of liquid. Oral clearing was achieved given time. OM exam revealed no unilateral weakness. Pt required FULL feeding assistance d/t UE weakness bilateral. Pt is easily SOB and has WOB w/ any increased exertion from oral intake - Rest Breaks given b/t all trials.  Recommend modifying current diet to a Dysphagia level 3 w/ MINCED meats/gravy added w/ thin liquids for conservation of energy; aspiration precautions; Pills in Puree for safer swallowing at this time. Full feeding  assistance/support - pt would benefit getting out of bed into a chair for meals.  SLP Visit Diagnosis: Dysphagia, oropharyngeal phase (R13.12)    Aspiration Risk  Mild aspiration risk    Diet Recommendation  Dysphagia level 3 w/ MINCED meats, gravy; Thin liquids. General aspiration precautions to include frequent Rest Breaks to lessen WOB/SOB w/ exertion of oral intake. Support w/ feeding at meals - pt would benefit from eating in a chair when able.   Medication Administration: Whole meds with puree(Crushed as needed for safer swallowing)    Other  Recommendations Recommended Consults: (Dietician f/u) Oral Care Recommendations: Oral care BID;Staff/trained caregiver to provide oral care Other Recommendations: (n/a)   Follow up Recommendations None(TBD)      Frequency and Duration min 2x/week  1 week       Prognosis Prognosis for Safe Diet Advancement: Fair Barriers to Reach Goals: Time post onset;Severity of deficits(Respiratory status decline baseline)      Swallow Study   General Date of Onset: 04/30/2019 HPI: Pt is a 63 y.o. 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 On Saturday,  she was unable to be dialyzed due to clotted AV fistula.  Patient was scheduled to have the AV fistula declotted today at 11 but was unable to make it to the appointment due to worsening respiratory status.  A Code Blue was called in the ED; pt orally intubated then extubated on 05/05/2019.  She is verbally responsive, follows commands. Noted open mouth breathing at baseline; quick to have SOB w/ any exertion including talking, moving about in bed.  Type of Study: Bedside Swallow Evaluation Previous Swallow Assessment: none  reported by pt Diet Prior to this Study: Regular;Thin liquids(per MD order) Temperature Spikes Noted: No(wbc 10.9) Respiratory Status: Nasal cannula(4 liters) History of Recent Intubation: Yes Length of Intubations (days): 7 days Date extubated: 05/05/19 Behavior/Cognition: Alert;Cooperative;Pleasant mood(WOB/SOB) Oral Cavity Assessment: Within Functional Limits Oral Care Completed by SLP: Recent completion by staff Oral Cavity - Dentition: Adequate natural dentition;Missing dentition Vision: Functional for self-feeding(assisted SLP) Self-Feeding Abilities: Needs set up;Total assist(d/t overall weakness; bilat. UE weakness) Patient Positioning: Upright in bed(needed full positioning assistance ) Baseline Vocal Quality: Low vocal intensity(min gravely; SOB/WOB; open mouth breather) Volitional Cough: Strong Volitional Swallow: Able to elicit    Oral/Motor/Sensory Function Overall Oral Motor/Sensory Function: Within functional limits   Ice Chips Ice chips: Within functional limits Presentation: Spoon(fed; 3 trials)   Thin Liquid Thin Liquid: Impaired Presentation: Self Fed;Straw(assisted; 8-10 trials) Oral Phase Impairments: (none) Oral Phase Functional Implications: (none) Pharyngeal  Phase Impairments: Throat Clearing - Delayed(x1 post trial - did not increase in frequency) Other Comments: pt is easily SOB and has WOB w/ any increased exertion from oral intake - rest breaks given b/t trials    Nectar Thick Nectar Thick Liquid: Not tested   Honey Thick Honey Thick Liquid: Not tested   Puree Puree: Within functional limits Presentation: Spoon(fed; 4 trials)   Solid     Solid: Impaired Presentation: Spoon(fed; 5 trials) Oral Phase Impairments: Impaired mastication(needed time; mouth breathing) Oral Phase Functional Implications: Impaired mastication;Prolonged oral transit Pharyngeal Phase Impairments: (none)       Orinda Kenner, MS, CCC-SLP Watson,Katherine 05/06/2019,4:01  PM

## 2019-05-06 NOTE — Progress Notes (Signed)
[facility]  ROUNDING NOTE   Subjective:   Patient extubated successfully yesterday Now on [place] O2 States she is still SOB   Objective:  Vital signs in last 24 hours:  Temp:  [97.2 F (36.2 C)-99.5 F (37.5 C)] 99.5 F (37.5 C) (06/03 0736) Pulse Rate:  [55-103] 100 (06/03 0736) Resp:  [13-28] 24 (06/03 0736) BP: (73-156)/(45-89) 130/68 (06/03 0700) SpO2:  [86 %-100 %] 95 % (06/03 0736) FiO2 (%):  [36 %-60 %] 36 % (06/02 1944) Weight:  [144.6 kg-145.8 kg] 144.6 kg (06/03 0500)  Weight change: 0 kg Filed Weights   05/05/19 0900 05/05/19 1215 05/06/19 0500  Weight: (!) 145.8 kg (!) 145 kg (!) 144.6 kg    Intake/Output: I/O last 3 completed shifts: In: 932.4 [I.V.:582.4; IV Piggyback:350] Out: 777 [Other:777]   Intake/Output this shift:  No intake/output data recorded.  Physical Exam: General: Critically ill   HEENT Moist oral mucus membranes  Neck: supple  Lungs:  coarse breath sounds, difficult exam due to body habitus  Heart: Regular rate and rhythm, tachycardic  Abdomen:  Soft, nontender, obese  Extremities:  + peripheral edema.  Neurologic:  Alert and oriented  Skin:  warm  Access: +thrombosed left arm AVG, temp femoral LEFT  HD catheter Dr. Lanney Gins    Basic Metabolic Panel: Recent Labs  Lab 05/01/19 1800 05/01/19 2209 05/02/19 0404 05/02/19 0910 05/03/19 0439 05/04/19 0436 05/05/19 0458 05/06/19 0437  NA 135 135 135 136 137 136 136 136  K 3.4* 3.7 3.8 3.9 3.6 3.7 3.8 3.8  CL 103 102 102 102 101 102 101 100  CO2 26 27 26 26 27 25 25 22   GLUCOSE 202* 204* 194* 193* 206* 222* 177* 139*  BUN 36* 39* 49* 55* 46* 75* 98* 68*  CREATININE 2.20* 2.19* 2.61* 3.15* 3.40* 4.90* 6.17* 5.41*  CALCIUM 7.6* 8.0* 7.6* 7.8* 7.9* 7.9* 7.8* 7.9*  MG 1.7 1.9 1.9  --  1.9 2.0  --   --   PHOS 2.4* 2.6 2.8 2.5 2.4*  --   --  4.2    Liver Function Tests: Recent Labs  Lab 05/01/19 2209 05/02/19 0404 05/02/19 0910 05/03/19 0439 05/06/19 0437  AST   --   --   --  11*  --   ALT  --   --   --  14  --   ALKPHOS  --   --   --  103  --   BILITOT  --   --   --  0.5  --   PROT  --   --   --  5.8*  --   ALBUMIN 2.2* 2.3* 2.2* 2.3* 2.8*   No results for input(s): LIPASE, AMYLASE in the last 168 hours. No results for input(s): AMMONIA in the last 168 hours.  CBC: Recent Labs  Lab 04/30/19 0500 05/01/19 0405 05/02/19 0404 05/02/19 0910 05/03/19 0439 05/05/19 0458 05/06/19 0437  WBC 17.9* 12.9* 9.1 9.9 9.8 11.4* 10.9*  NEUTROABS 15.1* 9.5* 6.5  --   --  7.9*  --   HGB 11.1* 9.4* 8.7* 8.7* 8.3* 8.5* 8.7*  HCT 34.9* 29.9* 27.9* 27.1* 26.8* 26.7* 27.6*  MCV 91.6 92.9 94.3 92.5 95.0 92.4 93.2  PLT 142* 132* 142* 158 170 243 259    Cardiac Enzymes: Recent Labs  Lab 05/06/19 0437  TROPONINI 0.03*    BNP: Invalid input(s): POCBNP  CBG: Recent Labs  Lab 05/05/19 1655 05/05/19 2022 05/05/19 2338 05/06/19 0350 05/06/19 0998  GLUCAP 93 105* 94 115* 120*    Microbiology: Results for orders placed or performed during the hospital encounter of 04/15/2019  SARS Coronavirus 2 (CEPHEID - Performed in [place] hospital lab), Hosp Order     Status: None   Collection Time: 04/26/2019  7:31 AM  Result Value Ref Range Status   SARS Coronavirus 2 NEGATIVE NEGATIVE Final    Comment: (NOTE) If result is NEGATIVE SARS-CoV-2 target nucleic acids are NOT DETECTED. The SARS-CoV-2 RNA is generally detectable in upper and lower  respiratory specimens during the acute phase of infection. The lowest  concentration of SARS-CoV-2 viral copies this assay can detect is 250  copies / mL. A negative result does not preclude SARS-CoV-2 infection  and should not be used as the sole basis for treatment or other  patient management decisions.  A negative result may occur with  improper specimen collection / handling, submission of specimen other  than nasopharyngeal swab, presence of viral mutation(s) within the  areas targeted by this assay, and  inadequate number of viral copies  (<250 copies / mL). A negative result must be combined with clinical  observations, patient history, and epidemiological information. If result is POSITIVE SARS-CoV-2 target nucleic acids are DETECTED. The SARS-CoV-2 RNA is generally detectable in upper and lower  respiratory specimens dur ing the acute phase of infection.  Positive  results are indicative of active infection with SARS-CoV-2.  Clinical  correlation with patient history and other diagnostic information is  necessary to determine patient infection status.  Positive results do  not rule out bacterial infection or co-infection with other viruses. If result is PRESUMPTIVE POSTIVE SARS-CoV-2 nucleic acids MAY BE PRESENT.   A presumptive positive result was obtained on the submitted specimen  and confirmed on repeat testing.  While 2019 novel coronavirus  (SARS-CoV-2) nucleic acids may be present in the submitted sample  additional confirmatory testing may be necessary for epidemiological  and / or clinical management purposes  to differentiate between  SARS-CoV-2 and other Sarbecovirus currently known to infect humans.  If clinically indicated additional testing with an alternate test  methodology [phone]) is advised. The SARS-CoV-2 RNA is generally  detectable in upper and lower respiratory sp ecimens during the acute  phase of infection. The expected result is Negative. Fact Sheet for Patients:  StrictlyIdeas.no Fact Sheet for Healthcare Providers: BankingDealers.co.za This test is not yet approved or cleared by the [place] FDA and has been authorized for detection and/or diagnosis of SARS-CoV-2 by FDA under an Emergency Use Authorization (EUA).  This EUA will remain in effect (meaning this test can be used) for the duration of the COVID-19 declaration under Section 564(b)(1) of the Act, 21 U.S.C. section 360bbb-3(b)(1), unless the  authorization is terminated or revoked sooner. Performed at [facility], [address] [place], [place] [address]   MRSA PCR Screening     Status: None   Collection Time: 04/14/2019  9:04 AM  Result Value Ref Range Status   MRSA by PCR NEGATIVE NEGATIVE Final    Comment:        The GeneXpert MRSA Assay (FDA approved for NASAL specimens only), is one component of a comprehensive MRSA colonization surveillance program. It is not intended to diagnose MRSA infection nor to guide or monitor treatment for MRSA infections. Performed at [facility], [address], [place], [place] [address]   Culture, respiratory (non-expectorated)     Status: None   Collection Time: 05/01/19 11:05 AM  Result Value Ref Range Status   Specimen Description   Final    TRACHEAL ASPIRATE Performed at [facility], [address], [place], [place] [address]    Special Requests   Final    NONE Performed at [facility], [address] [place], [place] [address]    Gram Stain   Final    MODERATE WBC PRESENT, PREDOMINANTLY PMN RARE YEAST Performed at [facility], [place] [address] [place],  [address]    Culture FEW CANDIDA ALBICANS FEW CANDIDA TROPICALIS   Final   Report Status 05/04/2019 FINAL  Final    Coagulation Studies: No results for input(s): LABPROT, INR in the last 72 hours.  Urinalysis: No results for input(s): COLORURINE, LABSPEC, PHURINE, GLUCOSEU, HGBUR, BILIRUBINUR, KETONESUR, PROTEINUR, UROBILINOGEN, NITRITE, LEUKOCYTESUR in the last 72 hours.  Invalid input(s): APPERANCEUR    Imaging: Dg Chest Port 1 View  Result Date: 05/06/2019 CLINICAL DATA:  Shortness of breath EXAM: PORTABLE CHEST 1 VIEW COMPARISON:  Two days ago FINDINGS: Cardiomegaly and vascular pedicle widening. Very low volume chest with hazy opacity on both sides. Dialysis catheter from the left with tip near the SVC origin. Trachea and esophagus have been  extubated. IMPRESSION: Unchanged very low lung volumes with atelectasis and vascular congestion. There may be layering pleural effusions. Electronically Signed   By: Monte Fantasia M.D.   On: 05/06/2019 05:31     Medications:    . allopurinol  150 mg Per Tube Once per day on Tue Thu Sat  . amitriptyline  25 mg Per Tube QHS  . atorvastatin  40 mg Per Tube Daily  . B-complex with vitamin C  1 tablet Per Tube QHS  . Chlorhexidine Gluconate Cloth  6 each Topical Daily  . citalopram  40 mg Per Tube Daily  . clopidogrel  75 mg Per Tube Daily  . docusate  200 mg Per Tube BID  . epoetin (EPOGEN/PROCRIT) injection  10,000 Units Intravenous Q T,Th,Sa-HD  . famotidine  20 mg Per Tube Daily  . fluticasone  2 spray Each Nare Daily  . heparin injection (subcutaneous)  5,000 Units Subcutaneous Q8H  . insulin aspart  0-9 Units Subcutaneous Q4H  . insulin aspart  3 Units Subcutaneous Q4H  . insulin glargine  5 Units Subcutaneous QHS  . loratadine  10 mg Per Tube QHS  . mouth rinse  15 mL Mouth Rinse BID  . midodrine  10 mg Oral TID WC  . senna  1 tablet Oral QHS  . sodium chloride flush  10-40 mL Intracatheter Q12H   albuterol, calcium carbonate, fentaNYL (SUBLIMAZE) injection, lidocaine-prilocaine, morphine injection, nystatin, phenol, polyethylene glycol powder, polyvinyl alcohol, sodium chloride flush  Assessment/ Plan:  Ms. [name] is a 63 y.o. black female with end stage renal disease on hemodialysis, hypertension, CVA, obstructive sleep apnea, peripheral vascular disease, hypertension, gout, GERD, diabetes mellitus type II, COPD, coronary artery disease, congestive heart failure, who  was admitted to [facility] on 04/06/2019 for pneumonia. Acute respiratory failure requiring mechanical ventilation and vasopressors.   TTS [facility] left AVG   1. End stage renal disease  With generalized edema/volume overload Complication of dialysis device, clotted AVG. Temp HD  catheter placed by ICU - Continue intermittent hemodialysis. TTS schedule - Extra HD today for volume removal. UF as tolerated - Appreciate vascular input. Thrombectomy once patient is stable.   2. Anemia of chronic kidney disease:  Lab Results  Component Value Date  HGB 8.7 (L) 05/06/2019   - EPO with HD treatment TTS  3. Sepsis/Hypotension:  - improved - off antibiotics now  4. Acute respiratory failure with cardiopulmonary arrest. Extubated 6/2    LOS: [place] 6/3/20208:39 AM

## 2019-05-06 NOTE — Progress Notes (Signed)
HD TX started: pt blood lines are reversed for sluggish pull from arterial lumen.    05/06/19 0946  Vital Signs  Pulse Rate (!) 106  Pulse Rate Source Monitor  Resp (!) 32  BP (!) 125/55  BP Location Right Wrist  BP Method Automatic  Patient Position (if appropriate) Lying  Oxygen Therapy  SpO2 91 %  O2 Device Nasal Cannula  O2 Flow Rate (L/min) 4 L/min  Pulse Oximetry Type Continuous  During Hemodialysis Assessment  Blood Flow Rate (mL/min) 400 mL/min  Arterial Pressure (mmHg) -190 mmHg  Venous Pressure (mmHg) 190 mmHg  Transmembrane Pressure (mmHg) 50 mmHg  Ultrafiltration Rate (mL/min) 1000 mL/min  Dialysate Flow Rate (mL/min) 600 ml/min  Conductivity: Machine  14  HD Safety Checks Performed Yes  Dialysis Fluid Bolus Normal Saline  Bolus Amount (mL) 250 mL  Intra-Hemodialysis Comments Tx initiated

## 2019-05-06 NOTE — Progress Notes (Signed)
Pre HD Assessment    05/06/19 0935  Neurological  Level of Consciousness Alert  Orientation Level Oriented to person;Oriented to place  Respiratory  Respiratory Pattern Labored  Chest Assessment Chest expansion symmetrical  Bilateral Breath Sounds Diminished  Cough Non-productive  Cardiac  Pulse Regular  Heart Sounds S1, S2  Cardiac Rhythm ST  Vascular  R Radial Pulse +2  L Radial Pulse +2  Edema Generalized  Generalized Edema +2  Psychosocial  Psychosocial (WDL) WDL  Patient Behaviors Calm;Cooperative  Emotional support given Given to patient

## 2019-05-06 NOTE — Progress Notes (Signed)
HD Tx completed, tolerated well, under UF goal due to shortened TX.   05/06/19 1145  Vital Signs  Pulse Rate 100  Pulse Rate Source Monitor  Resp (!) 26  BP (!) 126/54  Oxygen Therapy  SpO2 91 %  During Hemodialysis Assessment  HD Safety Checks Performed Yes  KECN 32.7 KECN  Dialysis Fluid Bolus Normal Saline  Bolus Amount (mL) 250 mL  Intra-Hemodialysis Comments Tx completed;Tolerated well (MD verbal order change to end tx now. )

## 2019-05-06 NOTE — Progress Notes (Signed)
Pt doing much better this am. Will start on diet, order PT and transfer to med- surg. Will also receive dialysis again today.

## 2019-05-06 NOTE — Progress Notes (Signed)
Post HD Assessment    05/06/19 1150  Neurological  Level of Consciousness Alert  Orientation Level Oriented to person;Oriented to place  Respiratory  Respiratory Pattern Labored  Chest Assessment Chest expansion symmetrical  Bilateral Breath Sounds Diminished;Expiratory wheezes  Cough Non-productive  Cardiac  Pulse Regular  Heart Sounds S1, S2  Cardiac Rhythm ST  Vascular  R Radial Pulse +2  L Radial Pulse +2  Edema Generalized  Generalized Edema +2  Psychosocial  Psychosocial (WDL) WDL  Patient Behaviors Calm;Cooperative  Emotional support given Given to patient

## 2019-05-06 NOTE — Progress Notes (Signed)
Post HD Tx    05/06/19 1200  Hand-Off documentation  Report given to (Full Name) Skeet Latch, RN   Report received from (Full Name) Beatris Ship, RN   Vital Signs  Temp 98.3 F (36.8 C)  Temp Source Oral  Pulse Rate (!) 104  Pulse Rate Source Monitor  Resp (!) 30  BP (!) 147/72  BP Location Right Wrist  BP Method Automatic  Patient Position (if appropriate) Lying  Oxygen Therapy  SpO2 (!) 89 %  O2 Device Nasal Cannula  O2 Flow Rate (L/min) 3.5 L/min  Pulse Oximetry Type Continuous  Pain Assessment  Pain Scale 0-10  Pain Score 0  Post-Hemodialysis Assessment  Rinseback Volume (mL) 250 mL  KECN 32.7 V  Dialyzer Clearance Lightly streaked  Duration of HD Treatment -hour(s) 2 hour(s)  Hemodialysis Intake (mL) 500 mL  UF Total -Machine (mL) 1596 mL  Net UF (mL) 1096 mL  Tolerated HD Treatment Yes  Hemodialysis Catheter Left Femoral vein Triple-lumen  Placement Date/Time: 04/14/2019 1315   Placed prior to admission: No  Time Out: Correct patient;Correct site;Correct procedure  Maximum sterile barrier precautions: Hand hygiene;Large sterile sheet;Cap;Mask;Sterile gown;Sterile gloves  Site Prep: Chlorh...  Site Condition No complications  Blue Lumen Status Heparin locked  Red Lumen Status Heparin locked  Purple Lumen Status Capped (Central line)  Catheter fill solution Heparin 1000 units/ml  Catheter fill volume (Arterial) 1.8 cc  Catheter fill volume (Venous) 1.8  Dressing Type Biopatch;Occlusive  Dressing Status Clean;Dry;Intact  Drainage Description None  Dressing Change Due 05/13/19  Post treatment catheter status Capped and Clamped

## 2019-05-07 ENCOUNTER — Other Ambulatory Visit (INDEPENDENT_AMBULATORY_CARE_PROVIDER_SITE_OTHER): Payer: Self-pay | Admitting: Vascular Surgery

## 2019-05-07 ENCOUNTER — Inpatient Hospital Stay: Payer: Medicare Other

## 2019-05-07 LAB — GLUCOSE, CAPILLARY
Glucose-Capillary: 107 mg/dL — ABNORMAL HIGH (ref 70–99)
Glucose-Capillary: 107 mg/dL — ABNORMAL HIGH (ref 70–99)
Glucose-Capillary: 118 mg/dL — ABNORMAL HIGH (ref 70–99)
Glucose-Capillary: 123 mg/dL — ABNORMAL HIGH (ref 70–99)
Glucose-Capillary: 124 mg/dL — ABNORMAL HIGH (ref 70–99)

## 2019-05-07 MED ORDER — POLYETHYLENE GLYCOL 3350 17 G PO PACK
17.0000 g | PACK | Freq: Three times a day (TID) | ORAL | Status: DC | PRN
  Administered 2019-05-11: 17 g via ORAL
  Filled 2019-05-07: qty 1

## 2019-05-07 NOTE — Progress Notes (Signed)
PT Cancellation Note  Patient Details Name: [name] MRN: [id number] DOB: 1955/12/07   Cancelled Treatment:    Reason Eval/Treat Not Completed: Patient at procedure or test/unavailable.  Per pt's nurse, pt currently off floor at dialysis; also possible plan for Permcath insertion tomorrow.  Will re-attempt PT evaluation at a late date/time.  Leitha Bleak, PT 05/07/19, 1:00 PM [phone]

## 2019-05-07 NOTE — Care Management Important Message (Signed)
Important Message  Patient Details  Name: [name] MRN: [id number] Date of Birth: 09/30/1956   Medicare Important Message Given:  Yes    Dannette Barbara 05/07/2019, 2:19 PM

## 2019-05-07 NOTE — Progress Notes (Addendum)
[facility] [place] NAME: [name]    MR#:  [id number]  DATE OF BIRTH:  1956/11/25    CHIEF COMPLAINT:   Chief Complaint  Patient presents with  . Shortness of Breath  Patient doing better shortness of breath is improved   REVIEW OF SYSTEMS:    CONSTITUTIONAL: No documented fever. No fatigue, weakness. No weight gain, no weight loss.  EYES: No blurry or double vision.  ENT: No tinnitus. No postnasal drip. No redness of the oropharynx.  RESPIRATORY: No cough, no wheeze, no hemoptysis.  Positive dyspnea.  CARDIOVASCULAR: No chest pain. No orthopnea. No palpitations. No syncope.  GASTROINTESTINAL: No nausea, no vomiting or diarrhea. No abdominal pain. No melena or hematochezia.  GENITOURINARY:  No urgency. No frequency. No dysuria. No hematuria. No obstructive symptoms. No discharge. No pain. No significant abnormal bleeding ENDOCRINE: No polyuria or nocturia. No heat or cold intolerance.  HEMATOLOGY: No anemia. No bruising. No bleeding. No purpura. No petechiae INTEGUMENTARY: No rashes. No lesions.  MUSCULOSKELETAL: No arthritis.  Positive swelling of lower extremity. No gout.  NEUROLOGIC: No numbness, tingling, or ataxia. No seizure-type activity.  PSYCHIATRIC: No anxiety. No insomnia. No ADD.          DRUG ALLERGIES:   Allergies  Allergen Reactions  . Iodine Rash  . Enalapril Other (See Comments)    TONGUE SWELLS/GOUT  . Iodinated Diagnostic Agents Other (See Comments)    BREAK OUT IN WHELPS      VITALS:  Blood pressure 120/75, pulse (!) 102, temperature 99 F (37.2 C), temperature source Oral, resp. rate (!) 24, height 5' 5.98" (1.676 m), weight (!) 144.8 kg, SpO2 93 %.  PHYSICAL EXAMINATION:  GENERAL:  63 y.o.-morbidly obese chronically appearing morbidly obese female EYES: Pupils equal, round, reactive to light  No scleral icterus.  HEENT: Head atraumatic, normocephalic.  Currently intubated,  sedated.  Clear.  NECK:  Supple, no jugular venous distention. No thyroid enlargement, no tenderness.  LUNGS: Diminished breath sounds bilaterally. CARDIOVASCULAR: S1, S2 normal. No murmurs, rubs, or gallops.  ABDOMEN: Soft, nontender, nondistended. Bowel sounds present. No organomegaly or mass.  EXTREMITIES: Positive pedal edema, cyanosis, or clubbing.  NEUROLOGIC: Unable to do full neuro exam because of intubation, sedation. PSYCHIATRIC: Intubated, sedated.    SKIN: No obvious rash, lesion, or ulcer.    LABORATORY PANEL:   CBC Recent Labs  Lab 05/06/19 0437  WBC 10.9*  HGB 8.7*  HCT 27.6*  PLT 259   ------------------------------------------------------------------------------------------------------------------  Chemistries  Recent Labs  Lab 05/03/19 0439 05/04/19 0436  05/06/19 0437  NA 137 136   < > 136  K 3.6 3.7   < > 3.8  CL 101 102   < > 100  CO2 27 25   < > 22  GLUCOSE 206* 222*   < > 139*  BUN 46* 75*   < > 68*  CREATININE 3.40* 4.90*   < > 5.41*  CALCIUM 7.9* 7.9*   < > 7.9*  MG 1.9 2.0  --   --   AST 11*  --   --   --   ALT 14  --   --   --   ALKPHOS 103  --   --   --   BILITOT 0.5  --   --   --    < > = values in this interval not displayed.   ------------------------------------------------------------------------------------------------------------------  Cardiac Enzymes Recent Labs  Lab 05/06/19 [phone]  TROPONINI 0.03*   ------------------------------------------------------------------------------------------------------------------  RADIOLOGY:  Dg Chest Port 1 View  Result Date: 05/07/2019 CLINICAL DATA:  Shortness of breath. EXAM: PORTABLE CHEST 1 VIEW COMPARISON:  Radiographs of May 06, 2019. FINDINGS: Stable cardiomediastinal silhouette. Left internal jugular catheter is unchanged. No pneumothorax or significant pleural effusion is noted. Minimal bibasilar subsegmental atelectasis is noted. Bony thorax is unremarkable. IMPRESSION: Minimal  bibasilar subsegmental atelectasis. Electronically Signed   By: Marijo Conception M.D.   On: 05/07/2019 09:55   Dg Chest Port 1 View  Result Date: 05/06/2019 CLINICAL DATA:  Shortness of breath EXAM: PORTABLE CHEST 1 VIEW COMPARISON:  Two days ago FINDINGS: Cardiomegaly and vascular pedicle widening. Very low volume chest with hazy opacity on both sides. Dialysis catheter from the left with tip near the SVC origin. Trachea and esophagus have been extubated. IMPRESSION: Unchanged very low lung volumes with atelectasis and vascular congestion. There may be layering pleural effusions. Electronically Signed   By: Monte Fantasia M.D.   On: 05/06/2019 05:31    EKG:   Orders placed or performed during the hospital encounter of 04/16/2019  . EKG 12-Lead  . EKG 12-Lead  . EKG 12-Lead  . EKG 12-Lead    ASSESSMENT AND PLAN:   63 year old female patient with history of CVA, obstructive sleep apnea on CPAP, ESRD on hemodialysis Tuesday, Thursday, Saturday, congestive heart failure, COPD comes in because of worsening shortness of breath, she was called in the emergency room, had PEA, intubated, admitted to intensive care unit.  1 .acute respiratory failure status post PEA,  Status post extubation Continue oxygen  2.  Septic shock secondary to HCAP,  status post antibiotics  3.  ESRD, on hemodialysis Patient will have temporary dialysis catheter removed tomorrow She will have PermCath placed  4.  Diabetes type 2 continue sliding-scale insulin and Lantus blood glucose are stable  5.  Morbid obesity weight loss recommended  6.  Hyperlipidemia continue Lipitor  7.  Previous history of stroke continue Plavix  8.  Hypotension currently on midodrine  9.  COPD continue nebs  10.  Anemia due to anemia of chronic disease   Poor prognosis   All the records are reviewed and case discussed with Care Management/Social Workerr. Management plans discussed with the patient, family and they are in  agreement.  CODE STATUS: full  TOTAL TIME TAKING CARE OF THIS PATIENT: 35 minutes.     Dustin Flock M.D on 05/07/2019 at 11:59 AM  Between 7am to 6pm - Pager - [phone]  After 6pm go to [email or web address] - password [email or web address] [facility]  Office  [phone]  CC: Primary care physician; Smithson, [name], MD   Note: This dictation was prepared with Dragon dictation along with smaller phrase technology. Any transcriptional errors that result from this process are unintentional.

## 2019-05-07 NOTE — Progress Notes (Signed)
[facility]  ROUNDING NOTE   Subjective:   Patient seen during dialysis Tolerating well    HEMODIALYSIS FLOWSHEET:  Blood Flow Rate (mL/min): 400 mL/min Arterial Pressure (mmHg): -190 mmHg Venous Pressure (mmHg): 190 mmHg Transmembrane Pressure (mmHg): 50 mmHg Ultrafiltration Rate (mL/min): 1000 mL/min Dialysate Flow Rate (mL/min): 600 ml/min Conductivity: Machine : 14 Conductivity: Machine : 14 Dialysis Fluid Bolus: Normal Saline Bolus Amount (mL): 250 mL    Objective:  Vital signs in last 24 hours:  Temp:  [98 F (36.7 C)-99 F (37.2 C)] 98 F (36.7 C) (06/04 1452) Pulse Rate:  [99-102] 100 (06/04 1452) Resp:  [18-26] 18 (06/04 1452) BP: (93-138)/(69-90) 93/69 (06/04 1452) SpO2:  [90 %-97 %] 97 % (06/04 1452)  Weight change: -1 kg Filed Weights   05/05/19 1215 05/06/19 0500 05/06/19 0900  Weight: (!) 145 kg (!) 144.6 kg (!) 144.8 kg    Intake/Output: I/O last 3 completed shifts: In: 410 [P.O.:360; IV Piggyback:50] Out: 1096 [Other:1096]   Intake/Output this shift:  Total I/O In: 120 [P.O.:120] Out: 0   Physical Exam: General: Chronically ill appearing  HEENT Moist oral mucus membranes  Neck: supple  Lungs:  coarse breath sounds, difficult exam due to body habitus  Heart: Regular rate and rhythm, tachycardic  Abdomen:  Soft, nontender, obese  Extremities:  + peripheral edema.  Neurologic:  Alert and oriented  Skin:  warm  Access: +thrombosed left arm AVG, temp femoral LEFT  HD catheter Dr. Lanney Gins    Basic Metabolic Panel: Recent Labs  Lab 05/01/19 1800 05/01/19 2209 05/02/19 0404 05/02/19 0910 05/03/19 0439 05/04/19 0436 05/05/19 0458 05/06/19 0437  NA 135 135 135 136 137 136 136 136  K 3.4* 3.7 3.8 3.9 3.6 3.7 3.8 3.8  CL 103 102 102 102 101 102 101 100  CO2 26 27 26 26 27 25 25 22   GLUCOSE 202* 204* 194* 193* 206* 222* 177* 139*  BUN 36* 39* 49* 55* 46* 75* 98* 68*  CREATININE 2.20* 2.19* 2.61* 3.15* 3.40* 4.90* 6.17*  5.41*  CALCIUM 7.6* 8.0* 7.6* 7.8* 7.9* 7.9* 7.8* 7.9*  MG 1.7 1.9 1.9  --  1.9 2.0  --   --   PHOS 2.4* 2.6 2.8 2.5 2.4*  --   --  4.2    Liver Function Tests: Recent Labs  Lab 05/01/19 2209 05/02/19 0404 05/02/19 0910 05/03/19 0439 05/06/19 0437  AST  --   --   --  11*  --   ALT  --   --   --  14  --   ALKPHOS  --   --   --  103  --   BILITOT  --   --   --  0.5  --   PROT  --   --   --  5.8*  --   ALBUMIN 2.2* 2.3* 2.2* 2.3* 2.8*   No results for input(s): LIPASE, AMYLASE in the last 168 hours. No results for input(s): AMMONIA in the last 168 hours.  CBC: Recent Labs  Lab 05/01/19 0405 05/02/19 0404 05/02/19 0910 05/03/19 0439 05/05/19 0458 05/06/19 0437  WBC 12.9* 9.1 9.9 9.8 11.4* 10.9*  NEUTROABS 9.5* 6.5  --   --  7.9*  --   HGB 9.4* 8.7* 8.7* 8.3* 8.5* 8.7*  HCT 29.9* 27.9* 27.1* 26.8* 26.7* 27.6*  MCV 92.9 94.3 92.5 95.0 92.4 93.2  PLT 132* 142* 158 170 243 259    Cardiac Enzymes: Recent Labs  Lab 05/06/19 0437  TROPONINI 0.03*    BNP: Invalid input(s): POCBNP  CBG: Recent Labs  Lab 05/06/19 1950 05/07/19 0015 05/07/19 0435 05/07/19 0742 05/07/19 1629  GLUCAP 117* 118* 107* 124* 123*    Microbiology: Results for orders placed or performed during the hospital encounter of 04/17/2019  SARS Coronavirus 2 (CEPHEID - Performed in [facility] hospital lab), Hosp Order     Status: None   Collection Time: 04/10/2019  7:31 AM  Result Value Ref Range Status   SARS Coronavirus 2 NEGATIVE NEGATIVE Final    Comment: (NOTE) If result is NEGATIVE SARS-CoV-2 target nucleic acids are NOT DETECTED. The SARS-CoV-2 RNA is generally detectable in upper and lower  respiratory specimens during the acute phase of infection. The lowest  concentration of SARS-CoV-2 viral copies this assay can detect is 250  copies / mL. A negative result does not preclude SARS-CoV-2 infection  and should not be used as the sole basis for treatment or other  patient management  decisions.  A negative result may occur with  improper specimen collection / handling, submission of specimen other  than nasopharyngeal swab, presence of viral mutation(s) within the  areas targeted by this assay, and inadequate number of viral copies  (<250 copies / mL). A negative result must be combined with clinical  observations, patient history, and epidemiological information. If result is POSITIVE SARS-CoV-2 target nucleic acids are DETECTED. The SARS-CoV-2 RNA is generally detectable in upper and lower  respiratory specimens dur ing the acute phase of infection.  Positive  results are indicative of active infection with SARS-CoV-2.  Clinical  correlation with patient history and other diagnostic information is  necessary to determine patient infection status.  Positive results do  not rule out bacterial infection or co-infection with other viruses. If result is PRESUMPTIVE POSTIVE SARS-CoV-2 nucleic acids MAY BE PRESENT.   A presumptive positive result was obtained on the submitted specimen  and confirmed on repeat testing.  While 2019 novel coronavirus  (SARS-CoV-2) nucleic acids may be present in the submitted sample  additional confirmatory testing may be necessary for epidemiological  and / or clinical management purposes  to differentiate between  SARS-CoV-2 and other Sarbecovirus currently known to infect humans.  If clinically indicated additional testing with an alternate test  methodology [phone]) is advised. The SARS-CoV-2 RNA is generally  detectable in upper and lower respiratory sp ecimens during the acute  phase of infection. The expected result is Negative. Fact Sheet for Patients:  StrictlyIdeas.no Fact Sheet for Healthcare Providers: BankingDealers.co.za This test is not yet approved or cleared by the [place] FDA and has been authorized for detection and/or diagnosis of SARS-CoV-2 by FDA under an  Emergency Use Authorization (EUA).  This EUA will remain in effect (meaning this test can be used) for the duration of the COVID-19 declaration under Section 564(b)(1) of the Act, 21 U.S.C. section 360bbb-3(b)(1), unless the authorization is terminated or revoked sooner. Performed at [facility], [address] [place], [place] [address]   MRSA PCR Screening     Status: None   Collection Time: 04/10/2019  9:04 AM  Result Value Ref Range Status   MRSA by PCR NEGATIVE NEGATIVE Final    Comment:        The GeneXpert MRSA Assay (FDA approved for NASAL specimens only), is one component of a comprehensive MRSA colonization surveillance program. It is not intended to diagnose MRSA infection nor to guide or monitor treatment for MRSA infections. Performed at [facility],  [place], [place] [address]   Culture, respiratory (non-expectorated)     Status: None   Collection Time: 05/01/19 11:05 AM  Result Value Ref Range Status   Specimen Description   Final    TRACHEAL ASPIRATE Performed at [facility], [address], [place], [place] [address]    Special Requests   Final    NONE Performed at [facility], [address] [place], [place] [address]    Gram Stain   Final    MODERATE WBC PRESENT, PREDOMINANTLY PMN RARE YEAST Performed at [facility], [place] [address], [place], [place] [address]    Culture FEW CANDIDA ALBICANS FEW CANDIDA TROPICALIS   Final   Report Status 05/04/2019 FINAL  Final    Coagulation Studies: No results for input(s): LABPROT, INR in the last 72 hours.  Urinalysis: No results for input(s): COLORURINE, LABSPEC, PHURINE, GLUCOSEU, HGBUR, BILIRUBINUR, KETONESUR, PROTEINUR, UROBILINOGEN, NITRITE, LEUKOCYTESUR in the last 72 hours.  Invalid input(s): APPERANCEUR    Imaging: Dg Chest Port 1 View  Result Date: 05/07/2019 CLINICAL DATA:  Shortness of breath. EXAM: PORTABLE CHEST 1 VIEW  COMPARISON:  Radiographs of May 06, 2019. FINDINGS: Stable cardiomediastinal silhouette. Left internal jugular catheter is unchanged. No pneumothorax or significant pleural effusion is noted. Minimal bibasilar subsegmental atelectasis is noted. Bony thorax is unremarkable. IMPRESSION: Minimal bibasilar subsegmental atelectasis. Electronically Signed   By: Marijo Conception M.D.   On: 05/07/2019 09:55   Dg Chest Port 1 View  Result Date: 05/06/2019 CLINICAL DATA:  Shortness of breath EXAM: PORTABLE CHEST 1 VIEW COMPARISON:  Two days ago FINDINGS: Cardiomegaly and vascular pedicle widening. Very low volume chest with hazy opacity on both sides. Dialysis catheter from the left with tip near the SVC origin. Trachea and esophagus have been extubated. IMPRESSION: Unchanged very low lung volumes with atelectasis and vascular congestion. There may be layering pleural effusions. Electronically Signed   By: Monte Fantasia M.D.   On: 05/06/2019 05:31     Medications:    . allopurinol  150 mg Oral Once per day on Tue Thu Sat  . amitriptyline  25 mg Oral QHS  . atorvastatin  40 mg Oral Daily  . Chlorhexidine Gluconate Cloth  6 each Topical Daily  . citalopram  20 mg Oral Daily  . clopidogrel  75 mg Oral Daily  . docusate sodium  200 mg Oral BID  . epoetin (EPOGEN/PROCRIT) injection  10,000 Units Intravenous Q T,Th,Sa-HD  . famotidine  20 mg Oral Daily  . feeding supplement (NEPRO CARB STEADY)  237 mL Oral BID BM  . fluticasone  2 spray Each Nare Daily  . heparin injection (subcutaneous)  5,000 Units Subcutaneous Q8H  . insulin aspart  0-9 Units Subcutaneous Q4H  . insulin aspart  3 Units Subcutaneous Q4H  . insulin glargine  5 Units Subcutaneous QHS  . loratadine  10 mg Oral QHS  . mouth rinse  15 mL Mouth Rinse BID  . midodrine  10 mg Oral TID WC  . multivitamin  1 tablet Oral QHS  . senna  1 tablet Oral QHS  . sodium chloride flush  10-40 mL Intracatheter Q12H   albuterol, calcium carbonate,  fentaNYL (SUBLIMAZE) injection, lidocaine-prilocaine, morphine injection, nystatin, ondansetron (ZOFRAN) IV, phenol, polyethylene glycol, polyvinyl alcohol, sodium chloride flush  Assessment/ Plan:  Ms. [name] is a 63 y.o. black female with end stage renal disease on hemodialysis, hypertension, CVA, obstructive sleep apnea, peripheral vascular disease,  hypertension, gout, GERD, diabetes mellitus type II, COPD, coronary artery disease, congestive heart failure, who  was admitted to [facility] on 04/10/2019 for pneumonia. Acute respiratory failure requiring mechanical ventilation and vasopressors.   TTS [facility]rology [facility] left AVG   1. End stage renal disease  With generalized edema/volume overload Complication of dialysis device, clotted AVG. Temp HD catheter placed by ICU - Continue intermittent hemodialysis. TTS schedule -  Plan for permcath tomorrow  2. Anemia of chronic kidney disease:  Lab Results  Component Value Date   HGB 8.7 (L) 05/06/2019   - EPO with HD treatment TTS  3. Sepsis/Hypotension:  - improved - off antibiotics now  4. Acute respiratory failure with cardiopulmonary arrest. Extubated 6/2 Volume removal as tolerated    LOS: 9 Harmeet Singh 6/4/20205:41 PM

## 2019-05-07 NOTE — Progress Notes (Signed)
[facility]   Communication Note 1) Plan on Permcath Insertion with Dr. Delana Meyer tomorrow (Friday). 2) Will pre-op.  Discussed with Dr. Eber Hong Stegmayer PA-C 05/07/2019 11:12 AM

## 2019-05-07 NOTE — Progress Notes (Signed)
Advanced care plan.  Purpose of the Encounter: CODE STATUS  Parties in Attendance: Patient herself  Patient's Decision Capacity: Intact  Subjective/Patient's story: Patient is 63 year old morbidly obese female with history of previous CVA, hypertension, diabetes, COPD, sleep apnea who has been hospitalized with pulseless electrical activity and noted to have acute respiratory failure requiring intubation.  Patient is now extubated.  Patient also initiated on dialysis.   Objective/Medical story Due to multiple comorbidities and multiple medical problems I discussed with the patient regarding her desires for cardiac and pulmonary resuscitation. Also recommended that she have a living will and healthcare partner turning.  Goals of care determination:  I discussed with the patient regarding overall poor prognosis she states that she wants everything to be done and wants to be a full code   CODE STATUS: Full code   Time spent discussing advanced care planning: 16 minutes

## 2019-05-07 NOTE — Progress Notes (Signed)
Temporary dialysis catheter removed per MD order.  No complications noted; pressure applied until hemostasis noted.  Dressing applied.  Continue to monitor.

## 2019-05-07 NOTE — Progress Notes (Addendum)
Speech Language Pathology Treatment: Dysphagia  Patient Details Name: ALLANNA BRESEE MRN: [id number] DOB: 1955/12/14 Today's Date: 05/07/2019 Time: 0900-0930 SLP Time Calculation (min) (ACUTE ONLY): 30 min  Assessment / Plan / Recommendation Clinical Impression  Pt seen today for ongoing toleration of diet; education on general precautions. Pt seen during [place] time as she was taking medications and awaiting her breakfast meal. Pt verbally conversive but continues to present w/ open-mouth breathing at rest and during any exertion (which increased respiratory effort somewhat). Pt appears to have some challenge moving about in bed d/t her underlying weakness and her size and requires full support to position more upright.  Pt was educated on aspiration precautions to include: small, single sips slowly and to take Rest Breaks to avoid WOB/SOB w/ oral intake - pt gave verbal agreement and verbally recalled the precautions w/ SLP. She then consumed sips of thin liquids via straw w/ no overt s/s of aspiration noted; vocal quality clear post trials. W/ the Rest Breaks, no gross decline in respiratory status noted during/post trials. She then swallowed Pills WHOLE in PUREE for safer swallowing as recommended by SLP d/t the declined Pulmonary status. No overt s/s of aspiration noted; vocal quality clear post trials. Pt required full feeding assistance d/t weakness, positioning.  Pt appears to adequately tolerate po trials (some foods modified for ease of mastication and conservation of energy) w/ no gross oropharyngeal phase dysphagia, however, she is significantly impacted by her declined Pulmonary status baseline resulting in pt using more open-mouth breathing in general and min increased WOB/SOB at baseline/with any exertion. ANY increased WOB/SOB can directly impact the timing of the pharyngeal swallow and airway closure thus increase risk for aspiration. Pt also required full assistance w/ feeding as she was  unable to feed self. Positioning to be fully upright while in the bed for oral intake is challenged d/t pt's size. Recommend continuing w/ current diet of a Dysphagia level 3 w/ MINCED meats/gravy added w/ thin liquids for Conservation of Energy; general aspiration precautions; Pills Whole in Puree for safer swallowing at this time. Full feeding assistance/support - pt would benefit getting out of bed into a chair for meals. Please reconsult ST services if any decline in pt's swallowing status is noted during admission. NSG updated while in room.     HPI HPI: Pt is a 63 y.o. 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 On Saturday, she was unable to be dialyzed due to clotted AV fistula.  Patient was scheduled to have the AV fistula declotted today at 11 but was unable to make it to the appointment due to worsening respiratory status.  A Code Blue was called in the ED; pt orally intubated then extubated on 05/05/2019.  She is verbally responsive, follows commands. Noted open mouth breathing at baseline; quick to have SOB w/ any exertion including talking, moving about in bed.       SLP Plan  All goals met       Recommendations  Diet recommendations: Thin liquid;Dysphagia 3 (mechanical soft)(w/minced meats) Liquids provided via: Cup;Straw(monitor) Medication Administration: Whole meds with puree(for safer swallowing) Supervision: Staff to assist with self feeding;Full supervision/cueing for compensatory strategies Compensations: Minimize environmental distractions;Slow rate;Small sips/bites;Lingual sweep for clearance of pocketing;Multiple dry swallows  after each bite/sip;Follow solids with liquid Postural Changes  and/or Swallow Maneuvers: Seated upright 90 degrees;Upright 30-60 min after meal(REst Breaks to lessen WOB/SOB)                General recommendations: (Dietician f/u) Oral Care Recommendations: Oral care BID;Patient independent with oral care;Staff/trained caregiver to provide oral care Follow up Recommendations: None SLP Visit Diagnosis: Dysphagia, unspecified (R13.10) Plan: All goals met       GO                 Orinda Kenner, MS, CCC-SLP , 05/07/2019, 3:15 PM

## 2019-05-07 NOTE — Progress Notes (Signed)
Spoke with patient's husband and daughter x3 this shift to update on patient's condition and plan of care.

## 2019-05-08 ENCOUNTER — Encounter: Admission: EM | Disposition: E | Payer: Self-pay | Source: Home / Self Care | Attending: Internal Medicine

## 2019-05-08 ENCOUNTER — Inpatient Hospital Stay: Payer: Medicare Other

## 2019-05-08 DIAGNOSIS — I5031 Acute diastolic (congestive) heart failure: Secondary | ICD-10-CM

## 2019-05-08 DIAGNOSIS — Z992 Dependence on renal dialysis: Secondary | ICD-10-CM

## 2019-05-08 DIAGNOSIS — N186 End stage renal disease: Secondary | ICD-10-CM

## 2019-05-08 DIAGNOSIS — J9600 Acute respiratory failure, unspecified whether with hypoxia or hypercapnia: Secondary | ICD-10-CM

## 2019-05-08 HISTORY — PX: TEMPORARY DIALYSIS CATHETER: CATH118312

## 2019-05-08 LAB — BLOOD GAS, ARTERIAL
Acid-Base Excess: 0.6 mmol/L (ref 0.0–2.0)
Bicarbonate: 27.4 mmol/L (ref 20.0–28.0)
FIO2: 60
MECHVT: 500 mL
O2 Saturation: 73.4 %
PEEP: 5 cmH2O
Patient temperature: 37
RATE: 16 resp/min
pCO2 arterial: 52 mmHg — ABNORMAL HIGH (ref 32.0–48.0)
pH, Arterial: 7.33 — ABNORMAL LOW (ref 7.350–7.450)
pO2, Arterial: 42 mmHg — ABNORMAL LOW (ref 83.0–108.0)

## 2019-05-08 LAB — RENAL FUNCTION PANEL
Albumin: 3 g/dL — ABNORMAL LOW (ref 3.5–5.0)
Anion gap: 17 — ABNORMAL HIGH (ref 5–15)
BUN: 51 mg/dL — ABNORMAL HIGH (ref 8–23)
CO2: 23 mmol/L (ref 22–32)
Calcium: 8.5 mg/dL — ABNORMAL LOW (ref 8.9–10.3)
Chloride: 99 mmol/L (ref 98–111)
Creatinine, Ser: 5.92 mg/dL — ABNORMAL HIGH (ref 0.44–1.00)
GFR calc Af Amer: 8 mL/min — ABNORMAL LOW (ref >60)
GFR calc non Af Amer: 7 mL/min — ABNORMAL LOW (ref >60)
Glucose, Bld: 142 mg/dL — ABNORMAL HIGH (ref 70–99)
Phosphorus: 3.5 mg/dL (ref 2.5–4.6)
Potassium: 3.7 mmol/L (ref 3.5–5.1)
Sodium: 139 mmol/L (ref 135–145)

## 2019-05-08 LAB — GLUCOSE, CAPILLARY
Glucose-Capillary: 110 mg/dL — ABNORMAL HIGH (ref 70–99)
Glucose-Capillary: 111 mg/dL — ABNORMAL HIGH (ref 70–99)
Glucose-Capillary: 114 mg/dL — ABNORMAL HIGH (ref 70–99)
Glucose-Capillary: 124 mg/dL — ABNORMAL HIGH (ref 70–99)
Glucose-Capillary: 126 mg/dL — ABNORMAL HIGH (ref 70–99)
Glucose-Capillary: 140 mg/dL — ABNORMAL HIGH (ref 70–99)
Glucose-Capillary: 88 mg/dL (ref 70–99)
Glucose-Capillary: 93 mg/dL (ref 70–99)

## 2019-05-08 LAB — TRIGLYCERIDES: Triglycerides: 124 mg/dL (ref <150)

## 2019-05-08 SURGERY — DIALYSIS/PERMA CATHETER INSERTION
Anesthesia: Choice

## 2019-05-08 SURGERY — TEMPORARY DIALYSIS CATHETER
Anesthesia: Choice

## 2019-05-08 MED ORDER — ATORVASTATIN CALCIUM 20 MG PO TABS
40.0000 mg | ORAL_TABLET | Freq: Every day | ORAL | Status: DC
  Administered 2019-05-10 – 2019-05-20 (×7): 40 mg
  Filled 2019-05-08 (×7): qty 2

## 2019-05-08 MED ORDER — FENTANYL CITRATE (PF) 100 MCG/2ML IJ SOLN
100.0000 ug | Freq: Once | INTRAMUSCULAR | Status: AC
  Administered 2019-05-08: 100 ug via INTRAVENOUS
  Filled 2019-05-08: qty 2

## 2019-05-08 MED ORDER — AMITRIPTYLINE HCL 25 MG PO TABS
25.0000 mg | ORAL_TABLET | Freq: Every day | ORAL | Status: DC
  Administered 2019-05-10 – 2019-05-20 (×7): 25 mg
  Filled 2019-05-08 (×13): qty 1

## 2019-05-08 MED ORDER — ONDANSETRON HCL 4 MG/2ML IJ SOLN: 4.0000 mg | Freq: Four times a day (QID) | INTRAMUSCULAR | Status: DC | PRN

## 2019-05-08 MED ORDER — MIDAZOLAM HCL 2 MG/2ML IJ SOLN
2.0000 mg | INTRAMUSCULAR | Status: DC | PRN
  Administered 2019-05-08 – 2019-05-20 (×13): 2 mg via INTRAVENOUS
  Filled 2019-05-08 (×16): qty 2

## 2019-05-08 MED ORDER — ALLOPURINOL 300 MG PO TABS
150.0000 mg | ORAL_TABLET | ORAL | Status: DC
  Administered 2019-05-09 – 2019-05-19 (×4): 150 mg
  Filled 2019-05-08 (×3): qty 1

## 2019-05-08 MED ORDER — ORAL CARE MOUTH RINSE
15.0000 mL | OROMUCOSAL | Status: DC
  Administered 2019-05-08 – 2019-05-20 (×113): 15 mL via OROMUCOSAL

## 2019-05-08 MED ORDER — FENTANYL CITRATE (PF) 100 MCG/2ML IJ SOLN
25.0000 ug | Freq: Once | INTRAMUSCULAR | Status: DC
  Filled 2019-05-08: qty 2

## 2019-05-08 MED ORDER — FAMOTIDINE 20 MG PO TABS: 40.0000 mg | ORAL_TABLET | Freq: Once | ORAL | Status: DC | PRN

## 2019-05-08 MED ORDER — NOREPINEPHRINE 16 MG/250ML-% IV SOLN
0.0000 ug/min | INTRAVENOUS | Status: DC
  Administered 2019-05-09: 22 ug/min via INTRAVENOUS
  Administered 2019-05-09: 14 ug/min via INTRAVENOUS
  Administered 2019-05-10: 30 ug/min via INTRAVENOUS
  Administered 2019-05-10 – 2019-05-14 (×3): 10 ug/min via INTRAVENOUS
  Filled 2019-05-08 (×6): qty 250

## 2019-05-08 MED ORDER — SODIUM CHLORIDE 0.9 % IV SOLN
250.0000 mL | INTRAVENOUS | Status: DC
  Administered 2019-05-10: 200 mL via INTRAVENOUS

## 2019-05-08 MED ORDER — SODIUM CHLORIDE 0.9 % IV SOLN: INTRAVENOUS | Status: DC

## 2019-05-08 MED ORDER — MIDODRINE HCL 5 MG PO TABS
10.0000 mg | ORAL_TABLET | Freq: Three times a day (TID) | ORAL | Status: DC
  Administered 2019-05-09 – 2019-05-14 (×15): 10 mg
  Filled 2019-05-08 (×31): qty 2

## 2019-05-08 MED ORDER — ETOMIDATE 2 MG/ML IV SOLN
20.0000 mg | Freq: Once | INTRAVENOUS | Status: AC
  Administered 2019-05-08: 10:00:00 20 mg via INTRAVENOUS
  Filled 2019-05-08: qty 10

## 2019-05-08 MED ORDER — SODIUM CHLORIDE 0.9 % IV SOLN
INTRAVENOUS | Status: DC | PRN
  Administered 2019-05-08: 500 mL via INTRAVENOUS

## 2019-05-08 MED ORDER — CEFAZOLIN SODIUM-DEXTROSE 1-4 GM/50ML-% IV SOLN
1.0000 g | Freq: Once | INTRAVENOUS | Status: AC
  Administered 2019-05-08: 1 g via INTRAVENOUS
  Filled 2019-05-08 (×2): qty 50

## 2019-05-08 MED ORDER — HYDROMORPHONE HCL 1 MG/ML IJ SOLN: 1.0000 mg | Freq: Once | INTRAMUSCULAR | Status: DC | PRN

## 2019-05-08 MED ORDER — CLOPIDOGREL BISULFATE 75 MG PO TABS
75.0000 mg | ORAL_TABLET | Freq: Every day | ORAL | Status: DC
  Administered 2019-05-09 – 2019-05-11 (×3): 75 mg
  Filled 2019-05-08 (×3): qty 1

## 2019-05-08 MED ORDER — ADULT MULTIVITAMIN LIQUID CH
15.0000 mL | Freq: Every day | ORAL | Status: DC
  Administered 2019-05-08 – 2019-05-20 (×8): 15 mL
  Filled 2019-05-08 (×13): qty 15

## 2019-05-08 MED ORDER — CITALOPRAM HYDROBROMIDE 20 MG PO TABS
20.0000 mg | ORAL_TABLET | Freq: Every day | ORAL | Status: DC
  Administered 2019-05-09 – 2019-05-20 (×7): 20 mg
  Filled 2019-05-08 (×8): qty 1

## 2019-05-08 MED ORDER — SENNOSIDES-DOCUSATE SODIUM 8.6-50 MG PO TABS
1.0000 | ORAL_TABLET | Freq: Two times a day (BID) | ORAL | Status: DC
  Administered 2019-05-09 – 2019-05-18 (×12): 1
  Filled 2019-05-08 (×12): qty 1

## 2019-05-08 MED ORDER — FENTANYL BOLUS VIA INFUSION
25.0000 ug | INTRAVENOUS | Status: DC | PRN
  Administered 2019-05-15 – 2019-05-19 (×2): 25 ug via INTRAVENOUS
  Filled 2019-05-08: qty 25

## 2019-05-08 MED ORDER — FENTANYL 2500MCG IN NS 250ML (10MCG/ML) PREMIX INFUSION
25.0000 ug/h | INTRAVENOUS | Status: DC
  Administered 2019-05-08: 375 ug/h via INTRAVENOUS
  Administered 2019-05-08: 100 ug/h via INTRAVENOUS
  Administered 2019-05-09: 200 ug/h via INTRAVENOUS
  Administered 2019-05-10 – 2019-05-11 (×2): 100 ug/h via INTRAVENOUS
  Administered 2019-05-11: 200 ug/h via INTRAVENOUS
  Administered 2019-05-11: 100 ug/h via INTRAVENOUS
  Administered 2019-05-12 – 2019-05-13 (×4): 200 ug/h via INTRAVENOUS
  Administered 2019-05-14: 250 ug/h via INTRAVENOUS
  Administered 2019-05-14: 200 ug/h via INTRAVENOUS
  Administered 2019-05-15: 04:00:00 300 ug/h via INTRAVENOUS
  Administered 2019-05-15 (×2): 350 ug/h via INTRAVENOUS
  Administered 2019-05-16: 10:00:00 200 ug/h via INTRAVENOUS
  Administered 2019-05-16: 02:00:00 350 ug/h via INTRAVENOUS
  Administered 2019-05-17 – 2019-05-19 (×4): 250 ug/h via INTRAVENOUS
  Administered 2019-05-19 (×2): 275 ug/h via INTRAVENOUS
  Administered 2019-05-20: 300 ug/h via INTRAVENOUS
  Filled 2019-05-08 (×27): qty 250

## 2019-05-08 MED ORDER — ROCURONIUM BROMIDE 50 MG/5ML IV SOLN
50.0000 mg | Freq: Once | INTRAVENOUS | Status: AC
  Administered 2019-05-08: 50 mg via INTRAVENOUS
  Filled 2019-05-08: qty 1

## 2019-05-08 MED ORDER — NOREPINEPHRINE BITARTRATE 1 MG/ML IV SOLN
0.0000 ug/min | INTRAVENOUS | Status: DC
  Administered 2019-05-08 – 2019-05-09 (×3): 12 ug/min via INTRAVENOUS
  Filled 2019-05-08 (×3): qty 4

## 2019-05-08 MED ORDER — NOREPINEPHRINE 4 MG/250ML-% IV SOLN
2.0000 ug/min | INTRAVENOUS | Status: DC
  Administered 2019-05-08: 2 ug/min via INTRAVENOUS
  Filled 2019-05-08: qty 250

## 2019-05-08 MED ORDER — FAMOTIDINE 20 MG PO TABS
20.0000 mg | ORAL_TABLET | Freq: Every day | ORAL | Status: DC
  Administered 2019-05-09 – 2019-05-10 (×2): 20 mg
  Filled 2019-05-08 (×2): qty 1

## 2019-05-08 MED ORDER — PROPOFOL 1000 MG/100ML IV EMUL
5.0000 ug/kg/min | INTRAVENOUS | Status: DC
  Administered 2019-05-08: 10 ug/kg/min via INTRAVENOUS
  Administered 2019-05-09: 30 ug/kg/min via INTRAVENOUS
  Administered 2019-05-09: 20 ug/kg/min via INTRAVENOUS
  Administered 2019-05-09: 25 ug/kg/min via INTRAVENOUS
  Administered 2019-05-09: 30 ug/kg/min via INTRAVENOUS
  Administered 2019-05-10: 12:00:00 10 ug/kg/min via INTRAVENOUS
  Filled 2019-05-08 (×7): qty 100

## 2019-05-08 MED ORDER — METHYLPREDNISOLONE SODIUM SUCC 125 MG IJ SOLR: 125.0000 mg | Freq: Once | INTRAMUSCULAR | Status: DC | PRN

## 2019-05-08 MED ORDER — MIDAZOLAM HCL 2 MG/ML PO SYRP
8.0000 mg | ORAL_SOLUTION | Freq: Once | ORAL | Status: DC | PRN
  Filled 2019-05-08: qty 4

## 2019-05-08 MED ORDER — DIPHENHYDRAMINE HCL 50 MG/ML IJ SOLN: 50.0000 mg | Freq: Once | INTRAMUSCULAR | Status: DC | PRN

## 2019-05-08 SURGICAL SUPPLY — 11 items
DRAPE INCISE IOBAN 66X45 STRL (DRAPES) ×2 IMPLANT
GUIDEWIRE AMPLATZ SHORT (WIRE) ×1 IMPLANT
KIT DIALYSIS CATH TRI 30X13 (CATHETERS) ×1 IMPLANT
NDL ENTRY 21GA 7CM ECHOTIP (NEEDLE) IMPLANT
NEEDLE ENTRY 21GA 7CM ECHOTIP (NEEDLE) ×2 IMPLANT
PACK ANGIOGRAPHY (CUSTOM PROCEDURE TRAY) ×2 IMPLANT
SET INTRO CAPELLA COAXIAL (SET/KITS/TRAYS/PACK) ×1 IMPLANT
SUT MNCRL AB 4-0 PS2 18 (SUTURE) ×2 IMPLANT
SUT PROLENE 0 CT 1 30 (SUTURE) ×2 IMPLANT
SUT VIC AB 3-0 SH 27 (SUTURE) ×1
SUT VIC AB 3-0 SH 27X BRD (SUTURE) IMPLANT

## 2019-05-08 NOTE — Progress Notes (Signed)
Verified with Kaleen Mask, PA, of removal of temp cath this afternoon, prior to perm cath placement on Friday.  Per Maudie Mercury, go ahead and remove today.

## 2019-05-08 NOTE — TOC Progression Note (Signed)
Transition of Care [facility]) - Progression Note    Patient Details  Name: [name] MRN: [id number] Date of Birth: [birth year]  [facility]) CM/SW Contact  Shelbie Hutching, RN Phone Number: 05/31/2019, 12:08 PM  Clinical Narrative:     Patient was scheduled for [facility] cath placement for HD today but went into respiratory distress.  Patient was brought up to the ICU on bipap and intubated by critical care MD.  Patient currently intubated and sedated.    Expected Discharge Plan: (unsure of discharge needs at this time) Barriers to Discharge: Continued Medical Work up  Expected Discharge Plan and Services Expected Discharge Plan: (unsure of discharge needs at this time)   Discharge Planning Services: CM Consult   Living arrangements for the past 2 months: Single Family Home                                       Social Determinants of Health (SDOH) Interventions    Readmission Risk Interventions No flowsheet data found.

## 2019-05-08 NOTE — OR Nursing (Signed)
Pt sat 85% on 6 liters . Breath sounds diminished. Pt doesn't tolerate lying flat. Placed on NRB @15  liters oxygen. Pt reporting sob. Iv restarted. Temp 98.3 ax. Left groin former temp cath site with pus drainage on dressing. [name] charge for vascular lab to notify Dr Delana Meyer of pt status.

## 2019-05-08 NOTE — Progress Notes (Signed)
PT Cancellation Note  Patient Details Name: [name] MRN: [id number] DOB: [birth year]   Cancelled Treatment:    Reason Eval/Treat Not Completed: Medical issues which prohibited therapy.  Chart reviewed.  Pt transferred to CCU this morning.  D/t pt transferring to higher level of care, per PT protocol require new PT consult in order to continue therapy (will discontinue current PT order d/t this).  Please re-consult PT when pt is medically appropriate to participate in PT.   Leitha Bleak, PT 05/07/2019, 10:12 AM [phone]

## 2019-05-08 NOTE — Progress Notes (Signed)
Multiple attempts were made to place LEFT IJ Trialysis catheter Unable to thread guidewire.  The Let Femeral Vein was also difficult to place trialysis due to unable to visualize vein and due to extensive pannus.

## 2019-05-08 NOTE — Progress Notes (Signed)
[place] [facility] [place] NAME: [name]    MR#:  [id number]  DATE OF BIRTH:  Feb 28, 1956    CHIEF COMPLAINT:   Chief Complaint  Patient presents with  . Shortness of Breath  Patient was in special procedures when she developed shortness of breath and had to be intubated.   REVIEW OF SYSTEMS:    CONSTITUTIONAL: Intubated unable to provide        DRUG ALLERGIES:   Allergies  Allergen Reactions  . Iodine Rash  . Enalapril Other (See Comments)    TONGUE SWELLS/GOUT  . Iodinated Diagnostic Agents Other (See Comments)    BREAK OUT IN WHELPS      VITALS:  Blood pressure (!) 90/49, pulse 85, temperature 98.3 F (36.8 C), temperature source Axillary, resp. rate 16, height 5' 5.98" (1.676 m), weight (!) 144.8 kg, SpO2 90 %.  PHYSICAL EXAMINATION:  GENERAL:  63 y.o.-morbidly obese critically ill-appearing EYES: Pupils equal, round, reactive to light  No scleral icterus.  HEENT: Head atraumatic, normocephalic.  Currently intubated, sedated.  Clear.  NECK:  Supple, no jugular venous distention. No thyroid enlargement, no tenderness.  LUNGS: Diminished breath sounds bilaterally. CARDIOVASCULAR: S1, S2 normal. No murmurs, rubs, or gallops.  ABDOMEN: Soft, nontender, nondistended. Bowel sounds present. No organomegaly or mass.  EXTREMITIES: Positive pedal edema, cyanosis, or clubbing.  NEUROLOGIC: Unable to do full neuro exam because of intubation, sedation. PSYCHIATRIC: Intubated, sedated.    SKIN: No obvious rash, lesion, or ulcer.    LABORATORY PANEL:   CBC Recent Labs  Lab 05/06/19 0437  WBC 10.9*  HGB 8.7*  HCT 27.6*  PLT 259   ------------------------------------------------------------------------------------------------------------------  Chemistries  Recent Labs  Lab 05/03/19 0439 05/04/19 0436  05/04/2019 0445  NA 137 136   < > 139  K 3.6 3.7   < > 3.7  CL 101 102   < > 99  CO2 27 25   < > 23   GLUCOSE 206* 222*   < > 142*  BUN 46* 75*   < > 51*  CREATININE 3.40* 4.90*   < > 5.92*  CALCIUM 7.9* 7.9*   < > 8.5*  MG 1.9 2.0  --   --   AST 11*  --   --   --   ALT 14  --   --   --   ALKPHOS 103  --   --   --   BILITOT 0.5  --   --   --    < > = values in this interval not displayed.   ------------------------------------------------------------------------------------------------------------------  Cardiac Enzymes Recent Labs  Lab 05/06/19 0437  TROPONINI 0.03*   ------------------------------------------------------------------------------------------------------------------  RADIOLOGY:  Dg Chest Port 1 View  Result Date: 05/07/2019 CLINICAL DATA:  Shortness of breath. EXAM: PORTABLE CHEST 1 VIEW COMPARISON:  Radiographs of May 06, 2019. FINDINGS: Stable cardiomediastinal silhouette. Left internal jugular catheter is unchanged. No pneumothorax or significant pleural effusion is noted. Minimal bibasilar subsegmental atelectasis is noted. Bony thorax is unremarkable. IMPRESSION: Minimal bibasilar subsegmental atelectasis. Electronically Signed   By: Marijo Conception M.D.   On: 05/07/2019 09:55    EKG:   Orders placed or performed during the hospital encounter of 04/15/2019  . EKG 12-Lead  . EKG 12-Lead  . EKG 12-Lead  . EKG 12-Lead    ASSESSMENT AND PLAN:   63 year old female patient with history of CVA, obstructive sleep apnea on CPAP, ESRD on hemodialysis Tuesday, Thursday,  Saturday, congestive heart failure, COPD comes in because of worsening shortness of breath, she was called in the emergency room, had PEA, intubated, admitted to intensive care unit.  1 .acute respiratory failure status post PEA,  Patient reintubated   2.  Septic shock secondary to HCAP,  status post antibiotics  3.  ESRD, on hemodialysis As per nephrology  4.  Diabetes type 2 continue sliding-scale insulin and Lantus blood glucose are stable  5.  Morbid obesity weight loss recommended  6.   Hyperlipidemia continue Lipitor  7.  Previous history of stroke continue Plavix  8.  Hypotension currently on midodrine  9.  COPD continue nebs  10.  Anemia due to anemia of chronic disease   Poor prognosis patient now reintubated  All the records are reviewed and case discussed with Care Management/Social Workerr. Management plans discussed with the patient, family and they are in agreement.  CODE STATUS: full  TOTAL TIME TAKING CARE OF THIS PATIENT: 35 minutes.     Dustin Flock M.D on 05/29/2019 at 3:40 PM  Between 7am to 6pm - Pager - [phone]  After 6pm go to [email or web address] - password [email or web address] [facility]  Office  [phone]  CC: Primary care physician; Smithson, [name], MD   Note: This dictation was prepared with Dragon dictation along with smaller phrase technology. Any transcriptional errors that result from this process are unintentional.

## 2019-05-08 NOTE — Progress Notes (Signed)
CRITICAL CARE NOTE  CC  Acute and severe resp failure  SUBJECTIVE critically ill Prognosis is guarded Emergently intubated On full vent support Needs trialysis catheter for support  Vent Mode: PRVC FiO2 (%):  [60 %] 60 % Set Rate:  [16 bmp] 16 bmp Vt Set:  [500 mL] 500 mL PEEP:  [5 cmH20] 5 cmH20   BP (!) 90/49   Pulse 85   Temp 98.3 F (36.8 C) (Axillary)   Resp 16   Ht 5' 5.98" (1.676 m)   Wt (!) 144.8 kg   SpO2 90%   BMI 51.55 kg/m    I/O last 3 completed shifts: In: 170.2 [P.O.:120; I.V.:0.3; IV Piggyback:49.9] Out: 0  Total I/O In: 83.7 [I.V.:83.7] Out: -   SpO2: 90 % O2 Flow Rate (L/min): 15 L/min FiO2 (%): 60 %   SIGNIFICANT EVENTS 5/26 ICU admission for resp failure and cardiac arrest 5/26 hypothermia protocol 6/3 successfully extubated 6/5 re-admitted to the ICU for aspiration pneumonia and progressive diastolic heart failure and renal failure  REVIEW OF SYSTEMS  PATIENT IS UNABLE TO PROVIDE COMPLETE REVIEW OF SYSTEMS DUE TO SEVERE CRITICAL ILLNESS   PHYSICAL EXAMINATION:  GENERAL:critically ill appearing, +resp distress HEAD: Normocephalic, atraumatic.  EYES: Pupils equal, round, reactive to light.  No scleral icterus.  MOUTH: Moist mucosal membrane. NECK: Supple. No thyromegaly. No nodules. No JVD.  PULMONARY: +rhonchi, +wheezing CARDIOVASCULAR: S1 and S2. Regular rate and rhythm. No murmurs, rubs, or gallops.  GASTROINTESTINAL: Soft, nontender, -distended. No masses. Positive bowel sounds. No hepatosplenomegaly.  MUSCULOSKELETAL: No swelling, clubbing, or edema.  NEUROLOGIC: obtunded, GCS<8 SKIN:intact,warm,dry  MEDICATIONS: I have reviewed all medications and confirmed regimen as documented   CULTURE RESULTS   Recent Results (from the past 240 hour(s))  Culture, respiratory (non-expectorated)     Status: None   Collection Time: 05/01/19 11:05 AM  Result Value Ref Range Status   Specimen Description   Final    TRACHEAL  ASPIRATE Performed at [facility], [address], [place], [place] [address]    Special Requests   Final    NONE Performed at [facility], [place] [place], [place] [address]    Gram Stain   Final    MODERATE WBC PRESENT, PREDOMINANTLY PMN RARE YEAST Performed at [facility], [place] [address], [place], [place] [address]    Culture FEW CANDIDA ALBICANS FEW CANDIDA TROPICALIS   Final   Report Status 05/04/2019 FINAL  Final      CBC    Component Value Date/Time   WBC 10.9 (H) 05/06/2019 0437   RBC 2.96 (L) 05/06/2019 0437   HGB 8.7 (L) 05/06/2019 0437   HGB 10.5 (L) 03/28/2015 0137   HCT 27.6 (L) 05/06/2019 0437   HCT 31.9 (L) 03/28/2015 0137   PLT 259 05/06/2019 0437   PLT 302 03/28/2015 0137   MCV 93.2 05/06/2019 0437   MCV 90 03/28/2015 0137   MCH 29.4 05/06/2019 0437   MCHC 31.5 05/06/2019 0437   RDW 17.9 (H) 05/06/2019 0437   RDW 14.9 (H) 03/28/2015 0137   LYMPHSABS 1.7 05/05/2019 0458   LYMPHSABS 1.7 03/24/2015 0531   MONOABS 0.9 05/05/2019 0458   MONOABS 1.2 (H) 03/24/2015 0531   EOSABS 0.2 05/05/2019 0458   EOSABS 0.0 03/24/2015 0531   BASOSABS 0.0 05/05/2019 0458   BASOSABS 0.2 (H) 03/24/2015 0531   BMP Latest Ref Rng & Units 05/26/2019 05/06/2019 05/05/2019  Glucose 70 - 99 mg/dL 142(H) 139(H) 177(H)  BUN  8 - 23 mg/dL 51(H) 68(H) 98(H)  Creatinine 0.44 - 1.00 mg/dL 5.92(H) 5.41(H) 6.17(H)  Sodium 135 - 145 mmol/L 139 136 136  Potassium 3.5 - 5.1 mmol/L 3.7 3.8 3.8  Chloride 98 - 111 mmol/L 99 100 101  CO2 22 - 32 mmol/L 23 22 25   Calcium 8.9 - 10.3 mg/dL 8.5(L) 7.9(L) 7.8(L)        Indwelling Urinary Catheter continued, requirement due to   Reason to continue Indwelling Urinary Catheter strict Intake/Output monitoring for hemodynamic instability         Ventilator continued, requirement due to severe respiratory failure   Ventilator Sedation RASS 0 to -2      ASSESSMENT AND PLAN SYNOPSIS  63 yo morbidly  obese AAF with severe end stage renal disease s/p cardiac arrest and hypothermia protocol with recurrent resp failure from acute diastolic heart failure and aspiration pneumonia  Due to her underlying end stage diastolic;ic heart failure and end stage renal disease with recurrent resp failure and significant morbid obesity, she well need Trach for survival. Sister and Husband notified of clinical status.  Severe ACUTE Hypoxic and Hypercapnic Respiratory Failure -continue Full MV support -continue Bronchodilator Therapy -Wean Fio2 and PEEP as tolerated -she needs vent for survival, unable to wean at this time -ENT consulted for trach  ACUTE DIASTOLIC CARDIAC FAILURE-  -oxygen as needed -Lasix as tolerated -follow up cardiac enzymes as indicated    Chronic KIDNEY INJURY/Renal Failure -follow chem 7 -follow UO -continue Foley Catheter-assess need -Avoid nephrotoxic agents -Recheck creatinine  Follow up Nephrology recs   NEUROLOGY - intubated and sedated - minimal sedation to achieve a RASS goal: -1   CARDIAC ICU monitoring   GI GI PROPHYLAXIS as indicated  NUTRITIONAL STATUS DIET-->TF's as tolerated Constipation protocol as indicated  ENDO - will use ICU hypoglycemic\Hyperglycemia protocol if indicated   ELECTROLYTES -follow labs as needed -replace as needed -pharmacy consultation and following   DVT/GI PRX ordered TRANSFUSIONS AS NEEDED MONITOR FSBS ASSESS the need for LABS as needed   Critical Care Time devoted to patient care services described in this note is 43 minutes.   Overall, patient is critically ill, prognosis is guarded.  Patient with Multiorgan failure and at high risk for cardiac arrest and death.   Prognosis is very poor.  Corrin Parker, M.D.  [facility]  Medical Director [place] Director [facility] Cardio-Pulmonary Department

## 2019-05-08 NOTE — Progress Notes (Signed)
Pre HD Assessment    05/06/2019 1645  Neurological  Level of Consciousness Responds to Pain  Orientation Level Intubated/Tracheostomy - Unable to assess  Respiratory  Respiratory Pattern Regular  Chest Assessment Chest expansion symmetrical  Bilateral Breath Sounds Diminished;Rhonchi  Cardiac  Pulse Regular  Heart Sounds S1, S2  Jugular Venous Distention (JVD) No  Cardiac Rhythm NSR  Antiarrhythmic device No  Vascular  R Radial Pulse +2  L Radial Pulse +2  R Dorsalis Pedis Pulse +1  L Dorsalis Pedis Pulse +1  Edema Generalized  Generalized Edema +3  Integumentary  Integumentary (WDL) X  Skin Color Appropriate for ethnicity  Skin Condition Dry  Skin Integrity MASD;Skin tear;Ecchymosis  Ecchymosis Location Flank  Ecchymosis Location Orientation Left  Musculoskeletal  Musculoskeletal (WDL) X  Generalized Weakness Yes  Gastrointestinal  Bowel Sounds Assessment Hypoactive  GU Assessment  Genitourinary (WDL) X  Genitourinary Symptoms Anuria  Psychosocial  Psychosocial (WDL) WDL

## 2019-05-08 NOTE — Progress Notes (Signed)
PT Cancellation Note  Patient Details Name: [name] MRN: [id number] DOB: Jun 14, 1956   Cancelled Treatment:    Reason Eval/Treat Not Completed: Patient at procedure or test/unavailable.  Pt currently off unit at procedure (per notes plan for Permcath insertion today).  Will re-attempt PT evaluation at a later date/time.  Leitha Bleak, PT 05/25/2019, 8:40 AM [phone]

## 2019-05-08 NOTE — Progress Notes (Signed)
HD Tx Start   HD started via newly placed right groin trialysis catheter.  Dressing changed (from gauze to biopatch).  Attempting 1.5-2 liter fluid pull per MD.     05/27/2019 1700  Vital Signs  Pulse Rate 91  Resp 14  BP 127/66  Oxygen Therapy  SpO2 100 %  O2 Device Ventilator  During Hemodialysis Assessment  Blood Flow Rate (mL/min) 400 mL/min  Arterial Pressure (mmHg) 600 mmHg  Venous Pressure (mmHg) -160 mmHg  Transmembrane Pressure (mmHg) 210 mmHg  Ultrafiltration Rate (mL/min) 667 mL/min  Dialysate Flow Rate (mL/min) 600 ml/min  Conductivity: Machine  14  HD Safety Checks Performed Yes  Dialysis Fluid Bolus Normal Saline  Bolus Amount (mL) 250 mL  Intra-Hemodialysis Comments Tx initiated  Hemodialysis Catheter Right Femoral vein Double lumen Permanent  Placement Date/Time: 06/01/2019 1700   Placed prior to admission: No  Time Out: Correct patient;Correct site;Correct procedure  Maximum sterile barrier precautions: Hand hygiene;Sterile gloves;Cap;Large sterile sheet;Mask;Sterile gown  Site Prep: Chlorh...  Site Condition No complications  Blue Lumen Status Infusing  Red Lumen Status Infusing

## 2019-05-08 NOTE — Op Note (Signed)
  OPERATIVE NOTE   PROCEDURE: 1. Insertion of temporary dialysis catheter catheter right femoral approach.  PRE-OPERATIVE DIAGNOSIS: Chronic end-stage renal disease on hemodialysis; acute respiratory failure requiring emergent intubation  POST-OPERATIVE DIAGNOSIS: Same  SURGEON: Katha Cabal M.D.  ANESTHESIA: 1% lidocaine local infiltration  ESTIMATED BLOOD LOSS: Minimal cc  INDICATIONS:   [name] is a 63 y.o. female who presents with end-stage renal disease.  She is required emergent reintubation and is been returned to the intensive care unit.  Her last dialysis run was Thursday which went without difficulty and her temporary catheter was removed in preparation for a permacath.  Today she has decompensated and has been returned to the intensive care unit.  She required emergent intubation.  We will therefore place a temporary dialysis catheter so that we can maintain her life sustaining dialysis treatments.    DESCRIPTION: After obtaining full informed written consent, the patient was positioned supine. The right groin was prepped and draped in a sterile fashion. Ultrasound was placed in a sterile sleeve. Ultrasound was utilized to identify the right femoral vein which is noted to be echolucent and compressible indicating patency. Images recorded for the permanent record. Under real-time visualization a Seldinger needle is inserted into the vein and the guidewires advanced without difficulty. Small counterincision was made at the wire insertion site. Dilator is passed over the wire and the temporary dialysis catheter catheter is fed over the wire without difficulty.  All lumens aspirate and flush easily and are packed with heparin saline. Catheter secured to the skin of the right thigh with 2-0 silk. A sterile dressing is applied with Biopatch.  COMPLICATIONS: None  CONDITION: Unchanged  [name] Office:  [phone] 05/18/2019, 4:43 PM

## 2019-05-08 NOTE — Progress Notes (Signed)
Pt with multiple vent alarms with "low expiratory volumes."  RT to bedside.  Pt "breath stacking."  Adjusted propofol rate.

## 2019-05-08 NOTE — OR Nursing (Signed)
Informed Dr Delana Meyer of pt status.perm cath on hold. Dr Mortimer Fries called he ordered pt to be placed on BiPap. Hospitalist paged for orders to transfer to ICU.

## 2019-05-08 NOTE — Progress Notes (Signed)
Pre HD Tx    05/07/2019 1645  Hand-Off documentation  Report given to (Full Name) Trellis Paganini Rn  Report received from (Full Name) Colin Rhein RN  Vital Signs  Temp 98.6 F (37 C)  Temp Source Axillary  Pulse Rate 89  Pulse Rate Source Monitor  Resp 14  BP 130/62  BP Location Right Wrist  BP Method Automatic  Patient Position (if appropriate) Lying  Oxygen Therapy  SpO2 100 %  O2 Device Ventilator  Pain Assessment  Pain Scale CPOT  Critical Care Pain Observation Tool (CPOT)  Facial Expression 0  Body Movements 0  Muscle Tension 0  Compliance with ventilator (intubated pts.) 0  Vocalization (extubated pts.) N/A  CPOT Total 0  Dialysis Weight  Type of Weight Pre-Dialysis  Time-Out for Hemodialysis  What Procedure? Hemodialysis  Pt Identifiers(min of two) First/Last Name;MRN/Account#  Correct Site? Yes  Correct Side? Yes  Correct Procedure? Yes  Consents Verified? Yes  Rad Studies Available? N/A  Safety Precautions Reviewed? Yes  Engineer, civil (consulting) Number 3  Station Number  Engineer, materials)  UF/Alarm Test Passed  Conductivity: Meter 14  Conductivity: Machine  14  pH 7.2  Reverse Osmosis WRO4  Normal Saline Lot Number [id number]  Dialyzer Lot Number [id number]  Disposable Set Lot Number [id number]  Machine Temperature 98.6 F (37 C)  Musician and Audible Yes  Blood Lines Intact and Secured Yes  Pre Treatment Patient Checks  Vascular access used during treatment Catheter  Hepatitis B Surface Antigen Results Negative  Date Hepatitis B Surface Antigen Drawn 05/02/19  Hepatitis B Surface Antibody  (<10)  Date Hepatitis B Surface Antibody Drawn  (05/02/19)  Hemodialysis Consent Verified Yes  Hemodialysis Standing Orders Initiated Yes  ECG (Telemetry) Monitor On Yes  Prime Ordered Normal Saline  Length of  DialysisTreatment -hour(s) 3 Hour(s)  Dialysis Treatment Comments Na 140   Dialyzer Elisio 17H NR  Dialysate 3K, 2.5 Ca  Dialysate Flow Ordered 600   Blood Flow Rate Ordered 400 mL/min  Ultrafiltration Goal 1.5 Liters  Pre Treatment Labs  (triglycerides)  Dialysis Blood Pressure Support Ordered Normal Saline  Education / Care Plan  Dialysis Education Provided No (Comment) (intubated)  Hemodialysis Catheter Right Femoral vein Double lumen Permanent  Placement Date/Time: 05/04/2019 1700   Placed prior to admission: No  Time Out: Correct patient;Correct site;Correct procedure  Maximum sterile barrier precautions: Hand hygiene;Sterile gloves;Cap;Large sterile sheet;Mask;Sterile gown  Site Prep: Chlorh...  Site Condition No complications  Blue Lumen Status Capped (Central line)  Red Lumen Status Capped (Central line)  Dressing Type Gauze/Drain sponge  Dressing Status New drainage;Dressing changed;Antimicrobial disc changed  Interventions New dressing;Dressing changed  Drainage Description None  Dressing Change Due 05/15/19

## 2019-05-08 NOTE — OR Nursing (Signed)
[place] charge nurse ICU notified of pending transfer to ICU, report given. Larry RT placing pt on BiPap.

## 2019-05-08 NOTE — Progress Notes (Signed)
Post HD Tx  1.5 liters removed, hypotensive episode mid-tx. Resolved with UF off and decrease in UF goal. Stable post rinseback.    05/10/2019 2005  Vital Signs  Pulse Rate 95  Resp 17  Oxygen Therapy  SpO2 93 %  End Tidal CO2 (EtCO2) 34  Post-Hemodialysis Assessment  Rinseback Volume (mL) 250 mL  Dialyzer Clearance Heavily streaked  Duration of HD Treatment -hour(s) 3 hour(s)  Hemodialysis Intake (mL) 500 mL  UF Total -Machine (mL) 2000 mL  Net UF (mL) 1500 mL  Tolerated HD Treatment Yes  Hemodialysis Catheter Right Femoral vein Double lumen Permanent  Placement Date/Time: 05/04/2019 1700   Placed prior to admission: No  Time Out: Correct patient;Correct site;Correct procedure  Maximum sterile barrier precautions: Hand hygiene;Sterile gloves;Cap;Large sterile sheet;Mask;Sterile gown  Site Prep: Chlorh...  Site Condition No complications  Blue Lumen Status Flushed;Capped (Central line);Heparin locked  Red Lumen Status Flushed;Capped (Central line);Heparin locked  Catheter fill solution Heparin 1000 units/ml  Catheter fill volume (Arterial) 1.4 cc  Catheter fill volume (Venous) 1.4  Dressing Type Biopatch;Other (Comment);Occlusive (tegaderm)  Dressing Status Clean;Dry;Intact  Interventions Dressing changed  Drainage Description Serosanguineous  Dressing Change Due 05/15/19  Post treatment catheter status Capped and Clamped

## 2019-05-08 NOTE — Procedures (Signed)
Endotracheal Intubation: Patient required placement of an artificial airway secondary to Respiratory Failure  Consent: Emergent.   Hand washing performed prior to starting the procedure.   Medications administered for sedation prior to procedure:  Etomidate 20 mg IV,  ROCuronium 50 mg IV, Fentanyl 100 mcg IV.    A time out procedure was called and correct patient, name, & ID confirmed. Needed supplies and equipment were assembled and checked to include ETT, 10 ml syringe, Glidescope, Mac and Miller blades, suction, oxygen and bag mask valve, end tidal CO2 monitor.   Patient was positioned to align the mouth and pharynx to facilitate visualization of the glottis.   Heart rate, SpO2 and blood pressure was continuously monitored during the procedure. Pre-oxygenation was conducted prior to intubation and endotracheal tube was placed through the vocal cords into the trachea.     The artificial airway was placed under direct visualization via glidescope route using a 8.0 ETT on the first attempt.  ETT was secured at 23 cm mark.  Placement was confirmed by auscuitation of lungs with good breath sounds bilaterally and no stomach sounds.  Condensation was noted on endotracheal tube.   Pulse ox 98%.  CO2 detector in place with appropriate color change.   Complications: None .   Operator: Kasa.   Chest radiograph ordered and pending.   Comments: OGT placed via glidescope.  Corrin Parker, M.D.  [facility]  Medical Director [place] Director [facility]

## 2019-05-08 NOTE — Progress Notes (Signed)
Notified by respiratory therapist Jenny Reichmann attempted to obtain ABG, however pt difficult lab draw.  Therefore, VBG obtained results reviewed not ventilator adjustments necessary.  Marda Stalker, [place] Pager [phone] (please enter 7 digits) PCCM Consult Pager [phone] (please enter 7 digits) a

## 2019-05-08 NOTE — Progress Notes (Addendum)
Post HD Assessment  1.5 liters removed, BP decline mid tx, so UF goal decreased from 2 liters to to 1.5 liters.  Weight down 4 liters from 05/06/19. 05/06/19: 144.8 kg 05/04/2019 (post tx): 140.7 kg  Dressing change on newly placed trialysis cath right groin. Protective barriers to prevent skin breakdown placed.     05/10/2019 2115  Neurological  Level of Consciousness Responds to Pain  Orientation Level Intubated/Tracheostomy - Unable to assess  Respiratory  Chest Assessment Chest expansion symmetrical  Bilateral Breath Sounds Diminished;Rhonchi  Cardiac  Pulse Regular  Heart Sounds S1, S2  Jugular Venous Distention (JVD) No  Cardiac Rhythm NSR  Antiarrhythmic device No  Vascular  R Radial Pulse +2  L Radial Pulse +2  R Dorsalis Pedis Pulse +1  L Dorsalis Pedis Pulse +1  Edema Generalized  Generalized Edema +3  Integumentary  Integumentary (WDL) X  Skin Color Appropriate for ethnicity  Skin Condition Dry  Skin Integrity MASD;Skin tear;Ecchymosis  Ecchymosis Location Flank  Ecchymosis Location Orientation Left  Musculoskeletal  Musculoskeletal (WDL) X  Generalized Weakness Yes  Gastrointestinal  Bowel Sounds Assessment Hypoactive  GU Assessment  Genitourinary (WDL) X  Genitourinary Symptoms Anuria  Psychosocial  Psychosocial (WDL) X  Patient Behaviors Not interactive

## 2019-05-08 NOTE — Progress Notes (Signed)
HD Tx End  1.5 liters removed, hypotensive episode mid-tx. Resolved with UF off and decrease in UF goal. Stable post rinseback.    05/23/2019 2000  Vital Signs  Pulse Rate 96  Resp 16  BP (!) 91/50  Oxygen Therapy  SpO2 92 %  End Tidal CO2 (EtCO2) 32  During Hemodialysis Assessment  Blood Flow Rate (mL/min) 200 mL/min  Arterial Pressure (mmHg) -160 mmHg  Venous Pressure (mmHg) 220 mmHg  Transmembrane Pressure (mmHg) 50 mmHg  Ultrafiltration Rate (mL/min) 667 mL/min  Dialysate Flow Rate (mL/min) 600 ml/min  Conductivity: Machine  14  HD Safety Checks Performed Yes  Intra-Hemodialysis Comments Tx completed

## 2019-05-08 NOTE — Progress Notes (Signed)
[facility]  ROUNDING NOTE   Subjective:   Patient underwent hemodialysis yesterday successfully.  4 L of fluid has been removed so far in 2 sessions.  Tolerated well and was on nasal cannula at the end of dialysis treatment.  However, this morning, became short of breath again and could not get her PermCath placed.  She was shifted to ICU where she had to be intubated. Now back to getting temporary dialysis catheter this afternoon   Objective:  Vital signs in last 24 hours:  Temp:  [98.3 F (36.8 C)-99.3 F (37.4 C)] 98.3 F (36.8 C) (06/05 0842) Pulse Rate:  [85-111] 85 (06/05 1300) Resp:  [16-36] 16 (06/05 1300) BP: (90-179)/(49-70) 90/49 (06/05 1300) SpO2:  [90 %-99 %] 90 % (06/05 1300) FiO2 (%):  [60 %] 60 % (06/05 1201)  Weight change:  Filed Weights   05/05/19 1215 05/06/19 0500 05/06/19 0900  Weight: (!) 145 kg (!) 144.6 kg (!) 144.8 kg    Intake/Output: I/O last 3 completed shifts: In: 170.2 [P.O.:120; I.V.:0.3; IV Piggyback:49.9] Out: 0    Intake/Output this shift:  Total I/O In: 83.7 [I.V.:83.7] Out: -   Physical Exam: General: Chronically ill appearing  HEENT  ET tube, OG tube  Neck: supple  Lungs:   Ventilator assisted, difficult exam due to body habitus  Heart: Regular rate and rhythm, tachycardic  Abdomen:  Soft, nontender, obese  Extremities:  + peripheral edema.  Neurologic:  Alert and oriented  Skin:  warm  Access: +thrombosed left arm AVG,     Basic Metabolic Panel: Recent Labs  Lab 05/01/19 1800 05/01/19 2209 05/02/19 0404 05/02/19 0910 05/03/19 0439 05/04/19 0436 05/05/19 0458 05/06/19 0437 05/30/2019 0445  NA 135 135 135 136 137 136 136 136 139  K 3.4* 3.7 3.8 3.9 3.6 3.7 3.8 3.8 3.7  CL 103 102 102 102 101 102 101 100 99  CO2 26 27 26 26 27 25 25 22 23   GLUCOSE 202* 204* 194* 193* 206* 222* 177* 139* 142*  BUN 36* 39* 49* 55* 46* 75* 98* 68* 51*  CREATININE 2.20* 2.19* 2.61* 3.15* 3.40* 4.90* 6.17* 5.41* 5.92*   CALCIUM 7.6* 8.0* 7.6* 7.8* 7.9* 7.9* 7.8* 7.9* 8.5*  MG 1.7 1.9 1.9  --  1.9 2.0  --   --   --   PHOS 2.4* 2.6 2.8 2.5 2.4*  --   --  4.2 3.5    Liver Function Tests: Recent Labs  Lab 05/02/19 0404 05/02/19 0910 05/03/19 0439 05/06/19 0437 05/06/2019 0445  AST  --   --  11*  --   --   ALT  --   --  14  --   --   ALKPHOS  --   --  103  --   --   BILITOT  --   --  0.5  --   --   PROT  --   --  5.8*  --   --   ALBUMIN 2.3* 2.2* 2.3* 2.8* 3.0*   No results for input(s): LIPASE, AMYLASE in the last 168 hours. No results for input(s): AMMONIA in the last 168 hours.  CBC: Recent Labs  Lab 05/02/19 0404 05/02/19 0910 05/03/19 0439 05/05/19 0458 05/06/19 0437  WBC 9.1 9.9 9.8 11.4* 10.9*  NEUTROABS 6.5  --   --  7.9*  --   HGB 8.7* 8.7* 8.3* 8.5* 8.7*  HCT 27.9* 27.1* 26.8* 26.7* 27.6*  MCV 94.3 92.5 95.0 92.4 93.2  PLT 142*  158 170 243 259    Cardiac Enzymes: Recent Labs  Lab 05/06/19 0437  TROPONINI 0.03*    BNP: Invalid input(s): POCBNP  CBG: Recent Labs  Lab 05/09/2019 0017 05/24/2019 0406 06/02/2019 0745 05/17/2019 0954 05/14/2019 1324  GLUCAP 140* 124* 111* 93 5    Microbiology: Results for orders placed or performed during the hospital encounter of 04/07/2019  SARS Coronavirus 2 (CEPHEID - Performed in [facility] hospital lab), Hosp Order     Status: None   Collection Time: 04/24/2019  7:31 AM  Result Value Ref Range Status   SARS Coronavirus 2 NEGATIVE NEGATIVE Final    Comment: (NOTE) If result is NEGATIVE SARS-CoV-2 target nucleic acids are NOT DETECTED. The SARS-CoV-2 RNA is generally detectable in upper and lower  respiratory specimens during the acute phase of infection. The lowest  concentration of SARS-CoV-2 viral copies this assay can detect is 250  copies / mL. A negative result does not preclude SARS-CoV-2 infection  and should not be used as the sole basis for treatment or other  patient management decisions.  A negative result may occur with   improper specimen collection / handling, submission of specimen other  than nasopharyngeal swab, presence of viral mutation(s) within the  areas targeted by this assay, and inadequate number of viral copies  (<250 copies / mL). A negative result must be combined with clinical  observations, patient history, and epidemiological information. If result is POSITIVE SARS-CoV-2 target nucleic acids are DETECTED. The SARS-CoV-2 RNA is generally detectable in upper and lower  respiratory specimens dur ing the acute phase of infection.  Positive  results are indicative of active infection with SARS-CoV-2.  Clinical  correlation with patient history and other diagnostic information is  necessary to determine patient infection status.  Positive results do  not rule out bacterial infection or co-infection with other viruses. If result is PRESUMPTIVE POSTIVE SARS-CoV-2 nucleic acids MAY BE PRESENT.   A presumptive positive result was obtained on the submitted specimen  and confirmed on repeat testing.  While 2019 novel coronavirus  (SARS-CoV-2) nucleic acids may be present in the submitted sample  additional confirmatory testing may be necessary for epidemiological  and / or clinical management purposes  to differentiate between  SARS-CoV-2 and other Sarbecovirus currently known to infect humans.  If clinically indicated additional testing with an alternate test  methodology [phone]) is advised. The SARS-CoV-2 RNA is generally  detectable in upper and lower respiratory sp ecimens during the acute  phase of infection. The expected result is Negative. Fact Sheet for Patients:  StrictlyIdeas.no Fact Sheet for Healthcare Providers: BankingDealers.co.za This test is not yet approved or cleared by the [place] FDA and has been authorized for detection and/or diagnosis of SARS-CoV-2 by FDA under an Emergency Use Authorization (EUA).  This EUA will  remain in effect (meaning this test can be used) for the duration of the COVID-19 declaration under Section 564(b)(1) of the Act, 21 U.S.C. section 360bbb-3(b)(1), unless the authorization is terminated or revoked sooner. Performed at [facility], [address] [place], [place] [address]   MRSA PCR Screening     Status: None   Collection Time: 04/07/2019  9:04 AM  Result Value Ref Range Status   MRSA by PCR NEGATIVE NEGATIVE Final    Comment:        The GeneXpert MRSA Assay (FDA approved for NASAL specimens only), is one component of a comprehensive MRSA colonization surveillance program. It is not intended to diagnose  MRSA infection nor to guide or monitor treatment for MRSA infections. Performed at [facility], [address] [place], [place] [address]   Culture, respiratory (non-expectorated)     Status: None   Collection Time: 05/01/19 11:05 AM  Result Value Ref Range Status   Specimen Description   Final    TRACHEAL ASPIRATE Performed at [facility], [address], [place], [place] [address]    Special Requests   Final    NONE Performed at [facility], [address] [place], [place] [address]    Gram Stain   Final    MODERATE WBC PRESENT, PREDOMINANTLY PMN RARE YEAST Performed at [facility], [place] [address], [place], [place] [address]    Culture FEW CANDIDA ALBICANS FEW CANDIDA TROPICALIS   Final   Report Status 05/04/2019 FINAL  Final    Coagulation Studies: No results for input(s): LABPROT, INR in the last 72 hours.  Urinalysis: No results for input(s): COLORURINE, LABSPEC, PHURINE, GLUCOSEU, HGBUR, BILIRUBINUR, KETONESUR, PROTEINUR, UROBILINOGEN, NITRITE, LEUKOCYTESUR in the last 72 hours.  Invalid input(s): APPERANCEUR    Imaging: Dg Abd 1 View  Result Date: 05/28/2019 CLINICAL DATA:  OG tube placement. EXAM: ABDOMEN - 1 VIEW COMPARISON:  04/04/2019. FINDINGS: OG tube noted with tip below left  hemidiaphragm. Severely distended loops of bowel are noted. Abdominal series suggested for further evaluation. No definite free air noted. IMPRESSION: NG tube noted with its tip under the left hemidiaphragm. Severely distended loops of bowel are noted. Abdominal series suggested for further evaluation. Electronically Signed   By: Marcello Moores  Register   On: 05/12/2019 15:50   Dg Chest Port 1 View  Result Date: 05/14/2019 CLINICAL DATA:  Intubation EXAM: PORTABLE CHEST 1 VIEW COMPARISON:  Portable exam 1454 hours compared to 05/07/2019 FINDINGS: Tip of endotracheal tube projects 2.1 cm above carina. Nasogastric tube extends into abdomen. Rotated to the RIGHT. LEFT jugular large-bore central venous catheter with tip projecting over SVC. Enlargement of cardiac silhouette with pulmonary vascular congestion. Probable pulmonary edema and layered RIGHT pleural effusion IMPRESSION: Tip of endotracheal tube projects 2.1 cm above carina. Question pulmonary edema and layering RIGHT pleural effusion. Electronically Signed   By: Lavonia Dana M.D.   On: 05/05/2019 15:49   Dg Chest Port 1 View  Result Date: 05/07/2019 CLINICAL DATA:  Shortness of breath. EXAM: PORTABLE CHEST 1 VIEW COMPARISON:  Radiographs of May 06, 2019. FINDINGS: Stable cardiomediastinal silhouette. Left internal jugular catheter is unchanged. No pneumothorax or significant pleural effusion is noted. Minimal bibasilar subsegmental atelectasis is noted. Bony thorax is unremarkable. IMPRESSION: Minimal bibasilar subsegmental atelectasis. Electronically Signed   By: Marijo Conception M.D.   On: 05/07/2019 09:55     Medications:   . [MAR Hold] sodium chloride Stopped (05/29/2019 0334)  . sodium chloride    . fentaNYL infusion INTRAVENOUS 250 mcg/hr (05/18/2019 1400)  . [MAR Hold] norepinephrine (LEVOPHED) Adult infusion 2 mcg/min (05/21/2019 1307)   . [MAR Hold] allopurinol  150 mg Per Tube Once per day on Tue Thu Sat  . [MAR Hold] amitriptyline  25 mg Per Tube  QHS  . [MAR Hold] atorvastatin  40 mg Per Tube Daily  . [MAR Hold] Chlorhexidine Gluconate Cloth  6 each Topical Daily  . [MAR Hold] citalopram  20 mg Per Tube Daily  . [MAR Hold] clopidogrel  75 mg Per Tube Daily  . [MAR Hold] epoetin (EPOGEN/PROCRIT) injection  10,000 Units Intravenous Q T,Th,Sa-HD  . [MAR Hold] famotidine  20 mg Per Tube Daily  . [MAR Hold] fentaNYL (SUBLIMAZE) injection  25 mcg Intravenous Once  . [MAR Hold] heparin injection (subcutaneous)  5,000 Units Subcutaneous Q8H  . [MAR Hold] insulin aspart  0-9 Units Subcutaneous Q4H  . [MAR Hold] insulin aspart  3 Units Subcutaneous Q4H  . [MAR Hold] insulin glargine  5 Units Subcutaneous QHS  . [MAR Hold] mouth rinse  15 mL Mouth Rinse BID  . [MAR Hold] midodrine  10 mg Per Tube TID WC  . [MAR Hold] multivitamin  15 mL Per Tube Daily  . [MAR Hold] senna-docusate  1 tablet Per Tube BID  . [MAR Hold] sodium chloride flush  10-40 mL Intracatheter Q12H   [MAR Hold] sodium chloride, [MAR Hold] albuterol, [MAR Hold] fentaNYL, [MAR Hold] fentaNYL (SUBLIMAZE) injection, [MAR Hold] lidocaine-prilocaine, [MAR Hold] midazolam, [MAR Hold] nystatin, [MAR Hold] polyethylene glycol, [MAR Hold] polyvinyl alcohol, [MAR Hold] sodium chloride flush  Assessment/ Plan:  Ms. [name] is a 63 y.o. black female with end stage renal disease on hemodialysis, hypertension, CVA, obstructive sleep apnea, peripheral vascular disease, hypertension, gout, GERD, diabetes mellitus type II, COPD, coronary artery disease, congestive heart failure, who  was admitted to [facility] on 04/13/2019 for pneumonia. Acute respiratory failure requiring mechanical ventilation and vasopressors.   TTS [facility]phrology [facility] left AVG   1. End stage renal disease  With generalized edema/volume overload Complication of dialysis device, clotted AVG.  HD today after Temp HD catheter is available. Unstable for Permcath Volume removal as tolerated  2. Anemia of  chronic kidney disease:  Lab Results  Component Value Date   HGB 8.7 (L) 05/06/2019   - EPO with HD treatment TTS  3. Sepsis/Hypotension:  - improved - off antibiotics now  4. Acute respiratory failure with cardiopulmonary arrest. Extubated 6/2, re-intubated 6/5 Volume removal as tolerated    LOS: [place] 6/5/20204:47 PM

## 2019-05-09 ENCOUNTER — Inpatient Hospital Stay: Payer: Medicare Other

## 2019-05-09 LAB — CBC WITH DIFFERENTIAL/PLATELET
Abs Immature Granulocytes: 0.22 10*3/uL — ABNORMAL HIGH (ref 0.00–0.07)
Basophils Absolute: 0.1 10*3/uL (ref 0.0–0.1)
Basophils Relative: 1 %
Eosinophils Absolute: 0.2 10*3/uL (ref 0.0–0.5)
Eosinophils Relative: 1 %
HCT: 28.6 % — ABNORMAL LOW (ref 36.0–46.0)
Hemoglobin: 8.6 g/dL — ABNORMAL LOW (ref 12.0–15.0)
Immature Granulocytes: 1 %
Lymphocytes Relative: 12 %
Lymphs Abs: 2 10*3/uL (ref 0.7–4.0)
MCH: 29.5 pg (ref 26.0–34.0)
MCHC: 30.1 g/dL (ref 30.0–36.0)
MCV: 97.9 fL (ref 80.0–100.0)
Monocytes Absolute: 1.3 10*3/uL — ABNORMAL HIGH (ref 0.1–1.0)
Monocytes Relative: 8 %
Neutro Abs: 12.8 10*3/uL — ABNORMAL HIGH (ref 1.7–7.7)
Neutrophils Relative %: 77 %
Platelets: 304 10*3/uL (ref 150–400)
RBC: 2.92 MIL/uL — ABNORMAL LOW (ref 3.87–5.11)
RDW: 18.6 % — ABNORMAL HIGH (ref 11.5–15.5)
WBC: 16.6 10*3/uL — ABNORMAL HIGH (ref 4.0–10.5)
nRBC: 1.7 % — ABNORMAL HIGH (ref 0.0–0.2)

## 2019-05-09 LAB — BLOOD GAS, ARTERIAL
Acid-base deficit: 2.8 mmol/L — ABNORMAL HIGH (ref 0.0–2.0)
Bicarbonate: 25.1 mmol/L (ref 20.0–28.0)
FIO2: 100
MECHVT: 500 mL
Mechanical Rate: 16
O2 Saturation: 85.7 %
PEEP: 12 cmH2O
Patient temperature: 37
pCO2 arterial: 56 mmHg — ABNORMAL HIGH (ref 32.0–48.0)
pH, Arterial: 7.26 — ABNORMAL LOW (ref 7.350–7.450)
pO2, Arterial: 59 mmHg — ABNORMAL LOW (ref 83.0–108.0)

## 2019-05-09 LAB — GLUCOSE, CAPILLARY
Glucose-Capillary: 101 mg/dL — ABNORMAL HIGH (ref 70–99)
Glucose-Capillary: 120 mg/dL — ABNORMAL HIGH (ref 70–99)
Glucose-Capillary: 122 mg/dL — ABNORMAL HIGH (ref 70–99)
Glucose-Capillary: 124 mg/dL — ABNORMAL HIGH (ref 70–99)
Glucose-Capillary: 140 mg/dL — ABNORMAL HIGH (ref 70–99)
Glucose-Capillary: 147 mg/dL — ABNORMAL HIGH (ref 70–99)

## 2019-05-09 LAB — BASIC METABOLIC PANEL
Anion gap: 14 (ref 5–15)
BUN: 41 mg/dL — ABNORMAL HIGH (ref 8–23)
CO2: 24 mmol/L (ref 22–32)
Calcium: 8.1 mg/dL — ABNORMAL LOW (ref 8.9–10.3)
Chloride: 100 mmol/L (ref 98–111)
Creatinine, Ser: 6.2 mg/dL — ABNORMAL HIGH (ref 0.44–1.00)
GFR calc Af Amer: 8 mL/min — ABNORMAL LOW (ref >60)
GFR calc non Af Amer: 7 mL/min — ABNORMAL LOW (ref >60)
Glucose, Bld: 161 mg/dL — ABNORMAL HIGH (ref 70–99)
Potassium: 4.1 mmol/L (ref 3.5–5.1)
Sodium: 138 mmol/L (ref 135–145)

## 2019-05-09 MED ORDER — VASOPRESSIN 20 UNIT/ML IV SOLN
0.0300 [IU]/min | INTRAVENOUS | Status: DC
  Administered 2019-05-09: 0.03 [IU]/min via INTRAVENOUS
  Filled 2019-05-09 (×2): qty 2

## 2019-05-09 MED ORDER — FENTANYL CITRATE (PF) 100 MCG/2ML IJ SOLN
50.0000 ug | INTRAMUSCULAR | Status: DC | PRN
  Administered 2019-05-19: 50 ug via INTRAVENOUS

## 2019-05-09 MED ORDER — VECURONIUM BROMIDE 10 MG IV SOLR
10.0000 mg | Freq: Once | INTRAVENOUS | Status: AC
  Administered 2019-05-09: 10 mg via INTRAVENOUS

## 2019-05-09 MED ORDER — LORAZEPAM 2 MG/ML IJ SOLN
2.0000 mg | INTRAMUSCULAR | Status: DC | PRN
  Administered 2019-05-09 – 2019-05-15 (×3): 2 mg via INTRAVENOUS
  Administered 2019-05-15: 4 mg via INTRAVENOUS
  Administered 2019-05-16 – 2019-05-19 (×3): 2 mg via INTRAVENOUS
  Administered 2019-05-20: 4 mg via INTRAVENOUS
  Filled 2019-05-09 (×3): qty 1
  Filled 2019-05-09 (×3): qty 2
  Filled 2019-05-09 (×4): qty 1

## 2019-05-09 MED ORDER — PIPERACILLIN-TAZOBACTAM 3.375 G IVPB: 3.3750 g | Freq: Two times a day (BID) | INTRAVENOUS | Status: DC

## 2019-05-09 MED ORDER — PUREFLOW DIALYSIS SOLUTION
INTRAVENOUS | Status: DC
  Administered 2019-05-09 – 2019-05-10 (×2): via INTRAVENOUS_CENTRAL

## 2019-05-09 MED ORDER — IPRATROPIUM-ALBUTEROL 0.5-2.5 (3) MG/3ML IN SOLN
3.0000 mL | Freq: Four times a day (QID) | RESPIRATORY_TRACT | Status: DC
  Administered 2019-05-09 (×2): 3 mL via RESPIRATORY_TRACT
  Filled 2019-05-09 (×2): qty 3

## 2019-05-09 MED ORDER — HYDROCORTISONE NA SUCCINATE PF 100 MG IJ SOLR
50.0000 mg | Freq: Four times a day (QID) | INTRAMUSCULAR | Status: DC
  Administered 2019-05-09 – 2019-05-14 (×19): 50 mg via INTRAVENOUS
  Filled 2019-05-09 (×19): qty 2

## 2019-05-09 MED ORDER — PIPERACILLIN-TAZOBACTAM 3.375 G IVPB
3.3750 g | Freq: Two times a day (BID) | INTRAVENOUS | Status: DC
  Administered 2019-05-09: 21:00:00 3.375 g via INTRAVENOUS
  Filled 2019-05-09: qty 50

## 2019-05-09 MED ORDER — HEPARIN SODIUM (PORCINE) 1000 UNIT/ML DIALYSIS
1000.0000 [IU] | INTRAMUSCULAR | Status: DC | PRN
  Administered 2019-05-12 – 2019-05-17 (×3): 1000 [IU] via INTRAVENOUS_CENTRAL
  Filled 2019-05-09: qty 6
  Filled 2019-05-09: qty 2
  Filled 2019-05-09 (×7): qty 6

## 2019-05-09 MED ORDER — BUDESONIDE 0.5 MG/2ML IN SUSP
0.5000 mg | Freq: Two times a day (BID) | RESPIRATORY_TRACT | Status: DC
  Administered 2019-05-09 – 2019-05-20 (×22): 0.5 mg via RESPIRATORY_TRACT
  Filled 2019-05-09 (×22): qty 2

## 2019-05-09 MED ORDER — IPRATROPIUM-ALBUTEROL 0.5-2.5 (3) MG/3ML IN SOLN
3.0000 mL | RESPIRATORY_TRACT | Status: DC
  Administered 2019-05-09 – 2019-05-20 (×64): 3 mL via RESPIRATORY_TRACT
  Filled 2019-05-09 (×65): qty 3

## 2019-05-09 MED ORDER — PIPERACILLIN-TAZOBACTAM 3.375 G IVPB
3.3750 g | Freq: Three times a day (TID) | INTRAVENOUS | Status: DC
  Administered 2019-05-10 – 2019-05-14 (×13): 3.375 g via INTRAVENOUS
  Filled 2019-05-09 (×13): qty 50

## 2019-05-09 MED ORDER — VECURONIUM BROMIDE 10 MG IV SOLR
10.0000 mg | INTRAVENOUS | Status: DC | PRN
  Administered 2019-05-10: 10 mg via INTRAVENOUS
  Filled 2019-05-09: qty 10

## 2019-05-09 MED ORDER — ALBUMIN HUMAN 25 % IV SOLN
12.5000 g | Freq: Once | INTRAVENOUS | Status: DC
  Filled 2019-05-09: qty 50

## 2019-05-09 NOTE — Progress Notes (Addendum)
   05/09/19 0328  Vitals  BP (!) 102/56  MAP (mmHg) 69  Pulse Rate (!) 108  ECG Heart Rate (!) 108  Resp 16  Oxygen Therapy  SpO2 (!) 85 %  O2 Device Ventilator  FiO2 (%) 100 %   Pt with sats in the 80s.  RT at bedside.  Multiple suction attempts.  Large amounts of thick, yellow sputum.  Pt with increasing rhonchi and crackles throughout the shift

## 2019-05-09 NOTE — Progress Notes (Signed)
   05/09/19 2100  Clinical Encounter Type  Visited With Family  Visit Type Follow-up;Spiritual support  Referral From Nurse  Consult/Referral To Chaplain  Spiritual Encounters  Spiritual Needs Prayer;Emotional  Stress Factors  Family Stress Factors Major life changes   Chaplain returned to support the daughter and grandson of the patient upon their arrival. They sat at the bedside and spoke lovingly to the patient. 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 Through tears she offered a prayer for her mother and invoked the presence and hand of God in her circumstances. Chaplain offered compassionate presence and a listening ear. The patient's daughter understands the gravity of her mother's prognosis and does not want to lose her, but also affirms that she is "in God's hands" and is becoming "prepared" for what is coming. She and her son (patient's grandson) continued to sit at the bedside and spend time with the patient.

## 2019-05-09 NOTE — Progress Notes (Signed)
This note also relates to the following rows which could not be included: Pulse Rate - Cannot attach notes to unvalidated device data Resp - Cannot attach notes to unvalidated device data SpO2 - Cannot attach notes to unvalidated device data End Tidal CO2 (EtCO2) - Cannot attach notes to unvalidated device data  Blood returned due low O2 saturation/ impr   05/09/19 1715  Vital Signs  Pulse Rate Source Monitor     05/09/19 1715  Vital Signs  Pulse Rate Source Monitor  oved post rinse back

## 2019-05-09 NOTE — Consult Note (Signed)
[name], [name] [id number] Jan 24, 1956 Carolyn Pelt, MD  Reason for Consult: Failed extubation  HPI: Critically ill patient with multiple medical illnesses has failed extubation requiring emergent reintubation.  ENT has been consulted for possible tracheostomy tube placement.  Allergies:  Allergies  Allergen Reactions  . Iodine Rash  . Enalapril Other (See Comments)    TONGUE SWELLS/GOUT  . Iodinated Diagnostic Agents Other (See Comments)    BREAK OUT IN WHELPS      ROS: Review of systems normal other than 12 systems except per HPI.  PMH:  Past Medical History:  Diagnosis Date  . Anemia   . Anxiety   . Arthritis   . Cancer [facility])    Left Kidney Cancer  . CHF (congestive heart failure) ([place])   . Chronic kidney disease   . COPD (chronic obstructive pulmonary disease) (H[facility])    Use Oxygen at bedtime  . Coronary artery disease   . Diabetes mellitus without complication ([place])   . Dialysis patient ([place])    Tues, Thurs, Sat  . Dyspnea    with exertion  . GERD (gastroesophageal reflux disease)   . Gout   . History of kidney stones   . Hypertension   . Neuropathy   . Peripheral vascular disease ([place])   . Sleep apnea    OSA--USE C-PAP  . Stroke [facility]) 2004    FH:  Family History  Problem Relation Age of Onset  . Hypertension Mother   . Heart failure Mother   . Diabetes Father   . Heart attack Father   . Lung cancer Maternal Aunt   . Cancer Maternal Aunt     SH:  Social History   Socioeconomic History  . Marital status: Married    Spouse name: Not on file  . Number of children: Not on file  . Years of education: Not on file  . Highest education level: Not on file  Occupational History  . Not on file  Social Needs  . Financial resource strain: Not on file  . Food insecurity:    Worry: Not on file    Inability: Not on file  . Transportation needs:    Medical: Not on file    Non-medical: Not on file  Tobacco Use  . Smoking status: Former Research scientist (life sciences)  .  Smokeless tobacco: Never Used  Substance and Sexual Activity  . Alcohol use: No  . Drug use: No  . Sexual activity: Never  Lifestyle  . Physical activity:    Days per week: Not on file    Minutes per session: Not on file  . Stress: Not on file  Relationships  . Social connections:    Talks on phone: Not on file    Gets together: Not on file    Attends religious service: Not on file    Active member of club or organization: Not on file    Attends meetings of clubs or organizations: Not on file    Relationship status: Not on file  . Intimate partner violence:    Fear of current or ex partner: Not on file    Emotionally abused: Not on file    Physically abused: Not on file    Forced sexual activity: Not on file  Other Topics Concern  . Not on file  Social History Narrative  . Not on file    PSH:  Past Surgical History:  Procedure Laterality Date  . A/V FISTULAGRAM Left 04/26/2017   Procedure: A/V Fistulagram;  Surgeon: Delana Meyer,  Dolores Lory, MD;  Location: [place] [facility];  Service: Cardiovascular;  Laterality: Left;  . ABDOMINAL HYSTERECTOMY    . CARDIAC CATHETERIZATION    . DIALYSIS/PERMA CATHETER INSERTION N/A 01/31/2017   Procedure: Dialysis/Perma Catheter Insertion;  Surgeon: Algernon Huxley, MD;  Location: [place] [facility];  Service: Cardiovascular;  Laterality: N/A;  . DIALYSIS/PERMA CATHETER INSERTION N/A 02/21/2017   Procedure: Dialysis/Perma Catheter Insertion;  Surgeon: Algernon Huxley, MD;  Location: [place] [facility];  Service: Cardiovascular;  Laterality: N/A;  . DIALYSIS/PERMA CATHETER INSERTION N/A 03/19/2017   Procedure: Dialysis/Perma Catheter Insertion;  Surgeon: Katha Cabal, MD;  Location: [place] [facility];  Service: Cardiovascular;  Laterality: N/A;  . DIALYSIS/PERMA CATHETER REMOVAL N/A 07/19/2017   Procedure: DIALYSIS/PERMA CATHETER REMOVAL;  Surgeon: Katha Cabal, MD;  Location: [place] [facility];  Service: Cardiovascular;  Laterality:  N/A;  . EYE SURGERY Bilateral    Cataract Extraction with IOL  . KIDNEY SURGERY Left    Partial Nephrectomy  . LEFT HEART CATH AND CORONARY ANGIOGRAPHY N/A 04/05/2017   Procedure: Left Heart Cath and Coronary Angiography;  Surgeon: Wellington Hampshire, MD;  Location: [place] [facility];  Service: Cardiovascular;  Laterality: N/A;  . PERIPHERAL VASCULAR CATHETERIZATION N/A 01/23/2016   Procedure: Dialysis/Perma Catheter Insertion;  Surgeon: Algernon Huxley, MD;  Location: [place] [facility];  Service: Cardiovascular;  Laterality: N/A;  . PERIPHERAL VASCULAR CATHETERIZATION Left 10/01/2016   Procedure: A/V Shuntogram/Fistulagram;  Surgeon: Algernon Huxley, MD;  Location: [place] [facility];  Service: Cardiovascular;  Laterality: Left;  . PERIPHERAL VASCULAR THROMBECTOMY Left 11/24/2018   Procedure: PERIPHERAL VASCULAR THROMBECTOMY;  Surgeon: Algernon Huxley, MD;  Location: [place] [facility];  Service: Cardiovascular;  Laterality: Left;  . UPPER EXTREMITY VENOGRAPHY Bilateral 04/26/2017   Procedure: Upper Extremity Venography;  Surgeon: Katha Cabal, MD;  Location: [place] [facility];  Service: Cardiovascular;  Laterality: Bilateral;  . VASCULAR ACCESS DEVICE INSERTION Left 05/22/2017   Procedure: INSERTION OF HERO VASCULAR ACCESS DEVICE;  Surgeon: Katha Cabal, MD;  Location: [facility];  Service: Vascular;  Laterality: Left;    Physical  Exam: Critically ill patient intubated and sedated, morbidly obese no previous anterior neck scars noted.  This appeared to be a central line in the left neck.  Her nose is clear, ears are normal.   A/P: Critically ill patient ventilator dependent.  Agree with Dr. Zoila Shutter assessment I think this patient would benefit from tracheostomy.  Due to her obesity this will require two ENT surgeons to perform the procedure I will speak with our scheduling secretary on Monday when the office reopens and try to schedule this procedure early next week.  Patient was seen  in the ICU for critical care approximately 45 minutes.   Roena Malady 05/09/2019 11:48 AM

## 2019-05-09 NOTE — Consult Note (Addendum)
Pharmacy Antibiotic Note  [name] is a 63 y.o. female admitted on 05/02/2019 with pneumonia.  Pharmacy has been consulted for Zosyn dosing. Patient is receiving CRRT.  Plan: Zosyn 3.375g IV q8h (4 hour infusion).   Height: 5' 5.98" (167.6 cm) Weight: (!) 314 lb 2.5 oz (142.5 kg) IBW/kg (Calculated) : 59.26  Temp (24hrs), Avg:99.1 F (37.3 C), Min:97.7 F (36.5 C), Max:100.8 F (38.2 C)  Recent Labs  Lab 05/03/19 0439 05/04/19 0436 05/05/19 0458 05/06/19 0437 05/05/2019 0445 05/09/19 0507  WBC 9.8  --  11.4* 10.9*  --  16.6*  CREATININE 3.40* 4.90* 6.17* 5.41* 5.92* 6.20*    Estimated Creatinine Clearance: 13.4 mL/min (A) (by C-G formula based on SCr of 6.2 mg/dL (H)).    Allergies  Allergen Reactions  . Iodine Rash  . Enalapril Other (See Comments)    TONGUE SWELLS/GOUT  . Iodinated Diagnostic Agents Other (See Comments)    BREAK OUT IN WHELPS      Antimicrobials this admission: Zosyn 6/6 >>   Thank you for allowing pharmacy to be a part of this patient's care.  Pearla Dubonnet 05/09/2019 6:39 PM

## 2019-05-09 NOTE — Progress Notes (Signed)
Patient with acute and severe hypoxia Near cardiac arrest Family updated and notified, patient high risk for death I have advised family to come to bedside due to imminent death.

## 2019-05-09 NOTE — Progress Notes (Signed)
   05/09/19 0536  Vitals  Pulse Rate (!) 109  ECG Heart Rate (!) 110  Resp 16  Oxygen Therapy  SpO2 (!) 78 %  FiO2 (%) 100 %  Pulse Oximetry Type Continuous  End Tidal CO2 (EtCO2) 51   Pt with desaturation.  RT at bedside.  PEEP increased to 12 for an O2 sat of 88%.  Pt suctioned and repositioned. ABG completed.  Patria Mane NP notified.  Ph 7.26, PCO2 56, PaO2 59, O2sat 90.4%, HCO3 25.1.  Per order chest PT started with duoneb.

## 2019-05-09 NOTE — Progress Notes (Signed)
   05/09/19 2000  Clinical Encounter Type  Visited With Family;Health care provider  Visit Type Initial;Spiritual support  Referral From Nurse  Consult/Referral To Chaplain  Stress Factors  Patient Stress Factors Not reviewed  Family Stress Factors Health changes;Loss;Major life changes   Chaplain received a page from the unit secretary regarding the patient's decline. Upon arrival, the patient's husband was at the bedside. He shared stories of his love for his wife, their nearly 30-year marriage, and the devotion they have had to one another through the years. He described their commitment to one another as being "like a river through different types of land"; no matter the topography that water flowed through, it would continue to flow. He sees their relationship and their love story in a similar way. They've been through a lot and yet they have chosen to do it together. He expressed difficulty in imagining his life without her and did not expect to be "here". Prayer requested for the patient and for strength; the chaplain offered prayer and comforted him. During the visit, a tele-visit was connected with a loved one Helene Kelp) who was also tearful. The patient's husband later excused himself and updated this Probation officer that his daughter, who was en route from [place], would be arriving in about 30 mins. Chaplain will return to support the family at that time.

## 2019-05-09 NOTE — Progress Notes (Signed)
[facility]  ROUNDING NOTE   Subjective:   Patient underwent hemodialysis yesterday successfully.  4 L of fluid has been removed so far in 2 sessions.  Tolerated well and was on nasal cannula at the end of dialysis treatment.  However, this became short of breath again and could not get her PermCath placed.  She was shifted to ICU where she had to be intubated. temporary dialysis catheter placed Friday 1500 cc removed with HD yesterday Currently requiring Nor epi  Objective:  Vital signs in last 24 hours:  Temp:  [97.7 F (36.5 C)-99.3 F (37.4 C)] 98.8 F (37.1 C) (06/06 0800) Pulse Rate:  [81-114] 113 (06/06 0800) Resp:  [9-26] 13 (06/06 0800) BP: (59-151)/(38-69) 100/53 (06/06 0800) SpO2:  [78 %-100 %] 96 % (06/06 0816) FiO2 (%):  [60 %-100 %] 80 % (06/06 0816) Weight:  [140.7 kg] 140.7 kg (06/05 2005)  Weight change:  Filed Weights   05/06/19 0500 05/06/19 0900 05/04/2019 2005  Weight: (!) 144.6 kg (!) 144.8 kg (!) 140.7 kg    Intake/Output: I/O last 3 completed shifts: In: 1161.3 [I.V.:1111.4; IV Piggyback:49.9] Out: 1500 [Other:1500]   Intake/Output this shift:  No intake/output data recorded.  Physical Exam: General: Chronically ill appearing  HEENT  ET tube, OG tube  Neck:  no masses  Lungs:   Ventilator assisted, difficult exam due to body habitus  Heart: Regular rate and rhythm, tachycardic  Abdomen:  Soft, nontender, obese  Extremities:  + peripheral edema.  Neurologic:  Alert and oriented  Skin:  warm  Access: +thrombosed left arm AVG, rt fem temp cath placed 6/5    Basic Metabolic Panel: Recent Labs  Lab 05/03/19 0439 05/04/19 0436 05/05/19 0458 05/06/19 0437 05/07/2019 0445 05/09/19 0507  NA 137 136 136 136 139 138  K 3.6 3.7 3.8 3.8 3.7 4.1  CL 101 102 101 100 99 100  CO2 27 25 25 22 23 24   GLUCOSE 206* 222* 177* 139* 142* 161*  BUN 46* 75* 98* 68* 51* 41*  CREATININE 3.40* 4.90* 6.17* 5.41* 5.92* 6.20*  CALCIUM 7.9* 7.9* 7.8*  7.9* 8.5* 8.1*  MG 1.9 2.0  --   --   --   --   PHOS 2.4*  --   --  4.2 3.5  --     Liver Function Tests: Recent Labs  Lab 05/03/19 0439 05/06/19 0437 05/18/2019 0445  AST 11*  --   --   ALT 14  --   --   ALKPHOS 103  --   --   BILITOT 0.5  --   --   PROT 5.8*  --   --   ALBUMIN 2.3* 2.8* 3.0*   No results for input(s): LIPASE, AMYLASE in the last 168 hours. No results for input(s): AMMONIA in the last 168 hours.  CBC: Recent Labs  Lab 05/03/19 0439 05/05/19 0458 05/06/19 0437 05/09/19 0507  WBC 9.8 11.4* 10.9* 16.6*  NEUTROABS  --  7.9*  --  12.8*  HGB 8.3* 8.5* 8.7* 8.6*  HCT 26.8* 26.7* 27.6* 28.6*  MCV 95.0 92.4 93.2 97.9  PLT 170 243 259 304    Cardiac Enzymes: Recent Labs  Lab 05/06/19 0437  TROPONINI 0.03*    BNP: Invalid input(s): POCBNP  CBG: Recent Labs  Lab 05/26/2019 1719 05/12/2019 1929 05/19/2019 2328 05/09/19 0318 05/09/19 0740  GLUCAP 126* 110* 114* 120* 140*    Microbiology: Results for orders placed or performed during the hospital encounter of 04/27/2019  SARS Coronavirus 2 (CEPHEID - Performed in [place] hospital lab), Hosp Order     Status: None   Collection Time: 04/23/2019  7:31 AM  Result Value Ref Range Status   SARS Coronavirus 2 NEGATIVE NEGATIVE Final    Comment: (NOTE) If result is NEGATIVE SARS-CoV-2 target nucleic acids are NOT DETECTED. The SARS-CoV-2 RNA is generally detectable in upper and lower  respiratory specimens during the acute phase of infection. The lowest  concentration of SARS-CoV-2 viral copies this assay can detect is 250  copies / mL. A negative result does not preclude SARS-CoV-2 infection  and should not be used as the sole basis for treatment or other  patient management decisions.  A negative result may occur with  improper specimen collection / handling, submission of specimen other  than nasopharyngeal swab, presence of viral mutation(s) within the  areas targeted by this assay, and inadequate  number of viral copies  (<250 copies / mL). A negative result must be combined with clinical  observations, patient history, and epidemiological information. If result is POSITIVE SARS-CoV-2 target nucleic acids are DETECTED. The SARS-CoV-2 RNA is generally detectable in upper and lower  respiratory specimens dur ing the acute phase of infection.  Positive  results are indicative of active infection with SARS-CoV-2.  Clinical  correlation with patient history and other diagnostic information is  necessary to determine patient infection status.  Positive results do  not rule out bacterial infection or co-infection with other viruses. If result is PRESUMPTIVE POSTIVE SARS-CoV-2 nucleic acids MAY BE PRESENT.   A presumptive positive result was obtained on the submitted specimen  and confirmed on repeat testing.  While 2019 novel coronavirus  (SARS-CoV-2) nucleic acids may be present in the submitted sample  additional confirmatory testing may be necessary for epidemiological  and / or clinical management purposes  to differentiate between  SARS-CoV-2 and other Sarbecovirus currently known to infect humans.  If clinically indicated additional testing with an alternate test  methodology [phone]) is advised. The SARS-CoV-2 RNA is generally  detectable in upper and lower respiratory sp ecimens during the acute  phase of infection. The expected result is Negative. Fact Sheet for Patients:  StrictlyIdeas.no Fact Sheet for Healthcare Providers: BankingDealers.co.za This test is not yet approved or cleared by the [place] FDA and has been authorized for detection and/or diagnosis of SARS-CoV-2 by FDA under an Emergency Use Authorization (EUA).  This EUA will remain in effect (meaning this test can be used) for the duration of the COVID-19 declaration under Section 564(b)(1) of the Act, 21 U.S.C. section 360bbb-3(b)(1), unless the  authorization is terminated or revoked sooner. Performed at [facility], [address] [place], [place] [address]   MRSA PCR Screening     Status: None   Collection Time: 04/12/2019  9:04 AM  Result Value Ref Range Status   MRSA by PCR NEGATIVE NEGATIVE Final    Comment:        The GeneXpert MRSA Assay (FDA approved for NASAL specimens only), is one component of a comprehensive MRSA colonization surveillance program. It is not intended to diagnose MRSA infection nor to guide or monitor treatment for MRSA infections. Performed at [facility], [address] [place], [place] [address]   Culture, respiratory (non-expectorated)     Status: None   Collection Time: 05/01/19 11:05 AM  Result Value Ref Range Status   Specimen Description   Final    TRACHEAL ASPIRATE Performed at [facility], [address] [place], [place] [address]    Special Requests   Final    NONE Performed at [facility], [address] [place], [place] [address]    Gram Stain   Final    MODERATE WBC PRESENT, PREDOMINANTLY PMN RARE YEAST Performed at [facility], [place] [address] [place], [place] [address]    Culture FEW CANDIDA ALBICANS FEW CANDIDA TROPICALIS   Final   Report Status 05/04/2019 FINAL  Final    Coagulation Studies: No results for input(s): LABPROT, INR in the last 72 hours.  Urinalysis: No results for input(s): COLORURINE, LABSPEC, PHURINE, GLUCOSEU, HGBUR, BILIRUBINUR, KETONESUR, PROTEINUR, UROBILINOGEN, NITRITE, LEUKOCYTESUR in the last 72 hours.  Invalid input(s): APPERANCEUR    Imaging: Dg Abd 1 View  Result Date: 06/01/2019 CLINICAL DATA:  OG tube placement. EXAM: ABDOMEN - 1 VIEW COMPARISON:  04/27/2019. FINDINGS: OG tube noted with tip below left hemidiaphragm. Severely distended loops of bowel are noted. Abdominal series suggested for further evaluation. No definite free air noted. IMPRESSION: NG tube noted with its tip  under the left hemidiaphragm. Severely distended loops of bowel are noted. Abdominal series suggested for further evaluation. Electronically Signed   By: Marcello Moores  Register   On: 05/07/2019 15:50   Dg Chest Port 1 View  Result Date: 05/09/2019 CLINICAL DATA:  Acute respiratory failure EXAM: PORTABLE CHEST 1 VIEW COMPARISON:  05/29/2019 FINDINGS: Endotracheal tube tip is just above the level of the carina. Retraction by 2-3 cm would place it at the level of the clavicular heads. Enteric tube side port projects over the stomach. Left IJ large bore central venous catheter is unchanged. Bibasilar atelectasis. IMPRESSION: Endotracheal tube tip just above the level of the carina. Retraction by 2-3 cm is recommended. Electronically Signed   By: Ulyses Jarred M.D.   On: 05/09/2019 06:21   Dg Chest Port 1 View  Result Date: 05/30/2019 CLINICAL DATA:  Intubation EXAM: PORTABLE CHEST 1 VIEW COMPARISON:  Portable exam 1454 hours compared to 05/07/2019 FINDINGS: Tip of endotracheal tube projects 2.1 cm above carina. Nasogastric tube extends into abdomen. Rotated to the RIGHT. LEFT jugular large-bore central venous catheter with tip projecting over SVC. Enlargement of cardiac silhouette with pulmonary vascular congestion. Probable pulmonary edema and layered RIGHT pleural effusion IMPRESSION: Tip of endotracheal tube projects 2.1 cm above carina. Question pulmonary edema and layering RIGHT pleural effusion. Electronically Signed   By: Lavonia Dana M.D.   On: 05/07/2019 15:49   Dg Chest Port 1 View  Result Date: 05/07/2019 CLINICAL DATA:  Shortness of breath. EXAM: PORTABLE CHEST 1 VIEW COMPARISON:  Radiographs of May 06, 2019. FINDINGS: Stable cardiomediastinal silhouette. Left internal jugular catheter is unchanged. No pneumothorax or significant pleural effusion is noted. Minimal bibasilar subsegmental atelectasis is noted. Bony thorax is unremarkable. IMPRESSION: Minimal bibasilar subsegmental atelectasis. Electronically  Signed   By: Marijo Conception M.D.   On: 05/07/2019 09:55     Medications:   . sodium chloride Stopped (05/29/2019 0334)  . sodium chloride Stopped (05/27/2019 1724)  . fentaNYL infusion INTRAVENOUS 200 mcg/hr (05/09/19 0600)  . norepinephrine (LEVOPHED) Adult infusion 18 mcg/min (05/09/19 0600)  . norepinephrine (LEVOPHED) Adult infusion Stopped (05/09/19 0018)  . propofol (DIPRIVAN) infusion 30 mcg/kg/min (05/09/19 0600)   . allopurinol  150 mg Per Tube Once per day on Tue Thu Sat  . amitriptyline  25 mg Per Tube QHS  . atorvastatin  40 mg Per Tube Daily  . Chlorhexidine Gluconate Cloth  6 each  Topical Daily  . citalopram  20 mg Per Tube Daily  . clopidogrel  75 mg Per Tube Daily  . epoetin (EPOGEN/PROCRIT) injection  10,000 Units Intravenous Q T,Th,Sa-HD  . famotidine  20 mg Per Tube Daily  . fentaNYL (SUBLIMAZE) injection  25 mcg Intravenous Once  . heparin injection (subcutaneous)  5,000 Units Subcutaneous Q8H  . insulin aspart  0-9 Units Subcutaneous Q4H  . insulin aspart  3 Units Subcutaneous Q4H  . insulin glargine  5 Units Subcutaneous QHS  . ipratropium-albuterol  3 mL Nebulization Q6H  . mouth rinse  15 mL Mouth Rinse 10 times per day  . midodrine  10 mg Per Tube TID WC  . multivitamin  15 mL Per Tube Daily  . senna-docusate  1 tablet Per Tube BID  . sodium chloride flush  10-40 mL Intracatheter Q12H   sodium chloride, albuterol, fentaNYL, fentaNYL (SUBLIMAZE) injection, lidocaine-prilocaine, midazolam, nystatin, polyethylene glycol, polyvinyl alcohol, sodium chloride flush  Assessment/ Plan:  Ms. [name] is a 63 y.o. black female with end stage renal disease on hemodialysis, hypertension, CVA, obstructive sleep apnea, peripheral vascular disease, hypertension, gout, GERD, diabetes mellitus type II, COPD, coronary artery disease, congestive heart failure, who  was admitted to [facility] on 04/19/2019 for pneumonia. Acute respiratory failure requiring mechanical ventilation  and vasopressors.   TTS [facility] left AVG   1. End stage renal disease  With generalized edema/volume overload Complication of dialysis device, clotted AVG.  1500 cc removed with HD yesterday Assess daily for need of HD Requiring pressors today  2. Anemia of chronic kidney disease:  Lab Results  Component Value Date   HGB 8.6 (L) 05/09/2019   - EPO with HD treatment TTS  3. Sepsis/Hypotension:  - requiring pressors  4. Acute respiratory failure with cardiopulmonary arrest. Extubated 6/2, re-intubated 6/5 Volume removal as tolerated Assess for HD daily    LOS: 11 Harmeet Singh 6/6/20209:34 AM

## 2019-05-09 NOTE — Progress Notes (Signed)
Pt "stacking breaths" on ventilator.  Sedation increased for vent compliance.

## 2019-05-09 NOTE — Progress Notes (Signed)
Nutrition Follow-up  RD working remotely.  DOCUMENTATION CODES:   Morbid obesity  INTERVENTION:   Once tube feeds initiated recommend Vital HP @25ml /hr + Prostat 52ml TID  Free water flushes 27ml q4 hours to maintain tube patency  Propofol: 26.1 ml/hr- provides 689kcal/day  Regimen provides 1889kcal/day, 143g/day protein, 663ml/day free water   Continue liquid MVI via tube   Recommend B-Complex with C daily via tube   NUTRITION DIAGNOSIS:   Inadequate oral intake related to inability to eat as evidenced by NPO status.  GOAL:   Provide needs based on ASPEN/SCCM guidelines  MONITOR:   Vent status, Labs, I & O's, Skin  ASSESSMENT:   63 year old female with PMHx of DM, ESRD on HD, HTN, gout, hx CVA 2004, PVD, CHF, OSA, COPD, anxiety, GERD, arthritis, neuropathy, hx renal cell carcinoma s/p partial left nephrectomy, CAD admitted after cardiac arrest s/p ACLS and intubation on 5/26, with severe hyperkalemia.  Pt developed SOB and required intubation on 6/5. OGT placed and noted with tip below left hemidiaphragm. Severely distended loops of bowel are also noted. No plans for tube feeds today. Recommendations above when medically appropriate.   Medications reviewed and include: allopurinol, celexa, plavix, epoetin, pepcid, heparin, insulin, MVI, senokot, fentanyl, levophed, propofol   Labs reviewed: BUN 41(H), creat 6.20(H) Wbc- 16.6(H), Hgb 8.6(L), Hct 28.6(L) cbgs- 120, 140 x 24 hrs  Patient is currently intubated on ventilator support MV: 7.9 L/min Temp (24hrs), Avg:98.5 F (36.9 C), Min:97.7 F (36.5 C), Max:99.3 F (37.4 C)  Propofol: 26.1 ml/hr- provides 689kcal/day  MAP- >84mmHg  Diet Order:   Diet Order            Diet NPO time specified  Diet effective midnight             EDUCATION NEEDS:   No education needs have been identified at this time  Skin:  Skin Assessment: Skin Integrity Issues:(MSAD to breasts)  Last BM:  04/15/2019 per  chart  Height:   Ht Readings from Last 1 Encounters:  05/03/19 5' 5.98" (1.676 m)   Weight:   Wt Readings from Last 1 Encounters:  05/04/2019 (!) 140.7 kg   Ideal Body Weight:  59 kg  BMI:  Body mass index is 50.09 kg/m.  Estimated Nutritional Needs:   Kcal:  1455-1852 (11-14 kcal/kg)  Protein:  148 grams (2.5 grams/kg IBW)  Fluid:  UOP + 1 L  Koleen Distance MS, RD, LDN Pager #- [phone] Office#- [phone] After Hours Pager: [phone]

## 2019-05-09 NOTE — Progress Notes (Signed)
[facility] [place] NAME: [name]    MR#:  [id number]  DATE OF BIRTH:  January 05, 1956     Remains intubated this morning.  Requiring full vent support.   REVIEW OF SYSTEMS:   Unable to obtain due to patient being intubated and sedated  DRUG ALLERGIES:   Allergies  Allergen Reactions  . Iodine Rash  . Enalapril Other (See Comments)    TONGUE SWELLS/GOUT  . Iodinated Diagnostic Agents Other (See Comments)    BREAK OUT IN WHELPS      VITALS:  Blood pressure (!) 94/58, pulse (!) 111, temperature 98.8 F (37.1 C), temperature source Axillary, resp. rate 11, height 5' 5.98" (1.676 m), weight (!) 140.7 kg, SpO2 90 %.  PHYSICAL EXAMINATION:  GENERAL:  63 y.o.-morbidly obese critically ill-appearing EYES: Pupils equal, round, reactive to light  No scleral icterus.  HEENT: Head atraumatic, normocephalic.  Currently intubated, sedated. ETT in place. NECK:  Supple, no jugular venous distention. No thyroid enlargement, no tenderness.  LUNGS: +diffuse rhonchi present CARDIOVASCULAR: RRR, S1, S2 normal. No murmurs, rubs, or gallops.  ABDOMEN: Soft, nontender, nondistended. Bowel sounds present. No organomegaly or mass.  EXTREMITIES: Positive pedal edema, cyanosis, or clubbing.  NEUROLOGIC: Unable to do neuro exam because of intubation, sedation. PSYCHIATRIC: Intubated, sedated.   SKIN: No obvious rash, lesion, or ulcer.    LABORATORY PANEL:   CBC Recent Labs  Lab 05/09/19 0507  WBC 16.6*  HGB 8.6*  HCT 28.6*  PLT 304   ------------------------------------------------------------------------------------------------------------------  Chemistries  Recent Labs  Lab 05/03/19 0439 05/04/19 0436  05/09/19 0507  NA 137 136   < > 138  K 3.6 3.7   < > 4.1  CL 101 102   < > 100  CO2 27 25   < > 24  GLUCOSE 206* 222*   < > 161*  BUN 46* 75*   < > 41*  CREATININE 3.40* 4.90*   < > 6.20*  CALCIUM 7.9* 7.9*   < > 8.1*   MG 1.9 2.0  --   --   AST 11*  --   --   --   ALT 14  --   --   --   ALKPHOS 103  --   --   --   BILITOT 0.5  --   --   --    < > = values in this interval not displayed.   ------------------------------------------------------------------------------------------------------------------  Cardiac Enzymes Recent Labs  Lab 05/06/19 0437  TROPONINI 0.03*   ------------------------------------------------------------------------------------------------------------------  RADIOLOGY:  Dg Abd 1 View  Result Date: 05/23/2019 CLINICAL DATA:  OG tube placement. EXAM: ABDOMEN - 1 VIEW COMPARISON:  04/08/2019. FINDINGS: OG tube noted with tip below left hemidiaphragm. Severely distended loops of bowel are noted. Abdominal series suggested for further evaluation. No definite free air noted. IMPRESSION: NG tube noted with its tip under the left hemidiaphragm. Severely distended loops of bowel are noted. Abdominal series suggested for further evaluation. Electronically Signed   By: Marcello Moores  Register   On: 05/24/2019 15:50   Dg Chest Port 1 View  Result Date: 05/09/2019 CLINICAL DATA:  Acute respiratory failure EXAM: PORTABLE CHEST 1 VIEW COMPARISON:  05/21/2019 FINDINGS: Endotracheal tube tip is just above the level of the carina. Retraction by 2-3 cm would place it at the level of the clavicular heads. Enteric tube side port projects over the stomach. Left IJ large bore central venous catheter is unchanged. Bibasilar atelectasis. IMPRESSION:  Endotracheal tube tip just above the level of the carina. Retraction by 2-3 cm is recommended. Electronically Signed   By: Ulyses Jarred M.D.   On: 05/09/2019 06:21   Dg Chest Port 1 View  Result Date: 05/12/2019 CLINICAL DATA:  Intubation EXAM: PORTABLE CHEST 1 VIEW COMPARISON:  Portable exam 1454 hours compared to 05/07/2019 FINDINGS: Tip of endotracheal tube projects 2.1 cm above carina. Nasogastric tube extends into abdomen. Rotated to the RIGHT. LEFT jugular  large-bore central venous catheter with tip projecting over SVC. Enlargement of cardiac silhouette with pulmonary vascular congestion. Probable pulmonary edema and layered RIGHT pleural effusion IMPRESSION: Tip of endotracheal tube projects 2.1 cm above carina. Question pulmonary edema and layering RIGHT pleural effusion. Electronically Signed   By: Lavonia Dana M.D.   On: 05/12/2019 15:49    EKG:   Orders placed or performed during the hospital encounter of 04/14/2019  . EKG 12-Lead  . EKG 12-Lead  . EKG 12-Lead  . EKG 12-Lead    ASSESSMENT AND PLAN:   63 year old female patient with history of CVA, obstructive sleep apnea on CPAP, ESRD on hemodialysis Tuesday, Thursday, Saturday, congestive heart failure, COPD comes in because of worsening shortness of breath, she was called in the emergency room, had PEA, intubated, admitted to intensive care unit.  Acute hypoxic and hypercapneic respiratory failure- status post PEA arrest. -Extubated 6/2 and then had to be reintubated 6/5 -Vent management per CCM -Will need [place]-term trach- ENT consulted  Septic shock secondary to HCAP- has been hypotensive. -On pressors -Continue midodrine tid -Has completed course of abx  ESRD- on hemodialysis TTS. Temporary HD cath placed 6/5. -Nephrology following  Type 2 diabetes- blood sugars well controlled -Lantus and SSI  Hyperlipidemia- stable -Continue Lipitor  History of stroke -Continue Plavix  COPD- stable, no signs of acute exacerbation. -Continue nebs  Anemia in chronic kidney disease- hemoglobin low but stable -EPO per nephrology   All the records are reviewed and case discussed with Care Management/Social Workerr. Management plans discussed with the patient, family and they are in agreement.  CODE STATUS: full  TOTAL TIME TAKING CARE OF THIS PATIENT: 35 minutes.   Berna Spare Mayo M.D on 05/09/2019 at 1:31 PM  Between 7am to 6pm - Pager - [phone]  After 6pm go to [email or web address]  - password [email or web address] [facility]  Office  [phone]  CC: Primary care physician; Smithson, [name], MD   Note: This dictation was prepared with Dragon dictation along with smaller phrase technology. Any transcriptional errors that result from this process are unintentional.

## 2019-05-09 NOTE — Progress Notes (Signed)
ETT pulled back to 23 cm mark , per M.Tukov  NP , secured with commercial tube holder tolerated well without any incident  Sat 97% bbs rhochi hr 114 rr 16.

## 2019-05-09 NOTE — Progress Notes (Signed)
CRITICAL CARE NOTE  CC  follow up respiratory failure  SUBJECTIVE Patient remains critically ill Prognosis is guarded Full vent support Needs trach to survive   BP (!) 100/53 (BP Location: Right Wrist)   Pulse (!) 113   Temp 98.8 F (37.1 C) (Axillary)   Resp 13   Ht 5' 5.98" (1.676 m)   Wt (!) 140.7 kg   SpO2 96%   BMI 50.09 kg/m    I/O last 3 completed shifts: In: 1161.3 [I.V.:1111.4; IV Piggyback:49.9] Out: 1500 [Other:1500] No intake/output data recorded.  SpO2: 96 % O2 Flow Rate (L/min): 15 L/min FiO2 (%): 80 %  Vent Mode: PRVC FiO2 (%):  [60 %-100 %] 80 % Set Rate:  [16 bmp] 16 bmp Vt Set:  [500 mL] 500 mL PEEP:  [5 cmH20-10 cmH20] 5 cmH20 Plateau Pressure:  [24 cmH20-28 cmH20] 24 cmH20   SIGNIFICANT EVENTS 5/26 ICU admission for resp failure and cardiac arrest 5/26 hypothermia protocol 6/3 successfully extubated 6/5 re-admitted to the ICU for aspiration pneumonia and progressive diastolic heart failure and renal failure 6/6 plan for [facility], ENT consulted, remains critically ill on vent  REVIEW OF SYSTEMS  PATIENT IS UNABLE TO PROVIDE COMPLETE REVIEW OF SYSTEMS DUE TO SEVERE CRITICAL ILLNESS   PHYSICAL EXAMINATION:  GENERAL:critically ill appearing, +resp distress HEAD: Normocephalic, atraumatic.  EYES: Pupils equal, round, reactive to light.  No scleral icterus.  MOUTH: Moist mucosal membrane. NECK: Supple. No thyromegaly. No nodules. No JVD.  PULMONARY: +rhonchi, +wheezing CARDIOVASCULAR: S1 and S2. Regular rate and rhythm. No murmurs, rubs, or gallops.  GASTROINTESTINAL: Soft, nontender, -distended. No masses. Positive bowel sounds. No hepatosplenomegaly.  MUSCULOSKELETAL: No swelling, clubbing, or edema.  NEUROLOGIC: obtunded, GCS<8 SKIN:intact,warm,dry  MEDICATIONS: I have reviewed all medications and confirmed regimen as documented   CULTURE RESULTS   Recent Results (from the past 240 hour(s))  Culture, respiratory (non-expectorated)      Status: None   Collection Time: 05/01/19 11:05 AM  Result Value Ref Range Status   Specimen Description   Final    TRACHEAL ASPIRATE Performed at [facility], [address] [place], [place] [address]    Special Requests   Final    NONE Performed at [facility], [address] [place], [place] [address]    Gram Stain   Final    MODERATE WBC PRESENT, PREDOMINANTLY PMN RARE YEAST Performed at [facility], [place] [address], [place], [place] [address]    Culture FEW CANDIDA ALBICANS FEW CANDIDA TROPICALIS   Final   Report Status 05/04/2019 FINAL  Final          IMAGING    Dg Abd 1 View  Result Date: 05/16/2019 CLINICAL DATA:  OG tube placement. EXAM: ABDOMEN - 1 VIEW COMPARISON:  04/13/2019. FINDINGS: OG tube noted with tip below left hemidiaphragm. Severely distended loops of bowel are noted. Abdominal series suggested for further evaluation. No definite free air noted. IMPRESSION: NG tube noted with its tip under the left hemidiaphragm. Severely distended loops of bowel are noted. Abdominal series suggested for further evaluation. Electronically Signed   By: Marcello Moores  Register   On: 05/31/2019 15:50   Dg Chest Port 1 View  Result Date: 05/09/2019 CLINICAL DATA:  Acute respiratory failure EXAM: PORTABLE CHEST 1 VIEW COMPARISON:  05/10/2019 FINDINGS: Endotracheal tube tip is just above the level of the carina. Retraction by 2-3 cm would place it at the level of the clavicular heads. Enteric tube side port projects over the stomach.  Left IJ large bore central venous catheter is unchanged. Bibasilar atelectasis. IMPRESSION: Endotracheal tube tip just above the level of the carina. Retraction by 2-3 cm is recommended. Electronically Signed   By: Ulyses Jarred M.D.   On: 05/09/2019 06:21   Dg Chest Port 1 View  Result Date: 05/19/2019 CLINICAL DATA:  Intubation EXAM: PORTABLE CHEST 1 VIEW COMPARISON:  Portable exam 1454 hours compared to 05/07/2019 FINDINGS:  Tip of endotracheal tube projects 2.1 cm above carina. Nasogastric tube extends into abdomen. Rotated to the RIGHT. LEFT jugular large-bore central venous catheter with tip projecting over SVC. Enlargement of cardiac silhouette with pulmonary vascular congestion. Probable pulmonary edema and layered RIGHT pleural effusion IMPRESSION: Tip of endotracheal tube projects 2.1 cm above carina. Question pulmonary edema and layering RIGHT pleural effusion. Electronically Signed   By: Lavonia Dana M.D.   On: 05/10/2019 15:49    CBC    Component Value Date/Time   WBC 16.6 (H) 05/09/2019 0507   RBC 2.92 (L) 05/09/2019 0507   HGB 8.6 (L) 05/09/2019 0507   HGB 10.5 (L) 03/28/2015 0137   HCT 28.6 (L) 05/09/2019 0507   HCT 31.9 (L) 03/28/2015 0137   PLT 304 05/09/2019 0507   PLT 302 03/28/2015 0137   MCV 97.9 05/09/2019 0507   MCV 90 03/28/2015 0137   MCH 29.5 05/09/2019 0507   MCHC 30.1 05/09/2019 0507   RDW 18.6 (H) 05/09/2019 0507   RDW 14.9 (H) 03/28/2015 0137   LYMPHSABS 2.0 05/09/2019 0507   LYMPHSABS 1.7 03/24/2015 0531   MONOABS 1.3 (H) 05/09/2019 0507   MONOABS 1.2 (H) 03/24/2015 0531   EOSABS 0.2 05/09/2019 0507   EOSABS 0.0 03/24/2015 0531   BASOSABS 0.1 05/09/2019 0507   BASOSABS 0.2 (H) 03/24/2015 0531   BMP Latest Ref Rng & Units 05/09/2019 05/30/2019 05/06/2019  Glucose 70 - 99 mg/dL 161(H) 142(H) 139(H)  BUN 8 - 23 mg/dL 41(H) 51(H) 68(H)  Creatinine 0.44 - 1.00 mg/dL 6.20(H) 5.92(H) 5.41(H)  Sodium 135 - 145 mmol/L 138 139 136  Potassium 3.5 - 5.1 mmol/L 4.1 3.7 3.8  Chloride 98 - 111 mmol/L 100 99 100  CO2 22 - 32 mmol/L 24 23 22   Calcium 8.9 - 10.3 mg/dL 8.1(L) 8.5(L) 7.9(L)      Indwelling Urinary Catheter continued, requirement due to   Reason to continue Indwelling Urinary Catheter strict Intake/Output monitoring for hemodynamic instability   Central Line/ continued, requirement due to  Reason to continue [place] of central venous pressure or other  hemodynamic parameters and poor IV access   Ventilator continued, requirement due to severe respiratory failure   Ventilator Sedation RASS 0 to -2      ASSESSMENT AND PLAN SYNOPSIS  63 yo morbidly obese AAF with severe end stage renal disease s/p cardiac arrest and hypothermia protocol with recurrent resp failure from acute diastolic heart failure and aspiration pneumonia  Due to her underlying end stage diastolic;ic heart failure and end stage renal disease with recurrent resp failure and significant morbid obesity, she well need Trach for survival. Sister and Husband notified of clinical status.   Severe ACUTE Hypoxic and Hypercapnic Respiratory Failure -continue Full MV support -continue Bronchodilator Therapy -Wean Fio2 and PEEP as tolerated ENT consulted for trach  ACUTE DIASTOLIC CARDIAC FAILURE-  -oxygen as needed   CHRONIC KIDNEY INJURY/Renal Failure -follow chem 7 -follow UO -continue Foley Catheter-assess need -Avoid nephrotoxic agents -Recheck creatinine  HD as needed   NEUROLOGY - intubated and sedated -  minimal sedation to achieve a RASS goal: -1   CARDIAC ICU monitoring  ID -continue IV abx as prescibed -follow up cultures  GI GI PROPHYLAXIS as indicated  NUTRITIONAL STATUS DIET-->TF's as tolerated Constipation protocol as indicated  ENDO - will use ICU hypoglycemic\Hyperglycemia protocol if indicated   ELECTROLYTES -follow labs as needed -replace as needed -pharmacy consultation and following   DVT/GI PRX ordered TRANSFUSIONS AS NEEDED MONITOR FSBS ASSESS the need for LABS as needed   Critical Care Time devoted to patient care services described in this note is 34 minutes.   Overall, patient is critically ill, prognosis is guarded.  Patient with Multiorgan failure and at high risk for cardiac arrest and death.    Corrin Parker, M.D.  [facility]  Medical Director [place] Director [facility] Cardio-Pulmonary Department

## 2019-05-09 NOTE — Progress Notes (Signed)
Assisted family with video/camera time via elink

## 2019-05-09 NOTE — Progress Notes (Addendum)
Patient did not tolerate regular hemodialysis. Oxygen saturations fell to 78% with adjusted ventilator settings of FiO2 100% peep 10-12  respiratory rate increased to 20,tidal volume 500. Hemodialysis stopped per Dr. Candiss Norse. CRRT set up per orders  Of Dr. Candiss Norse. CRRT started at [place]. Oxygen saturations also dropped to 40%.  Propofol and Fentanyl stopped. RT paged and patient taken off ventilator and bagged at 100% Dr. Mortimer Fries also called and arrived momentarily.  Patient bagged with 100% oxygen. Patient paralyzed per order and bagged continually. Patient bagged and lavaged with sterile saline and suctioned  Family  called in to see patient..Copious amounts of yellow sputum retrieved per RT and sent for culture. 1845 Oxygen saturations starting to drop again. Dr Mortimer Fries in to see patient. 33 Husband in to see patient. Daughter enroute to see patient.

## 2019-05-09 NOTE — Progress Notes (Signed)
This note also relates to the following rows which could not be included: Pulse Rate - Cannot attach notes to unvalidated device data Resp - Cannot attach notes to unvalidated device data BP - Cannot attach notes to unvalidated device data SpO2 - Cannot attach notes to unvalidated device data End Tidal CO2 (EtCO2) - Cannot attach notes to unvalidated device data  O2 saturations/ vitals dropped once treatment was initiated. Blood returned. Tx on pause pending orders from Dr. Candiss Norse.    05/09/19 1700  Vital Signs  Pulse Rate Source Monitor  BP Location Right Wrist  Oxygen Therapy  O2 Device Ventilator  FiO2 (%) 100 %  During Hemodialysis Assessment  Intra-Hemodialysis Comments  (O2 saturation note)  Hemodialysis Catheter Right Femoral vein Double lumen Permanent  Placement Date/Time: 05/15/2019 1700   Placed prior to admission: No  Time Out: Correct patient;Correct site;Correct procedure  Maximum sterile barrier precautions: Hand hygiene;Sterile gloves;Cap;Large sterile sheet;Mask;Sterile gown  Site Prep: Chlorh...  Site Condition No complications  Purple Lumen Status Infusing  Dressing Type Biopatch  Dressing Status Clean;Dry;Intact  Dressing Change Due 05/15/19  Vent settings maxed out as per RN. Candiss Norse called with orders to discontinue tx.

## 2019-05-09 NOTE — Progress Notes (Signed)
Seq treatment start   05/09/19 1645  Vital Signs  Temp (!) 100.8 F (38.2 C)  Temp Source Axillary  Pulse Rate (!) 109  Pulse Rate Source Monitor  Resp 18  BP 129/61  BP Location Right Wrist  BP Method Automatic  Patient Position (if appropriate) Lying  Oxygen Therapy  SpO2 90 %  O2 Device Ventilator  End Tidal CO2 (EtCO2) 46  During Hemodialysis Assessment  Blood Flow Rate (mL/min) 300 mL/min  Arterial Pressure (mmHg) -140 mmHg  Venous Pressure (mmHg) 140 mmHg  Transmembrane Pressure (mmHg) 10 mmHg  Ultrafiltration Rate (mL/min) 800 mL/min  Dialysate Flow Rate (mL/min)  (seq)  Conductivity: Machine  14.2  HD Safety Checks Performed Yes  Dialysis Fluid Bolus Normal Saline  Bolus Amount (mL) 250 mL  Intra-Hemodialysis Comments Tx initiated (cvc care per policy. midodrine admin by RN)

## 2019-05-10 ENCOUNTER — Inpatient Hospital Stay: Payer: Medicare Other

## 2019-05-10 LAB — GLUCOSE, CAPILLARY
Glucose-Capillary: 142 mg/dL — ABNORMAL HIGH (ref 70–99)
Glucose-Capillary: 178 mg/dL — ABNORMAL HIGH (ref 70–99)
Glucose-Capillary: 197 mg/dL — ABNORMAL HIGH (ref 70–99)
Glucose-Capillary: 201 mg/dL — ABNORMAL HIGH (ref 70–99)
Glucose-Capillary: 220 mg/dL — ABNORMAL HIGH (ref 70–99)
Glucose-Capillary: 223 mg/dL — ABNORMAL HIGH (ref 70–99)

## 2019-05-10 LAB — RENAL FUNCTION PANEL
Albumin: 2.6 g/dL — ABNORMAL LOW (ref 3.5–5.0)
Albumin: 2.6 g/dL — ABNORMAL LOW (ref 3.5–5.0)
Albumin: 2.5 g/dL — ABNORMAL LOW (ref 3.5–5.0)
Albumin: 2.5 g/dL — ABNORMAL LOW (ref 3.5–5.0)
Anion gap: 14 (ref 5–15)
Anion gap: 14 (ref 5–15)
Anion gap: 16 — ABNORMAL HIGH (ref 5–15)
Anion gap: 13 (ref 5–15)
BUN: 39 mg/dL — ABNORMAL HIGH (ref 8–23)
BUN: 38 mg/dL — ABNORMAL HIGH (ref 8–23)
BUN: 40 mg/dL — ABNORMAL HIGH (ref 8–23)
BUN: 35 mg/dL — ABNORMAL HIGH (ref 8–23)
CO2: 22 mmol/L (ref 22–32)
CO2: 23 mmol/L (ref 22–32)
CO2: 20 mmol/L — ABNORMAL LOW (ref 22–32)
CO2: 20 mmol/L — ABNORMAL LOW (ref 22–32)
Calcium: 8.1 mg/dL — ABNORMAL LOW (ref 8.9–10.3)
Calcium: 8.3 mg/dL — ABNORMAL LOW (ref 8.9–10.3)
Calcium: 7.7 mg/dL — ABNORMAL LOW (ref 8.9–10.3)
Calcium: 7.6 mg/dL — ABNORMAL LOW (ref 8.9–10.3)
Chloride: 100 mmol/L (ref 98–111)
Chloride: 101 mmol/L (ref 98–111)
Chloride: 100 mmol/L (ref 98–111)
Chloride: 102 mmol/L (ref 98–111)
Creatinine, Ser: 6.27 mg/dL — ABNORMAL HIGH (ref 0.44–1.00)
Creatinine, Ser: 5.66 mg/dL — ABNORMAL HIGH (ref 0.44–1.00)
Creatinine, Ser: 5.96 mg/dL — ABNORMAL HIGH (ref 0.44–1.00)
Creatinine, Ser: 4.79 mg/dL — ABNORMAL HIGH (ref 0.44–1.00)
GFR calc Af Amer: 7 mL/min — ABNORMAL LOW (ref >60)
GFR calc Af Amer: 8 mL/min — ABNORMAL LOW (ref >60)
GFR calc Af Amer: 8 mL/min — ABNORMAL LOW (ref >60)
GFR calc Af Amer: 10 mL/min — ABNORMAL LOW (ref >60)
GFR calc non Af Amer: 6 mL/min — ABNORMAL LOW (ref >60)
GFR calc non Af Amer: 7 mL/min — ABNORMAL LOW (ref >60)
GFR calc non Af Amer: 7 mL/min — ABNORMAL LOW (ref >60)
GFR calc non Af Amer: 9 mL/min — ABNORMAL LOW (ref >60)
Glucose, Bld: 180 mg/dL — ABNORMAL HIGH (ref 70–99)
Glucose, Bld: 199 mg/dL — ABNORMAL HIGH (ref 70–99)
Glucose, Bld: 252 mg/dL — ABNORMAL HIGH (ref 70–99)
Glucose, Bld: 215 mg/dL — ABNORMAL HIGH (ref 70–99)
Phosphorus: 6 mg/dL — ABNORMAL HIGH (ref 2.5–4.6)
Phosphorus: 6.1 mg/dL — ABNORMAL HIGH (ref 2.5–4.6)
Phosphorus: 5.9 mg/dL — ABNORMAL HIGH (ref 2.5–4.6)
Phosphorus: 4.3 mg/dL (ref 2.5–4.6)
Potassium: 4.9 mmol/L (ref 3.5–5.1)
Potassium: 5.2 mmol/L — ABNORMAL HIGH (ref 3.5–5.1)
Potassium: 4.7 mmol/L (ref 3.5–5.1)
Potassium: 3.4 mmol/L — ABNORMAL LOW (ref 3.5–5.1)
Sodium: 136 mmol/L (ref 135–145)
Sodium: 138 mmol/L (ref 135–145)
Sodium: 136 mmol/L (ref 135–145)
Sodium: 135 mmol/L (ref 135–145)

## 2019-05-10 LAB — BLOOD GAS, ARTERIAL
Acid-base deficit: 9.3 mmol/L — ABNORMAL HIGH (ref 0.0–2.0)
Bicarbonate: 21.4 mmol/L (ref 20.0–28.0)
FIO2: 1
MECHVT: 550 mL
O2 Saturation: 98.8 %
PEEP: 12 cmH2O
Patient temperature: 37
RATE: 12 resp/min
pCO2 arterial: 69 mmHg (ref 32.0–48.0)
pH, Arterial: 7.1 — CL (ref 7.350–7.450)
pO2, Arterial: 159 mmHg — ABNORMAL HIGH (ref 83.0–108.0)

## 2019-05-10 LAB — BASIC METABOLIC PANEL
Anion gap: 14 (ref 5–15)
BUN: 39 mg/dL — ABNORMAL HIGH (ref 8–23)
CO2: 20 mmol/L — ABNORMAL LOW (ref 22–32)
Calcium: 7.8 mg/dL — ABNORMAL LOW (ref 8.9–10.3)
Chloride: 101 mmol/L (ref 98–111)
Creatinine, Ser: 5.97 mg/dL — ABNORMAL HIGH (ref 0.44–1.00)
GFR calc Af Amer: 8 mL/min — ABNORMAL LOW (ref >60)
GFR calc non Af Amer: 7 mL/min — ABNORMAL LOW (ref >60)
Glucose, Bld: 252 mg/dL — ABNORMAL HIGH (ref 70–99)
Potassium: 4.7 mmol/L (ref 3.5–5.1)
Sodium: 135 mmol/L (ref 135–145)

## 2019-05-10 LAB — CBC
HCT: 30.5 % — ABNORMAL LOW (ref 36.0–46.0)
Hemoglobin: 9 g/dL — ABNORMAL LOW (ref 12.0–15.0)
MCH: 29.3 pg (ref 26.0–34.0)
MCHC: 29.5 g/dL — ABNORMAL LOW (ref 30.0–36.0)
MCV: 99.3 fL (ref 80.0–100.0)
Platelets: 255 10*3/uL (ref 150–400)
RBC: 3.07 MIL/uL — ABNORMAL LOW (ref 3.87–5.11)
RDW: 18.4 % — ABNORMAL HIGH (ref 11.5–15.5)
WBC: 32 10*3/uL — ABNORMAL HIGH (ref 4.0–10.5)
nRBC: 0.6 % — ABNORMAL HIGH (ref 0.0–0.2)

## 2019-05-10 LAB — APTT: aPTT: 38 seconds — ABNORMAL HIGH (ref 24–36)

## 2019-05-10 LAB — MAGNESIUM
Magnesium: 2 mg/dL (ref 1.7–2.4)
Magnesium: 2 mg/dL (ref 1.7–2.4)

## 2019-05-10 MED ORDER — STERILE WATER FOR INJECTION IJ SOLN
INTRAMUSCULAR | Status: AC
  Administered 2019-05-10: 10 mL
  Filled 2019-05-10: qty 10

## 2019-05-10 MED ORDER — FAMOTIDINE IN NACL 20-0.9 MG/50ML-% IV SOLN: 20.0000 mg | Freq: Two times a day (BID) | INTRAVENOUS | Status: DC

## 2019-05-10 MED ORDER — PUREFLOW DIALYSIS SOLUTION
INTRAVENOUS | Status: DC
  Administered 2019-05-10: 10:00:00 via INTRAVENOUS_CENTRAL

## 2019-05-10 MED ORDER — DEXMEDETOMIDINE HCL IN NACL 400 MCG/100ML IV SOLN
0.4000 ug/kg/h | INTRAVENOUS | Status: DC
  Administered 2019-05-10 (×2): 1 ug/kg/h via INTRAVENOUS
  Administered 2019-05-10: 0.4 ug/kg/h via INTRAVENOUS
  Administered 2019-05-10 – 2019-05-11 (×2): 1 ug/kg/h via INTRAVENOUS
  Administered 2019-05-11: 13:00:00 0.8 ug/kg/h via INTRAVENOUS
  Administered 2019-05-11: 1 ug/kg/h via INTRAVENOUS
  Administered 2019-05-11: 0.8 ug/kg/h via INTRAVENOUS
  Administered 2019-05-11: 1 ug/kg/h via INTRAVENOUS
  Administered 2019-05-11 (×2): 0.8 ug/kg/h via INTRAVENOUS
  Administered 2019-05-12: 0.569 ug/kg/h via INTRAVENOUS
  Administered 2019-05-12: 0.8 ug/kg/h via INTRAVENOUS
  Administered 2019-05-12: 0.6 ug/kg/h via INTRAVENOUS
  Administered 2019-05-12: 0.9 ug/kg/h via INTRAVENOUS
  Administered 2019-05-12: 0.8 ug/kg/h via INTRAVENOUS
  Administered 2019-05-12: 0.6 ug/kg/h via INTRAVENOUS
  Administered 2019-05-12: 0.9 ug/kg/h via INTRAVENOUS
  Administered 2019-05-13 (×7): 0.8 ug/kg/h via INTRAVENOUS
  Administered 2019-05-14: 0.6 ug/kg/h via INTRAVENOUS
  Administered 2019-05-14: 0.8 ug/kg/h via INTRAVENOUS
  Filled 2019-05-10 (×26): qty 100

## 2019-05-10 MED ORDER — ALTEPLASE 2 MG IJ SOLR
2.0000 mg | Freq: Once | INTRAMUSCULAR | Status: AC
  Administered 2019-05-10: 08:00:00 2 mg
  Filled 2019-05-10: qty 2

## 2019-05-10 MED ORDER — PUREFLOW DIALYSIS SOLUTION
INTRAVENOUS | Status: DC
  Administered 2019-05-10: 1500 mL via INTRAVENOUS_CENTRAL
  Administered 2019-05-11 – 2019-05-13 (×3): via INTRAVENOUS_CENTRAL

## 2019-05-10 MED ORDER — HEPARIN (PORCINE) IN NACL 2-0.9 UNITS/ML
INTRAMUSCULAR | Status: DC
  Administered 2019-05-10: 10:00:00 via INTRAVENOUS
  Filled 2019-05-10 (×6): qty 500
  Filled 2019-05-10: qty 1000

## 2019-05-10 MED ORDER — STERILE WATER FOR INJECTION IJ SOLN
INTRAMUSCULAR | Status: AC
  Administered 2019-05-10: 08:00:00
  Filled 2019-05-10: qty 10

## 2019-05-10 MED ORDER — FAMOTIDINE IN NACL 20-0.9 MG/50ML-% IV SOLN
20.0000 mg | Freq: Every day | INTRAVENOUS | Status: DC
  Administered 2019-05-11 – 2019-05-13 (×3): 20 mg via INTRAVENOUS
  Filled 2019-05-10 (×3): qty 50

## 2019-05-10 NOTE — Progress Notes (Signed)
[facility]  ROUNDING NOTE   Subjective:   Patient become unstable with hemodialysis yesterday.  Got only a few minutes of treatment. She was then switched to CRRT.  Overnight about 1300 cc of fluid was removed.  Venous line has clotted and currently CRRT is on hold for TPA administration Patient remains critically ill.  Requiring 100% FiO2 to maintain oxygen saturation Ventilator dependent Also requiring pressors for hemodynamic support Response to pain per nursing assessment earlier  Objective:  Vital signs in last 24 hours:  Temp:  [97.6 F (36.4 C)-100.8 F (38.2 C)] 97.6 F (36.4 C) (06/07 0800) Pulse Rate:  [71-121] 90 (06/07 1000) Resp:  [0-22] 18 (06/07 1000) BP: (72-159)/(38-93) 125/93 (06/07 1000) SpO2:  [78 %-100 %] 96 % (06/07 1000) FiO2 (%):  [60 %-100 %] 100 % (06/07 0758) Weight:  [140.4 kg-142.5 kg] 140.6 kg (06/07 1000)  Weight change: 1.8 kg Filed Weights   05/09/19 1645 05/10/19 0337 05/10/19 1000  Weight: (!) 142.5 kg (!) 140.4 kg (!) 140.6 kg    Intake/Output: I/O last 3 completed shifts: In: 2174.6 [P.O.:120; I.V.:2032.5; IV Piggyback:22.2] Out: 2862 [Other:2862]   Intake/Output this shift:  Total I/O In: 11.3 [I.V.:11.3] Out: -   Physical Exam: General: Chronically ill appearing  HEENT  ET tube, OG tube  Neck:  no masses  Lungs:   Ventilator assisted, difficult exam due to body habitus  Heart: Regular rate and rhythm, tachycardic  Abdomen:  Soft, nontender, obese  Extremities:  + peripheral edema.  Neurologic:  Sedated  Skin:  warm  Access: +thrombosed left arm AVG, rt fem temp cath placed 6/5    Basic Metabolic Panel: Recent Labs  Lab 05/04/19 0436  05/06/19 0437 06/01/2019 0445 05/09/19 0507 05/10/19 0055 05/10/19 0520  NA 136   < > 136 139 138 136 138  K 3.7   < > 3.8 3.7 4.1 4.9 5.2*  CL 102   < > 100 99 100 100 101  CO2 25   < > 22 23 24 22 23   GLUCOSE 222*   < > 139* 142* 161* 180* 199*  BUN 75*   < > 68* 51*  41* 39* 38*  CREATININE 4.90*   < > 5.41* 5.92* 6.20* 6.27* 5.66*  CALCIUM 7.9*   < > 7.9* 8.5* 8.1* 8.1* 8.3*  MG 2.0  --   --   --   --   --  2.0  PHOS  --   --  4.2 3.5  --  6.0* 6.1*   < > = values in this interval not displayed.    Liver Function Tests: Recent Labs  Lab 05/06/19 0437 05/11/2019 0445 05/10/19 0055 05/10/19 0520  ALBUMIN 2.8* 3.0* 2.6* 2.6*   No results for input(s): LIPASE, AMYLASE in the last 168 hours. No results for input(s): AMMONIA in the last 168 hours.  CBC: Recent Labs  Lab 05/05/19 0458 05/06/19 0437 05/09/19 0507 05/10/19 0520  WBC 11.4* 10.9* 16.6* 32.0*  NEUTROABS 7.9*  --  12.8*  --   HGB 8.5* 8.7* 8.6* 9.0*  HCT 26.7* 27.6* 28.6* 30.5*  MCV 92.4 93.2 97.9 99.3  PLT 243 259 304 255    Cardiac Enzymes: Recent Labs  Lab 05/06/19 0437  TROPONINI 0.03*    BNP: Invalid input(s): POCBNP  CBG: Recent Labs  Lab 05/09/19 1601 05/09/19 1931 05/09/19 2347 05/10/19 0314 05/10/19 0828  GLUCAP 147* 101* 122* 142* 178*    Microbiology: Results for orders placed or performed  during the hospital encounter of 04/27/2019  SARS Coronavirus 2 (CEPHEID - Performed in [facility] hospital lab), Hosp Order     Status: None   Collection Time: 05/03/2019  7:31 AM  Result Value Ref Range Status   SARS Coronavirus 2 NEGATIVE NEGATIVE Final    Comment: (NOTE) If result is NEGATIVE SARS-CoV-2 target nucleic acids are NOT DETECTED. The SARS-CoV-2 RNA is generally detectable in upper and lower  respiratory specimens during the acute phase of infection. The lowest  concentration of SARS-CoV-2 viral copies this assay can detect is 250  copies / mL. A negative result does not preclude SARS-CoV-2 infection  and should not be used as the sole basis for treatment or other  patient management decisions.  A negative result may occur with  improper specimen collection / handling, submission of specimen other  than nasopharyngeal swab, presence of viral  mutation(s) within the  areas targeted by this assay, and inadequate number of viral copies  (<250 copies / mL). A negative result must be combined with clinical  observations, patient history, and epidemiological information. If result is POSITIVE SARS-CoV-2 target nucleic acids are DETECTED. The SARS-CoV-2 RNA is generally detectable in upper and lower  respiratory specimens dur ing the acute phase of infection.  Positive  results are indicative of active infection with SARS-CoV-2.  Clinical  correlation with patient history and other diagnostic information is  necessary to determine patient infection status.  Positive results do  not rule out bacterial infection or co-infection with other viruses. If result is PRESUMPTIVE POSTIVE SARS-CoV-2 nucleic acids MAY BE PRESENT.   A presumptive positive result was obtained on the submitted specimen  and confirmed on repeat testing.  While 2019 novel coronavirus  (SARS-CoV-2) nucleic acids may be present in the submitted sample  additional confirmatory testing may be necessary for epidemiological  and / or clinical management purposes  to differentiate between  SARS-CoV-2 and other Sarbecovirus currently known to infect humans.  If clinically indicated additional testing with an alternate test  methodology [phone]) is advised. The SARS-CoV-2 RNA is generally  detectable in upper and lower respiratory sp ecimens during the acute  phase of infection. The expected result is Negative. Fact Sheet for Patients:  StrictlyIdeas.no Fact Sheet for Healthcare Providers: BankingDealers.co.za This test is not yet approved or cleared by the [place] FDA and has been authorized for detection and/or diagnosis of SARS-CoV-2 by FDA under an Emergency Use Authorization (EUA).  This EUA will remain in effect (meaning this test can be used) for the duration of the COVID-19 declaration under Section 564(b)(1)  of the Act, 21 U.S.C. section 360bbb-3(b)(1), unless the authorization is terminated or revoked sooner. Performed at [facility], [address] [place], [place] [address]   MRSA PCR Screening     Status: None   Collection Time: 04/22/2019  9:04 AM  Result Value Ref Range Status   MRSA by PCR NEGATIVE NEGATIVE Final    Comment:        The GeneXpert MRSA Assay (FDA approved for NASAL specimens only), is one component of a comprehensive MRSA colonization surveillance program. It is not intended to diagnose MRSA infection nor to guide or monitor treatment for MRSA infections. Performed at [facility], [address] [place], [place] [address]   Culture, respiratory (non-expectorated)     Status: None   Collection Time: 05/01/19 11:05 AM  Result Value Ref Range Status   Specimen Description   Final    TRACHEAL  ASPIRATE Performed at [facility], [address] [place], [place] [address]    Special Requests   Final    NONE Performed at [facility], [place], [place] [address]    Gram Stain   Final    MODERATE WBC PRESENT, PREDOMINANTLY PMN RARE YEAST Performed at [facility] [address], [place], [place] [address]    Culture FEW CANDIDA ALBICANS FEW CANDIDA TROPICALIS   Final   Report Status 05/04/2019 FINAL  Final  Culture, respiratory     Status: None (Preliminary result)   Collection Time: 05/09/19  6:30 PM  Result Value Ref Range Status   Specimen Description   Final    INDUCED SPUTUM Performed at [facility], [address], [place], [place] [address]    Special Requests   Final    NONE Performed at [facility], [address] [place], [place] [address]    Gram Stain   Final    MODERATE WBC PRESENT, PREDOMINANTLY PMN FEW GRAM NEGATIVE RODS Performed at [facility], [place] [address], [place], [place] [address]    Culture PENDING  Incomplete   Report Status  PENDING  Incomplete    Coagulation Studies: No results for input(s): LABPROT, INR in the last 72 hours.  Urinalysis: No results for input(s): COLORURINE, LABSPEC, PHURINE, GLUCOSEU, HGBUR, BILIRUBINUR, KETONESUR, PROTEINUR, UROBILINOGEN, NITRITE, LEUKOCYTESUR in the last 72 hours.  Invalid input(s): APPERANCEUR    Imaging: Dg Abd 1 View  Result Date: 05/28/2019 CLINICAL DATA:  OG tube placement. EXAM: ABDOMEN - 1 VIEW COMPARISON:  04/21/2019. FINDINGS: OG tube noted with tip below left hemidiaphragm. Severely distended loops of bowel are noted. Abdominal series suggested for further evaluation. No definite free air noted. IMPRESSION: NG tube noted with its tip under the left hemidiaphragm. Severely distended loops of bowel are noted. Abdominal series suggested for further evaluation. Electronically Signed   By: Marcello Moores  Register   On: 05/06/2019 15:50   Dg Chest Port 1 View  Result Date: 05/10/2019 CLINICAL DATA:  Acute respiratory failure. EXAM: PORTABLE CHEST 1 VIEW COMPARISON:  Chest radiograph 05/09/2019 FINDINGS: ET tube terminates in the mid trachea. Enteric tube courses inferior to the diaphragm. Central venous catheter tip projects over the superior vena cava. Stable cardiomegaly. Elevation right hemidiaphragm. Similar-appearing bilateral mid lower lung heterogeneous opacities. Probable small left pleural effusion. IMPRESSION: Similar-appearing mid and lower lung airspace opacities. Possible small left pleural effusion. Electronically Signed   By: Lovey Newcomer M.D.   On: 05/10/2019 06:47   Dg Chest Port 1 View  Result Date: 05/09/2019 CLINICAL DATA:  Congestive heart failure.  Hypoxia.  Ex-smoker. EXAM: PORTABLE CHEST 1 VIEW COMPARISON:  05/09/2019 at 0552 hours. FINDINGS: 1817 hours. Large bore left-sided central line terminates at the high SVC. Endotracheal tube is difficult to visualize centrally but likely terminates 2.9 cm above carina. Nasogastric tube extends beyond the inferior  aspect of the film. Mildly degraded exam due to AP portable technique and patient body habitus. Normal heart size for level of inspiration. Right costophrenic angle minimally excluded. moderate right hemidiaphragm elevation with extremely low lung volumes. No overt congestive heart failure. Suspect residual right base airspace disease. IMPRESSION: Cardiomegaly and extremely low lung volumes. Probable residual right base airspace disease/atelectasis. Electronically Signed   By: Abigail Miyamoto M.D.   On: 05/09/2019 18:59   Dg Chest Port 1 View  Result Date: 05/09/2019 CLINICAL DATA:  Acute respiratory failure EXAM: PORTABLE CHEST 1  VIEW COMPARISON:  05/05/2019 FINDINGS: Endotracheal tube tip is just above the level of the carina. Retraction by 2-3 cm would place it at the level of the clavicular heads. Enteric tube side port projects over the stomach. Left IJ large bore central venous catheter is unchanged. Bibasilar atelectasis. IMPRESSION: Endotracheal tube tip just above the level of the carina. Retraction by 2-3 cm is recommended. Electronically Signed   By: Ulyses Jarred M.D.   On: 05/09/2019 06:21   Dg Chest Port 1 View  Result Date: 05/28/2019 CLINICAL DATA:  Intubation EXAM: PORTABLE CHEST 1 VIEW COMPARISON:  Portable exam 1454 hours compared to 05/07/2019 FINDINGS: Tip of endotracheal tube projects 2.1 cm above carina. Nasogastric tube extends into abdomen. Rotated to the RIGHT. LEFT jugular large-bore central venous catheter with tip projecting over SVC. Enlargement of cardiac silhouette with pulmonary vascular congestion. Probable pulmonary edema and layered RIGHT pleural effusion IMPRESSION: Tip of endotracheal tube projects 2.1 cm above carina. Question pulmonary edema and layering RIGHT pleural effusion. Electronically Signed   By: Lavonia Dana M.D.   On: 05/19/2019 15:49     Medications:   . sodium chloride Stopped (05/07/2019 1724)  . albumin human    . fentaNYL infusion INTRAVENOUS Stopped  (05/09/19 1808)  . heparin 10 mL/hr at 05/10/19 1020  . norepinephrine (LEVOPHED) Adult infusion 12 mcg/min (05/10/19 0800)  . piperacillin-tazobactam (ZOSYN)  IV 12.5 mL/hr at 05/10/19 0600  . propofol (DIPRIVAN) infusion Stopped (05/09/19 1808)  . pureflow 1,500 mL/hr at 05/10/19 1027  . vasopressin (PITRESSIN) infusion - *FOR SHOCK* 0.03 Units/min (05/10/19 1019)   . allopurinol  150 mg Per Tube Once per day on Tue Thu Sat  . amitriptyline  25 mg Per Tube QHS  . atorvastatin  40 mg Per Tube Daily  . budesonide (PULMICORT) nebulizer solution  0.5 mg Nebulization BID  . Chlorhexidine Gluconate Cloth  6 each Topical Daily  . citalopram  20 mg Per Tube Daily  . clopidogrel  75 mg Per Tube Daily  . epoetin (EPOGEN/PROCRIT) injection  10,000 Units Intravenous Q T,Th,Sa-HD  . famotidine  20 mg Per Tube Daily  . heparin injection (subcutaneous)  5,000 Units Subcutaneous Q8H  . hydrocortisone sod succinate (SOLU-CORTEF) inj  50 mg Intravenous Q6H  . insulin aspart  0-9 Units Subcutaneous Q4H  . insulin aspart  3 Units Subcutaneous Q4H  . insulin glargine  5 Units Subcutaneous QHS  . ipratropium-albuterol  3 mL Nebulization Q4H  . mouth rinse  15 mL Mouth Rinse 10 times per day  . midodrine  10 mg Per Tube TID WC  . multivitamin  15 mL Per Tube Daily  . senna-docusate  1 tablet Per Tube BID  . sodium chloride flush  10-40 mL Intracatheter Q12H   albuterol, fentaNYL, fentaNYL (SUBLIMAZE) injection, heparin, lidocaine-prilocaine, LORazepam, midazolam, nystatin, polyethylene glycol, polyvinyl alcohol, sodium chloride flush, vecuronium  Assessment/ Plan:  Ms. [name] is a 63 y.o. black female with end stage renal disease on hemodialysis, hypertension, CVA, obstructive sleep apnea, peripheral vascular disease, hypertension, gout, GERD, diabetes mellitus type II, COPD, coronary artery disease, congestive heart failure, who  was admitted to [facility] on 05/03/2019 for pneumonia. Acute  respiratory failure requiring mechanical ventilation and vasopressors.   TTS [facility] left AVG   1. End stage renal disease  With generalized edema/volume overload Complication of dialysis device, clotted AVG.  Started on CRRT June 6 Ultrafiltration rate of 150 cc/h Monitor electrolytes and adjust dialysate accordingly  2. Anemia of chronic kidney disease:  Lab Results  Component Value Date   HGB 9.0 (L) 05/10/2019   - EPO with HD treatment TTS  3. Hypotension:  - requiring pressors  4. Acute respiratory failure with cardiopulmonary arrest. Extubated 6/2, re-intubated 6/5 FiO2 100% Volume removal as tolerated with CRRT Nursing staff report thick secretions are obtained with suctioning    LOS: 12 Harmeet Singh 6/7/202011:06 AM

## 2019-05-10 NOTE — Progress Notes (Signed)
0800 After mutiple tried CRRT stopped. Declotting procedure started. Cath flo to both venous and arterial ports. Venous port declotted for 2nd time. Patient more awake and withdraws to pain. Some movement noted when ABG drawn. Purposeful movement noted in arms bilaterally. Husband and daughter called- updated on patient. Dr. Mortimer Fries updated.

## 2019-05-10 NOTE — Progress Notes (Addendum)
Patient remains critically ill. Unresponsive at first of shift but more responsive as day progressed. Reiterated with family that patient is on life support but more stable than yesterday.Precedex and Fentanyl added to sedated patient for CRRT to progress. Vasopressin d/ced when systolic BP became greater than 180. Levophed is still infusing at 15 mcg/min and weaning. FiO2 on ventilator weaned to 80%. Patient is tolerating changes well. 1600 assessment patient followed very basic commands.Spontaneous movement of hands and feet as well as a startle reflex noted with no stimulation. CRRT running since 1218 without issues. Facial edema especially orbital and sub orbital have  increased today. Patient grimaces with all mouth care.

## 2019-05-10 NOTE — Consult Note (Signed)
Pharmacy Antibiotic Note  [name] is a 63 y.o. female admitted on 04/26/2019 with pneumonia.  Pharmacy has been consulted for Zosyn dosing. Patient is receiving CRRT.  Plan: Zosyn 3.375g IV q8h (4 hour infusion).   Height: 5' 5.98" (167.6 cm) Weight: (!) 309 lb 15.5 oz (140.6 kg) IBW/kg (Calculated) : 59.26  Temp (24hrs), Avg:99.1 F (37.3 C), Min:97.6 F (36.4 C), Max:100.8 F (38.2 C)  Recent Labs  Lab 05/05/19 0458 05/06/19 0437 05/11/2019 0445 05/09/19 0507 05/10/19 0055 05/10/19 0520 05/10/19 1305  WBC 11.4* 10.9*  --  16.6*  --  32.0*  --   CREATININE 6.17* 5.41* 5.92* 6.20* 6.27* 5.66* 5.96*    Estimated Creatinine Clearance: 13.8 mL/min (A) (by C-G formula based on SCr of 5.96 mg/dL (H)).    Allergies  Allergen Reactions  . Iodine Rash  . Enalapril Other (See Comments)    TONGUE SWELLS/GOUT  . Iodinated Diagnostic Agents Other (See Comments)    BREAK OUT IN WHELPS      Antimicrobials this admission: Zosyn 6/6 >>   Thank you for allowing pharmacy to be a part of this patient's care.  Merrill,Kristin A 05/10/2019 2:31 PM

## 2019-05-10 NOTE — Progress Notes (Signed)
CRITICAL CARE NOTE  CC  follow up respiratory failure  SUBJECTIVE Patient remains critically ill Prognosis is guarded Near cardiac arrest Full vent support Needs trach to survive multiple vasopressors +aspiration pneumonia   BP (!) 120/49   Pulse 87   Temp 98.7 F (37.1 C) (Axillary)   Resp 12   Ht 5' 5.98" (1.676 m)   Wt (!) 140.4 kg   SpO2 98%   BMI 49.98 kg/m    I/O last 3 completed shifts: In: 2174.6 [P.O.:120; I.V.:2032.5; IV Piggyback:22.2] Out: 2862 [Other:2862] No intake/output data recorded.  SpO2: 98 % O2 Flow Rate (L/min): 15 L/min FiO2 (%): 100 %  Vent Mode: PRVC FiO2 (%):  [60 %-100 %] 100 % Set Rate:  [12 bmp-16 bmp] 12 bmp Vt Set:  [500 mL-550 mL] 550 mL PEEP:  [5 cmH20-12 cmH20] 12 cmH20 Plateau Pressure:  [18 AOZ30-86 cmH20] 18 cmH20  SIGNIFICANT EVENTS 5/26 ICU admission for resp failure and cardiac arrest 5/26 hypothermia protocol 6/3 successfully extubated 6/5 re-admitted to the ICU for aspiration pneumonia and progressive diastolic heart failure and renal failure 6/6 plan for [facility], ENT consulted, remains critically ill on vent 6/6 near cardiac arrest, fio2 100%, PEEP 15  REVIEW OF SYSTEMS  PATIENT IS UNABLE TO PROVIDE COMPLETE REVIEW OF SYSTEMS DUE TO SEVERE CRITICAL ILLNESS   PHYSICAL EXAMINATION:  GENERAL:critically ill appearing, +resp distress HEAD: Normocephalic, atraumatic.  EYES: Pupils equal, round, reactive to light.  No scleral icterus.  MOUTH: Moist mucosal membrane. NECK: Supple. No thyromegaly. No nodules. No JVD.  PULMONARY: +rhonchi, +wheezing CARDIOVASCULAR: S1 and S2. Regular rate and rhythm. No murmurs, rubs, or gallops.  GASTROINTESTINAL: Soft, nontender, -distended. No masses. Positive bowel sounds. No hepatosplenomegaly.  MUSCULOSKELETAL: No swelling, clubbing, or edema.  NEUROLOGIC: obtunded, GCS<8 SKIN:intact,warm,dry  MEDICATIONS: I have reviewed all medications and confirmed regimen as  documented   CULTURE RESULTS   Recent Results (from the past 240 hour(s))  Culture, respiratory (non-expectorated)     Status: None   Collection Time: 05/01/19 11:05 AM  Result Value Ref Range Status   Specimen Description   Final    TRACHEAL ASPIRATE Performed at [facility], [address], [place], [place] [address]    Special Requests   Final    NONE Performed at [facility], [address] [place], [place] [address]    Gram Stain   Final    MODERATE WBC PRESENT, PREDOMINANTLY PMN RARE YEAST Performed at [facility], [place] [address], [place], [place] [address]    Culture FEW CANDIDA ALBICANS FEW CANDIDA TROPICALIS   Final   Report Status 05/04/2019 FINAL  Final  Culture, respiratory     Status: None (Preliminary result)   Collection Time: 05/09/19  6:30 PM  Result Value Ref Range Status   Specimen Description   Final    INDUCED SPUTUM Performed at [facility], [address] [place], [place] [address]    Special Requests   Final    NONE Performed at [facility], [address] [place], [place] [address]    Gram Stain   Final    MODERATE WBC PRESENT, PREDOMINANTLY PMN FEW GRAM NEGATIVE RODS Performed at [facility], [place] [address] [place], [place] [address]    Culture PENDING  Incomplete   Report Status PENDING  Incomplete          IMAGING    Dg Chest Port 1 View  Result Date: 05/10/2019 CLINICAL DATA:  Acute  respiratory failure. EXAM: PORTABLE CHEST 1 VIEW COMPARISON:  Chest radiograph 05/09/2019 FINDINGS: ET tube terminates in the mid trachea. Enteric tube courses inferior to the diaphragm. Central venous catheter tip projects over the superior vena cava. Stable cardiomegaly. Elevation right hemidiaphragm. Similar-appearing bilateral mid lower lung heterogeneous opacities. Probable small left pleural effusion. IMPRESSION: Similar-appearing mid and lower lung airspace opacities. Possible small left  pleural effusion. Electronically Signed   By: Lovey Newcomer M.D.   On: 05/10/2019 06:47   Dg Chest Port 1 View  Result Date: 05/09/2019 CLINICAL DATA:  Congestive heart failure.  Hypoxia.  Ex-smoker. EXAM: PORTABLE CHEST 1 VIEW COMPARISON:  05/09/2019 at 0552 hours. FINDINGS: 1817 hours. Large bore left-sided central line terminates at the high SVC. Endotracheal tube is difficult to visualize centrally but likely terminates 2.9 cm above carina. Nasogastric tube extends beyond the inferior aspect of the film. Mildly degraded exam due to AP portable technique and patient body habitus. Normal heart size for level of inspiration. Right costophrenic angle minimally excluded. moderate right hemidiaphragm elevation with extremely low lung volumes. No overt congestive heart failure. Suspect residual right base airspace disease. IMPRESSION: Cardiomegaly and extremely low lung volumes. Probable residual right base airspace disease/atelectasis. Electronically Signed   By: Abigail Miyamoto M.D.   On: 05/09/2019 18:59   CBC    Component Value Date/Time   WBC 32.0 (H) 05/10/2019 0520   RBC 3.07 (L) 05/10/2019 0520   HGB 9.0 (L) 05/10/2019 0520   HGB 10.5 (L) 03/28/2015 0137   HCT 30.5 (L) 05/10/2019 0520   HCT 31.9 (L) 03/28/2015 0137   PLT 255 05/10/2019 0520   PLT 302 03/28/2015 0137   MCV 99.3 05/10/2019 0520   MCV 90 03/28/2015 0137   MCH 29.3 05/10/2019 0520   MCHC 29.5 (L) 05/10/2019 0520   RDW 18.4 (H) 05/10/2019 0520   RDW 14.9 (H) 03/28/2015 0137   LYMPHSABS 2.0 05/09/2019 0507   LYMPHSABS 1.7 03/24/2015 0531   MONOABS 1.3 (H) 05/09/2019 0507   MONOABS 1.2 (H) 03/24/2015 0531   EOSABS 0.2 05/09/2019 0507   EOSABS 0.0 03/24/2015 0531   BASOSABS 0.1 05/09/2019 0507   BASOSABS 0.2 (H) 03/24/2015 0531   BMP Latest Ref Rng & Units 05/10/2019 05/10/2019 05/09/2019  Glucose 70 - 99 mg/dL 199(H) 180(H) 161(H)  BUN 8 - 23 mg/dL 38(H) 39(H) 41(H)  Creatinine 0.44 - 1.00 mg/dL 5.66(H) 6.27(H) 6.20(H)  Sodium  135 - 145 mmol/L 138 136 138  Potassium 3.5 - 5.1 mmol/L 5.2(H) 4.9 4.1  Chloride 98 - 111 mmol/L 101 100 100  CO2 22 - 32 mmol/L 23 22 24   Calcium 8.9 - 10.3 mg/dL 8.3(L) 8.1(L) 8.1(L)       Indwelling Urinary Catheter continued, requirement due to   Reason to continue Indwelling Urinary Catheter strict Intake/Output monitoring for hemodynamic instability   Central Line/ continued, requirement due to  Reason to continue [place] of central venous pressure or other hemodynamic parameters and poor IV access   Ventilator continued, requirement due to severe respiratory failure   Ventilator Sedation RASS 0 to -2      ASSESSMENT AND PLAN SYNOPSIS  63 yo morbidly obese AAF with severe end stage renal disease s/p cardiac arrest and hypothermia protocol with recurrent resp failure from acute diastolic heart failure and aspiration pneumonia  Due to her underlying end stage diastolic;ic heart failure and end stage renal disease with recurrent resp failure and significant morbid obesity, she well need Trach for survival. Sister  and Husband notified of clinical status.   Severe ACUTE Hypoxic and Hypercapnic Respiratory Failure -continue Full MV support -continue Bronchodilator Therapy -Wean Fio2 and PEEP as tolerated ENT consulted for trach   ASPIRATION PNEUMONIA FOLLOW UP CULTURES On ZOSYN  ACUTE  DIASTOLIC CARDIAC FAILURE -oxygen as needed -follow up cardiac enzymes as indicated    Chronic KIDNEY INJURY/Renal Failure -follow chem 7 -follow UO -continue Foley Catheter-assess need -Avoid nephrotoxic agents -Recheck creatinine  On CRRT   NEUROLOGY - intubated and sedated - minimal sedation to achieve a RASS goal: -1 Wake up assessment pending   SHOCK-SEPSIS/HYPOVOLUMIC -use vasopressors to keep MAP>65 -follow ABG and LA -follow up cultures -emperic ABX -consider stress dose steroids -aggressive IV fluid resuscitation  CARDIAC ICU  monitoring  ID -continue IV abx as prescibed -follow up cultures  GI GI PROPHYLAXIS as indicated  NUTRITIONAL STATUS DIET-->TF's as tolerated Constipation protocol as indicated  ENDO - will use ICU hypoglycemic\Hyperglycemia protocol if indicated   ELECTROLYTES -follow labs as needed -replace as needed -pharmacy consultation and following   DVT/GI PRX ordered TRANSFUSIONS AS NEEDED MONITOR FSBS ASSESS the need for LABS as needed   Critical Care Time devoted to patient care services described in this note is 32 minutes.   Overall, patient is critically ill, prognosis is guarded.  Patient with Multiorgan failure and at high risk for cardiac arrest and death.   Poor prognosis palliative care consultation Recommend DNR status  Kurian Patricia Pesa, M.D.  [facility]  Medical Director [place] Director [facility] Cardio-Pulmonary Department

## 2019-05-10 NOTE — Progress Notes (Signed)
[facility] [facility] [place] NAME: [name]    MR#:  [id number]  DATE OF BIRTH:  09-27-1956     Remains intubated this morning.  Requiring full vent support.  Per nursing, patient had a near cardiac arrest last night.  Still requiring multiple pressors.  CRRT started yesterday.   REVIEW OF SYSTEMS:   Unable to obtain due to patient being intubated and sedated  DRUG ALLERGIES:   Allergies  Allergen Reactions  . Iodine Rash  . Enalapril Other (See Comments)    TONGUE SWELLS/GOUT  . Iodinated Diagnostic Agents Other (See Comments)    BREAK OUT IN WHELPS      VITALS:  Blood pressure (!) 139/56, pulse 94, temperature 97.6 F (36.4 C), temperature source Oral, resp. rate 18, height 5' 5.98" (1.676 m), weight (!) 140.6 kg, SpO2 90 %.  PHYSICAL EXAMINATION:  GENERAL:  63 y.o.-morbidly obese critically ill-appearing EYES: Pupils equal, round, reactive to light  No scleral icterus.  HEENT: Head atraumatic, normocephalic.  Currently intubated, sedated. ETT in place. NECK:  Supple, no jugular venous distention. No thyroid enlargement, no tenderness.  LUNGS: +diffuse rhonchi present CARDIOVASCULAR: RRR, S1, S2 normal. No murmurs, rubs, or gallops.  ABDOMEN: Soft, nontender, nondistended. Bowel sounds present. No organomegaly or mass.  EXTREMITIES: +pedal edema, no cyanosis or clubbing.  NEUROLOGIC: Unable to do neuro exam because of intubation, sedation. PSYCHIATRIC: Intubated, sedated.   SKIN: No obvious rash, lesion, or ulcer.    LABORATORY PANEL:   CBC Recent Labs  Lab 05/10/19 0520  WBC 32.0*  HGB 9.0*  HCT 30.5*  PLT 255   ------------------------------------------------------------------------------------------------------------------  Chemistries  Recent Labs  Lab 05/10/19 0520 05/10/19 1305  NA 138 136  K 5.2* 4.7  CL 101 100  CO2 23 20*  GLUCOSE 199* 252*  BUN 38* 40*  CREATININE 5.66* 5.96*  CALCIUM  8.3* 7.7*  MG 2.0  --    ------------------------------------------------------------------------------------------------------------------  Cardiac Enzymes Recent Labs  Lab 05/06/19 0437  TROPONINI 0.03*   ------------------------------------------------------------------------------------------------------------------  RADIOLOGY:  Dg Abd 1 View  Result Date: 05/27/2019 CLINICAL DATA:  OG tube placement. EXAM: ABDOMEN - 1 VIEW COMPARISON:  04/07/2019. FINDINGS: OG tube noted with tip below left hemidiaphragm. Severely distended loops of bowel are noted. Abdominal series suggested for further evaluation. No definite free air noted. IMPRESSION: NG tube noted with its tip under the left hemidiaphragm. Severely distended loops of bowel are noted. Abdominal series suggested for further evaluation. Electronically Signed   By: Marcello Moores  Register   On: 06/01/2019 15:50   Dg Chest Port 1 View  Result Date: 05/10/2019 CLINICAL DATA:  Acute respiratory failure. EXAM: PORTABLE CHEST 1 VIEW COMPARISON:  Chest radiograph 05/09/2019 FINDINGS: ET tube terminates in the mid trachea. Enteric tube courses inferior to the diaphragm. Central venous catheter tip projects over the superior vena cava. Stable cardiomegaly. Elevation right hemidiaphragm. Similar-appearing bilateral mid lower lung heterogeneous opacities. Probable small left pleural effusion. IMPRESSION: Similar-appearing mid and lower lung airspace opacities. Possible small left pleural effusion. Electronically Signed   By: Lovey Newcomer M.D.   On: 05/10/2019 06:47   Dg Chest Port 1 View  Result Date: 05/09/2019 CLINICAL DATA:  Congestive heart failure.  Hypoxia.  Ex-smoker. EXAM: PORTABLE CHEST 1 VIEW COMPARISON:  05/09/2019 at 0552 hours. FINDINGS: 1817 hours. Large bore left-sided central line terminates at the high SVC. Endotracheal tube is difficult to visualize centrally but likely terminates 2.9 cm above carina. Nasogastric tube extends  beyond the  inferior aspect of the film. Mildly degraded exam due to AP portable technique and patient body habitus. Normal heart size for level of inspiration. Right costophrenic angle minimally excluded. moderate right hemidiaphragm elevation with extremely low lung volumes. No overt congestive heart failure. Suspect residual right base airspace disease. IMPRESSION: Cardiomegaly and extremely low lung volumes. Probable residual right base airspace disease/atelectasis. Electronically Signed   By: Abigail Miyamoto M.D.   On: 05/09/2019 18:59   Dg Chest Port 1 View  Result Date: 05/09/2019 CLINICAL DATA:  Acute respiratory failure EXAM: PORTABLE CHEST 1 VIEW COMPARISON:  05/15/2019 FINDINGS: Endotracheal tube tip is just above the level of the carina. Retraction by 2-3 cm would place it at the level of the clavicular heads. Enteric tube side port projects over the stomach. Left IJ large bore central venous catheter is unchanged. Bibasilar atelectasis. IMPRESSION: Endotracheal tube tip just above the level of the carina. Retraction by 2-3 cm is recommended. Electronically Signed   By: Ulyses Jarred M.D.   On: 05/09/2019 06:21   Dg Chest Port 1 View  Result Date: 05/16/2019 CLINICAL DATA:  Intubation EXAM: PORTABLE CHEST 1 VIEW COMPARISON:  Portable exam 1454 hours compared to 05/07/2019 FINDINGS: Tip of endotracheal tube projects 2.1 cm above carina. Nasogastric tube extends into abdomen. Rotated to the RIGHT. LEFT jugular large-bore central venous catheter with tip projecting over SVC. Enlargement of cardiac silhouette with pulmonary vascular congestion. Probable pulmonary edema and layered RIGHT pleural effusion IMPRESSION: Tip of endotracheal tube projects 2.1 cm above carina. Question pulmonary edema and layering RIGHT pleural effusion. Electronically Signed   By: Lavonia Dana M.D.   On: 05/21/2019 15:49    EKG:   Orders placed or performed during the hospital encounter of 04/10/2019  . EKG 12-Lead  . EKG 12-Lead  .  EKG 12-Lead  . EKG 12-Lead    ASSESSMENT AND PLAN:   63 year old female patient with history of CVA, obstructive sleep apnea on CPAP, ESRD on hemodialysis Tuesday, Thursday, Saturday, congestive heart failure, COPD comes in because of worsening shortness of breath, she was called in the emergency room, had PEA, intubated, admitted to intensive care unit.  Acute hypoxic and hypercapneic respiratory failure- status post PEA arrest. -Extubated 6/2 and then had to be reintubated 6/5 -Vent management per CCM -ENT to place trach early next week  Septic shock secondary to HCAP- has been hypotensive. -On multiple pressors -Continue midodrine tid -Has completed course of abx  ESRD- on hemodialysis TTS. Temporary HD cath placed 6/5. -Nephrology following -CRRT started 6/6.  Type 2 diabetes- blood sugars well controlled -Lantus and SSI  Hyperlipidemia- stable -Continue Lipitor  History of stroke -Continue Plavix  COPD- stable, no signs of acute exacerbation. -Continue nebs  Anemia in chronic kidney disease- hemoglobin low but stable -EPO per nephrology   All the records are reviewed and case discussed with Care Management/Social Workerr. Management plans discussed with the patient, family and they are in agreement.  CODE STATUS: full  TOTAL TIME TAKING CARE OF THIS PATIENT: 33 minutes.   Berna Spare  M.D on 05/10/2019 at 2:24 PM  Between 7am to 6pm - Pager [phone]  After 6pm go to [email or web address] - password [email or web address] [facility]  Office  [phone]  CC: Primary care physician; Smithson, [name], MD   Note: This dictation was prepared with Dragon dictation along with smaller phrase technology. Any transcriptional errors that result from this process are unintentional.

## 2019-05-10 NOTE — Progress Notes (Addendum)
CRRT finally restarted. B/P dropped - Propofol decreased to 78mcg/kg/min. and Levophed increased to 71mcg/min.

## 2019-05-10 NOTE — Progress Notes (Signed)
Assisted family with camera/video time via elink 

## 2019-05-11 ENCOUNTER — Inpatient Hospital Stay: Admission: EM | Disposition: E | Payer: Self-pay | Source: Home / Self Care | Attending: Internal Medicine

## 2019-05-11 DIAGNOSIS — T829XXA Unspecified complication of cardiac and vascular prosthetic device, implant and graft, initial encounter: Secondary | ICD-10-CM

## 2019-05-11 DIAGNOSIS — N189 Chronic kidney disease, unspecified: Secondary | ICD-10-CM

## 2019-05-11 DIAGNOSIS — N170 Acute kidney failure with tubular necrosis: Secondary | ICD-10-CM

## 2019-05-11 DIAGNOSIS — I5030 Unspecified diastolic (congestive) heart failure: Secondary | ICD-10-CM

## 2019-05-11 DIAGNOSIS — J9621 Acute and chronic respiratory failure with hypoxia: Secondary | ICD-10-CM

## 2019-05-11 DIAGNOSIS — I959 Hypotension, unspecified: Secondary | ICD-10-CM

## 2019-05-11 DIAGNOSIS — N186 End stage renal disease: Secondary | ICD-10-CM

## 2019-05-11 DIAGNOSIS — J9690 Respiratory failure, unspecified, unspecified whether with hypoxia or hypercapnia: Secondary | ICD-10-CM

## 2019-05-11 LAB — RENAL FUNCTION PANEL
Albumin: 2.4 g/dL — ABNORMAL LOW (ref 3.5–5.0)
Albumin: 2.4 g/dL — ABNORMAL LOW (ref 3.5–5.0)
Albumin: 2.6 g/dL — ABNORMAL LOW (ref 3.5–5.0)
Anion gap: 11 (ref 5–15)
Anion gap: 13 (ref 5–15)
Anion gap: 14 (ref 5–15)
BUN: 34 mg/dL — ABNORMAL HIGH (ref 8–23)
BUN: 34 mg/dL — ABNORMAL HIGH (ref 8–23)
BUN: 41 mg/dL — ABNORMAL HIGH (ref 8–23)
CO2: 21 mmol/L — ABNORMAL LOW (ref 22–32)
CO2: 22 mmol/L (ref 22–32)
CO2: 25 mmol/L (ref 22–32)
Calcium: 7.8 mg/dL — ABNORMAL LOW (ref 8.9–10.3)
Calcium: 8.1 mg/dL — ABNORMAL LOW (ref 8.9–10.3)
Calcium: 8.4 mg/dL — ABNORMAL LOW (ref 8.9–10.3)
Chloride: 101 mmol/L (ref 98–111)
Chloride: 101 mmol/L (ref 98–111)
Chloride: 101 mmol/L (ref 98–111)
Creatinine, Ser: 4.35 mg/dL — ABNORMAL HIGH (ref 0.44–1.00)
Creatinine, Ser: 4.43 mg/dL — ABNORMAL HIGH (ref 0.44–1.00)
Creatinine, Ser: 4.87 mg/dL — ABNORMAL HIGH (ref 0.44–1.00)
GFR calc Af Amer: 10 mL/min — ABNORMAL LOW (ref >60)
GFR calc Af Amer: 11 mL/min — ABNORMAL LOW (ref >60)
GFR calc Af Amer: 12 mL/min — ABNORMAL LOW
GFR calc non Af Amer: 10 mL/min — ABNORMAL LOW
GFR calc non Af Amer: 10 mL/min — ABNORMAL LOW (ref >60)
GFR calc non Af Amer: 9 mL/min — ABNORMAL LOW (ref >60)
Glucose, Bld: 179 mg/dL — ABNORMAL HIGH (ref 70–99)
Glucose, Bld: 186 mg/dL — ABNORMAL HIGH (ref 70–99)
Glucose, Bld: 188 mg/dL — ABNORMAL HIGH (ref 70–99)
Phosphorus: 3.7 mg/dL (ref 2.5–4.6)
Phosphorus: 4 mg/dL (ref 2.5–4.6)
Phosphorus: 4.2 mg/dL (ref 2.5–4.6)
Potassium: 3.8 mmol/L (ref 3.5–5.1)
Potassium: 3.8 mmol/L (ref 3.5–5.1)
Potassium: 3.9 mmol/L (ref 3.5–5.1)
Sodium: 135 mmol/L (ref 135–145)
Sodium: 137 mmol/L (ref 135–145)
Sodium: 137 mmol/L (ref 135–145)

## 2019-05-11 LAB — GLUCOSE, CAPILLARY
Glucose-Capillary: 160 mg/dL — ABNORMAL HIGH (ref 70–99)
Glucose-Capillary: 158 mg/dL — ABNORMAL HIGH (ref 70–99)
Glucose-Capillary: 168 mg/dL — ABNORMAL HIGH (ref 70–99)
Glucose-Capillary: 192 mg/dL — ABNORMAL HIGH (ref 70–99)
Glucose-Capillary: 190 mg/dL — ABNORMAL HIGH (ref 70–99)

## 2019-05-11 LAB — CBC
HCT: 25.3 % — ABNORMAL LOW (ref 36.0–46.0)
Hemoglobin: 7.9 g/dL — ABNORMAL LOW (ref 12.0–15.0)
MCH: 29.9 pg (ref 26.0–34.0)
MCHC: 31.2 g/dL (ref 30.0–36.0)
MCV: 95.8 fL (ref 80.0–100.0)
Platelets: 175 10*3/uL (ref 150–400)
RBC: 2.64 MIL/uL — ABNORMAL LOW (ref 3.87–5.11)
RDW: 18.2 % — ABNORMAL HIGH (ref 11.5–15.5)
WBC: 14.7 10*3/uL — ABNORMAL HIGH (ref 4.0–10.5)
nRBC: 2.8 % — ABNORMAL HIGH (ref 0.0–0.2)

## 2019-05-11 LAB — APTT: aPTT: 36 s (ref 24–36)

## 2019-05-11 LAB — MAGNESIUM: Magnesium: 1.9 mg/dL (ref 1.7–2.4)

## 2019-05-11 SURGERY — TEMPORARY DIALYSIS CATHETER
Anesthesia: Moderate Sedation | Laterality: Right

## 2019-05-11 MED ORDER — CLOPIDOGREL BISULFATE 75 MG PO TABS
75.0000 mg | ORAL_TABLET | Freq: Every day | ORAL | Status: DC
  Administered 2019-05-12 – 2019-05-20 (×5): 75 mg
  Filled 2019-05-11 (×5): qty 1

## 2019-05-11 NOTE — Consult Note (Signed)
Pharmacy Antibiotic Note  [name] is a 63 y.o. female admitted on 04/21/2019 with pneumonia.  Pharmacy has been consulted for Zosyn dosing. Patient is receiving CRRT. Antibiotics were stopped 6/2 and aspiration PNA is suspected following re-intubation on 6/5, when Zosyn was started. This is day #3 of Zosyn. Leukocytosis has improved but WBC remains elevated. She is due to be evaluated by vascular surgery for new HD access.  Plan: Continue Zosyn 3.375g IV q8h (4 hour infusion).   Height: 5' 5.98" (167.6 cm) Weight: (!) 303 lb 12.7 oz (137.8 kg) IBW/kg (Calculated) : 59.26  Temp (24hrs), Avg:98.4 F (36.9 C), Min:97.7 F (36.5 C), Max:99.8 F (37.7 C)  Recent Labs  Lab 05/05/19 0458 05/06/19 0437  05/09/19 0507  05/10/19 0520 05/10/19 1305 05/10/19 1508 05/10/19 2000 05/10/19 2357 05/27/2019 0414  WBC 11.4* 10.9*  --  16.6*  --  32.0*  --   --   --   --  14.7*  CREATININE 6.17* 5.41*   < > 6.20*   < > 5.66* 5.96* 5.97* 4.79* 4.43* 4.35*   < > = values in this interval not displayed.    Estimated Creatinine Clearance: 18.7 mL/min (A) (by C-G formula based on SCr of 4.35 mg/dL (H)).    Antimicrobials this admission:  Zosyn 6/6>> Vanc 5/26 >> 5/27 Cefepime 5/26 >> 5/29 Azith 5/29 >> 6/2 Doxy 5/29 >> 6/2  Microbiology results:  5/26 MRSA PCR: neg 5/29 RCx few C albicans, C tropicalis  Thank you for allowing pharmacy to be a part of this patient's care.  Dallie Piles, PharmD 05/10/2019 1:59 PM

## 2019-05-11 NOTE — Op Note (Signed)
  OPERATIVE NOTE   PROCEDURE: 1. Removal and replacement of a right femoral trial assist type dialysis catheter through the same venous access  PRE-OPERATIVE DIAGNOSIS: 1.  ESRD, hypotension requiring pressors, respiratory failure, nonfunctional right femoral temporary catheter  POST-OPERATIVE DIAGNOSIS: Same  SURGEON: Leotis Pain, MD  ASSISTANT(S): Hezzie Bump, PA-C  ANESTHESIA: local  ESTIMATED BLOOD LOSS: 5 cc  FINDING(S): 1. None  SPECIMEN(S): None  INDICATIONS:  Patient is a 63 y.o.female who presents with renal failure, clotted arm graft that has never been able to be opened, and now recurrent hypotension requiring pressors and respiratory failure.  She has a temporary dialysis catheter which is not functioning and needs to be replaced.  Risks and benefits were discussed, and informed consent was obtained.  An assistant was necessary during the procedure to retract her pannus which was massive.  DESCRIPTION: After obtaining full informed written consent, the patient was laid flat in the bed. The assistant retracted her pannus throughout the entire procedure which would have been impossible to keep sterile without pannus retraction.  Her existing catheter was also prepped.  An Amplatz Super Stiff wire was used to keep venous access and the existing catheter was removed.  A 30 cm trial assist type dialysis catheter was then used over the wire and the wire was removed.  The lumens withdrew dark red nonpulsatile blood and flushed easily with sterile saline. The catheter was secured to the skin with 2 nylon sutures. Sterile dressing was placed.  COMPLICATIONS: None  CONDITION: Stable  Leotis Pain 05/12/2019 1:57 PM  This note was created with Dragon Medical transcription system. Any errors in dictation are purely unintentional.

## 2019-05-11 NOTE — Progress Notes (Signed)
CRITICAL CARE NOTE  CC  follow up respiratory failure  SUBJECTIVE Patient remains critically ill Prognosis is guarded Multiorgan failure Multiple vasopressors vasc catheter issues and clotting +aspiration pneumonia   BP (!) 155/67   Pulse 89   Temp 97.9 F (36.6 C) (Axillary)   Resp 18   Ht 5' 5.98" (1.676 m)   Wt (!) 137.8 kg   SpO2 95%   BMI 49.06 kg/m    I/O last 3 completed shifts: In: 1924.9 [P.O.:120; I.V.:1732.7; IV Piggyback:72.2] Out: 2072 [Other:2072] Total I/O In: -  Out: 959 [Other:959]  SpO2: 95 % O2 Flow Rate (L/min): 15 L/min FiO2 (%): 55 %  Vent Mode: PRVC FiO2 (%):  [55 %-100 %] 55 % Set Rate:  [12 bmp-18 bmp] 18 bmp Vt Set:  [550 mL-580 mL] 580 mL PEEP:  [15 cmH20] 15 cmH20 Plateau Pressure:  [27 cmH20-28 cmH20] 27 cmH20  SIGNIFICANT EVENTS 5/26 ICU admission for resp failure and cardiac arrest 5/26 hypothermia protocol 6/3 successfully extubated 6/5 re-admitted to the ICU for aspiration pneumonia and progressive diastolic heart failure and renal failure 6/6 plan for [place], ENT consulted, remains critically ill on vent 6/6 near cardiac arrest, fio2 100%, PEEP 15 6/7 severe resp failure, multiorgan failure, husband and daughter updated  REVIEW OF SYSTEMS  PATIENT IS UNABLE TO PROVIDE COMPLETE REVIEW OF SYSTEMS DUE TO SEVERE CRITICAL ILLNESS   PHYSICAL EXAMINATION:  GENERAL:critically ill appearing, +resp distress HEAD: Normocephalic, atraumatic.  EYES: Pupils equal, round, reactive to light.  No scleral icterus.  MOUTH: Moist mucosal membrane. NECK: Supple. No thyromegaly. No nodules. No JVD.  PULMONARY: +rhonchi, +wheezing CARDIOVASCULAR: S1 and S2. Regular rate and rhythm. No murmurs, rubs, or gallops.  GASTROINTESTINAL: Soft, nontender, -distended. No masses. Positive bowel sounds.  MUSCULOSKELETAL: No swelling, clubbing, or edema.  NEUROLOGIC: obtunded, GCS<8 SKIN:intact,warm,dry  MEDICATIONS: I have reviewed all medications  and confirmed regimen as documented   CULTURE RESULTS   Recent Results (from the past 240 hour(s))  Culture, respiratory (non-expectorated)     Status: None   Collection Time: 05/01/19 11:05 AM  Result Value Ref Range Status   Specimen Description   Final    TRACHEAL ASPIRATE Performed at [facility], [address] [place], [place] [address]    Special Requests   Final    NONE Performed at [facility], [address] [place], [place] [address]    Gram Stain   Final    MODERATE WBC PRESENT, PREDOMINANTLY PMN RARE YEAST Performed at [facility], [place] [address], [place], [place] [address]    Culture FEW CANDIDA ALBICANS FEW CANDIDA TROPICALIS   Final   Report Status 05/04/2019 FINAL  Final  Culture, respiratory     Status: None (Preliminary result)   Collection Time: 05/09/19  6:30 PM  Result Value Ref Range Status   Specimen Description   Final    INDUCED SPUTUM Performed at [facility], [address], [place], [place] [address]    Special Requests   Final    NONE Performed at [facility], [address] [place], [place] [address]    Gram Stain   Final    MODERATE WBC PRESENT, PREDOMINANTLY PMN FEW GRAM NEGATIVE RODS Performed at [facility], [place] [address], [place], [place] [address]    Culture PENDING  Incomplete   Report Status PENDING  Incomplete   CBC    Component Value Date/Time   WBC 14.7 (H) 05/17/2019 0414  RBC 2.64 (L) 05/17/2019 0414   HGB 7.9 (L) 05/06/2019 0414   HGB 10.5 (L) 03/28/2015 0137   HCT 25.3 (L) 05/13/2019 0414   HCT 31.9 (L) 03/28/2015 0137   PLT 175 05/10/2019 0414   PLT 302 03/28/2015 0137   MCV 95.8 05/16/2019 0414   MCV 90 03/28/2015 0137   MCH 29.9 05/25/2019 0414   MCHC 31.2 05/05/2019 0414   RDW 18.2 (H) 06/02/2019 0414   RDW 14.9 (H) 03/28/2015 0137   LYMPHSABS 2.0 05/09/2019 0507   LYMPHSABS 1.7 03/24/2015 0531   MONOABS 1.3 (H) 05/09/2019 0507   MONOABS 1.2  (H) 03/24/2015 0531   EOSABS 0.2 05/09/2019 0507   EOSABS 0.0 03/24/2015 0531   BASOSABS 0.1 05/09/2019 0507   BASOSABS 0.2 (H) 03/24/2015 0531   BMP Latest Ref Rng & Units 05/18/2019 05/10/2019 05/10/2019  Glucose 70 - 99 mg/dL 179(H) 188(H) 215(H)  BUN 8 - 23 mg/dL 34(H) 34(H) 35(H)  Creatinine 0.44 - 1.00 mg/dL 4.35(H) 4.43(H) 4.79(H)  Sodium 135 - 145 mmol/L 137 137 135  Potassium 3.5 - 5.1 mmol/L 3.8 3.8 3.4(L)  Chloride 98 - 111 mmol/L 101 101 102  CO2 22 - 32 mmol/L 25 22 20(L)  Calcium 8.9 - 10.3 mg/dL 8.1(L) 8.4(L) 7.6(L)       Indwelling Urinary Catheter continued, requirement due to   Reason to continue Indwelling Urinary Catheter strict Intake/Output monitoring for hemodynamic instability   Central Line/ continued, requirement due to  Reason to continue [place] of central venous pressure or other hemodynamic parameters and poor IV access   Ventilator continued, requirement due to severe respiratory failure   Ventilator Sedation RASS 0 to -2      ASSESSMENT AND PLAN SYNOPSIS 63 yo morbidly obese AAF with severe end stage renal disease s/p cardiac arrest and hypothermia protocol with recurrent resp failure from acute diastolic heart failure and aspiration pneumonia  Due to her underlying end stage diastolic;ic heart failure and end stage renal disease with recurrent resp failure and significant morbid obesity, she well need Trach for survival. Sister and Husband notified of clinical status.  Severe ACUTE Hypoxic and Hypercapnic Respiratory Failure -continue Full MV support -continue Bronchodilator Therapy -Wean Fio2 and PEEP as tolerated UNABLE TO WEAN ENT CONSULTED FOR TRACH  ASPIRATION PNEUMONIA CULTURES PENDING On ZOSYN  ACUTE DIASTOLIC CARDIAC FAILURE- -oxygen as needed Vent support  CHRONIC KIDNEY INJURY/Renal Failure -follow chem 7 -follow UO -continue Foley Catheter-assess need -Avoid nephrotoxic agents -Recheck creatinine  vasc  cath issues, plan for HD   NEUROLOGY - intubated and sedated - minimal sedation to achieve a RASS goal: -1    SHOCK-SEPSIS/HYPOVOLUMIC/CARDIOGENIC -use vasopressors to keep MAP>65 -follow ABG and LA -follow up cultures -emperic ABX -stress dose steroids   CARDIAC ICU monitoring  ID -continue IV abx as prescibed -follow up cultures  GI GI PROPHYLAXIS as indicated  NUTRITIONAL STATUS DIET-->TF's as tolerated Constipation protocol as indicated  ENDO - will use ICU hypoglycemic\Hyperglycemia protocol if indicated   ELECTROLYTES -follow labs as needed -replace as needed -pharmacy consultation and following   DVT/GI PRX ordered TRANSFUSIONS AS NEEDED MONITOR FSBS ASSESS the need for LABS as needed   Critical Care Time devoted to patient care services described in this note is 32 minutes.   Overall, patient is critically ill, prognosis is guarded.  Patient with Multiorgan failure and at high risk for cardiac arrest and death.   Poor prognosis Follow up palliative care consult Family has been updated on  a daily basis  Corrin Parker, M.D.  [facility]  Medical Director [place] Director [place] Department

## 2019-05-11 NOTE — Progress Notes (Signed)
[place] [facility] [place] NAME: [name]    MR#:  [id number]  DATE OF BIRTH:  [birth year]     Patient remains critically ill and intubated with multiple organ failure.  Still requiring a lot of pressors.  No significant events overnight.   REVIEW OF SYSTEMS:   Unable to obtain due to patient being intubated and sedated  DRUG ALLERGIES:   Allergies  Allergen Reactions  . Iodine Rash  . Enalapril Other (See Comments)    TONGUE SWELLS/GOUT  . Iodinated Diagnostic Agents Other (See Comments)    BREAK OUT IN WHELPS      VITALS:  Blood pressure (!) 123/57, pulse 92, temperature 98.6 F (37 C), temperature source (S) Other (Comment), resp. rate 18, height 5' 5.98" (1.676 m), weight (!) 137.8 kg, SpO2 92 %.  PHYSICAL EXAMINATION:  GENERAL:  63 y.o.-morbidly obese and critically ill-appearing. EYES: Pupils equal, round, reactive to light  No scleral icterus.  HEENT: Head atraumatic, normocephalic.  Currently intubated, sedated. ETT in place. NECK:  Supple, no jugular venous distention. No thyroid enlargement, no tenderness.  LUNGS: +diffuse rhonchi and wheezing present CARDIOVASCULAR: RRR, S1, S2 normal. No murmurs, rubs, or gallops.  ABDOMEN: Soft, nontender, nondistended. Bowel sounds present. No organomegaly or mass.  EXTREMITIES: +pedal edema, no cyanosis or clubbing.  NEUROLOGIC: Unable to do neuro exam because of intubation, sedation. PSYCHIATRIC: Intubated, sedated.   SKIN: No obvious rash, lesion, or ulcer.   LABORATORY PANEL:   CBC Recent Labs  Lab 05/13/2019 0414  WBC 14.7*  HGB 7.9*  HCT 25.3*  PLT 175   ------------------------------------------------------------------------------------------------------------------  Chemistries  Recent Labs  Lab 05/28/2019 0414  NA 137  K 3.8  CL 101  CO2 25  GLUCOSE 179*  BUN 34*  CREATININE 4.35*  CALCIUM 8.1*  MG 1.9    ------------------------------------------------------------------------------------------------------------------  Cardiac Enzymes Recent Labs  Lab 05/06/19 0437  TROPONINI 0.03*   ------------------------------------------------------------------------------------------------------------------  RADIOLOGY:  Dg Chest Port 1 View  Result Date: 05/10/2019 CLINICAL DATA:  Acute respiratory failure. EXAM: PORTABLE CHEST 1 VIEW COMPARISON:  Chest radiograph 05/09/2019 FINDINGS: ET tube terminates in the mid trachea. Enteric tube courses inferior to the diaphragm. Central venous catheter tip projects over the superior vena cava. Stable cardiomegaly. Elevation right hemidiaphragm. Similar-appearing bilateral mid lower lung heterogeneous opacities. Probable small left pleural effusion. IMPRESSION: Similar-appearing mid and lower lung airspace opacities. Possible small left pleural effusion. Electronically Signed   By: Lovey Newcomer M.D.   On: 05/10/2019 06:47   Dg Chest Port 1 View  Result Date: 05/09/2019 CLINICAL DATA:  Congestive heart failure.  Hypoxia.  Ex-smoker. EXAM: PORTABLE CHEST 1 VIEW COMPARISON:  05/09/2019 at 0552 hours. FINDINGS: 1817 hours. Large bore left-sided central line terminates at the high SVC. Endotracheal tube is difficult to visualize centrally but likely terminates 2.9 cm above carina. Nasogastric tube extends beyond the inferior aspect of the film. Mildly degraded exam due to AP portable technique and patient body habitus. Normal heart size for level of inspiration. Right costophrenic angle minimally excluded. moderate right hemidiaphragm elevation with extremely low lung volumes. No overt congestive heart failure. Suspect residual right base airspace disease. IMPRESSION: Cardiomegaly and extremely low lung volumes. Probable residual right base airspace disease/atelectasis. Electronically Signed   By: Abigail Miyamoto M.D.   On: 05/09/2019 18:59    EKG:   Orders placed or  performed during the hospital encounter of 04/17/2019  . EKG 12-Lead  . EKG 12-Lead  .  EKG 12-Lead  . EKG 12-Lead    ASSESSMENT AND PLAN:   63 year old female patient with history of CVA, obstructive sleep apnea on CPAP, ESRD on hemodialysis Tuesday, Thursday, Saturday, congestive heart failure, COPD comes in because of worsening shortness of breath, she was called in the emergency room, had PEA, intubated, admitted to intensive care unit.  Acute hypoxic and hypercapneic respiratory failure- status post PEA arrest. Now with new aspiration pneumonia -Extubated 6/2 and then had to be reintubated 6/5 -Vent management per CCM -ENT to place trach early this week -Started on Zosyn -Blood and sputum cultures pending  Shock- multi-factorial due to sepsis/hypovolemia/cardiogenic -On multiple pressors -Continue midodrine tid -Abx as above  ESRD- on hemodialysis TTS. Temporary HD cath placed 6/5. -Nephrology following -CRRT started 6/6.  Type 2 diabetes- blood sugars well controlled -Lantus and SSI  Hyperlipidemia- stable -Continue Lipitor  History of stroke -Continue Plavix  COPD- stable, no signs of acute exacerbation. -Continue nebs  Anemia in chronic kidney disease- hemoglobin low but stable -EPO per nephrology   All the records are reviewed and case discussed with Care Management/Social Workerr. Management plans discussed with the patient, family and they are in agreement.  CODE STATUS: full  TOTAL TIME TAKING CARE OF THIS PATIENT: 33 minutes.   Berna Spare Mayo M.D on 05/24/2019 at 12:02 PM  Between 7am to 6pm - Pager - [phone]  After 6pm go to [email or web address] - password [email or web address] [facility]  Office  [phone]  CC: Primary care physician; [place], [name], MD   Note: This dictation was prepared with Dragon dictation along with smaller phrase technology. Any transcriptional errors that result from this process are unintentional.

## 2019-05-11 NOTE — Progress Notes (Signed)
Pt remained on CRRT unitl 530am machine clotted was unable to do a rinse back. Verbal orders to not restart CRRT will assess pt for HD today.

## 2019-05-11 NOTE — Progress Notes (Signed)
[facility]  ROUNDING NOTE   Subjective:  Patient remains critically ill at the moment. Catheter clotting issues noted. Appreciate vascular surgery assistance.   Objective:  Vital signs in last 24 hours:  Temp:  [97.7 F (36.5 C)-99.8 F (37.7 C)] 99.8 F (37.7 C) (06/08 1230) Pulse Rate:  [89-95] 90 (06/08 1530) Resp:  [17-19] 18 (06/08 1600) BP: (108-165)/(54-68) 122/55 (06/08 1600) SpO2:  [88 %-95 %] 92 % (06/08 1530) FiO2 (%):  [45 %-80 %] 45 % (06/08 1530) Weight:  [137.8 kg] 137.8 kg (06/08 0400)  Weight change: -1.9 kg Filed Weights   05/10/19 0337 05/10/19 1000 05/17/2019 0400  Weight: (!) 140.4 kg (!) 140.6 kg (!) 137.8 kg    Intake/Output: I/O last 3 completed shifts: In: 1705.2 [I.V.:1527.3; IV Piggyback:177.9] Out: 2941 [Other:2941]   Intake/Output this shift:  Total I/O In: 647 [I.V.:590.3; IV Piggyback:56.7] Out: -   Physical Exam: General: Chronically ill appearing  HEENT ET tube, OG tube  Neck: supple  Lungs:  Bilateral rhonchi, vent assisted  Heart: Regular rate and rhythm  Abdomen:  Soft, nontender, obese  Extremities: + peripheral edema.  Neurologic: Sedated  Skin: warm  Access: thrombosed left arm AVG, rt fem temp cath replaced 6/8    Basic Metabolic Panel: Recent Labs  Lab 05/10/19 0520 05/10/19 1305 05/10/19 1508 05/10/19 2000 05/10/19 2357 05/17/2019 0414  NA 138 136 135 135 137 137  K 5.2* 4.7 4.7 3.4* 3.8 3.8  CL 101 100 101 102 101 101  CO2 23 20* 20* 20* 22 25  GLUCOSE 199* 252* 252* 215* 188* 179*  BUN 38* 40* 39* 35* 34* 34*  CREATININE 5.66* 5.96* 5.97* 4.79* 4.43* 4.35*  CALCIUM 8.3* 7.7* 7.8* 7.6* 8.4* 8.1*  MG 2.0  --  2.0  --   --  1.9  PHOS 6.1* 5.9*  --  4.3 4.2 3.7    Liver Function Tests: Recent Labs  Lab 05/10/19 0520 05/10/19 1305 05/10/19 2000 05/10/19 2357 05/30/2019 0414  ALBUMIN 2.6* 2.5* 2.5* 2.6* 2.4*   No results for input(s): LIPASE, AMYLASE in the last 168 hours. No results for  input(s): AMMONIA in the last 168 hours.  CBC: Recent Labs  Lab 05/05/19 0458 05/06/19 0437 05/09/19 0507 05/10/19 0520 05/06/2019 0414  WBC 11.4* 10.9* 16.6* 32.0* 14.7*  NEUTROABS 7.9*  --  12.8*  --   --   HGB 8.5* 8.7* 8.6* 9.0* 7.9*  HCT 26.7* 27.6* 28.6* 30.5* 25.3*  MCV 92.4 93.2 97.9 99.3 95.8  PLT 243 259 304 255 175    Cardiac Enzymes: Recent Labs  Lab 05/06/19 0437  TROPONINI 0.03*    BNP: Invalid input(s): POCBNP  CBG: Recent Labs  Lab 05/10/19 1956 05/10/19 2349 05/10/2019 0335 05/18/2019 0749 05/14/2019 1126  GLUCAP 220* 197* 160* 158* 168*    Microbiology: Results for orders placed or performed during the hospital encounter of 04/07/2019  SARS Coronavirus 2 (CEPHEID - Performed in [place] hospital lab), Hosp Order     Status: None   Collection Time: 04/17/2019  7:31 AM  Result Value Ref Range Status   SARS Coronavirus 2 NEGATIVE NEGATIVE Final    Comment: (NOTE) If result is NEGATIVE SARS-CoV-2 target nucleic acids are NOT DETECTED. The SARS-CoV-2 RNA is generally detectable in upper and lower  respiratory specimens during the acute phase of infection. The lowest  concentration of SARS-CoV-2 viral copies this assay can detect is 250  copies / mL. A negative result does not preclude SARS-CoV-2  infection  and should not be used as the sole basis for treatment or other  patient management decisions.  A negative result may occur with  improper specimen collection / handling, submission of specimen other  than nasopharyngeal swab, presence of viral mutation(s) within the  areas targeted by this assay, and inadequate number of viral copies  (<250 copies / mL). A negative result must be combined with clinical  observations, patient history, and epidemiological information. If result is POSITIVE SARS-CoV-2 target nucleic acids are DETECTED. The SARS-CoV-2 RNA is generally detectable in upper and lower  respiratory specimens dur ing the acute phase of  infection.  Positive  results are indicative of active infection with SARS-CoV-2.  Clinical  correlation with patient history and other diagnostic information is  necessary to determine patient infection status.  Positive results do  not rule out bacterial infection or co-infection with other viruses. If result is PRESUMPTIVE POSTIVE SARS-CoV-2 nucleic acids MAY BE PRESENT.   A presumptive positive result was obtained on the submitted specimen  and confirmed on repeat testing.  While 2019 novel coronavirus  (SARS-CoV-2) nucleic acids may be present in the submitted sample  additional confirmatory testing may be necessary for epidemiological  and / or clinical management purposes  to differentiate between  SARS-CoV-2 and other Sarbecovirus currently known to infect humans.  If clinically indicated additional testing with an alternate test  methodology [phone]) is advised. The SARS-CoV-2 RNA is generally  detectable in upper and lower respiratory sp ecimens during the acute  phase of infection. The expected result is Negative. Fact Sheet for Patients:  StrictlyIdeas.no Fact Sheet for Healthcare Providers: BankingDealers.co.za This test is not yet approved or cleared by the [place] FDA and has been authorized for detection and/or diagnosis of SARS-CoV-2 by FDA under an Emergency Use Authorization (EUA).  This EUA will remain in effect (meaning this test can be used) for the duration of the COVID-19 declaration under Section 564(b)(1) of the Act, 21 U.S.C. section 360bbb-3(b)(1), unless the authorization is terminated or revoked sooner. Performed at [facility], [address] [place], [place] [address]   MRSA PCR Screening     Status: None   Collection Time: 04/25/2019  9:04 AM  Result Value Ref Range Status   MRSA by PCR NEGATIVE NEGATIVE Final    Comment:        The GeneXpert MRSA Assay (FDA approved for NASAL  specimens only), is one component of a comprehensive MRSA colonization surveillance program. It is not intended to diagnose MRSA infection nor to guide or monitor treatment for MRSA infections. Performed at [facility], [address] [place], [place] [address]   Culture, respiratory (non-expectorated)     Status: None   Collection Time: 05/01/19 11:05 AM  Result Value Ref Range Status   Specimen Description   Final    TRACHEAL ASPIRATE Performed at [facility], [address] [place], [place] [address]    Special Requests   Final    NONE Performed at [facility], [address] [place], [place] [address]    Gram Stain   Final    MODERATE WBC PRESENT, PREDOMINANTLY PMN RARE YEAST Performed at [facility], [place] [address], [place],  [address]    Culture FEW CANDIDA ALBICANS FEW CANDIDA TROPICALIS   Final   Report Status 05/04/2019 FINAL  Final  Culture, respiratory     Status: None (Preliminary result)   Collection Time: 05/09/19  6:30 PM  Result Value Ref Range Status   Specimen Description   Final    INDUCED SPUTUM Performed at [facility], [address] [place], [place] [address]    Special Requests   Final    NONE Performed at [facility], [address] [place], [place] [address]    Gram Stain   Final    MODERATE WBC PRESENT, PREDOMINANTLY PMN FEW GRAM NEGATIVE RODS    Culture   Final    MODERATE PSEUDOMONAS AERUGINOSA SUSCEPTIBILITIES TO FOLLOW Performed at [facility], [address] [address] [place], [place] [address]    Report Status PENDING  Incomplete    Coagulation Studies: No results for input(s): LABPROT, INR in the last 72 hours.  Urinalysis: No results for input(s): COLORURINE, LABSPEC, PHURINE, GLUCOSEU, HGBUR, BILIRUBINUR, KETONESUR, PROTEINUR, UROBILINOGEN, NITRITE, LEUKOCYTESUR in the last 72 hours.  Invalid input(s): APPERANCEUR    Imaging: Dg Chest Port 1  View  Result Date: 05/10/2019 CLINICAL DATA:  Acute respiratory failure. EXAM: PORTABLE CHEST 1 VIEW COMPARISON:  Chest radiograph 05/09/2019 FINDINGS: ET tube terminates in the mid trachea. Enteric tube courses inferior to the diaphragm. Central venous catheter tip projects over the superior vena cava. Stable cardiomegaly. Elevation right hemidiaphragm. Similar-appearing bilateral mid lower lung heterogeneous opacities. Probable small left pleural effusion. IMPRESSION: Similar-appearing mid and lower lung airspace opacities. Possible small left pleural effusion. Electronically Signed   By: Lovey Newcomer M.D.   On: 05/10/2019 06:47   Dg Chest Port 1 View  Result Date: 05/09/2019 CLINICAL DATA:  Congestive heart failure.  Hypoxia.  Ex-smoker. EXAM: PORTABLE CHEST 1 VIEW COMPARISON:  05/09/2019 at 0552 hours. FINDINGS: 1817 hours. Large bore left-sided central line terminates at the high SVC. Endotracheal tube is difficult to visualize centrally but likely terminates 2.9 cm above carina. Nasogastric tube extends beyond the inferior aspect of the film. Mildly degraded exam due to AP portable technique and patient body habitus. Normal heart size for level of inspiration. Right costophrenic angle minimally excluded. moderate right hemidiaphragm elevation with extremely low lung volumes. No overt congestive heart failure. Suspect residual right base airspace disease. IMPRESSION: Cardiomegaly and extremely low lung volumes. Probable residual right base airspace disease/atelectasis. Electronically Signed   By: Abigail Miyamoto M.D.   On: 05/09/2019 18:59     Medications:   . sodium chloride 200 mL (05/10/19 1200)  . dexmedetomidine (PRECEDEX) IV infusion 0.8 mcg/kg/hr (05/04/2019 1306)  . famotidine (PEPCID) IV    . fentaNYL infusion INTRAVENOUS 200 mcg/hr (06/01/2019 0326)  . heparin Stopped (05/29/2019 0530)  . norepinephrine (LEVOPHED) Adult infusion Stopped (05/30/2019 1240)  . piperacillin-tazobactam (ZOSYN)  IV  3.375 g (05/25/2019 1459)  . pureflow 1,500 mL (05/10/19 2200)  . vasopressin (PITRESSIN) infusion - *FOR SHOCK* Stopped (05/10/19 1634)   . allopurinol  150 mg Per Tube Once per day on Tue Thu Sat  . amitriptyline  25 mg Per Tube QHS  . atorvastatin  40 mg Per Tube Daily  . budesonide (PULMICORT) nebulizer solution  0.5 mg Nebulization BID  . Chlorhexidine Gluconate Cloth  6 each Topical Daily  . citalopram  20 mg Per Tube Daily  . [START ON 05/10/2019] clopidogrel  75 mg Per Tube Daily  . epoetin (EPOGEN/PROCRIT) injection  10,000 Units Intravenous Q T,Th,Sa-HD  . heparin injection (subcutaneous)  5,000 Units Subcutaneous Q8H  . hydrocortisone sod succinate (SOLU-CORTEF) inj  50 mg Intravenous Q6H  . insulin aspart  0-9 Units Subcutaneous Q4H  . insulin aspart  3 Units  Subcutaneous Q4H  . insulin glargine  5 Units Subcutaneous QHS  . ipratropium-albuterol  3 mL Nebulization Q4H  . mouth rinse  15 mL Mouth Rinse 10 times per day  . midodrine  10 mg Per Tube TID WC  . multivitamin  15 mL Per Tube Daily  . senna-docusate  1 tablet Per Tube BID  . sodium chloride flush  10-40 mL Intracatheter Q12H   albuterol, fentaNYL, fentaNYL (SUBLIMAZE) injection, heparin, lidocaine-prilocaine, LORazepam, midazolam, nystatin, polyethylene glycol, polyvinyl alcohol, sodium chloride flush, vecuronium  Assessment/ Plan:  Ms. [name] is a 63 y.o. black female with end stage renal disease on hemodialysis, hypertension, CVA, obstructive sleep apnea, peripheral vascular disease, hypertension, gout, GERD, diabetes mellitus type II, COPD, coronary artery disease, congestive heart failure, who  was admitted to [facility] on 04/25/2019 for pneumonia. Acute respiratory failure requiring mechanical ventilation and vasopressors.   TTS [facility] left AVG   1. End stage renal disease  With generalized edema/volume overload Complication of dialysis device, clotted AVG.  Started on CRRT June  6 -Appreciate vascular surgery assistance.  Right femoral dialysis catheter replaced.  Restart CRRT.  2. Anemia of chronic kidney disease:  Lab Results  Component Value Date   HGB 7.9 (L) 05/13/2019   - EPO with HD treatment TTS  3. Hypotension:  -Pressor weaning as per pulmonary/critical care.  4. Acute respiratory failure with cardiopulmonary arrest. Extubated 6/2, re-intubated 6/5 FiO2 100% Continue volume removal with CRRT to aid with weaning from the ventilator.    LOS: 13   6/8/20204:09 PM

## 2019-05-11 NOTE — Progress Notes (Signed)
CRRT initiated @ 1710 hrs.

## 2019-05-11 NOTE — Progress Notes (Signed)
ENT Stopped by to see patient. Saw Dr Tami Ribas had seen her Friday and planning a trach this week. Will discuss with him and plan to help him with the surgery. Will notify the ICU as soon as an operating time can be arranged.  Margaretha Sheffield   05/08/2019 7:30pm

## 2019-05-11 NOTE — Progress Notes (Signed)
Spoke with Dr. Tami Ribas confirming pt is scheduled to have trach in am the patient  is noted to be on schedule for in the morning 6/9. Orders to stop all anti coagulants. Keep pt NPO. And no ordered labs for procedure.consent noted in the chart.

## 2019-05-12 ENCOUNTER — Encounter: Admission: EM | Disposition: E | Payer: Self-pay | Source: Home / Self Care | Attending: Internal Medicine

## 2019-05-12 ENCOUNTER — Encounter: Payer: Self-pay | Admitting: Vascular Surgery

## 2019-05-12 ENCOUNTER — Inpatient Hospital Stay
Admit: 2019-05-12 | Discharge: 2019-05-12 | Disposition: A | Discharge status: Home / Self Care | Payer: Medicare Other | Attending: Pulmonary Disease | Admitting: Pulmonary Disease

## 2019-05-12 ENCOUNTER — Encounter: Payer: Self-pay | Admitting: Anesthesiology

## 2019-05-12 DIAGNOSIS — I5033 Acute on chronic diastolic (congestive) heart failure: Secondary | ICD-10-CM

## 2019-05-12 DIAGNOSIS — N185 Chronic kidney disease, stage 5: Secondary | ICD-10-CM

## 2019-05-12 LAB — RENAL FUNCTION PANEL
Albumin: 2.3 g/dL — ABNORMAL LOW (ref 3.5–5.0)
Albumin: 2.2 g/dL — ABNORMAL LOW (ref 3.5–5.0)
Albumin: 2.3 g/dL — ABNORMAL LOW (ref 3.5–5.0)
Albumin: 2.2 g/dL — ABNORMAL LOW (ref 3.5–5.0)
Albumin: 2.3 g/dL — ABNORMAL LOW (ref 3.5–5.0)
Albumin: 2.3 g/dL — ABNORMAL LOW (ref 3.5–5.0)
Anion gap: 10 (ref 5–15)
Anion gap: 12 (ref 5–15)
Anion gap: 12 (ref 5–15)
Anion gap: 13 (ref 5–15)
Anion gap: 13 (ref 5–15)
Anion gap: 10 (ref 5–15)
BUN: 41 mg/dL — ABNORMAL HIGH (ref 8–23)
BUN: 42 mg/dL — ABNORMAL HIGH (ref 8–23)
BUN: 46 mg/dL — ABNORMAL HIGH (ref 8–23)
BUN: 45 mg/dL — ABNORMAL HIGH (ref 8–23)
BUN: 44 mg/dL — ABNORMAL HIGH (ref 8–23)
BUN: 42 mg/dL — ABNORMAL HIGH (ref 8–23)
CO2: 24 mmol/L (ref 22–32)
CO2: 22 mmol/L (ref 22–32)
CO2: 23 mmol/L (ref 22–32)
CO2: 20 mmol/L — ABNORMAL LOW (ref 22–32)
CO2: 21 mmol/L — ABNORMAL LOW (ref 22–32)
CO2: 23 mmol/L (ref 22–32)
Calcium: 7.9 mg/dL — ABNORMAL LOW (ref 8.9–10.3)
Calcium: 7.9 mg/dL — ABNORMAL LOW (ref 8.9–10.3)
Calcium: 7.8 mg/dL — ABNORMAL LOW (ref 8.9–10.3)
Calcium: 7.7 mg/dL — ABNORMAL LOW (ref 8.9–10.3)
Calcium: 7.7 mg/dL — ABNORMAL LOW (ref 8.9–10.3)
Calcium: 7.7 mg/dL — ABNORMAL LOW (ref 8.9–10.3)
Chloride: 103 mmol/L (ref 98–111)
Chloride: 103 mmol/L (ref 98–111)
Chloride: 102 mmol/L (ref 98–111)
Chloride: 104 mmol/L (ref 98–111)
Chloride: 102 mmol/L (ref 98–111)
Chloride: 103 mmol/L (ref 98–111)
Creatinine, Ser: 4.52 mg/dL — ABNORMAL HIGH (ref 0.44–1.00)
Creatinine, Ser: 4.51 mg/dL — ABNORMAL HIGH (ref 0.44–1.00)
Creatinine, Ser: 4.72 mg/dL — ABNORMAL HIGH (ref 0.44–1.00)
Creatinine, Ser: 4.61 mg/dL — ABNORMAL HIGH (ref 0.44–1.00)
Creatinine, Ser: 4.44 mg/dL — ABNORMAL HIGH (ref 0.44–1.00)
Creatinine, Ser: 3.98 mg/dL — ABNORMAL HIGH (ref 0.44–1.00)
GFR calc Af Amer: 11 mL/min — ABNORMAL LOW (ref >60)
GFR calc Af Amer: 11 mL/min — ABNORMAL LOW (ref >60)
GFR calc Af Amer: 11 mL/min — ABNORMAL LOW (ref >60)
GFR calc Af Amer: 11 mL/min — ABNORMAL LOW (ref >60)
GFR calc Af Amer: 11 mL/min — ABNORMAL LOW (ref >60)
GFR calc Af Amer: 13 mL/min — ABNORMAL LOW (ref >60)
GFR calc non Af Amer: 10 mL/min — ABNORMAL LOW (ref >60)
GFR calc non Af Amer: 10 mL/min — ABNORMAL LOW (ref >60)
GFR calc non Af Amer: 9 mL/min — ABNORMAL LOW (ref >60)
GFR calc non Af Amer: 9 mL/min — ABNORMAL LOW (ref >60)
GFR calc non Af Amer: 10 mL/min — ABNORMAL LOW (ref >60)
GFR calc non Af Amer: 11 mL/min — ABNORMAL LOW (ref >60)
Glucose, Bld: 176 mg/dL — ABNORMAL HIGH (ref 70–99)
Glucose, Bld: 168 mg/dL — ABNORMAL HIGH (ref 70–99)
Glucose, Bld: 173 mg/dL — ABNORMAL HIGH (ref 70–99)
Glucose, Bld: 178 mg/dL — ABNORMAL HIGH (ref 70–99)
Glucose, Bld: 205 mg/dL — ABNORMAL HIGH (ref 70–99)
Glucose, Bld: 205 mg/dL — ABNORMAL HIGH (ref 70–99)
Phosphorus: 3.8 mg/dL (ref 2.5–4.6)
Phosphorus: 3.7 mg/dL (ref 2.5–4.6)
Phosphorus: 4.2 mg/dL (ref 2.5–4.6)
Phosphorus: 4.1 mg/dL (ref 2.5–4.6)
Phosphorus: 3.9 mg/dL (ref 2.5–4.6)
Phosphorus: 3.2 mg/dL (ref 2.5–4.6)
Potassium: 3.8 mmol/L (ref 3.5–5.1)
Potassium: 3.7 mmol/L (ref 3.5–5.1)
Potassium: 4 mmol/L (ref 3.5–5.1)
Potassium: 4.4 mmol/L (ref 3.5–5.1)
Potassium: 4.8 mmol/L (ref 3.5–5.1)
Potassium: 4.5 mmol/L (ref 3.5–5.1)
Sodium: 137 mmol/L (ref 135–145)
Sodium: 137 mmol/L (ref 135–145)
Sodium: 137 mmol/L (ref 135–145)
Sodium: 137 mmol/L (ref 135–145)
Sodium: 136 mmol/L (ref 135–145)
Sodium: 136 mmol/L (ref 135–145)

## 2019-05-12 LAB — BLOOD GAS, ARTERIAL
Acid-base deficit: 1.7 mmol/L (ref 0.0–2.0)
Bicarbonate: 23 mmol/L (ref 20.0–28.0)
FIO2: 0.6
MECHVT: 480 mL
Mechanical Rate: 26
O2 Saturation: 97.2 %
PEEP: 15 cmH2O
Patient temperature: 37
RATE: 26 resp/min
pCO2 arterial: 38 mmHg (ref 32.0–48.0)
pH, Arterial: 7.39 (ref 7.350–7.450)
pO2, Arterial: 94 mmHg (ref 83.0–108.0)

## 2019-05-12 LAB — CULTURE, RESPIRATORY W GRAM STAIN

## 2019-05-12 LAB — CBC WITH DIFFERENTIAL/PLATELET
Abs Immature Granulocytes: 0.16 10*3/uL — ABNORMAL HIGH (ref 0.00–0.07)
Basophils Absolute: 0 10*3/uL (ref 0.0–0.1)
Basophils Relative: 0 %
Eosinophils Absolute: 0 10*3/uL (ref 0.0–0.5)
Eosinophils Relative: 0 %
HCT: 24.1 % — ABNORMAL LOW (ref 36.0–46.0)
Hemoglobin: 7.6 g/dL — ABNORMAL LOW (ref 12.0–15.0)
Immature Granulocytes: 1 %
Lymphocytes Relative: 4 %
Lymphs Abs: 0.7 10*3/uL (ref 0.7–4.0)
MCH: 29.3 pg (ref 26.0–34.0)
MCHC: 31.5 g/dL (ref 30.0–36.0)
MCV: 93.1 fL (ref 80.0–100.0)
Monocytes Absolute: 1.1 10*3/uL — ABNORMAL HIGH (ref 0.1–1.0)
Monocytes Relative: 7 %
Neutro Abs: 13.7 10*3/uL — ABNORMAL HIGH (ref 1.7–7.7)
Neutrophils Relative %: 88 %
Platelets: 163 10*3/uL (ref 150–400)
RBC: 2.59 MIL/uL — ABNORMAL LOW (ref 3.87–5.11)
RDW: 18.4 % — ABNORMAL HIGH (ref 11.5–15.5)
WBC: 15.7 10*3/uL — ABNORMAL HIGH (ref 4.0–10.5)
nRBC: 1.8 % — ABNORMAL HIGH (ref 0.0–0.2)

## 2019-05-12 LAB — GLUCOSE, CAPILLARY
Glucose-Capillary: 142 mg/dL — ABNORMAL HIGH (ref 70–99)
Glucose-Capillary: 157 mg/dL — ABNORMAL HIGH (ref 70–99)
Glucose-Capillary: 163 mg/dL — ABNORMAL HIGH (ref 70–99)
Glucose-Capillary: 169 mg/dL — ABNORMAL HIGH (ref 70–99)
Glucose-Capillary: 190 mg/dL — ABNORMAL HIGH (ref 70–99)
Glucose-Capillary: 191 mg/dL — ABNORMAL HIGH (ref 70–99)
Glucose-Capillary: 203 mg/dL — ABNORMAL HIGH (ref 70–99)

## 2019-05-12 LAB — ECHOCARDIOGRAM COMPLETE
Height: 65.984 in
Weight: 4853.65 oz

## 2019-05-12 LAB — MAGNESIUM: Magnesium: 2.1 mg/dL (ref 1.7–2.4)

## 2019-05-12 LAB — APTT: aPTT: 85 seconds — ABNORMAL HIGH (ref 24–36)

## 2019-05-12 SURGERY — CREATION, TRACHEOSTOMY
Anesthesia: General

## 2019-05-12 MED ORDER — VITAL HIGH PROTEIN PO LIQD
1000.0000 mL | ORAL | Status: DC
  Administered 2019-05-12 – 2019-05-19 (×5): 1000 mL

## 2019-05-12 MED ORDER — PRO-STAT SUGAR FREE PO LIQD
30.0000 mL | Freq: Every day | ORAL | Status: DC
  Administered 2019-05-12 – 2019-05-20 (×22): 30 mL

## 2019-05-12 MED ORDER — ALTEPLASE 2 MG IJ SOLR
2.0000 mg | Freq: Once | INTRAMUSCULAR | Status: AC
  Administered 2019-05-12: 2 mg

## 2019-05-12 MED ORDER — FREE WATER
30.0000 mL | Status: DC
  Administered 2019-05-12 – 2019-05-20 (×26): 30 mL

## 2019-05-12 MED ORDER — ALTEPLASE 2 MG IJ SOLR: 2.8000 mg | Freq: Once | INTRAMUSCULAR | Status: DC

## 2019-05-12 SURGICAL SUPPLY — 32 items
BLADE SURG 15 STRL LF DISP TIS (BLADE) ×1 IMPLANT
BLADE SURG 15 STRL SS (BLADE) ×1
BLADE SURG SZ11 CARB STEEL (BLADE) ×2 IMPLANT
CANISTER SUCT 1200ML W/VALVE (MISCELLANEOUS) ×2 IMPLANT
COVER WAND RF STERILE (DRAPES) ×2 IMPLANT
ELECT CAUTERY BLADE TIP 2.5 (TIP) ×2
ELECT REM PT RETURN 9FT ADLT (ELECTROSURGICAL) ×2
ELECTRODE CAUTERY BLDE TIP 2.5 (TIP) ×1 IMPLANT
ELECTRODE REM PT RTRN 9FT ADLT (ELECTROSURGICAL) ×1 IMPLANT
GAUZE PACKING IODOFORM 1/2 (PACKING) IMPLANT
GLOVE BIO SURGEON STRL SZ7.5 (GLOVE) ×2 IMPLANT
GOWN STRL REUS W/ TWL LRG LVL3 (GOWN DISPOSABLE) ×3 IMPLANT
GOWN STRL REUS W/TWL LRG LVL3 (GOWN DISPOSABLE) ×3
HLDR TRACH TUBE NECKBAND 18 (MISCELLANEOUS) ×1 IMPLANT
HOLDER TRACH TUBE NECKBAND 18 (MISCELLANEOUS) ×1
LABEL OR SOLS (LABEL) ×2 IMPLANT
NS IRRIG 500ML POUR BTL (IV SOLUTION) ×2 IMPLANT
PACK HEAD/NECK (MISCELLANEOUS) ×2 IMPLANT
SHEARS HARMONIC 9CM CVD (BLADE) ×2 IMPLANT
SPONGE DRAIN TRACH 4X4 STRL 2S (GAUZE/BANDAGES/DRESSINGS) ×2 IMPLANT
SPONGE KITTNER 5P (MISCELLANEOUS) ×2 IMPLANT
SPONGE VERSALON 4X4 4PLY (MISCELLANEOUS) ×2 IMPLANT
SUCTION FRAZIER HANDLE 10FR (MISCELLANEOUS) ×1
SUCTION TUBE FRAZIER 10FR DISP (MISCELLANEOUS) ×1 IMPLANT
SUT ETHILON 2 0 FS 18 (SUTURE) ×8 IMPLANT
SUT SILK 2 0 (SUTURE) ×1
SUT SILK 2 0 SH (SUTURE) ×2 IMPLANT
SUT SILK 2-0 18XBRD TIE 12 (SUTURE) ×1 IMPLANT
SUT VIC AB 3-0 PS2 18 (SUTURE) ×4 IMPLANT
SYR 3ML LL SCALE MARK (SYRINGE) ×2 IMPLANT
TUBE TRACH SHILEY  6 DIST  CUF (TUBING) IMPLANT
TUBE TRACH SHILEY 8 DIST CUF (TUBING) IMPLANT

## 2019-05-12 NOTE — Progress Notes (Signed)
PHARMACY CONSULT NOTE - FOLLOW UP  Pharmacy Consult for Electrolyte Monitoring and Replacement   Recent Labs: Potassium (mmol/L)  Date Value  05/05/2019 4.8  03/31/2015 4.2   Magnesium (mg/dL)  Date Value  05/21/2019 2.1  06/14/2014 1.8   Calcium (mg/dL)  Date Value  05/07/2019 7.7 (L)   Calcium, Total (mg/dL)  Date Value  03/31/2015 7.0 (LL)   Albumin (g/dL)  Date Value  05/05/2019 2.3 (L)  03/31/2015 2.3 (L)   Phosphorus (mg/dL)  Date Value  05/31/2019 3.9  03/31/2015 3.6   Sodium (mmol/L)  Date Value  05/16/2019 136  03/31/2015 129 (L)     Assessment: 6/9:  K and phos are WNL   Goal of Therapy:  Electrolytes WNL   Plan:  No additional electrolytes supplementation needed at this time.  Will recheck electrolytes on 6/10 with AM labs.   Orene Desanctis ,PharmD Clinical Pharmacist 05/31/2019 7:43 PM

## 2019-05-12 NOTE — Progress Notes (Signed)
Pt remained on CRRT until 5 am line clotted pt has heparin dwelling in line .MD will reasses post trach placement . Procedure scheduled for 915.

## 2019-05-12 NOTE — Progress Notes (Signed)
Follow up - Critical Care Medicine Note  Patient Details:    [name] is an 63 y.o. female.morbidly obese,with severe end stage renal disease s/p cardiac arrest and hypothermia protocol with recurrent resp failure from acute diastolic heart failure and aspiration pneumonia  Lines, Airways, Drains: Airway 8 mm (Active)  Secured at (cm) 24 cm 05/27/2019  4:00 PM  Measured From Lips 05/19/2019  4:00 PM  Secured Location Right 05/17/2019  4:00 PM  Secured By [employer] 05/24/2019  4:00 PM  Tube Holder Repositioned Yes 05/24/2019  1:45 PM  Cuff Pressure (cm H2O) 26 cm H2O 05/27/2019  1:45 PM  Site Condition Cool;Dry 05/19/2019  4:00 PM     NG/OG Tube Orogastric Center mouth Xray (Active)  Cm Marking at Nare/Corner of Mouth (if applicable) 57 cm 5/0/3546  4:00 PM  Site Assessment Clean;Dry;Intact 06/02/2019  4:00 PM  Ongoing Placement Verification No change in cm markings or external length of tube from initial placement;No acute changes, not attributed to clinical condition 05/14/2019  4:00 PM  Status Infusing tube feed 05/23/2019  4:00 PM  Intake (mL) 35 mL 05/10/2019  5:03 PM    Anti-infectives:  Anti-infectives (From admission, onward)   Start     Dose/Rate Route Frequency Ordered Stop   05/10/19 0600  piperacillin-tazobactam (ZOSYN) IVPB 3.375 g     3.375 g 12.5 mL/hr over 240 Minutes Intravenous Every 8 hours 05/09/19 2136     05/09/19 2100  piperacillin-tazobactam (ZOSYN) IVPB 3.375 g  Status:  Discontinued     3.375 g 12.5 mL/hr over 240 Minutes Intravenous Every 12 hours 05/09/19 1835 05/09/19 1937   05/09/19 2000  piperacillin-tazobactam (ZOSYN) IVPB 3.375 g  Status:  Discontinued     3.375 g 12.5 mL/hr over 240 Minutes Intravenous Every 12 hours 05/09/19 1937 05/09/19 2136   06/02/2019 0215  ceFAZolin (ANCEF) IVPB 1 g/50 mL premix     1 g 100 mL/hr over 30 Minutes Intravenous  Once 05/11/2019 0210 05/27/2019 0404   05/01/19 1200  azithromycin (ZITHROMAX) 500 mg in sodium chloride  0.9 % 250 mL IVPB  Status:  Discontinued     500 mg 250 mL/hr over 60 Minutes Intravenous Every 24 hours 05/01/19 0940 05/05/19 0915   05/01/19 1000  doxycycline (VIBRAMYCIN) 100 mg in sodium chloride 0.9 % 250 mL IVPB  Status:  Discontinued     100 mg 125 mL/hr over 120 Minutes Intravenous Every 12 hours 05/01/19 0940 05/05/19 0915   04/30/19 2200  ceFEPIme (MAXIPIME) 2 g in sodium chloride 0.9 % 100 mL IVPB  Status:  Discontinued     2 g 200 mL/hr over 30 Minutes Intravenous Every 24 hours 04/30/19 0708 04/30/19 1431   04/30/19 2200  ceFEPIme (MAXIPIME) 2 g in sodium chloride 0.9 % 100 mL IVPB  Status:  Discontinued     2 g 200 mL/hr over 30 Minutes Intravenous Every 12 hours 04/30/19 1431 05/01/19 0940   04/29/19 1800  ceFEPIme (MAXIPIME) 1 g in sodium chloride 0.9 % 100 mL IVPB  Status:  Discontinued     1 g 200 mL/hr over 30 Minutes Intravenous Every 24 hours 04/07/2019 1156 04/22/2019 1340   04/29/19 1200  vancomycin (VANCOCIN) IVPB 1000 mg/200 mL premix  Status:  Discontinued     1,000 mg 200 mL/hr over 60 Minutes Intravenous Every 24 hours 04/11/2019 1340 04/22/2019 1853   05/03/2019 2200  ceFEPIme (MAXIPIME) 2 g in sodium chloride 0.9 % 100 mL IVPB  Status:  Discontinued     2 g 200 mL/hr over 30 Minutes Intravenous Every 12 hours 04/09/2019 1340 04/30/19 0708   04/24/2019 1200  vancomycin (VANCOCIN) IVPB 1000 mg/200 mL premix  Status:  Discontinued     1,000 mg 200 mL/hr over 60 Minutes Intravenous  Once 04/27/2019 1153 04/26/2019 1853   04/26/2019 0830  vancomycin (VANCOCIN) IVPB 1000 mg/200 mL premix     1,000 mg 200 mL/hr over 60 Minutes Intravenous  Once 05/01/2019 0820 04/07/2019 1200   04/29/2019 0830  ceFEPIme (MAXIPIME) 2 g in sodium chloride 0.9 % 100 mL IVPB     2 g 200 mL/hr over 30 Minutes Intravenous  Once 04/20/2019 0820 04/06/2019 1022      Microbiology: Results for orders placed or performed during the hospital encounter of 04/27/2019  SARS Coronavirus 2 (CEPHEID - Performed in [facility] hospital lab), Hosp Order     Status: None   Collection Time: 04/21/2019  7:31 AM  Result Value Ref Range Status   SARS Coronavirus 2 NEGATIVE NEGATIVE Final    Comment: (NOTE) If result is NEGATIVE SARS-CoV-2 target nucleic acids are NOT DETECTED. The SARS-CoV-2 RNA is generally detectable in upper and lower  respiratory specimens during the acute phase of infection. The lowest  concentration of SARS-CoV-2 viral copies this assay can detect is 250  copies / mL. A negative result does not preclude SARS-CoV-2 infection  and should not be used as the sole basis for treatment or other  patient management decisions.  A negative result may occur with  improper specimen collection / handling, submission of specimen other  than nasopharyngeal swab, presence of viral mutation(s) within the  areas targeted by this assay, and inadequate number of viral copies  (<250 copies / mL). A negative result must be combined with clinical  observations, patient history, and epidemiological information. If result is POSITIVE SARS-CoV-2 target nucleic acids are DETECTED. The SARS-CoV-2 RNA is generally detectable in upper and lower  respiratory specimens dur ing the acute phase of infection.  Positive  results are indicative of active infection with SARS-CoV-2.  Clinical  correlation with patient history and other diagnostic information is  necessary to determine patient infection status.  Positive results do  not rule out bacterial infection or co-infection with other viruses. If result is PRESUMPTIVE POSTIVE SARS-CoV-2 nucleic acids MAY BE PRESENT.   A presumptive positive result was obtained on the submitted specimen  and confirmed on repeat testing.  While 2019 novel coronavirus  (SARS-CoV-2) nucleic acids may be present in the submitted sample  additional confirmatory testing may be necessary for epidemiological  and / or clinical management purposes  to differentiate between  SARS-CoV-2 and  other Sarbecovirus currently known to infect humans.  If clinically indicated additional testing with an alternate test  methodology [phone]) is advised. The SARS-CoV-2 RNA is generally  detectable in upper and lower respiratory sp ecimens during the acute  phase of infection. The expected result is Negative. Fact Sheet for Patients:  StrictlyIdeas.no Fact Sheet for Healthcare Providers: BankingDealers.co.za This test is not yet approved or cleared by the [place] FDA and has been authorized for detection and/or diagnosis of SARS-CoV-2 by FDA under an Emergency Use Authorization (EUA).  This EUA will remain in effect (meaning this test can be used) for the duration of the COVID-19 declaration under Section 564(b)(1) of the Act, 21 U.S.C. section 360bbb-3(b)(1), unless the authorization is terminated or revoked sooner. Performed at [facility], [place],  [place], [place] [address]   MRSA PCR Screening     Status: None   Collection Time: 04/12/2019  9:04 AM  Result Value Ref Range Status   MRSA by PCR NEGATIVE NEGATIVE Final    Comment:        The GeneXpert MRSA Assay (FDA approved for NASAL specimens only), is one component of a comprehensive MRSA colonization surveillance program. It is not intended to diagnose MRSA infection nor to guide or monitor treatment for MRSA infections. Performed at [facility], [address] [place], [place] [address]   Culture, respiratory (non-expectorated)     Status: None   Collection Time: 05/01/19 11:05 AM  Result Value Ref Range Status   Specimen Description   Final    TRACHEAL ASPIRATE Performed at [facility], [address], [place], [place] [address]    Special Requests   Final    NONE Performed at [facility], [address] [place], [place] [address]    Gram Stain   Final    MODERATE WBC PRESENT, PREDOMINANTLY PMN RARE  YEAST Performed at [facility], [place] [address] [place], [place] [address]    Culture FEW CANDIDA ALBICANS FEW CANDIDA TROPICALIS   Final   Report Status 05/04/2019 FINAL  Final  Culture, respiratory     Status: None   Collection Time: 05/09/19  6:30 PM  Result Value Ref Range Status   Specimen Description   Final    INDUCED SPUTUM Performed at [facility], [address] [place], [place] [address]    Special Requests   Final    NONE Performed at [facility], [address] [place], [place] [address]    Gram Stain   Final    MODERATE WBC PRESENT, PREDOMINANTLY PMN FEW GRAM NEGATIVE RODS Performed at [facility], [place] [address] [place], [place] [address]    Culture MODERATE PSEUDOMONAS AERUGINOSA  Final   Report Status 05/09/2019 FINAL  Final   Organism ID, Bacteria PSEUDOMONAS AERUGINOSA  Final      Susceptibility   Pseudomonas aeruginosa - MIC*    CEFTAZIDIME 2 SENSITIVE Sensitive     CIPROFLOXACIN <=0.25 SENSITIVE Sensitive     GENTAMICIN <=1 SENSITIVE Sensitive     IMIPENEM 2 SENSITIVE Sensitive     PIP/TAZO <=4 SENSITIVE Sensitive     CEFEPIME <=1 SENSITIVE Sensitive     * MODERATE PSEUDOMONAS AERUGINOSA    Best Practice/Protocols:  VTE Prophylaxis: Heparin (drip) GI Prophylaxis: Antihistamine   Continous Sedation Hyperglycemia (ICU)  Events: 5/26 ICU admission for resp failure and cardiac arrest 5/26 hypothermia protocol 6/3 successfully extubated 6/5 re-admitted to the ICU for aspiration pneumonia and progressive diastolic heart failure and renal failure 6/6 plan for [facility], ENT consulted, remains critically ill on vent 6/6 near cardiac arrest, fio2 100%, PEEP 15 6/7 severe resp failure, multiorgan failure  Studies: Dg Abd 1 View  Result Date: 05/06/2019 CLINICAL DATA:  OG tube placement. EXAM: ABDOMEN - 1 VIEW COMPARISON:  04/19/2019. FINDINGS: OG tube noted with tip below left hemidiaphragm. Severely distended loops  of bowel are noted. Abdominal series suggested for further evaluation. No definite free air noted. IMPRESSION: NG tube noted with its tip under the left hemidiaphragm. Severely distended loops of bowel are noted. Abdominal series suggested for further evaluation. Electronically Signed   By: Marcello Moores  Register   On: 05/21/2019 15:50   Dg Chest Port 1 View  Result Date: 05/10/2019 CLINICAL DATA:  Acute respiratory  failure. EXAM: PORTABLE CHEST 1 VIEW COMPARISON:  Chest radiograph 05/09/2019 FINDINGS: ET tube terminates in the mid trachea. Enteric tube courses inferior to the diaphragm. Central venous catheter tip projects over the superior vena cava. Stable cardiomegaly. Elevation right hemidiaphragm. Similar-appearing bilateral mid lower lung heterogeneous opacities. Probable small left pleural effusion. IMPRESSION: Similar-appearing mid and lower lung airspace opacities. Possible small left pleural effusion. Electronically Signed   By: Lovey Newcomer M.D.   On: 05/10/2019 06:47   Dg Chest Port 1 View  Result Date: 05/09/2019 CLINICAL DATA:  Congestive heart failure.  Hypoxia.  Ex-smoker. EXAM: PORTABLE CHEST 1 VIEW COMPARISON:  05/09/2019 at 0552 hours. FINDINGS: 1817 hours. Large bore left-sided central line terminates at the high SVC. Endotracheal tube is difficult to visualize centrally but likely terminates 2.9 cm above carina. Nasogastric tube extends beyond the inferior aspect of the film. Mildly degraded exam due to AP portable technique and patient body habitus. Normal heart size for level of inspiration. Right costophrenic angle minimally excluded. moderate right hemidiaphragm elevation with extremely low lung volumes. No overt congestive heart failure. Suspect residual right base airspace disease. IMPRESSION: Cardiomegaly and extremely low lung volumes. Probable residual right base airspace disease/atelectasis. Electronically Signed   By: Abigail Miyamoto M.D.   On: 05/09/2019 18:59   Dg Chest Port 1  View  Result Date: 05/09/2019 CLINICAL DATA:  Acute respiratory failure EXAM: PORTABLE CHEST 1 VIEW COMPARISON:  05/18/2019 FINDINGS: Endotracheal tube tip is just above the level of the carina. Retraction by 2-3 cm would place it at the level of the clavicular heads. Enteric tube side port projects over the stomach. Left IJ large bore central venous catheter is unchanged. Bibasilar atelectasis. IMPRESSION: Endotracheal tube tip just above the level of the carina. Retraction by 2-3 cm is recommended. Electronically Signed   By: Ulyses Jarred M.D.   On: 05/09/2019 06:21   Dg Chest Port 1 View  Result Date: 05/27/2019 CLINICAL DATA:  Intubation EXAM: PORTABLE CHEST 1 VIEW COMPARISON:  Portable exam 1454 hours compared to 05/07/2019 FINDINGS: Tip of endotracheal tube projects 2.1 cm above carina. Nasogastric tube extends into abdomen. Rotated to the RIGHT. LEFT jugular large-bore central venous catheter with tip projecting over SVC. Enlargement of cardiac silhouette with pulmonary vascular congestion. Probable pulmonary edema and layered RIGHT pleural effusion IMPRESSION: Tip of endotracheal tube projects 2.1 cm above carina. Question pulmonary edema and layering RIGHT pleural effusion. Electronically Signed   By: Lavonia Dana M.D.   On: 05/07/2019 15:49   Dg Chest Port 1 View  Result Date: 05/07/2019 CLINICAL DATA:  Shortness of breath. EXAM: PORTABLE CHEST 1 VIEW COMPARISON:  Radiographs of May 06, 2019. FINDINGS: Stable cardiomediastinal silhouette. Left internal jugular catheter is unchanged. No pneumothorax or significant pleural effusion is noted. Minimal bibasilar subsegmental atelectasis is noted. Bony thorax is unremarkable. IMPRESSION: Minimal bibasilar subsegmental atelectasis. Electronically Signed   By: Marijo Conception M.D.   On: 05/07/2019 09:55   Dg Chest Port 1 View  Result Date: 05/06/2019 CLINICAL DATA:  Shortness of breath EXAM: PORTABLE CHEST 1 VIEW COMPARISON:  Two days ago FINDINGS:  Cardiomegaly and vascular pedicle widening. Very low volume chest with hazy opacity on both sides. Dialysis catheter from the left with tip near the SVC origin. Trachea and esophagus have been extubated. IMPRESSION: Unchanged very low lung volumes with atelectasis and vascular congestion. There may be layering pleural effusions. Electronically Signed   By: Monte Fantasia M.D.   On: 05/06/2019 05:31  Dg Chest Port 1 View  Result Date: 05/04/2019 CLINICAL DATA:  Acute respiratory failure EXAM: PORTABLE CHEST 1 VIEW COMPARISON:  05/01/2019 FINDINGS: Endotracheal tube, gastric catheter and hero graft are again noted and stable. Cardiac shadow is stable. Right upper lobe atelectasis is noted along the minor fissure new from the prior exam. Some improved aeration in right base is noted when compare with the prior exam. Mild vascular congestion is noted. Small left effusion is noted. IMPRESSION: Improved aeration in the right base although some right upper lobe atelectasis is noted. Mild vascular congestion. Electronically Signed   By: Inez Catalina M.D.   On: 05/04/2019 07:17   Dg Chest Port 1 View  Result Date: 05/01/2019 CLINICAL DATA:  Shortness of breath. EXAM: PORTABLE CHEST 1 VIEW COMPARISON:  Chest x-ray from same day at 3:54 a.m. FINDINGS: The patient remains rotated to the right. Unchanged endotracheal and enteric tubes. Unchanged left HeRO graft. Stable cardiomegaly. Normal pulmonary vascularity. Chronic elevation of the right hemidiaphragm with right basilar atelectasis/scarring. Unchanged hazy opacity at the left lung base likely reflecting a combination of atelectasis and small left pleural effusion. No pneumothorax. No acute osseous abnormality. IMPRESSION: 1. Stable chest with bibasilar atelectasis and probable small left pleural effusion. Electronically Signed   By: Titus Dubin M.D.   On: 05/01/2019 10:20   Dg Chest Port 1 View  Result Date: 05/01/2019 CLINICAL DATA:  Acute respiratory  failure. EXAM: PORTABLE CHEST 1 VIEW COMPARISON:  04/29/2019. FINDINGS: Endotracheal tube, NG tube, large caliber left subclavian line in stable position. Stable cardiomegaly. Bilateral pulmonary infiltrates/edema slightly progressed from prior exam. Bibasilar atelectasis particular prominent right lung base. Bilateral pleural effusions cannot be excluded. No pneumothorax. IMPRESSION: 1.  Lines and tubes stable position. 2. Cardiomegaly unchanged. Diffuse bilateral pulmonary infiltrates/edema slightly progressed from prior exam. Bibasilar atelectasis particular prominent the right lung base. Bilateral pleural effusions cannot be excluded. Electronically Signed   By: Marcello Moores  Register   On: 05/01/2019 06:15   Dg Chest Port 1 View  Result Date: 04/29/2019 CLINICAL DATA:  Acute respiratory failure. EXAM: PORTABLE CHEST 1 VIEW COMPARISON:  Single-view of the chest earlier today and 04/16/2019. FINDINGS: Support tubes and lines are unchanged since the most recent comparison. There is cardiomegaly without edema. Bibasilar airspace disease has worsened on the right. No pneumothorax. There may be a right pleural effusion. IMPRESSION: No change in support apparatus. Bibasilar airspace disease is worse on the right. There may be a right pleural effusion. Cardiomegaly. Electronically Signed   By: Inge Rise M.D.   On: 04/29/2019 18:32   Dg Chest Port 1 View  Result Date: 04/29/2019 CLINICAL DATA:  Acute respiratory failure EXAM: PORTABLE CHEST 1 VIEW COMPARISON:  Yesterday FINDINGS: Endotracheal tube tip between the clavicular heads and carina. Left subclavian dialysis catheter with tip at the brachiocephalic SVC confluence. There is an orogastric tube which at least reaches the stomach. Haziness of the bilateral lower chest with obscuration of the left diaphragm. No Kerley lines, effusion, or pneumothorax. Cardiomegaly.  Rightward rotation. IMPRESSION: 1. Stable hardware positioning. 2. Worsening lower lobe  aeration. Electronically Signed   By: Monte Fantasia M.D.   On: 04/29/2019 06:50   Dg Chest Port 1 View  Result Date: 04/06/2019 CLINICAL DATA:  63 year old female history intubation EXAM: PORTABLE CHEST 1 VIEW COMPARISON:  04/25/2019, 03/28/2015 FINDINGS: Cardiomediastinal silhouette unchanged in size and contour with low lung volumes accentuating the diameter of the mediastinum. Asymmetric elevation of the right hemidiaphragm. Coarsened interstitial markings with interlobular  septal thickening and thickening of the minor fissure. Patchy opacity in the right lung base again demonstrated. Interval placement of endotracheal tube which terminates 2.5 cm above the carina. Interval placement of gastric tube terminating out of the field of view. Interval placement of defibrillator pads. Left upper extremity HERO graft, with the tip terminating in the region of the superior vena cava. IMPRESSION: Interval placement of endotracheal tube terminating 2.5 cm above the carina. Interval placement of gastric tube terminating in the abdomen out of the field of view. Interval placement of defibrillator pads. Similar appearance of low lung volumes, with background changes of either chronic scarring and/or early pulmonary edema, with similar appearance airspace disease at the right lung base. Left upper extremity HeRO graft Electronically Signed   By: Corrie Mckusick D.O.   On: 04/11/2019 13:18   Dg Chest Portable 1 View  Result Date: 04/10/2019 CLINICAL DATA:  Short of breath EXAM: PORTABLE CHEST 1 VIEW COMPARISON:  03/28/2015 FINDINGS: New left upper extremity and jugular hair 0 catheter with its tip in the upper SVC. Cardiomegaly. Normal vascularity. Bibasilar opacities likely combination of volume loss and pleural fluid. No pneumothorax. Triangular opacity at the medial right lung base is noted representing either consolidation in the right lower lobe or collapse. IMPRESSION: Right lower lobe collapse versus  consolidation. Bibasilar opacities likely combination of pleural fluid and atelectasis. Electronically Signed   By: Marybelle Killings M.D.   On: 04/27/2019 08:04   Dg Abd Portable 1v  Result Date: 04/04/2019 CLINICAL DATA:  OG tube placement. EXAM: PORTABLE ABDOMEN - 1 VIEW COMPARISON:  One-view chest x-ray FINDINGS: OG tube terminates in the stomach. The stomach is mildly distended. The lung bases are clear. IMPRESSION: 1. OG tube terminates in the stomach. Electronically Signed   By: San Morelle M.D.   On: 04/10/2019 13:29    Consults: Treatment Team:  Algernon Huxley, MD [facility], Ander Gaster, MD Beverly Gust, MD   Subjective:    Overnight Issues: She was to go for tracheostomy today however unable to do so due to high FiO2 requirements.  Patient also has challenging anatomy.  Discussed with Dr. Tami Ribas.  Objective:  Vital signs for last 24 hours: Temp:  [95.2 F (35.1 C)-99 F (37.2 C)] 98.6 F (37 C) (06/09 1645) Pulse Rate:  [55-100] 80 (06/09 1645) Resp:  [17-26] 26 (06/09 1645) BP: (57-204)/(34-93) 127/61 (06/09 1645) SpO2:  [87 %-95 %] 93 % (06/09 1645) FiO2 (%):  [45 %-60 %] 60 % (06/09 1600) Weight:  [137.6 kg] 137.6 kg (06/09 0500)  Hemodynamic parameters for last 24 hours:    Intake/Output from previous day: 06/08 0701 - 06/09 0700 In: 1383.4 [I.V.:1147.7; NG/GT:35; IV Piggyback:200.7] Out: 1105   Intake/Output this shift: Total I/O In: 421.8 [I.V.:378.1; IV Piggyback:43.7] Out: 404 [Other:404]  Vent settings for last 24 hours: Vent Mode: PRVC FiO2 (%):  [45 %-60 %] 60 % Set Rate:  [18 bmp-26 bmp] 26 bmp Vt Set:  [480 mL-580 mL] 480 mL PEEP:  [15 cmH20] 15 cmH20 Plateau Pressure:  [21 cmH20] 21 cmH20  Physical Exam:  GENERAL: Massively obese, intubated, sedated HEAD: Normocephalic, atraumatic.  EYES: Pupils equal, round, reactive to light.  No scleral icterus.  MOUTH: Moist membranes. NECK: Very thick neck, supple.  Trachea midline, no crepitus.   PULMONARY: +rhonchi, +wheezing CARDIOVASCULAR: S1 and S2. Regular rate and rhythm. No murmurs, rubs, or gallops.  GASTROINTESTINAL: Soft, protuberant.Positive bowel sounds.  MUSCULOSKELETAL: Anasarca evident, no clubbing, no joint deformity.   NEUROLOGIC:  obtunded, GCS<8 SKIN:intact,warm,dry  Assessment/Plan:  1.  Acute on chronic hypoxic/hypercarbic respiratory failure: Continue ventilator support.  She has high FiO2 requirements requiring 70% today.  Did not tolerate titration down.  She has been turned down for tracheostomy due to this.  On review today it is noted that her tidal volumes are set for actual body weight rather than predicted or ideal body weight.  Ventilator changes were made for 8 mL's per kilo of predicted body weight.  Discussed with RT.  Patient will require higher PEEP due to massive obesity.  This will be necessary to maintain alveolar recruitment.  Suspect she will be able to go below PEEP of 10.  Currently set PEEP at 15.  2.  Aspiration pneumonia: Continue antibiotics.  3.  Acute on chronic diastolic heart failure: Will reevaluate with 2D echo.  Continue dialysis in the setting of volume overload.  4.  End-stage renal disease with anasarca: Issue is complicated by clotted AV graft.  Started on CRRT June 6.  Continues on CRRT.  Has a right femoral dialysis catheter.  Discussed with Dr. Holley Raring.  5.  Shock: Multifactorial, sepsis, status post arrest, diastolic dysfunction decompensation, he is currently on stress dose steroids.  Continue supportive care with pressors.  He is currently on norepinephrine.  6.  Super morbid obesity: This issue adds extreme complexity to her management  7.  Protocols: As above.    LOS: 14 days   Additional comments: Multidisciplinary rounds were performed with ICU team.  Patient's prognosis overall exceedingly guarded.  Critical Care Total Time*: 45 Minutes  C. Derrill Kay, MD [facility] 05/17/2019  *Care during the  described time interval was provided by me and/or other providers on the critical care team.  I have reviewed this patient's available data, including medical history, events of note, physical examination and test results as part of my evaluation.

## 2019-05-12 NOTE — Progress Notes (Signed)
Nutrition Follow-up  RD working remotely.  DOCUMENTATION CODES:   Morbid obesity  INTERVENTION:  Initiate Vital High Protein at 40 mL/hr (960 mL goal daily volume) + Pro-Stat 30 mL 5 times daily per tube. Provides 1460 kcal, 159 grams of protein, 806 mL H2O daily.  Provide liquid MVI daily per tube.  Provide minimum free water flush of 30 mL Q4hrs to maintain tube patency.  NUTRITION DIAGNOSIS:   Inadequate oral intake related to inability to eat as evidenced by NPO status.  Ongoing.  GOAL:   Provide needs based on ASPEN/SCCM guidelines  Not met.  MONITOR:   Vent status, Labs, I & O's, Skin  REASON FOR ASSESSMENT:   Ventilator, Consult Enteral/tube feeding initiation and management  ASSESSMENT:   63 year old female with PMHx of DM, ESRD on HD, HTN, gout, hx CVA 2004, PVD, CHF, OSA, COPD, anxiety, GERD, arthritis, neuropathy, hx renal cell carcinoma s/p partial left nephrectomy, CAD admitted after cardiac arrest s/p ACLS and intubation on 5/26, with severe hyperkalemia.   -Patient was extubated on 6/2. -She was re-intubated on 6/5.  Patient is intubated and sedated. On PRVC mode with FiO2 60% and PEEP 15 cmH2O. Abdomen is soft per RN documentation. Last BM was 6/4. Patient was restarted on CRRT yesterday but had to be stopped this AM after clotting.  Enteral Access: OGT placed 6/5; terminates in stomach per chest x-ray 6/6; 57 cm at corner of mouth  MAP: 54-122 mmHg  Patient is currently intubated on ventilator support Ve: 10.3 L/min Temp (24hrs), Avg:97.4 F (36.3 C), Min:95.2 F (35.1 C), Max:99 F (37.2 C)  Propofol: N/A  Medications reviewed and include: allopurinol, Epogen 10000 units during HD, Solu-Cortef 50 mg Q6hrs IV, Novolog 0-9 units Q4hrs, Novolog 3 units Q4hrs, Lantus 5 units QHS, liquid MVI daily per tube, senna-docusate 1 tablet BID per tube, Precedex gtt, famotidine, fentanyl gtt, norepinephrine gtt at 3 mcg/min, Zosyn.  Labs reviewed: CBG  142-163, CO2 20, BUN 45, Creatinine 4.61.  I/O: 1105 mL removed yesterday from CRRT  Discussed with RN over the phone. Patient was not able to be taken to OR for tracheostomy today due to high FiO2 requirements. Discussed with MD over secure chat. Plan is to resume tube feeds today.  Diet Order:   Diet Order            Diet NPO time specified  Diet effective midnight             EDUCATION NEEDS:   No education needs have been identified at this time  Skin:  Skin Assessment: Skin Integrity Issues:(MSAD to breasts and groin)  Last BM:  05/07/2019 per chart  Height:   Ht Readings from Last 1 Encounters:  05/18/2019 5' 5.98" (1.676 m)   Weight:   Wt Readings from Last 1 Encounters:  05/04/2019 (!) 137.6 kg   Ideal Body Weight:  59 kg  BMI:  Body mass index is 48.99 kg/m.  Estimated Nutritional Needs:   Kcal:  1455-1852 (11-14 kcal/kg)  Protein:  148 grams (2.5 grams/kg IBW)  Fluid:  UOP + 1 L  Willey Blade, MS, RD, LDN Office: [phone] Pager: [phone] After Hours/Weekend Pager: [phone]

## 2019-05-12 NOTE — Progress Notes (Signed)
[name] at [place] NAME: [name]    MR#:  [id number]  DATE OF BIRTH:  1956-11-22     Patient remains on ventilator and CRRT.  REVIEW OF SYSTEMS:   Unable to obtain due to patient being intubated and sedated  DRUG ALLERGIES:   Allergies  Allergen Reactions  . Iodine Rash  . Enalapril Other (See Comments)    TONGUE SWELLS/GOUT  . Iodinated Diagnostic Agents Other (See Comments)    BREAK OUT IN WHELPS      VITALS:  Blood pressure 127/61, pulse 80, temperature 98.6 F (37 C), resp. rate (!) 26, height 5' 5.98" (1.676 m), weight (!) 137.6 kg, SpO2 93 %.  PHYSICAL EXAMINATION:  GENERAL:  63 y.o.-morbidly obese and critically ill-appearing. EYES: Pupils equal, round, reactive to light  No scleral icterus.  HEENT: Head atraumatic, normocephalic.  Currently intubated, sedated. ETT in place. NECK:  Supple, no jugular venous distention. No thyroid enlargement, no tenderness.  LUNGS: +diffuse rhonchi and wheezing present CARDIOVASCULAR: RRR, S1, S2 normal. No murmurs, rubs, or gallops.  ABDOMEN: Soft, nontender, nondistended. Bowel sounds present. No organomegaly or mass.  EXTREMITIES: +pedal edema, no cyanosis or clubbing.  NEUROLOGIC: Unable to do neuro exam because of intubation, sedation. PSYCHIATRIC: Intubated, sedated.   SKIN: No obvious rash, lesion, or ulcer.   LABORATORY PANEL:   CBC Recent Labs  Lab 05/06/2019 0518  WBC 15.7*  HGB 7.6*  HCT 24.1*  PLT 163   ------------------------------------------------------------------------------------------------------------------  Chemistries  Recent Labs  Lab 05/11/2019 0518  05/19/2019 1335  NA 137   < > 137  K 3.7   < > 4.4  CL 103   < > 104  CO2 22   < > 20*  GLUCOSE 168*   < > 178*  BUN 42*   < > 45*  CREATININE 4.51*   < > 4.61*  CALCIUM 7.9*   < > 7.7*  MG 2.1  --   --    < > = values in this interval not displayed.    ------------------------------------------------------------------------------------------------------------------  Cardiac Enzymes Recent Labs  Lab 05/06/19 0437  TROPONINI 0.03*   ------------------------------------------------------------------------------------------------------------------  RADIOLOGY:  No results found.  EKG:   Orders placed or performed during the hospital encounter of 04/06/2019  . EKG 12-Lead  . EKG 12-Lead  . EKG 12-Lead  . EKG 12-Lead    ASSESSMENT AND PLAN:   63 year old female patient with history of CVA, obstructive sleep apnea on CPAP, ESRD on hemodialysis Tuesday, Thursday, Saturday, congestive heart failure, COPD comes in because of worsening shortness of breath, she was called in the emergency room, had PEA, intubated, admitted to intensive care unit.  Acute hypoxic and hypercapneic respiratory failure- status post PEA arrest. Now with new aspiration pneumonia -Extubated 6/2 and then had to be reintubated 6/5 -Vent management per CCM -ENT planning for trach -Continue Zosyn -Blood and sputum cultures pending  Shock- multi-factorial due to sepsis/hypovolemia/cardiogenic -On multiple pressors -Continue midodrine tid -Abx as above  ESRD- on hemodialysis TTS. Temporary HD cath placed 6/5. -Nephrology following -CRRT started 6/6  Type 2 diabetes- blood sugars well controlled -Lantus and SSI  Hyperlipidemia- stable -Continue Lipitor  History of stroke -Continue Plavix  COPD- stable, no signs of acute exacerbation. -Continue nebs  Anemia in chronic kidney disease- hemoglobin low but stable -EPO per nephrology   All the records are reviewed and case discussed with Care Management/Social Workerr. Management plans discussed with the patient, family  and they are in agreement.  CODE STATUS: full  TOTAL TIME TAKING CARE OF THIS PATIENT: 33 minutes.   Berna Spare Mayo M.D on 05/25/2019 at 5:01 PM  Between 7am to 6pm - Pager -  [phone]  After 6pm go to [email or web address] - password [email or web address] [place] [facility]  Office  [phone]  CC: Primary care physician; Smithson, [name], MD   Note: This dictation was prepared with Dragon dictation along with smaller phrase technology. Any transcriptional errors that result from this process are unintentional.

## 2019-05-12 NOTE — Progress Notes (Signed)
[facility]  ROUNDING NOTE   Subjective:  Patient remains critically ill. Still on the ventilator. Problems with line clotting noted. Still on CRRT.   Objective:  Vital signs in last 24 hours:  Temp:  [95.2 F (35.1 C)-99 F (37.2 C)] 97.7 F (36.5 C) (06/09 1330) Pulse Rate:  [55-100] 76 (06/09 1330) Resp:  [17-26] 26 (06/09 1330) BP: (57-204)/(34-93) 131/65 (06/09 1330) SpO2:  [87 %-95 %] 92 % (06/09 1330) FiO2 (%):  [45 %-60 %] 60 % (06/09 1200) Weight:  [137.6 kg-148.8 kg] 137.6 kg (06/09 0500)  Weight change: 8.2 kg Filed Weights   05/09/2019 0400 05/24/2019 1500 05/26/2019 0500  Weight: (!) 137.8 kg (!) 148.8 kg (!) 137.6 kg    Intake/Output: I/O last 3 completed shifts: In: 2046.6 [I.V.:1705.2; NG/GT:35; IV Piggyback:306.4] Out: 2064 [Other:2064]   Intake/Output this shift:  Total I/O In: 421.8 [I.V.:378.1; IV Piggyback:43.7] Out: 117 [Other:117]  Physical Exam: General:  Critically ill appearing  HEENT ET tube, OG tube  Neck: supple  Lungs:  Bilateral rhonchi, vent assisted  Heart: Regular rate and rhythm  Abdomen:  Soft, nontender, obese  Extremities: + peripheral edema.  Neurologic: Sedated  Skin: warm  Access: thrombosed left arm AVG, rt fem temp cath replaced 6/8    Basic Metabolic Panel: Recent Labs  Lab 05/10/19 0520  05/10/19 1508  05/24/2019 0414 05/19/2019 2212 05/08/2019 0200 06/02/2019 0518 05/08/2019 0936 05/10/2019 1335  NA 138   < > 135   < > 137 135 137 137 137 137  K 5.2*   < > 4.7   < > 3.8 3.9 3.8 3.7 4.0 4.4  CL 101   < > 101   < > 101 101 103 103 102 104  CO2 23   < > 20*   < > 25 21* 24 22 23  20*  GLUCOSE 199*   < > 252*   < > 179* 186* 176* 168* 173* 178*  BUN 38*   < > 39*   < > 34* 41* 41* 42* 46* 45*  CREATININE 5.66*   < > 5.97*   < > 4.35* 4.87* 4.52* 4.51* 4.72* 4.61*  CALCIUM 8.3*   < > 7.8*   < > 8.1* 7.8* 7.9* 7.9* 7.8* 7.7*  MG 2.0  --  2.0  --  1.9  --   --  2.1  --   --   PHOS 6.1*   < >  --    < > 3.7 4.0  3.8 3.7 4.2 4.1   < > = values in this interval not displayed.    Liver Function Tests: Recent Labs  Lab 05/09/2019 2212 05/16/2019 0200 05/26/2019 0518 05/18/2019 0936 05/16/2019 1335  ALBUMIN 2.4* 2.3* 2.2* 2.3* 2.2*   No results for input(s): LIPASE, AMYLASE in the last 168 hours. No results for input(s): AMMONIA in the last 168 hours.  CBC: Recent Labs  Lab 05/06/19 0437 05/09/19 0507 05/10/19 0520 06/01/2019 0414 05/09/2019 0518  WBC 10.9* 16.6* 32.0* 14.7* 15.7*  NEUTROABS  --  12.8*  --   --  13.7*  HGB 8.7* 8.6* 9.0* 7.9* 7.6*  HCT 27.6* 28.6* 30.5* 25.3* 24.1*  MCV 93.2 97.9 99.3 95.8 93.1  PLT 259 304 255 175 163    Cardiac Enzymes: Recent Labs  Lab 05/06/19 0437  TROPONINI 0.03*    BNP: Invalid input(s): POCBNP  CBG: Recent Labs  Lab 05/24/2019 1958 05/06/2019 0003 05/21/2019 0427 05/09/2019 0744 05/12/19 1130  GLUCAP 190* 191*  142* 163* 157*    Microbiology: Results for orders placed or performed during the hospital encounter of 04/14/2019  SARS Coronavirus 2 (CEPHEID - Performed in [place] hospital lab), Hosp Order     Status: None   Collection Time: 04/10/2019  7:31 AM  Result Value Ref Range Status   SARS Coronavirus 2 NEGATIVE NEGATIVE Final    Comment: (NOTE) If result is NEGATIVE SARS-CoV-2 target nucleic acids are NOT DETECTED. The SARS-CoV-2 RNA is generally detectable in upper and lower  respiratory specimens during the acute phase of infection. The lowest  concentration of SARS-CoV-2 viral copies this assay can detect is 250  copies / mL. A negative result does not preclude SARS-CoV-2 infection  and should not be used as the sole basis for treatment or other  patient management decisions.  A negative result may occur with  improper specimen collection / handling, submission of specimen other  than nasopharyngeal swab, presence of viral mutation(s) within the  areas targeted by this assay, and inadequate number of viral copies  (<250 copies /  mL). A negative result must be combined with clinical  observations, patient history, and epidemiological information. If result is POSITIVE SARS-CoV-2 target nucleic acids are DETECTED. The SARS-CoV-2 RNA is generally detectable in upper and lower  respiratory specimens dur ing the acute phase of infection.  Positive  results are indicative of active infection with SARS-CoV-2.  Clinical  correlation with patient history and other diagnostic information is  necessary to determine patient infection status.  Positive results do  not rule out bacterial infection or co-infection with other viruses. If result is PRESUMPTIVE POSTIVE SARS-CoV-2 nucleic acids MAY BE PRESENT.   A presumptive positive result was obtained on the submitted specimen  and confirmed on repeat testing.  While 2019 novel coronavirus  (SARS-CoV-2) nucleic acids may be present in the submitted sample  additional confirmatory testing may be necessary for epidemiological  and / or clinical management purposes  to differentiate between  SARS-CoV-2 and other Sarbecovirus currently known to infect humans.  If clinically indicated additional testing with an alternate test  methodology [phone]) is advised. The SARS-CoV-2 RNA is generally  detectable in upper and lower respiratory sp ecimens during the acute  phase of infection. The expected result is Negative. Fact Sheet for Patients:  StrictlyIdeas.no Fact Sheet for Healthcare Providers: BankingDealers.co.za This test is not yet approved or cleared by the [place] FDA and has been authorized for detection and/or diagnosis of SARS-CoV-2 by FDA under an Emergency Use Authorization (EUA).  This EUA will remain in effect (meaning this test can be used) for the duration of the COVID-19 declaration under Section 564(b)(1) of the Act, 21 U.S.C. section 360bbb-3(b)(1), unless the authorization is terminated or revoked  sooner. Performed at [facility], [address] [place], [place] [address]   MRSA PCR Screening     Status: None   Collection Time: 04/30/2019  9:04 AM  Result Value Ref Range Status   MRSA by PCR NEGATIVE NEGATIVE Final    Comment:        The GeneXpert MRSA Assay (FDA approved for NASAL specimens only), is one component of a comprehensive MRSA colonization surveillance program. It is not intended to diagnose MRSA infection nor to guide or monitor treatment for MRSA infections. Performed at [facility], [address] [place], [place] [address]   Culture, respiratory (non-expectorated)     Status: None   Collection Time: 05/01/19 11:05 AM  Result Value Ref  Range Status   Specimen Description   Final    TRACHEAL ASPIRATE Performed at [facility], [address] [place], [place] [address]    Special Requests   Final    NONE Performed at [facility], [address] [place], [place] [address]    Gram Stain   Final    MODERATE WBC PRESENT, PREDOMINANTLY PMN RARE YEAST Performed at [facility], [place] [address], [place], [place] [address]    Culture FEW CANDIDA ALBICANS FEW CANDIDA TROPICALIS   Final   Report Status 05/04/2019 FINAL  Final  Culture, respiratory     Status: None   Collection Time: 05/09/19  6:30 PM  Result Value Ref Range Status   Specimen Description   Final    INDUCED SPUTUM Performed at [facility], [address] [place], [place] [address]    Special Requests   Final    NONE Performed at [facility], [address] [place], [place] [address]    Gram Stain   Final    MODERATE WBC PRESENT, PREDOMINANTLY PMN FEW GRAM NEGATIVE RODS Performed at [facility], [place] [address], [place], [place] [address]    Culture MODERATE PSEUDOMONAS AERUGINOSA  Final   Report Status 05/24/2019 FINAL  Final   Organism ID, Bacteria PSEUDOMONAS AERUGINOSA  Final      Susceptibility    Pseudomonas aeruginosa - MIC*    CEFTAZIDIME 2 SENSITIVE Sensitive     CIPROFLOXACIN <=0.25 SENSITIVE Sensitive     GENTAMICIN <=1 SENSITIVE Sensitive     IMIPENEM 2 SENSITIVE Sensitive     PIP/TAZO <=4 SENSITIVE Sensitive     CEFEPIME <=1 SENSITIVE Sensitive     * MODERATE PSEUDOMONAS AERUGINOSA    Coagulation Studies: No results for input(s): LABPROT, INR in the last 72 hours.  Urinalysis: No results for input(s): COLORURINE, LABSPEC, PHURINE, GLUCOSEU, HGBUR, BILIRUBINUR, KETONESUR, PROTEINUR, UROBILINOGEN, NITRITE, LEUKOCYTESUR in the last 72 hours.  Invalid input(s): APPERANCEUR    Imaging: No results found.   Medications:   . sodium chloride 200 mL (05/10/19 1200)  . dexmedetomidine (PRECEDEX) IV infusion 0.9 mcg/kg/hr (05/13/2019 1249)  . famotidine (PEPCID) IV Stopped (05/04/2019 0953)  . fentaNYL infusion INTRAVENOUS 200 mcg/hr (05/25/2019 1249)  . heparin Stopped (05/15/2019 0530)  . norepinephrine (LEVOPHED) Adult infusion 3 mcg/min (05/29/2019 1249)  . piperacillin-tazobactam (ZOSYN)  IV 3.375 g (05/22/2019 1330)  . pureflow 1,500 mL/hr at 05/19/2019 0300  . vasopressin (PITRESSIN) infusion - *FOR SHOCK* Stopped (05/10/19 1634)   . allopurinol  150 mg Per Tube Once per day on Tue Thu Sat  . amitriptyline  25 mg Per Tube QHS  . atorvastatin  40 mg Per Tube Daily  . budesonide (PULMICORT) nebulizer solution  0.5 mg Nebulization BID  . Chlorhexidine Gluconate Cloth  6 each Topical Daily  . citalopram  20 mg Per Tube Daily  . clopidogrel  75 mg Per Tube Daily  . epoetin (EPOGEN/PROCRIT) injection  10,000 Units Intravenous Q T,Th,Sa-HD  . heparin injection (subcutaneous)  5,000 Units Subcutaneous Q8H  . hydrocortisone sod succinate (SOLU-CORTEF) inj  50 mg Intravenous Q6H  . insulin aspart  0-9 Units Subcutaneous Q4H  . insulin aspart  3 Units Subcutaneous Q4H  . insulin glargine  5 Units Subcutaneous QHS  . ipratropium-albuterol  3 mL Nebulization Q4H  . mouth rinse  15 mL  Mouth Rinse 10 times per day  . midodrine  10 mg Per Tube TID WC  .  multivitamin  15 mL Per Tube Daily  . senna-docusate  1 tablet Per Tube BID  . sodium chloride flush  10-40 mL Intracatheter Q12H   albuterol, fentaNYL, fentaNYL (SUBLIMAZE) injection, heparin, lidocaine-prilocaine, LORazepam, midazolam, nystatin, polyethylene glycol, polyvinyl alcohol, sodium chloride flush, vecuronium  Assessment/ Plan:  Ms. [name] is a 63 y.o. black female with end stage renal disease on hemodialysis, hypertension, CVA, obstructive sleep apnea, peripheral vascular disease, hypertension, gout, GERD, diabetes mellitus type II, COPD, coronary artery disease, congestive heart failure, who  was admitted to [facility] on 04/19/2019 for pneumonia. Acute respiratory failure requiring mechanical ventilation and vasopressors.   TTS [facility] left AVG   1. End stage renal disease  With generalized edema/volume overload Complication of dialysis device, clotted AVG.  Started on CRRT June 6 -Continue CRRT at this time.  Catheter clotting issues noted periodically.  2. Anemia of chronic kidney disease:  Lab Results  Component Value Date   HGB 7.6 (L) 05/23/2019   - EPO with HD treatment TTS, consider blood transfusion for hemoglobin of 7 or less.  3. Hypotension:  -Maintain map of 65 or greater.  4. Acute respiratory failure with cardiopulmonary arrest. Extubated 6/2, re-intubated 6/5 FiO2 percentage now down.  Continue ventilatory support.    LOS: 14 Munsoor Lateef 6/9/20202:01 PM

## 2019-05-12 NOTE — Anesthesia Preprocedure Evaluation (Deleted)
Anesthesia Evaluation    Airway        Dental   Pulmonary former smoker,           Cardiovascular hypertension,      Neuro/Psych    GI/Hepatic   Endo/Other  diabetes  Renal/GU      Musculoskeletal   Abdominal   Peds  Hematology   Anesthesia Other Findings Past Medical History: No date: Anemia No date: Anxiety No date: Arthritis No date: Cancer [facility])     Comment:  Left Kidney Cancer No date: CHF (congestive heart failure) (HCC) No date: Chronic kidney disease No date: COPD (chronic obstructive pulmonary disease) (HCC)     Comment:  Use Oxygen at bedtime No date: Coronary artery disease No date: Diabetes mellitus without complication (HCC) No date: Dialysis patient ([place])     Comment:  Tues, Thurs, Sat No date: Dyspnea     Comment:  with exertion No date: GERD (gastroesophageal reflux disease) No date: Gout No date: History of kidney stones No date: Hypertension No date: Neuropathy No date: Peripheral vascular disease ([place]) No date: Sleep apnea     Comment:  OSA--USE C-PAP 2004: Stroke [facility])   Reproductive/Obstetrics                             Anesthesia Physical Anesthesia Plan  ASA:   Anesthesia Plan:    Post-op Pain Management:    Induction:   PONV Risk Score and Plan:   Airway Management Planned:   Additional Equipment:   Intra-op Plan:   Post-operative Plan:   Informed Consent:   Plan Discussed with:   Anesthesia Plan Comments: (Patient did not tolerate trial FiO2 wean in ICU to appropriate FiO2 required for case )       Anesthesia Quick Evaluation

## 2019-05-12 NOTE — Consult Note (Signed)
PHARMACY CONSULT NOTE - FOLLOW UP  Pharmacy Consult for Electrolyte Monitoring and Replacement   Recent Labs: Potassium (mmol/L)  Date Value  05/13/2019 4.4  03/31/2015 4.2   Magnesium (mg/dL)  Date Value  05/18/2019 2.1  06/14/2014 1.8   Calcium (mg/dL)  Date Value  05/19/2019 7.7 (L)   Calcium, Total (mg/dL)  Date Value  03/31/2015 7.0 (LL)   Albumin (g/dL)  Date Value  05/26/2019 2.2 (L)  03/31/2015 2.3 (L)   Phosphorus (mg/dL)  Date Value  05/30/2019 4.1  03/31/2015 3.6   Sodium (mmol/L)  Date Value  05/08/2019 137  03/31/2015 129 (L)   Corrected Ca: 9.14 mg/dL   Assessment: 63 year old female patient with history of CVA, obstructive sleep apnea on CPAP, ESRD on hemodialysis Tuesday, Thursday, Saturday, congestive heart failure, COPD comes in because of worsening shortness of breath, she was called in the emergency room, had PEA, intubated, admitted to intensive care unit. Temporary HD cath placed 6/5, CRRT started 6/6.  Goal of Therapy:  Replace electrolytes conservatively given CRRT  K ~ 3.5 mmol/L Magnesium ~ 1.7 mg/dL  Plan:  --no electrolyte replacement warranted at this time --labs are scheduled every 4 hours: next lab [place] ,PharmD Clinical Pharmacist 05/05/2019 3:14 PM

## 2019-05-12 NOTE — Consult Note (Signed)
Pharmacy Antibiotic Note  [name] is a 63 y.o. female admitted on 04/20/2019 with pneumonia.  Pharmacy has been consulted for Zosyn dosing. Patient is receiving CRRT. Antibiotics were stopped 6/2 and aspiration PNA was suspected following re-intubation on 6/5, when Zosyn was started. This is day #4 of Zosyn. Leukocytosis improved initially but is now trending up. Right femoral dialysis catheter replaced 6/8 and CRRT restarted  Plan: Continue Zosyn 3.375g IV q8h (4 hour infusion).   Height: 5' 5.98" (167.6 cm) Weight: (!) 303 lb 5.7 oz (137.6 kg) IBW/kg (Calculated) : 59.26  Temp (24hrs), Avg:98.5 F (36.9 C), Min:97.4 F (36.3 C), Max:99.8 F (37.7 C)  Recent Labs  Lab 05/06/19 0437  05/09/19 0507  05/10/19 0520  05/10/19 2357 05/09/2019 0414 05/18/2019 2212 05/13/2019 0200 05/21/2019 0518  WBC 10.9*  --  16.6*  --  32.0*  --   --  14.7*  --   --  15.7*  CREATININE 5.41*   < > 6.20*   < > 5.66*   < > 4.43* 4.35* 4.87* 4.52* 4.51*   < > = values in this interval not displayed.    Estimated Creatinine Clearance: 18 mL/min (A) (by C-G formula based on SCr of 4.51 mg/dL (H)).    Antimicrobials this admission:  Zosyn 6/6>> Vanc 5/26 >> 5/27 Cefepime 5/26 >> 5/29 Azith 5/29 >> 6/2 Doxy 5/29 >> 6/2  Microbiology results:  5/26 MRSA PCR: neg 5/29 RCx few C albicans, C tropicalis  Thank you for allowing pharmacy to be a part of this patient's care.  Dallie Piles, PharmD 05/30/2019 7:15 AM

## 2019-05-12 NOTE — Progress Notes (Signed)
*  PRELIMINARY RESULTS* Echocardiogram 2D Echocardiogram has been performed.  [name] 05/16/2019, 1:24 PM

## 2019-05-12 NOTE — Progress Notes (Signed)
Assisted tele visit to patient with daughter, [name].  Maryelizabeth Rowan, RN

## 2019-05-13 ENCOUNTER — Inpatient Hospital Stay: Payer: Medicare Other

## 2019-05-13 LAB — RENAL FUNCTION PANEL
Albumin: 2.4 g/dL — ABNORMAL LOW (ref 3.5–5.0)
Albumin: 2.1 g/dL — ABNORMAL LOW (ref 3.5–5.0)
Albumin: 2.3 g/dL — ABNORMAL LOW (ref 3.5–5.0)
Albumin: 2.4 g/dL — ABNORMAL LOW (ref 3.5–5.0)
Albumin: 2.3 g/dL — ABNORMAL LOW (ref 3.5–5.0)
Anion gap: 10 (ref 5–15)
Anion gap: 10 (ref 5–15)
Anion gap: 10 (ref 5–15)
Anion gap: 8 (ref 5–15)
Anion gap: 10 (ref 5–15)
BUN: 46 mg/dL — ABNORMAL HIGH (ref 8–23)
BUN: 48 mg/dL — ABNORMAL HIGH (ref 8–23)
BUN: 49 mg/dL — ABNORMAL HIGH (ref 8–23)
BUN: 49 mg/dL — ABNORMAL HIGH (ref 8–23)
BUN: 50 mg/dL — ABNORMAL HIGH (ref 8–23)
CO2: 23 mmol/L (ref 22–32)
CO2: 23 mmol/L (ref 22–32)
CO2: 23 mmol/L (ref 22–32)
CO2: 25 mmol/L (ref 22–32)
CO2: 23 mmol/L (ref 22–32)
Calcium: 7.9 mg/dL — ABNORMAL LOW (ref 8.9–10.3)
Calcium: 7.6 mg/dL — ABNORMAL LOW (ref 8.9–10.3)
Calcium: 7.8 mg/dL — ABNORMAL LOW (ref 8.9–10.3)
Calcium: 7.9 mg/dL — ABNORMAL LOW (ref 8.9–10.3)
Calcium: 7.7 mg/dL — ABNORMAL LOW (ref 8.9–10.3)
Chloride: 103 mmol/L (ref 98–111)
Chloride: 104 mmol/L (ref 98–111)
Chloride: 104 mmol/L (ref 98–111)
Chloride: 104 mmol/L (ref 98–111)
Chloride: 102 mmol/L (ref 98–111)
Creatinine, Ser: 4.06 mg/dL — ABNORMAL HIGH (ref 0.44–1.00)
Creatinine, Ser: 4.2 mg/dL — ABNORMAL HIGH (ref 0.44–1.00)
Creatinine, Ser: 3.98 mg/dL — ABNORMAL HIGH (ref 0.44–1.00)
Creatinine, Ser: 3.73 mg/dL — ABNORMAL HIGH (ref 0.44–1.00)
Creatinine, Ser: 3.58 mg/dL — ABNORMAL HIGH (ref 0.44–1.00)
GFR calc Af Amer: 13 mL/min — ABNORMAL LOW (ref >60)
GFR calc Af Amer: 12 mL/min — ABNORMAL LOW (ref >60)
GFR calc Af Amer: 13 mL/min — ABNORMAL LOW (ref >60)
GFR calc Af Amer: 14 mL/min — ABNORMAL LOW (ref >60)
GFR calc Af Amer: 15 mL/min — ABNORMAL LOW (ref >60)
GFR calc non Af Amer: 11 mL/min — ABNORMAL LOW (ref >60)
GFR calc non Af Amer: 10 mL/min — ABNORMAL LOW (ref >60)
GFR calc non Af Amer: 11 mL/min — ABNORMAL LOW (ref >60)
GFR calc non Af Amer: 12 mL/min — ABNORMAL LOW (ref >60)
GFR calc non Af Amer: 13 mL/min — ABNORMAL LOW (ref >60)
Glucose, Bld: 192 mg/dL — ABNORMAL HIGH (ref 70–99)
Glucose, Bld: 263 mg/dL — ABNORMAL HIGH (ref 70–99)
Glucose, Bld: 238 mg/dL — ABNORMAL HIGH (ref 70–99)
Glucose, Bld: 210 mg/dL — ABNORMAL HIGH (ref 70–99)
Glucose, Bld: 251 mg/dL — ABNORMAL HIGH (ref 70–99)
Phosphorus: 3.1 mg/dL (ref 2.5–4.6)
Phosphorus: 3.4 mg/dL (ref 2.5–4.6)
Phosphorus: 3 mg/dL (ref 2.5–4.6)
Phosphorus: 2.7 mg/dL (ref 2.5–4.6)
Phosphorus: 3.2 mg/dL (ref 2.5–4.6)
Potassium: 4.2 mmol/L (ref 3.5–5.1)
Potassium: 4.6 mmol/L (ref 3.5–5.1)
Potassium: 4.2 mmol/L (ref 3.5–5.1)
Potassium: 4.3 mmol/L (ref 3.5–5.1)
Potassium: 4.4 mmol/L (ref 3.5–5.1)
Sodium: 136 mmol/L (ref 135–145)
Sodium: 137 mmol/L (ref 135–145)
Sodium: 137 mmol/L (ref 135–145)
Sodium: 137 mmol/L (ref 135–145)
Sodium: 135 mmol/L (ref 135–145)

## 2019-05-13 LAB — CBC
HCT: 22.5 % — ABNORMAL LOW (ref 36.0–46.0)
Hemoglobin: 6.9 g/dL — ABNORMAL LOW (ref 12.0–15.0)
MCH: 30 pg (ref 26.0–34.0)
MCHC: 30.7 g/dL (ref 30.0–36.0)
MCV: 97.8 fL (ref 80.0–100.0)
Platelets: 145 10*3/uL — ABNORMAL LOW (ref 150–400)
RBC: 2.3 MIL/uL — ABNORMAL LOW (ref 3.87–5.11)
RDW: 18.5 % — ABNORMAL HIGH (ref 11.5–15.5)
WBC: 11.2 10*3/uL — ABNORMAL HIGH (ref 4.0–10.5)
nRBC: 7.1 % — ABNORMAL HIGH (ref 0.0–0.2)

## 2019-05-13 LAB — APTT: aPTT: 62 seconds — ABNORMAL HIGH (ref 24–36)

## 2019-05-13 LAB — GLUCOSE, CAPILLARY
Glucose-Capillary: 194 mg/dL — ABNORMAL HIGH (ref 70–99)
Glucose-Capillary: 220 mg/dL — ABNORMAL HIGH (ref 70–99)
Glucose-Capillary: 221 mg/dL — ABNORMAL HIGH (ref 70–99)
Glucose-Capillary: 185 mg/dL — ABNORMAL HIGH (ref 70–99)
Glucose-Capillary: 207 mg/dL — ABNORMAL HIGH (ref 70–99)
Glucose-Capillary: 214 mg/dL — ABNORMAL HIGH (ref 70–99)

## 2019-05-13 LAB — MAGNESIUM: Magnesium: 2 mg/dL (ref 1.7–2.4)

## 2019-05-13 LAB — HEMOGLOBIN AND HEMATOCRIT, BLOOD
HCT: 26.8 % — ABNORMAL LOW (ref 36.0–46.0)
Hemoglobin: 8.4 g/dL — ABNORMAL LOW (ref 12.0–15.0)

## 2019-05-13 LAB — PREPARE RBC (CROSSMATCH)

## 2019-05-13 MED ORDER — CHLORHEXIDINE GLUCONATE 0.12 % MT SOLN
OROMUCOSAL | Status: AC
  Administered 2019-05-13: 08:00:00 15 mL
  Filled 2019-05-13: qty 15

## 2019-05-13 MED ORDER — INSULIN GLARGINE 100 UNIT/ML ~~LOC~~ SOLN
5.0000 [IU] | Freq: Two times a day (BID) | SUBCUTANEOUS | Status: DC
  Administered 2019-05-13 – 2019-05-20 (×14): 5 [IU] via SUBCUTANEOUS
  Filled 2019-05-13 (×15): qty 0.05

## 2019-05-13 MED ORDER — POLYETHYLENE GLYCOL 3350 17 G PO PACK
17.0000 g | PACK | Freq: Every day | ORAL | Status: DC
  Administered 2019-05-13 – 2019-05-14 (×2): 17 g via ORAL
  Filled 2019-05-13 (×2): qty 1

## 2019-05-13 MED ORDER — SODIUM CHLORIDE 0.9% IV SOLUTION
Freq: Once | INTRAVENOUS | Status: AC
  Administered 2019-05-13: 11:00:00 via INTRAVENOUS

## 2019-05-13 MED ORDER — INSULIN ASPART 100 UNIT/ML ~~LOC~~ SOLN
4.0000 [IU] | SUBCUTANEOUS | Status: DC
  Administered 2019-05-13 – 2019-05-20 (×23): 4 [IU] via SUBCUTANEOUS
  Filled 2019-05-13 (×23): qty 1

## 2019-05-13 NOTE — Progress Notes (Signed)
Follow up - Critical Care Medicine Note  Patient Details:    [name] is an 63 y.o. female.morbidly obese,with severe end stage renal disease s/p cardiac arrest and hypothermia protocol with recurrent resp failure from acute diastolic heart failure and aspiration pneumonia  Lines, Airways, Drains: Airway 8 mm (Active)  Secured at (cm) 24 cm 05/26/2019  4:00 PM  Measured From Lips 05/26/2019  4:00 PM  Secured Location Right 05/19/2019  4:00 PM  Secured By [employer] 05/17/2019  4:00 PM  Tube Holder Repositioned Yes 05/28/2019  1:45 PM  Cuff Pressure (cm H2O) 26 cm H2O 05/19/2019  1:45 PM  Site Condition Cool;Dry 05/25/2019  4:00 PM     NG/OG Tube Orogastric Center mouth Xray (Active)  Cm Marking at Nare/Corner of Mouth (if applicable) 57 cm 6/0/1093  4:00 PM  Site Assessment Clean;Dry;Intact 05/25/2019  4:00 PM  Ongoing Placement Verification No change in cm markings or external length of tube from initial placement;No acute changes, not attributed to clinical condition 05/04/2019  4:00 PM  Status Infusing tube feed 05/10/2019  4:00 PM  Intake (mL) 35 mL 05/19/2019  5:03 PM    Anti-infectives:  Anti-infectives (From admission, onward)   Start     Dose/Rate Route Frequency Ordered Stop   05/10/19 0600  piperacillin-tazobactam (ZOSYN) IVPB 3.375 g     3.375 g 12.5 mL/hr over 240 Minutes Intravenous Every 8 hours 05/09/19 2136     05/09/19 2100  piperacillin-tazobactam (ZOSYN) IVPB 3.375 g  Status:  Discontinued     3.375 g 12.5 mL/hr over 240 Minutes Intravenous Every 12 hours 05/09/19 1835 05/09/19 1937   05/09/19 2000  piperacillin-tazobactam (ZOSYN) IVPB 3.375 g  Status:  Discontinued     3.375 g 12.5 mL/hr over 240 Minutes Intravenous Every 12 hours 05/09/19 1937 05/09/19 2136   05/04/2019 0215  ceFAZolin (ANCEF) IVPB 1 g/50 mL premix     1 g 100 mL/hr over 30 Minutes Intravenous  Once 05/29/2019 0210 05/26/2019 0404   05/01/19 1200  azithromycin (ZITHROMAX) 500 mg in sodium chloride  0.9 % 250 mL IVPB  Status:  Discontinued     500 mg 250 mL/hr over 60 Minutes Intravenous Every 24 hours 05/01/19 0940 05/05/19 0915   05/01/19 1000  doxycycline (VIBRAMYCIN) 100 mg in sodium chloride 0.9 % 250 mL IVPB  Status:  Discontinued     100 mg 125 mL/hr over 120 Minutes Intravenous Every 12 hours 05/01/19 0940 05/05/19 0915   04/30/19 2200  ceFEPIme (MAXIPIME) 2 g in sodium chloride 0.9 % 100 mL IVPB  Status:  Discontinued     2 g 200 mL/hr over 30 Minutes Intravenous Every 24 hours 04/30/19 0708 04/30/19 1431   04/30/19 2200  ceFEPIme (MAXIPIME) 2 g in sodium chloride 0.9 % 100 mL IVPB  Status:  Discontinued     2 g 200 mL/hr over 30 Minutes Intravenous Every 12 hours 04/30/19 1431 05/01/19 0940   04/29/19 1800  ceFEPIme (MAXIPIME) 1 g in sodium chloride 0.9 % 100 mL IVPB  Status:  Discontinued     1 g 200 mL/hr over 30 Minutes Intravenous Every 24 hours 04/26/2019 1156 04/24/2019 1340   04/29/19 1200  vancomycin (VANCOCIN) IVPB 1000 mg/200 mL premix  Status:  Discontinued     1,000 mg 200 mL/hr over 60 Minutes Intravenous Every 24 hours 04/04/2019 1340 04/03/2019 1853   04/20/2019 2200  ceFEPIme (MAXIPIME) 2 g in sodium chloride 0.9 % 100 mL IVPB  Status:  Discontinued     2 g 200 mL/hr over 30 Minutes Intravenous Every 12 hours 04/18/2019 1340 04/30/19 0708   04/23/2019 1200  vancomycin (VANCOCIN) IVPB 1000 mg/200 mL premix  Status:  Discontinued     1,000 mg 200 mL/hr over 60 Minutes Intravenous  Once 05/01/2019 1153 04/07/2019 1853   04/22/2019 0830  vancomycin (VANCOCIN) IVPB 1000 mg/200 mL premix     1,000 mg 200 mL/hr over 60 Minutes Intravenous  Once 04/25/2019 0820 05/01/2019 1200   04/14/2019 0830  ceFEPIme (MAXIPIME) 2 g in sodium chloride 0.9 % 100 mL IVPB     2 g 200 mL/hr over 30 Minutes Intravenous  Once 04/03/2019 0820 04/21/2019 1022      Microbiology: Results for orders placed or performed during the hospital encounter of 04/13/2019  SARS Coronavirus 2 (CEPHEID - Performed in [place] hospital lab), Hosp Order     Status: None   Collection Time: 04/21/2019  7:31 AM  Result Value Ref Range Status   SARS Coronavirus 2 NEGATIVE NEGATIVE Final    Comment: (NOTE) If result is NEGATIVE SARS-CoV-2 target nucleic acids are NOT DETECTED. The SARS-CoV-2 RNA is generally detectable in upper and lower  respiratory specimens during the acute phase of infection. The lowest  concentration of SARS-CoV-2 viral copies this assay can detect is 250  copies / mL. A negative result does not preclude SARS-CoV-2 infection  and should not be used as the sole basis for treatment or other  patient management decisions.  A negative result may occur with  improper specimen collection / handling, submission of specimen other  than nasopharyngeal swab, presence of viral mutation(s) within the  areas targeted by this assay, and inadequate number of viral copies  (<250 copies / mL). A negative result must be combined with clinical  observations, patient history, and epidemiological information. If result is POSITIVE SARS-CoV-2 target nucleic acids are DETECTED. The SARS-CoV-2 RNA is generally detectable in upper and lower  respiratory specimens dur ing the acute phase of infection.  Positive  results are indicative of active infection with SARS-CoV-2.  Clinical  correlation with patient history and other diagnostic information is  necessary to determine patient infection status.  Positive results do  not rule out bacterial infection or co-infection with other viruses. If result is PRESUMPTIVE POSTIVE SARS-CoV-2 nucleic acids MAY BE PRESENT.   A presumptive positive result was obtained on the submitted specimen  and confirmed on repeat testing.  While 2019 novel coronavirus  (SARS-CoV-2) nucleic acids may be present in the submitted sample  additional confirmatory testing may be necessary for epidemiological  and / or clinical management purposes  to differentiate between  SARS-CoV-2 and  other Sarbecovirus currently known to infect humans.  If clinically indicated additional testing with an alternate test  methodology [phone]) is advised. The SARS-CoV-2 RNA is generally  detectable in upper and lower respiratory sp ecimens during the acute  phase of infection. The expected result is Negative. Fact Sheet for Patients:  StrictlyIdeas.no Fact Sheet for Healthcare Providers: BankingDealers.co.za This test is not yet approved or cleared by the [place] FDA and has been authorized for detection and/or diagnosis of SARS-CoV-2 by FDA under an Emergency Use Authorization (EUA).  This EUA will remain in effect (meaning this test can be used) for the duration of the COVID-19 declaration under Section 564(b)(1) of the Act, 21 U.S.C. section 360bbb-3(b)(1), unless the authorization is terminated or revoked sooner. Performed at [facility], [address],  [place], [place] [address]   MRSA PCR Screening     Status: None   Collection Time: 04/11/2019  9:04 AM  Result Value Ref Range Status   MRSA by PCR NEGATIVE NEGATIVE Final    Comment:        The GeneXpert MRSA Assay (FDA approved for NASAL specimens only), is one component of a comprehensive MRSA colonization surveillance program. It is not intended to diagnose MRSA infection nor to guide or monitor treatment for MRSA infections. Performed at [facility], [address] [place], [place] [address]   Culture, respiratory (non-expectorated)     Status: None   Collection Time: 05/01/19 11:05 AM  Result Value Ref Range Status   Specimen Description   Final    TRACHEAL ASPIRATE Performed at [facility], [address] [place], [place] [address]    Special Requests   Final    NONE Performed at [facility], [address] [place], [place] [address]    Gram Stain   Final    MODERATE WBC PRESENT, PREDOMINANTLY PMN RARE  YEAST Performed at [facility], [place] [address] [place], [place] [address]    Culture FEW CANDIDA ALBICANS FEW CANDIDA TROPICALIS   Final   Report Status 05/04/2019 FINAL  Final  Culture, respiratory     Status: None   Collection Time: 05/09/19  6:30 PM  Result Value Ref Range Status   Specimen Description   Final    INDUCED SPUTUM Performed at [facility], [address], [place], [place] [address]    Special Requests   Final    NONE Performed at [facility], [address] [place], [place] [address]    Gram Stain   Final    MODERATE WBC PRESENT, PREDOMINANTLY PMN FEW GRAM NEGATIVE RODS Performed at [facility], [place] [address] [place], [place] [address]    Culture MODERATE PSEUDOMONAS AERUGINOSA  Final   Report Status 05/07/2019 FINAL  Final   Organism ID, Bacteria PSEUDOMONAS AERUGINOSA  Final      Susceptibility   Pseudomonas aeruginosa - MIC*    CEFTAZIDIME 2 SENSITIVE Sensitive     CIPROFLOXACIN <=0.25 SENSITIVE Sensitive     GENTAMICIN <=1 SENSITIVE Sensitive     IMIPENEM 2 SENSITIVE Sensitive     PIP/TAZO <=4 SENSITIVE Sensitive     CEFEPIME <=1 SENSITIVE Sensitive     * MODERATE PSEUDOMONAS AERUGINOSA    Best Practice/Protocols:  VTE Prophylaxis: Heparin (drip) GI Prophylaxis: Antihistamine   Continous Sedation Hyperglycemia (ICU)  Events: 5/26 ICU admission for resp failure and cardiac arrest 5/26 hypothermia protocol 6/3 successfully extubated 6/5 re-admitted to the ICU for aspiration pneumonia and progressive diastolic heart failure and renal failure 6/6 plan for [facility], ENT consulted, remains critically ill on vent 6/6 near cardiac arrest, fio2 100%, PEEP 15 6/7 severe resp failure, multiorgan failure 6/9 declined for trach due to high FiO2 requirements  Studies: Dg Abd 1 View  Result Date: 05/16/2019 CLINICAL DATA:  OG tube placement. EXAM: ABDOMEN - 1 VIEW COMPARISON:  04/09/2019. FINDINGS: OG tube noted with  tip below left hemidiaphragm. Severely distended loops of bowel are noted. Abdominal series suggested for further evaluation. No definite free air noted. IMPRESSION: NG tube noted with its tip under the left hemidiaphragm. Severely distended loops of bowel are noted. Abdominal series suggested for further evaluation. Electronically Signed   By: Marcello Moores  Register   On: 05/05/2019 15:50   Dg Chest Port 1 [address]  Result Date: 05/13/2019 CLINICAL DATA:  Respiratory failure EXAM: PORTABLE CHEST 1 VIEW COMPARISON:  05/10/2019 FINDINGS: Endotracheal tube and nasogastric catheter are noted in satisfactory position. Hero graft is again seen and unchanged. Cardiac shadow is stable. The overall inspiratory effort is poor with elevation of the right hemidiaphragm. Small effusions are noted bilaterally. Mild bibasilar atelectasis is again seen. No new focal abnormality is noted. IMPRESSION: Stable appearance of the chest with small effusions and bibasilar atelectasis. Electronically Signed   By: Inez Catalina M.D.   On: 05/13/2019 07:22   Dg Chest Port 1 View  Result Date: 05/10/2019 CLINICAL DATA:  Acute respiratory failure. EXAM: PORTABLE CHEST 1 VIEW COMPARISON:  Chest radiograph 05/09/2019 FINDINGS: ET tube terminates in the mid trachea. Enteric tube courses inferior to the diaphragm. Central venous catheter tip projects over the superior vena cava. Stable cardiomegaly. Elevation right hemidiaphragm. Similar-appearing bilateral mid lower lung heterogeneous opacities. Probable small left pleural effusion. IMPRESSION: Similar-appearing mid and lower lung airspace opacities. Possible small left pleural effusion. Electronically Signed   By: Lovey Newcomer M.D.   On: 05/10/2019 06:47   Dg Chest Port 1 View  Result Date: 05/09/2019 CLINICAL DATA:  Congestive heart failure.  Hypoxia.  Ex-smoker. EXAM: PORTABLE CHEST 1 VIEW COMPARISON:  05/09/2019 at 0552 hours. FINDINGS: 1817 hours. Large bore left-sided central line terminates  at the high SVC. Endotracheal tube is difficult to visualize centrally but likely terminates 2.9 cm above carina. Nasogastric tube extends beyond the inferior aspect of the film. Mildly degraded exam due to AP portable technique and patient body habitus. Normal heart size for level of inspiration. Right costophrenic angle minimally excluded. moderate right hemidiaphragm elevation with extremely low lung volumes. No overt congestive heart failure. Suspect residual right base airspace disease. IMPRESSION: Cardiomegaly and extremely low lung volumes. Probable residual right base airspace disease/atelectasis. Electronically Signed   By: Abigail Miyamoto M.D.   On: 05/09/2019 18:59   Dg Chest Port 1 View  Result Date: 05/09/2019 CLINICAL DATA:  Acute respiratory failure EXAM: PORTABLE CHEST 1 VIEW COMPARISON:  05/22/2019 FINDINGS: Endotracheal tube tip is just above the level of the carina. Retraction by 2-3 cm would place it at the level of the clavicular heads. Enteric tube side port projects over the stomach. Left IJ large bore central venous catheter is unchanged. Bibasilar atelectasis. IMPRESSION: Endotracheal tube tip just above the level of the carina. Retraction by 2-3 cm is recommended. Electronically Signed   By: Ulyses Jarred M.D.   On: 05/09/2019 06:21   Dg Chest Port 1 View  Result Date: 05/12/2019 CLINICAL DATA:  Intubation EXAM: PORTABLE CHEST 1 VIEW COMPARISON:  Portable exam 1454 hours compared to 05/07/2019 FINDINGS: Tip of endotracheal tube projects 2.1 cm above carina. Nasogastric tube extends into abdomen. Rotated to the RIGHT. LEFT jugular large-bore central venous catheter with tip projecting over SVC. Enlargement of cardiac silhouette with pulmonary vascular congestion. Probable pulmonary edema and layered RIGHT pleural effusion IMPRESSION: Tip of endotracheal tube projects 2.1 cm above carina. Question pulmonary edema and layering RIGHT pleural effusion. Electronically Signed   By: Lavonia Dana  M.D.   On: 05/23/2019 15:49   Dg Chest Port 1 View  Result Date: 05/07/2019 CLINICAL DATA:  Shortness of breath. EXAM: PORTABLE CHEST 1 VIEW COMPARISON:  Radiographs of May 06, 2019. FINDINGS: Stable cardiomediastinal silhouette. Left internal jugular catheter is unchanged. No pneumothorax or significant pleural effusion is noted. Minimal bibasilar subsegmental atelectasis is noted. Bony thorax is unremarkable. IMPRESSION: Minimal bibasilar subsegmental  atelectasis. Electronically Signed   By: Marijo Conception M.D.   On: 05/07/2019 09:55   Dg Chest Port 1 View  Result Date: 05/06/2019 CLINICAL DATA:  Shortness of breath EXAM: PORTABLE CHEST 1 VIEW COMPARISON:  Two days ago FINDINGS: Cardiomegaly and vascular pedicle widening. Very low volume chest with hazy opacity on both sides. Dialysis catheter from the left with tip near the SVC origin. Trachea and esophagus have been extubated. IMPRESSION: Unchanged very low lung volumes with atelectasis and vascular congestion. There may be layering pleural effusions. Electronically Signed   By: Monte Fantasia M.D.   On: 05/06/2019 05:31   Dg Chest Port 1 View  Result Date: 05/04/2019 CLINICAL DATA:  Acute respiratory failure EXAM: PORTABLE CHEST 1 VIEW COMPARISON:  05/01/2019 FINDINGS: Endotracheal tube, gastric catheter and hero graft are again noted and stable. Cardiac shadow is stable. Right upper lobe atelectasis is noted along the minor fissure new from the prior exam. Some improved aeration in right base is noted when compare with the prior exam. Mild vascular congestion is noted. Small left effusion is noted. IMPRESSION: Improved aeration in the right base although some right upper lobe atelectasis is noted. Mild vascular congestion. Electronically Signed   By: Inez Catalina M.D.   On: 05/04/2019 07:17   Dg Chest Port 1 View  Result Date: 05/01/2019 CLINICAL DATA:  Shortness of breath. EXAM: PORTABLE CHEST 1 VIEW COMPARISON:  Chest x-ray from same day at  3:54 a.m. FINDINGS: The patient remains rotated to the right. Unchanged endotracheal and enteric tubes. Unchanged left HeRO graft. Stable cardiomegaly. Normal pulmonary vascularity. Chronic elevation of the right hemidiaphragm with right basilar atelectasis/scarring. Unchanged hazy opacity at the left lung base likely reflecting a combination of atelectasis and small left pleural effusion. No pneumothorax. No acute osseous abnormality. IMPRESSION: 1. Stable chest with bibasilar atelectasis and probable small left pleural effusion. Electronically Signed   By: Titus Dubin M.D.   On: 05/01/2019 10:20   Dg Chest Port 1 View  Result Date: 05/01/2019 CLINICAL DATA:  Acute respiratory failure. EXAM: PORTABLE CHEST 1 VIEW COMPARISON:  04/29/2019. FINDINGS: Endotracheal tube, NG tube, large caliber left subclavian line in stable position. Stable cardiomegaly. Bilateral pulmonary infiltrates/edema slightly progressed from prior exam. Bibasilar atelectasis particular prominent right lung base. Bilateral pleural effusions cannot be excluded. No pneumothorax. IMPRESSION: 1.  Lines and tubes stable position. 2. Cardiomegaly unchanged. Diffuse bilateral pulmonary infiltrates/edema slightly progressed from prior exam. Bibasilar atelectasis particular prominent the right lung base. Bilateral pleural effusions cannot be excluded. Electronically Signed   By: Marcello Moores  Register   On: 05/01/2019 06:15   Dg Chest Port 1 View  Result Date: 04/29/2019 CLINICAL DATA:  Acute respiratory failure. EXAM: PORTABLE CHEST 1 VIEW COMPARISON:  Single-view of the chest earlier today and 04/26/2019. FINDINGS: Support tubes and lines are unchanged since the most recent comparison. There is cardiomegaly without edema. Bibasilar airspace disease has worsened on the right. No pneumothorax. There may be a right pleural effusion. IMPRESSION: No change in support apparatus. Bibasilar airspace disease is worse on the right. There may be a right  pleural effusion. Cardiomegaly. Electronically Signed   By: Inge Rise M.D.   On: 04/29/2019 18:32   Dg Chest Port 1 View  Result Date: 04/29/2019 CLINICAL DATA:  Acute respiratory failure EXAM: PORTABLE CHEST 1 VIEW COMPARISON:  Yesterday FINDINGS: Endotracheal tube tip between the clavicular heads and carina. Left subclavian dialysis catheter with tip at the brachiocephalic SVC confluence. There is an orogastric  tube which at least reaches the stomach. Haziness of the bilateral lower chest with obscuration of the left diaphragm. No Kerley lines, effusion, or pneumothorax. Cardiomegaly.  Rightward rotation. IMPRESSION: 1. Stable hardware positioning. 2. Worsening lower lobe aeration. Electronically Signed   By: Monte Fantasia M.D.   On: 04/29/2019 06:50   Dg Chest Port 1 View  Result Date: 04/22/2019 CLINICAL DATA:  63 year old female history intubation EXAM: PORTABLE CHEST 1 VIEW COMPARISON:  05/02/2019, 03/28/2015 FINDINGS: Cardiomediastinal silhouette unchanged in size and contour with low lung volumes accentuating the diameter of the mediastinum. Asymmetric elevation of the right hemidiaphragm. Coarsened interstitial markings with interlobular septal thickening and thickening of the minor fissure. Patchy opacity in the right lung base again demonstrated. Interval placement of endotracheal tube which terminates 2.5 cm above the carina. Interval placement of gastric tube terminating out of the field of view. Interval placement of defibrillator pads. Left upper extremity HERO graft, with the tip terminating in the region of the superior vena cava. IMPRESSION: Interval placement of endotracheal tube terminating 2.5 cm above the carina. Interval placement of gastric tube terminating in the abdomen out of the field of view. Interval placement of defibrillator pads. Similar appearance of low lung volumes, with background changes of either chronic scarring and/or early pulmonary edema, with similar  appearance airspace disease at the right lung base. Left upper extremity HeRO graft Electronically Signed   By: Corrie Mckusick D.O.   On: 04/18/2019 13:18   Dg Chest Portable 1 View  Result Date: 04/12/2019 CLINICAL DATA:  Short of breath EXAM: PORTABLE CHEST 1 VIEW COMPARISON:  03/28/2015 FINDINGS: New left upper extremity and jugular hair 0 catheter with its tip in the upper SVC. Cardiomegaly. Normal vascularity. Bibasilar opacities likely combination of volume loss and pleural fluid. No pneumothorax. Triangular opacity at the medial right lung base is noted representing either consolidation in the right lower lobe or collapse. IMPRESSION: Right lower lobe collapse versus consolidation. Bibasilar opacities likely combination of pleural fluid and atelectasis. Electronically Signed   By: Marybelle Killings M.D.   On: 04/30/2019 08:04   Dg Abd Portable 1v  Result Date: 04/26/2019 CLINICAL DATA:  OG tube placement. EXAM: PORTABLE ABDOMEN - 1 VIEW COMPARISON:  One-view chest x-ray FINDINGS: OG tube terminates in the stomach. The stomach is mildly distended. The lung bases are clear. IMPRESSION: 1. OG tube terminates in the stomach. Electronically Signed   By: San Morelle M.D.   On: 04/03/2019 13:29    Consults: Treatment Team:  Algernon Huxley, MD [facility], Ander Gaster, MD Beverly Gust, MD   Subjective:    Overnight Issues: Continues to be sedated on the ventilator.  Pressor requirement decreased.  5 O2 requirements continue to be high.  Objective:  Vital signs for last 24 hours: Temp:  [96.3 F (35.7 C)-99 F (37.2 C)] 97.7 F (36.5 C) (06/10 2000) Pulse Rate:  [61-86] 63 (06/10 2100) Resp:  [9-26] 26 (06/10 2100) BP: (74-177)/(40-74) 146/68 (06/10 2100) SpO2:  [82 %-95 %] 93 % (06/10 2100) FiO2 (%):  [60 %-100 %] 100 % (06/10 2025) Weight:  [140.1 kg] 140.1 kg (06/10 0202)  Hemodynamic parameters for last 24 hours:    Intake/Output from previous day: 06/09 0701 - 06/10 0700 In:  2085.8 [I.V.:1294.4; NG/GT:597.3; IV Piggyback:194] Out: 5885   Intake/Output this shift: No intake/output data recorded.  Vent settings for last 24 hours: Vent Mode: PRVC FiO2 (%):  [60 %-100 %] 100 % Set Rate:  [26 bmp] 26 bmp Vt  Set:  [480 mL] 480 mL PEEP:  [12 cmH20-15 cmH20] 15 cmH20 Plateau Pressure:  [25 UUE28-00 cmH20] 25 cmH20  Physical Exam:  GENERAL: Massively obese, intubated, sedated HEAD: Normocephalic, atraumatic.  EYES: Pupils equal, round, reactive to light. No scleral icterus.  MOUTH: Moist membranes. NECK: Very thick neck, supple.  Trachea midline, no crepitus.  PULMONARY: +rhonchi, no wheezing, or rales. CARDIOVASCULAR: S1 and S2. Regular rate and rhythm. No murmurs, rubs, or gallops.  GASTROINTESTINAL: Soft, protuberant.Positive bowel sounds.  MUSCULOSKELETAL: Anasarca evident, no clubbing, no joint deformity.   NEUROLOGIC: obtunded, poorly responsive on wake-up assessment. SKIN:intact,warm,dry  Assessment/Plan:  1.  Acute on chronic hypoxic/hypercarbic respiratory failure: Continue ventilator support.  She has high FiO2 requirements requiring 70% today.  Did not tolerate titration down.  She has been turned down for tracheostomy due to this.  Continue supportive care.  Recruitment maneuvers as hemodynamics allow.  Continue to maintain on higher PEEP.  PEEP at 15.   2.  Aspiration pneumonia: Continue antibiotics.  She will need 7 days of coverage.  3.  Acute on chronic diastolic heart failure:  2D echo with poor acoustic windows.  Continue dialysis in the setting of volume overload.  4.  End-stage renal disease with anasarca: Issue is complicated by clotted AV graft.  Started on CRRT June 6.  Continues on CRRT, and attempt was made to switch to remittent hemodialysis but patient did not tolerate.  Has a right femoral dialysis catheter, access is challenging.  Presence of old HERO catheter on left.  Discussed with Dr. Holley Raring.  5.  Shock: Multifactorial,  sepsis, status post arrest, diastolic dysfunction decompensation, he is currently on stress dose steroids.  Continue supportive care with pressors.  He is currently on norepinephrine but being weaned off actively.  6.  Super morbid obesity: This issue adds extreme complexity to her management  7.  Protocols: As above.  Prognosis remains exceedingly guarded.  She may require transfer to [facility] given her multiple complex issues.    LOS: 15 days   Additional comments: Multidisciplinary rounds were performed with ICU team.  Critical Care Total Time*: 45 Minutes  C. Derrill Kay, MD [place] [facility] 05/13/2019  *Care during the described time interval was provided by me and/or other providers on the critical care team.  I have reviewed this patient's available data, including medical history, events of note, physical examination and test results as part of my evaluation.

## 2019-05-13 NOTE — Progress Notes (Signed)
CRRT restarted with new cartridge at 0800, no issues at this time

## 2019-05-13 NOTE — Progress Notes (Signed)
[facility] [place] NAME: [name]    MR#:  [id number]  DATE OF BIRTH:  June 14, 1956     Patient remains on ventilator and CRRT. Remains unresponsive and critically ill.  REVIEW OF SYSTEMS:   Unable to obtain due to patient being intubated and sedated  DRUG ALLERGIES:   Allergies  Allergen Reactions  . Iodine Rash  . Enalapril Other (See Comments)    TONGUE SWELLS/GOUT  . Iodinated Diagnostic Agents Other (See Comments)    BREAK OUT IN WHELPS      VITALS:  Blood pressure 134/73, pulse 86, temperature 98.8 F (37.1 C), temperature source Axillary, resp. rate (!) 26, height 5' 5.98" (1.676 m), weight (!) 140.1 kg, SpO2 (!) 82 %.  PHYSICAL EXAMINATION:  GENERAL:  63 y.o.-morbidly obese and critically ill-appearing. EYES: Pupils equal, round, reactive to light  No scleral icterus.  HEENT: Head atraumatic, normocephalic.  Currently intubated, sedated. ETT in place. NECK:  Supple, no jugular venous distention. No thyroid enlargement, no tenderness.  LUNGS: +diffuse rhonchi and wheezing present CARDIOVASCULAR: RRR, S1, S2 normal. No murmurs, rubs, or gallops.  ABDOMEN: Soft, nontender, nondistended. Bowel sounds present. No organomegaly or mass.  EXTREMITIES: +pedal edema, no cyanosis or clubbing.  NEUROLOGIC: Unable to do neuro exam because of intubation, sedation. PSYCHIATRIC: Intubated, sedated.   SKIN: No obvious rash, lesion, or ulcer.   LABORATORY PANEL:   CBC Recent Labs  Lab 05/13/19 0558  WBC 11.2*  HGB 6.9*  HCT 22.5*  PLT 145*   ------------------------------------------------------------------------------------------------------------------  Chemistries  Recent Labs  Lab 05/13/19 0558  05/13/19 1348  NA  --    < > 137  K  --    < > 4.3  CL  --    < > 104  CO2  --    < > 25  GLUCOSE  --    < > 210*  BUN  --    < > 49*  CREATININE  --    < > 3.73*  CALCIUM  --    < > 7.9*  MG 2.0  --   --     < > = values in this interval not displayed.   ------------------------------------------------------------------------------------------------------------------  Cardiac Enzymes No results for input(s): TROPONINI in the last 168 hours. ------------------------------------------------------------------------------------------------------------------  RADIOLOGY:  Dg Chest Port 1 View  Result Date: 05/13/2019 CLINICAL DATA:  Respiratory failure EXAM: PORTABLE CHEST 1 VIEW COMPARISON:  05/10/2019 FINDINGS: Endotracheal tube and nasogastric catheter are noted in satisfactory position. Hero graft is again seen and unchanged. Cardiac shadow is stable. The overall inspiratory effort is poor with elevation of the right hemidiaphragm. Small effusions are noted bilaterally. Mild bibasilar atelectasis is again seen. No new focal abnormality is noted. IMPRESSION: Stable appearance of the chest with small effusions and bibasilar atelectasis. Electronically Signed   By: Inez Catalina M.D.   On: 05/13/2019 07:22    EKG:   Orders placed or performed during the hospital encounter of 04/15/2019  . EKG 12-Lead  . EKG 12-Lead  . EKG 12-Lead  . EKG 12-Lead    ASSESSMENT AND PLAN:   63 year old female patient with history of CVA, obstructive sleep apnea on CPAP, ESRD on hemodialysis Tuesday, Thursday, Saturday, congestive heart failure, COPD comes in because of worsening shortness of breath, she was called in the emergency room, had PEA, intubated, admitted to intensive care unit.  Acute hypoxic and hypercapneic respiratory failure- status post PEA arrest. Also with aspiration  pneumonia -Extubated 6/2 and then had to be reintubated 6/5 -Vent management per CCM -ENT planning for trach -Continue Zosyn -Sputum cultures growing pseudomonas  Shock- multi-factorial due to sepsis/hypovolemia/cardiogenic -On multiple pressors -Continue midodrine tid -Abx as above  ESRD- on hemodialysis TTS. Temporary HD  cath placed 6/5. -Nephrology following -CRRT started 6/6  Type 2 diabetes- blood sugars well controlled -Lantus and SSI  Hyperlipidemia- stable -Continue Lipitor  History of stroke -Continue Plavix  COPD- stable, no signs of acute exacerbation. -Continue nebs  Anemia in chronic kidney disease- hemoglobin low but stable -EPO per nephrology   All the records are reviewed and case discussed with Care Management/Social Workerr. Management plans discussed with the patient, family and they are in agreement.  CODE STATUS: full  TOTAL TIME TAKING CARE OF THIS PATIENT: 28 minutes.   Berna Spare Mayo M.D on 05/13/2019 at 2:23 PM  Between 7am to 6pm - Pager [phone]  After 6pm go to [email or web address] - password [email or web address] [facility]  Office  [phone]  CC: Primary care physician; Smithson, [name], MD   Note: This dictation was prepared with Dragon dictation along with smaller phrase technology. Any transcriptional errors that result from this process are unintentional.

## 2019-05-13 NOTE — Progress Notes (Addendum)
CRRT discontinued approx 1800 per Dr Elwyn Lade order.  Rinseback performed and dialysis lumens packed with heparin Levophed aff most of this shift. 1 unit PRBCs transfused. Family updated

## 2019-05-13 NOTE — Progress Notes (Signed)
[facility]  ROUNDING NOTE   Subjective:  Critical illness persist at this time. Patient still on ventilatory support. Has tolerated CRRT well. Ultrafiltration achieved yesterday was 1.4 kg.  Objective:  Vital signs in last 24 hours:  Temp:  [96.3 F (35.7 C)-99.1 F (37.3 C)] 98.8 F (37.1 C) (06/10 1200) Pulse Rate:  [64-83] 64 (06/10 1200) Resp:  [17-26] 26 (06/10 1200) BP: (72-177)/(40-74) 142/65 (06/10 1200) SpO2:  [89 %-95 %] 92 % (06/10 1200) FiO2 (%):  [60 %] 60 % (06/10 1118) Weight:  [140.1 kg] 140.1 kg (06/10 0202)  Weight change: -8.7 kg Filed Weights   05/07/2019 1500 05/21/2019 0500 05/13/19 0202  Weight: (!) 148.8 kg (!) 137.6 kg (!) 140.1 kg    Intake/Output: I/O last 3 completed shifts: In: 2678.2 [I.V.:1767.8; NG/GT:597.3; IV Piggyback:313.1] Out: 2379 [Other:2379]   Intake/Output this shift:  Total I/O In: 681.3 [I.V.:241.6; NG/GT:390; IV Piggyback:49.8] Out: 390 [Other:390]  Physical Exam: General: Critically ill appearing  HEENT ET tube, OG tube  Neck: supple  Lungs:  Bilateral rhonchi, vent assisted  Heart: Regular rate and rhythm  Abdomen:  Soft, nontender, obese  Extremities: + peripheral edema.  Neurologic: Sedated  Skin: warm  Access: thrombosed left arm AVG, rt fem temp cath replaced 6/8    Basic Metabolic Panel: Recent Labs  Lab 05/10/19 0520  05/10/19 1508  05/21/2019 0414  05/30/2019 0518  05/14/2019 1844 05/14/2019 2157 05/13/19 0149 05/13/19 0558 05/13/19 0604 05/13/19 0902  NA 138   < > 135   < > 137   < > 137   < > 136 136 136  --  137 137  K 5.2*   < > 4.7   < > 3.8   < > 3.7   < > 4.8 4.5 4.2  --  4.6 4.2  CL 101   < > 101   < > 101   < > 103   < > 102 103 103  --  104 104  CO2 23   < > 20*   < > 25   < > 22   < > 21* 23 23  --  23 23  GLUCOSE 199*   < > 252*   < > 179*   < > 168*   < > 205* 205* 192*  --  263* 238*  BUN 38*   < > 39*   < > 34*   < > 42*   < > 44* 42* 46*  --  48* 49*  CREATININE 5.66*   < >  5.97*   < > 4.35*   < > 4.51*   < > 4.44* 3.98* 4.06*  --  4.20* 3.98*  CALCIUM 8.3*   < > 7.8*   < > 8.1*   < > 7.9*   < > 7.7* 7.7* 7.9*  --  7.6* 7.8*  MG 2.0  --  2.0  --  1.9  --  2.1  --   --   --   --  2.0  --   --   PHOS 6.1*   < >  --    < > 3.7   < > 3.7   < > 3.9 3.2 3.1  --  3.4 3.0   < > = values in this interval not displayed.    Liver Function Tests: Recent Labs  Lab 05/09/2019 1844 05/12/19 2157 05/13/19 0149 05/13/19 0604 05/13/19 0902  ALBUMIN 2.3* 2.3* 2.4* 2.1* 2.3*  No results for input(s): LIPASE, AMYLASE in the last 168 hours. No results for input(s): AMMONIA in the last 168 hours.  CBC: Recent Labs  Lab 05/09/19 0507 05/10/19 0520 05/18/2019 0414 05/13/2019 0518 05/13/19 0558  WBC 16.6* 32.0* 14.7* 15.7* 11.2*  NEUTROABS 12.8*  --   --  13.7*  --   HGB 8.6* 9.0* 7.9* 7.6* 6.9*  HCT 28.6* 30.5* 25.3* 24.1* 22.5*  MCV 97.9 99.3 95.8 93.1 97.8  PLT 304 255 175 163 145*    Cardiac Enzymes: No results for input(s): CKTOTAL, CKMB, CKMBINDEX, TROPONINI in the last 168 hours.  BNP: Invalid input(s): POCBNP  CBG: Recent Labs  Lab 05/28/2019 2332 05/13/19 0349 05/13/19 0721 05/13/19 0743 05/13/19 1107  GLUCAP 203* 194* 220* 221* 185*    Microbiology: Results for orders placed or performed during the hospital encounter of 05/01/2019  SARS Coronavirus 2 (CEPHEID - Performed in [facility] [place] hospital lab), Hosp Order     Status: None   Collection Time: 04/17/2019  7:31 AM  Result Value Ref Range Status   SARS Coronavirus 2 NEGATIVE NEGATIVE Final    Comment: (NOTE) If result is NEGATIVE SARS-CoV-2 target nucleic acids are NOT DETECTED. The SARS-CoV-2 RNA is generally detectable in upper and lower  respiratory specimens during the acute phase of infection. The lowest  concentration of SARS-CoV-2 viral copies this assay can detect is 250  copies / mL. A negative result does not preclude SARS-CoV-2 infection  and should not be used as the sole basis for  treatment or other  patient management decisions.  A negative result may occur with  improper specimen collection / handling, submission of specimen other  than nasopharyngeal swab, presence of viral mutation(s) within the  areas targeted by this assay, and inadequate number of viral copies  (<250 copies / mL). A negative result must be combined with clinical  observations, patient history, and epidemiological information. If result is POSITIVE SARS-CoV-2 target nucleic acids are DETECTED. The SARS-CoV-2 RNA is generally detectable in upper and lower  respiratory specimens dur ing the acute phase of infection.  Positive  results are indicative of active infection with SARS-CoV-2.  Clinical  correlation with patient history and other diagnostic information is  necessary to determine patient infection status.  Positive results do  not rule out bacterial infection or co-infection with other viruses. If result is PRESUMPTIVE POSTIVE SARS-CoV-2 nucleic acids MAY BE PRESENT.   A presumptive positive result was obtained on the submitted specimen  and confirmed on repeat testing.  While 2019 novel coronavirus  (SARS-CoV-2) nucleic acids may be present in the submitted sample  additional confirmatory testing may be necessary for epidemiological  and / or clinical management purposes  to differentiate between  SARS-CoV-2 and other Sarbecovirus currently known to infect humans.  If clinically indicated additional testing with an alternate test  methodology [phone]) is advised. The SARS-CoV-2 RNA is generally  detectable in upper and lower respiratory sp ecimens during the acute  phase of infection. The expected result is Negative. Fact Sheet for Patients:  StrictlyIdeas.no Fact Sheet for Healthcare Providers: BankingDealers.co.za This test is not yet approved or cleared by the [place] FDA and has been authorized for detection and/or  diagnosis of SARS-CoV-2 by FDA under an Emergency Use Authorization (EUA).  This EUA will remain in effect (meaning this test can be used) for the duration of the COVID-19 declaration under Section 564(b)(1) of the Act, 21 U.S.C. section 360bbb-3(b)(1), unless the authorization is terminated or revoked  sooner. Performed at [facility], [address] [place], [place] [address]   MRSA PCR Screening     Status: None   Collection Time: 04/30/2019  9:04 AM  Result Value Ref Range Status   MRSA by PCR NEGATIVE NEGATIVE Final    Comment:        The GeneXpert MRSA Assay (FDA approved for NASAL specimens only), is one component of a comprehensive MRSA colonization surveillance program. It is not intended to diagnose MRSA infection nor to guide or monitor treatment for MRSA infections. Performed at [facility], [address] [place], [place] [address]   Culture, respiratory (non-expectorated)     Status: None   Collection Time: 05/01/19 11:05 AM  Result Value Ref Range Status   Specimen Description   Final    TRACHEAL ASPIRATE Performed at [facility], [address], [place], [place] [address]    Special Requests   Final    NONE Performed at [facility], [address] [place], [place] [address]    Gram Stain   Final    MODERATE WBC PRESENT, PREDOMINANTLY PMN RARE YEAST Performed at [facility], [place] [address] [place], [place] [address]    Culture FEW CANDIDA ALBICANS FEW CANDIDA TROPICALIS   Final   Report Status 05/04/2019 FINAL  Final  Culture, respiratory     Status: None   Collection Time: 05/09/19  6:30 PM  Result Value Ref Range Status   Specimen Description   Final    INDUCED SPUTUM Performed at [facility], [address] [place], [place] [address]    Special Requests   Final    NONE Performed at [facility], [address] [place], [place] [address]    Gram Stain   Final     MODERATE WBC PRESENT, PREDOMINANTLY PMN FEW GRAM NEGATIVE RODS Performed at [facility], [place] [address], [place],  [address]    Culture MODERATE PSEUDOMONAS AERUGINOSA  Final   Report Status 05/04/2019 FINAL  Final   Organism ID, Bacteria PSEUDOMONAS AERUGINOSA  Final      Susceptibility   Pseudomonas aeruginosa - MIC*    CEFTAZIDIME 2 SENSITIVE Sensitive     CIPROFLOXACIN <=0.25 SENSITIVE Sensitive     GENTAMICIN <=1 SENSITIVE Sensitive     IMIPENEM 2 SENSITIVE Sensitive     PIP/TAZO <=4 SENSITIVE Sensitive     CEFEPIME <=1 SENSITIVE Sensitive     * MODERATE PSEUDOMONAS AERUGINOSA    Coagulation Studies: No results for input(s): LABPROT, INR in the last 72 hours.  Urinalysis: No results for input(s): COLORURINE, LABSPEC, PHURINE, GLUCOSEU, HGBUR, BILIRUBINUR, KETONESUR, PROTEINUR, UROBILINOGEN, NITRITE, LEUKOCYTESUR in the last 72 hours.  Invalid input(s): APPERANCEUR    Imaging: Dg Chest Port 1 View  Result Date: 05/13/2019 CLINICAL DATA:  Respiratory failure EXAM: PORTABLE CHEST 1 VIEW COMPARISON:  05/10/2019 FINDINGS: Endotracheal tube and nasogastric catheter are noted in satisfactory position. Hero graft is again seen and unchanged. Cardiac shadow is stable. The overall inspiratory effort is poor with elevation of the right hemidiaphragm. Small effusions are noted bilaterally. Mild bibasilar atelectasis is again seen. No new focal abnormality is noted. IMPRESSION: Stable appearance of the chest with small effusions and bibasilar atelectasis. Electronically Signed   By: Inez Catalina M.D.   On: 05/13/2019 07:22     Medications:   . sodium chloride 200 mL (05/10/19 1200)  . dexmedetomidine (PRECEDEX) IV infusion 0.8 mcg/kg/hr (05/13/19 1240)  . famotidine (  PEPCID) IV Stopped (05/17/2019 2222)  . fentaNYL infusion INTRAVENOUS 200 mcg/hr (05/13/19 1200)  . heparin Stopped (05/26/2019 0530)  . norepinephrine (LEVOPHED) Adult infusion 1 mcg/min (05/13/19 1200)  .  piperacillin-tazobactam (ZOSYN)  IV Stopped (05/13/19 1103)  . pureflow 1,500 mL/hr at 05/13/19 0458  . vasopressin (PITRESSIN) infusion - *FOR SHOCK* Stopped (05/10/19 1634)   . allopurinol  150 mg Per Tube Once per day on Tue Thu Sat  . amitriptyline  25 mg Per Tube QHS  . atorvastatin  40 mg Per Tube Daily  . budesonide (PULMICORT) nebulizer solution  0.5 mg Nebulization BID  . Chlorhexidine Gluconate Cloth  6 each Topical Daily  . citalopram  20 mg Per Tube Daily  . clopidogrel  75 mg Per Tube Daily  . epoetin (EPOGEN/PROCRIT) injection  10,000 Units Intravenous Q T,Th,Sa-HD  . feeding supplement (PRO-STAT SUGAR FREE 64)  30 mL Per Tube 5 X Daily  . feeding supplement (VITAL HIGH PROTEIN)  1,000 mL Per Tube Q24H  . free water  30 mL Per Tube Q4H  . heparin injection (subcutaneous)  5,000 Units Subcutaneous Q8H  . hydrocortisone sod succinate (SOLU-CORTEF) inj  50 mg Intravenous Q6H  . insulin aspart  0-9 Units Subcutaneous Q4H  . insulin aspart  3 Units Subcutaneous Q4H  . insulin glargine  5 Units Subcutaneous QHS  . ipratropium-albuterol  3 mL Nebulization Q4H  . mouth rinse  15 mL Mouth Rinse 10 times per day  . midodrine  10 mg Per Tube TID WC  . multivitamin  15 mL Per Tube Daily  . polyethylene glycol  17 g Oral Daily  . senna-docusate  1 tablet Per Tube BID  . sodium chloride flush  10-40 mL Intracatheter Q12H   albuterol, fentaNYL, fentaNYL (SUBLIMAZE) injection, heparin, lidocaine-prilocaine, LORazepam, midazolam, nystatin, polyethylene glycol, polyvinyl alcohol, sodium chloride flush, vecuronium  Assessment/ Plan:  Ms. [name] is a 63 y.o. black female with end stage renal disease on hemodialysis, hypertension, CVA, obstructive sleep apnea, peripheral vascular disease, hypertension, gout, GERD, diabetes mellitus type II, COPD, coronary artery disease, congestive heart failure, who  was admitted to [facility] on 04/27/2019 for pneumonia. Acute respiratory failure  requiring mechanical ventilation and vasopressors.   TTS [facility] left AVG   1. End stage renal disease  With generalized edema/volume overload Complication of dialysis device, clotted AVG.  Started on CRRT June 6 -Patient has tolerated CRRT well.  We will discontinue CRRT at this time and assess the patient for hemodialysis again tomorrow.  2. Anemia of chronic kidney disease:  Lab Results  Component Value Date   HGB 6.9 (L) 05/13/2019   -Hemoglobin down to 6.9.  Consider blood transfusion but defer to hospitalist or pulmonary/critical care.  3. Hypotension:  -Maintain map of 65 or greater.  Still on low-dose pressors.  4. Acute respiratory failure with cardiopulmonary arrest. Extubated 6/2, re-intubated 6/5 Maintain ventilatory support as per pulmonary/critical care.    LOS: 15 Munsoor Lateef 6/10/20201:27 PM

## 2019-05-13 NOTE — Consult Note (Signed)
PHARMACY CONSULT NOTE - FOLLOW UP  Pharmacy Consult for Electrolyte Monitoring and Replacement   Recent Labs: Potassium (mmol/L)  Date Value  05/13/2019 4.6  03/31/2015 4.2   Magnesium (mg/dL)  Date Value  05/13/2019 2.0  06/14/2014 1.8   Calcium (mg/dL)  Date Value  05/13/2019 7.6 (L)   Calcium, Total (mg/dL)  Date Value  03/31/2015 7.0 (LL)   Albumin (g/dL)  Date Value  05/13/2019 2.1 (L)  03/31/2015 2.3 (L)   Phosphorus (mg/dL)  Date Value  05/13/2019 3.4  03/31/2015 3.6   Sodium (mmol/L)  Date Value  05/13/2019 137  03/31/2015 129 (L)   Corrected Ca: 8.52 mg/dL   Assessment: 63 year old female patient with history of CVA, obstructive sleep apnea on CPAP, ESRD on hemodialysis Tuesday, Thursday, Saturday, congestive heart failure, COPD comes in because of worsening shortness of breath, she was called in the emergency room, had PEA, intubated, admitted to intensive care unit. Temporary HD cath placed 6/5, CRRT started 6/6. Has tolerated CRRT well. Ultrafiltration achieved yesterday was 1.4 kg  Goal of Therapy:  Replace electrolytes conservatively given CRRT  K ~ 3.5 mmol/L Magnesium ~ 1.7 mg/dL  Plan:  --no electrolyte replacement warranted at this time --CRRT is being d/c with remaining fluid left to dwell --labs are scheduled every 4 hours: final lab [place] ,PharmD Clinical Pharmacist 05/13/2019 7:02 AM

## 2019-05-13 NOTE — Progress Notes (Signed)
Assisted tele visit to patient with daughter.  [place], Abby Parker, RN  

## 2019-05-13 NOTE — Progress Notes (Addendum)
Inpatient Diabetes Program Recommendations  AACE/ADA: New Consensus Statement on Inpatient Glycemic Control  Target Ranges:  Prepandial:   less than 140 mg/dL      Peak postprandial:   less than 180 mg/dL (1-2 hours)      Critically ill patients:  140 - 180 mg/dL  Results for Hiltunen, Nasiya A (MRN [id number]) as of 05/13/2019 09:36  Ref. Range 05/13/2019 03:49 05/13/2019 07:21 05/13/2019 07:43  Glucose-Capillary Latest Ref Range: 70 - 99 mg/dL 194 (H) 220 (H) 221 (H)   Results for Berk, Jonaya A (MRN [id number]) as of 05/13/2019 09:36  Ref. Range 05/11/2019 07:44 05/11/2019 11:30 05/14/2019 15:29 05/30/2019 19:25 05/14/2019 23:32  Glucose-Capillary Latest Ref Range: 70 - 99 mg/dL 163 (H) 157 (H) 169 (H) 190 (H) 203 (H)   Review of Glycemic Control  Current orders for Inpatient glycemic control: Lantus 5 units QHS, Novolog 3 units Q4H, Novolog 0-9 units Q4H; Solucortef 50 mg Q6H  Inpatient Diabetes Program Recommendations:   Insulin - Basal: If steroids are continued, please consider increasing Lantus to 5 units BID.  Insulin - Tube Feeding Coverage: Please consider increasing tube feeding coverage to Novolog 4 units Q4H.  Thanks, Barnie Alderman, RN, MSN, CDE Diabetes Coordinator Inpatient Diabetes Program [phone] (Team Pager from 8am to 5pm)

## 2019-05-14 LAB — CBC WITH DIFFERENTIAL/PLATELET
Abs Immature Granulocytes: 0.59 10*3/uL — ABNORMAL HIGH (ref 0.00–0.07)
Basophils Absolute: 0 10*3/uL (ref 0.0–0.1)
Basophils Relative: 0 %
Eosinophils Absolute: 0 10*3/uL (ref 0.0–0.5)
Eosinophils Relative: 0 %
HCT: 24.8 % — ABNORMAL LOW (ref 36.0–46.0)
Hemoglobin: 7.9 g/dL — ABNORMAL LOW (ref 12.0–15.0)
Immature Granulocytes: 5 %
Lymphocytes Relative: 7 %
Lymphs Abs: 0.8 10*3/uL (ref 0.7–4.0)
MCH: 29.9 pg (ref 26.0–34.0)
MCHC: 31.9 g/dL (ref 30.0–36.0)
MCV: 93.9 fL (ref 80.0–100.0)
Monocytes Absolute: 0.8 10*3/uL (ref 0.1–1.0)
Monocytes Relative: 7 %
Neutro Abs: 9.5 10*3/uL — ABNORMAL HIGH (ref 1.7–7.7)
Neutrophils Relative %: 81 %
Platelets: 132 10*3/uL — ABNORMAL LOW (ref 150–400)
RBC: 2.64 MIL/uL — ABNORMAL LOW (ref 3.87–5.11)
RDW: 19.5 % — ABNORMAL HIGH (ref 11.5–15.5)
WBC: 11.7 10*3/uL — ABNORMAL HIGH (ref 4.0–10.5)
nRBC: 6.2 % — ABNORMAL HIGH (ref 0.0–0.2)

## 2019-05-14 LAB — RENAL FUNCTION PANEL
Albumin: 2.3 g/dL — ABNORMAL LOW (ref 3.5–5.0)
Anion gap: 12 (ref 5–15)
BUN: 65 mg/dL — ABNORMAL HIGH (ref 8–23)
CO2: 21 mmol/L — ABNORMAL LOW (ref 22–32)
Calcium: 7.7 mg/dL — ABNORMAL LOW (ref 8.9–10.3)
Chloride: 103 mmol/L (ref 98–111)
Creatinine, Ser: 4.23 mg/dL — ABNORMAL HIGH (ref 0.44–1.00)
GFR calc Af Amer: 12 mL/min — ABNORMAL LOW (ref >60)
GFR calc non Af Amer: 10 mL/min — ABNORMAL LOW (ref >60)
Glucose, Bld: 297 mg/dL — ABNORMAL HIGH (ref 70–99)
Phosphorus: 3.4 mg/dL (ref 2.5–4.6)
Potassium: 4.6 mmol/L (ref 3.5–5.1)
Sodium: 136 mmol/L (ref 135–145)

## 2019-05-14 LAB — BLOOD GAS, ARTERIAL
Acid-base deficit: 1.7 mmol/L (ref 0.0–2.0)
Bicarbonate: 24.3 mmol/L (ref 20.0–28.0)
FIO2: 1
MECHVT: 480 mL
O2 Saturation: 96.5 %
PEEP: 15 cmH2O
Patient temperature: 37
RATE: 26 resp/min
pCO2 arterial: 45 mmHg (ref 32.0–48.0)
pH, Arterial: 7.34 — ABNORMAL LOW (ref 7.350–7.450)
pO2, Arterial: 91 mmHg (ref 83.0–108.0)

## 2019-05-14 LAB — GLUCOSE, CAPILLARY
Glucose-Capillary: 116 mg/dL — ABNORMAL HIGH (ref 70–99)
Glucose-Capillary: 223 mg/dL — ABNORMAL HIGH (ref 70–99)
Glucose-Capillary: 232 mg/dL — ABNORMAL HIGH (ref 70–99)
Glucose-Capillary: 234 mg/dL — ABNORMAL HIGH (ref 70–99)
Glucose-Capillary: 260 mg/dL — ABNORMAL HIGH (ref 70–99)
Glucose-Capillary: 264 mg/dL — ABNORMAL HIGH (ref 70–99)
Glucose-Capillary: 278 mg/dL — ABNORMAL HIGH (ref 70–99)

## 2019-05-14 LAB — APTT: aPTT: 41 seconds — ABNORMAL HIGH (ref 24–36)

## 2019-05-14 LAB — MAGNESIUM: Magnesium: 1.9 mg/dL (ref 1.7–2.4)

## 2019-05-14 MED ORDER — MAGNESIUM SULFATE IN D5W 1-5 GM/100ML-% IV SOLN
1.0000 g | Freq: Once | INTRAVENOUS | Status: AC
  Administered 2019-05-14: 1 g via INTRAVENOUS
  Filled 2019-05-14: qty 100

## 2019-05-14 MED ORDER — HYDROCORTISONE NA SUCCINATE PF 100 MG IJ SOLR
50.0000 mg | Freq: Two times a day (BID) | INTRAMUSCULAR | Status: DC
  Administered 2019-05-14 – 2019-05-18 (×8): 50 mg via INTRAVENOUS
  Filled 2019-05-14 (×8): qty 2

## 2019-05-14 MED ORDER — CLOTRIMAZOLE 1 % VA CREA
1.0000 | TOPICAL_CREAM | Freq: Every day | VAGINAL | Status: DC
  Administered 2019-05-15 – 2019-05-20 (×6): 1 via VAGINAL
  Filled 2019-05-14 (×2): qty 45

## 2019-05-14 MED ORDER — FAMOTIDINE 20 MG PO TABS
20.0000 mg | ORAL_TABLET | Freq: Every day | ORAL | Status: DC
  Administered 2019-05-14 – 2019-05-20 (×3): 20 mg
  Filled 2019-05-14 (×3): qty 1

## 2019-05-14 MED ORDER — INSULIN ASPART 100 UNIT/ML ~~LOC~~ SOLN
0.0000 [IU] | SUBCUTANEOUS | Status: DC
  Administered 2019-05-14: 20:00:00 5 [IU] via SUBCUTANEOUS
  Administered 2019-05-14: 3 [IU] via SUBCUTANEOUS
  Administered 2019-05-15 (×3): 2 [IU] via SUBCUTANEOUS
  Administered 2019-05-15: 09:00:00 5 [IU] via SUBCUTANEOUS
  Administered 2019-05-16 – 2019-05-19 (×5): 2 [IU] via SUBCUTANEOUS
  Administered 2019-05-19: 3 [IU] via SUBCUTANEOUS
  Administered 2019-05-20: 05:00:00 2 [IU] via SUBCUTANEOUS
  Administered 2019-05-20: 3 [IU] via SUBCUTANEOUS
  Administered 2019-05-20: 2 [IU] via SUBCUTANEOUS
  Filled 2019-05-14 (×14): qty 1

## 2019-05-14 MED ORDER — SODIUM CHLORIDE 0.9 % IV SOLN
1.0000 g | INTRAVENOUS | Status: DC
  Administered 2019-05-14: 1 g via INTRAVENOUS
  Filled 2019-05-14 (×2): qty 1

## 2019-05-14 MED ORDER — POLYETHYLENE GLYCOL 3350 17 G PO PACK
17.0000 g | PACK | Freq: Two times a day (BID) | ORAL | Status: DC
  Administered 2019-05-14: 17 g via ORAL
  Filled 2019-05-14 (×2): qty 1

## 2019-05-14 MED ORDER — ALBUMIN HUMAN 25 % IV SOLN
12.5000 g | Freq: Once | INTRAVENOUS | Status: AC
  Administered 2019-05-14: 22:00:00 12.5 g via INTRAVENOUS
  Filled 2019-05-14: qty 50

## 2019-05-14 NOTE — Progress Notes (Signed)
PHARMACIST - PHYSICIAN COMMUNICATION  CONCERNING: IV to Oral Route Change Policy  RECOMMENDATION: This patient is receiving famotidine by the intravenous route.  Based on criteria approved by the Pharmacy and Therapeutics Committee, the intravenous medication(s) is/are being converted to the equivalent oral dose form(s).   DESCRIPTION: These criteria include:  The patient is eating (either orally or via tube) and/or has been taking other orally administered medications for a least 24 hours  The patient has no evidence of active gastrointestinal bleeding or impaired GI absorption (gastrectomy, short bowel, patient on TNA or NPO).  If you have questions about this conversion, please contact the Pharmacy Department   []   [phone] )  Carlos, [facility] 05/14/2019 8:35 AM

## 2019-05-14 NOTE — Consult Note (Signed)
Pharmacy Antibiotic Note  [name] is a 63 y.o. female admitted on 04/19/2019 with pneumonia.  Pharmacy has been consulted for Zosyn dosing. Patient is receiving CRRT. Antibiotics were stopped 6/2 and aspiration PNA was suspected following re-intubation on 6/5, when Zosyn was started. This is day #6 of Zosyn. Leukocytosis improved initially but is now trending up. Right femoral dialysis catheter replaced 6/8. CRRT discontinued on 6/10 and patient is to have dialysis on 6/11.   Plan: Will transition patient to ceftazadime 1g IV Q24hr x 3 dose.   Height: 5' 5.98" (167.6 cm) Weight: (!) 324 lb (147 kg) IBW/kg (Calculated) : 59.26  Temp (24hrs), Avg:98.2 F (36.8 C), Min:97.6 F (36.4 C), Max:98.9 F (37.2 C)  Recent Labs  Lab 05/10/19 0520  05/07/2019 0414  06/02/2019 0518  05/13/19 0558 05/13/19 0604 05/13/19 0902 05/13/19 1348 05/13/19 1716 05/14/19 0429  WBC 32.0*  --  14.7*  --  15.7*  --  11.2*  --   --   --   --  11.7*  CREATININE 5.66*   < > 4.35*   < > 4.51*   < >  --  4.20* 3.98* 3.73* 3.58* 4.23*   < > = values in this interval not displayed.    Estimated Creatinine Clearance: 20 mL/min (A) (by C-G formula based on SCr of 4.23 mg/dL (H)).    Antimicrobials this admission:  Zosyn 6/ 6>> 6/11 Vancomycin 5/26 >> 5/27 Cefepime 5/26 >> 5/29 Azithromycin 5/29 >> 6/2 Doxycyline 5/29 >> 6/2 Ceftazidime  6/11 >> 6/13  Microbiology results:  5/26 MRSA PCR: neg 5/29 RCx few C albicans, C tropicalis 6/6 Sputum Culture: pseudomonas aeruginosa    Thank you for allowing pharmacy to be a part of this patient's care.  Simpson,Michael L, PharmD 05/14/2019 4:15 PM

## 2019-05-14 NOTE — Progress Notes (Signed)
Pt restless/ diastolic 147'W / heart rate 109

## 2019-05-14 NOTE — Progress Notes (Signed)
[name] at [place] NAME: [name]    MR#:  [id number]  DATE OF BIRTH:  1956-09-06     Patient remains on ventilator and CRRT.  Seems a little bit more alert today.  REVIEW OF SYSTEMS:   Unable to obtain due to patient being intubated and sedated  DRUG ALLERGIES:   Allergies  Allergen Reactions  . Iodine Rash  . Enalapril Other (See Comments)    TONGUE SWELLS/GOUT  . Iodinated Diagnostic Agents Other (See Comments)    BREAK OUT IN WHELPS      VITALS:  Blood pressure (!) 152/87, pulse (!) 103, temperature 98.9 F (37.2 C), temperature source Oral, resp. rate 19, height 5' 5.98" (1.676 m), weight (!) 147 kg, SpO2 95 %.  PHYSICAL EXAMINATION:  GENERAL:  63 y.o.-morbidly obese and critically ill-appearing. EYES: Pupils equal, round, reactive to light  No scleral icterus.  HEENT: Head atraumatic, normocephalic.  Currently intubated, sedated. ETT in place. NECK:  Supple, no jugular venous distention. No thyroid enlargement, no tenderness.  LUNGS: +diffuse rhonchi and wheezing present CARDIOVASCULAR: RRR, S1, S2 normal. No murmurs, rubs, or gallops.  ABDOMEN: Soft, nontender, nondistended. Bowel sounds present. No organomegaly or mass.  EXTREMITIES: +pedal edema, no cyanosis or clubbing.  NEUROLOGIC: Unable to do neuro exam because of intubation, sedation. PSYCHIATRIC: Intubated, sedated.   SKIN: No obvious rash, lesion, or ulcer.   LABORATORY PANEL:   CBC Recent Labs  Lab 05/14/19 0429  WBC 11.7*  HGB 7.9*  HCT 24.8*  PLT 132*   ------------------------------------------------------------------------------------------------------------------  Chemistries  Recent Labs  Lab 05/14/19 0429  NA 136  K 4.6  CL 103  CO2 21*  GLUCOSE 297*  BUN 65*  CREATININE 4.23*  CALCIUM 7.7*  MG 1.9    ------------------------------------------------------------------------------------------------------------------  Cardiac Enzymes No results for input(s): TROPONINI in the last 168 hours. ------------------------------------------------------------------------------------------------------------------  RADIOLOGY:  Dg Chest Port 1 View  Result Date: 05/13/2019 CLINICAL DATA:  Respiratory failure EXAM: PORTABLE CHEST 1 VIEW COMPARISON:  05/10/2019 FINDINGS: Endotracheal tube and nasogastric catheter are noted in satisfactory position. Hero graft is again seen and unchanged. Cardiac shadow is stable. The overall inspiratory effort is poor with elevation of the right hemidiaphragm. Small effusions are noted bilaterally. Mild bibasilar atelectasis is again seen. No new focal abnormality is noted. IMPRESSION: Stable appearance of the chest with small effusions and bibasilar atelectasis. Electronically Signed   By: Inez Catalina M.D.   On: 05/13/2019 07:22    EKG:   Orders placed or performed during the hospital encounter of 04/09/2019  . EKG 12-Lead  . EKG 12-Lead  . EKG 12-Lead  . EKG 12-Lead    ASSESSMENT AND PLAN:   63 year old female patient with history of CVA, obstructive sleep apnea on CPAP, ESRD on hemodialysis Tuesday, Thursday, Saturday, congestive heart failure, COPD comes in because of worsening shortness of breath, she was called in the emergency room, had PEA, intubated, admitted to intensive care unit.  Acute hypoxic and hypercapneic respiratory failure- status post PEA arrest. Also with aspiration pneumonia -Extubated 6/2 and then had to be reintubated 6/5 -Vent management per CCM -Plan was for trach, but patient unable to obtain trach due to severe hypoxia -Continue Zosyn -Sputum cultures growing pseudomonas  Shock- multi-factorial due to sepsis/hypovolemia/cardiogenic -On multiple pressors -Continue midodrine tid -Abx as above  ESRD- on hemodialysis TTS. Temporary  HD cath placed 6/5. -Nephrology following -CRRT started 6/6, stopped 6/10  Type 2 diabetes- blood sugars  well controlled -Lantus and SSI  Hyperlipidemia- stable -Continue Lipitor  History of stroke -Continue Plavix  COPD- stable, no signs of acute exacerbation. -Continue nebs  Anemia in chronic kidney disease- hemoglobin low but stable. -EPO per nephrology   All the records are reviewed and case discussed with Care Management/Social Workerr. Management plans discussed with the patient, family and they are in agreement.  CODE STATUS: full  TOTAL TIME TAKING CARE OF THIS PATIENT: 28 minutes.   Berna Spare [facility] M.D on 05/14/2019 at 1:18 PM  Between 7am to 6pm - Pager - [phone]  After 6pm go to [email or web address] - password [email or web address] [name]  Office  [phone]  CC: Primary care physician; Smithson, [name], MD   Note: This dictation was prepared with Dragon dictation along with smaller phrase technology. Any transcriptional errors that result from this process are unintentional.

## 2019-05-14 NOTE — Progress Notes (Signed)
[facility]  ROUNDING NOTE   Subjective:  Patient seen at bedside. Remains critically ill. Now off CRRT. We plan for hemodialysis today.   Objective:  Vital signs in last 24 hours:  Temp:  [97.5 F (36.4 C)-98.9 F (37.2 C)] 98.9 F (37.2 C) (06/11 1200) Pulse Rate:  [61-103] 103 (06/11 1200) Resp:  [0-26] 19 (06/11 1200) BP: (98-163)/(49-87) 152/87 (06/11 1200) SpO2:  [90 %-98 %] 95 % (06/11 1200) FiO2 (%):  [60 %-100 %] 100 % (06/11 1358) Weight:  [147 kg] 147 kg (06/11 0500)  Weight change: 6.865 kg Filed Weights   05/16/2019 0500 05/13/19 0202 05/14/19 0500  Weight: (!) 137.6 kg (!) 140.1 kg (!) 147 kg    Intake/Output: I/O last 3 completed shifts: In: 4322.9 [I.V.:1702.6; Blood:280; NG/GT:1940.1; IV Piggyback:400.2] Out: 1808 [Emesis/NG output:100; Other:1708]   Intake/Output this shift:  Total I/O In: 705.6 [I.V.:505.8; IV Piggyback:199.7] Out: -   Physical Exam: General: Critically ill appearing  HEENT ET tube, OG tube  Neck: supple  Lungs:  Bilateral rhonchi, vent assisted  Heart: Regular rate and rhythm  Abdomen:  Soft, nontender, obese  Extremities: + peripheral edema.  Neurologic: Sedated  Skin: warm  Access: thrombosed left arm AVG, rt fem temp cath replaced 6/8    Basic Metabolic Panel: Recent Labs  Lab 05/10/19 1508  05/15/2019 0414  05/16/2019 0518  05/13/19 0558 05/13/19 0604 05/13/19 0902 05/13/19 1348 05/13/19 1716 05/14/19 0429  NA 135   < > 137   < > 137   < >  --  137 137 137 135 136  K 4.7   < > 3.8   < > 3.7   < >  --  4.6 4.2 4.3 4.4 4.6  CL 101   < > 101   < > 103   < >  --  104 104 104 102 103  CO2 20*   < > 25   < > 22   < >  --  23 23 25 23  21*  GLUCOSE 252*   < > 179*   < > 168*   < >  --  263* 238* 210* 251* 297*  BUN 39*   < > 34*   < > 42*   < >  --  48* 49* 49* 50* 65*  CREATININE 5.97*   < > 4.35*   < > 4.51*   < >  --  4.20* 3.98* 3.73* 3.58* 4.23*  CALCIUM 7.8*   < > 8.1*   < > 7.9*   < >  --  7.6* 7.8*  7.9* 7.7* 7.7*  MG 2.0  --  1.9  --  2.1  --  2.0  --   --   --   --  1.9  PHOS  --    < > 3.7   < > 3.7   < >  --  3.4 3.0 2.7 3.2 3.4   < > = values in this interval not displayed.    Liver Function Tests: Recent Labs  Lab 05/13/19 0604 05/13/19 0902 05/13/19 1348 05/13/19 1716 05/14/19 0429  ALBUMIN 2.1* 2.3* 2.4* 2.3* 2.3*   No results for input(s): LIPASE, AMYLASE in the last 168 hours. No results for input(s): AMMONIA in the last 168 hours.  CBC: Recent Labs  Lab 05/09/19 0507 05/10/19 0520 05/14/2019 0414 05/09/2019 0518 05/13/19 0558 05/13/19 1943 05/14/19 0429  WBC 16.6* 32.0* 14.7* 15.7* 11.2*  --  11.7*  NEUTROABS 12.8*  --   --  13.7*  --   --  9.5*  HGB 8.6* 9.0* 7.9* 7.6* 6.9* 8.4* 7.9*  HCT 28.6* 30.5* 25.3* 24.1* 22.5* 26.8* 24.8*  MCV 97.9 99.3 95.8 93.1 97.8  --  93.9  PLT 304 255 175 163 145*  --  132*    Cardiac Enzymes: No results for input(s): CKTOTAL, CKMB, CKMBINDEX, TROPONINI in the last 168 hours.  BNP: Invalid input(s): POCBNP  CBG: Recent Labs  Lab 05/13/19 1928 05/14/19 0005 05/14/19 0336 05/14/19 0729 05/14/19 1147  GLUCAP 214* 232* 264* 260* 278*    Microbiology: Results for orders placed or performed during the hospital encounter of 04/22/2019  SARS Coronavirus 2 (CEPHEID - Performed in [facility] hospital lab), Hosp Order     Status: None   Collection Time: 04/03/2019  7:31 AM   Specimen: Nasopharyngeal Swab  Result Value Ref Range Status   SARS Coronavirus 2 NEGATIVE NEGATIVE Final    Comment: (NOTE) If result is NEGATIVE SARS-CoV-2 target nucleic acids are NOT DETECTED. The SARS-CoV-2 RNA is generally detectable in upper and lower  respiratory specimens during the acute phase of infection. The lowest  concentration of SARS-CoV-2 viral copies this assay can detect is 250  copies / mL. A negative result does not preclude SARS-CoV-2 infection  and should not be used as the sole basis for treatment or other  patient  management decisions.  A negative result may occur with  improper specimen collection / handling, submission of specimen other  than nasopharyngeal swab, presence of viral mutation(s) within the  areas targeted by this assay, and inadequate number of viral copies  (<250 copies / mL). A negative result must be combined with clinical  observations, patient history, and epidemiological information. If result is POSITIVE SARS-CoV-2 target nucleic acids are DETECTED. The SARS-CoV-2 RNA is generally detectable in upper and lower  respiratory specimens dur ing the acute phase of infection.  Positive  results are indicative of active infection with SARS-CoV-2.  Clinical  correlation with patient history and other diagnostic information is  necessary to determine patient infection status.  Positive results do  not rule out bacterial infection or co-infection with other viruses. If result is PRESUMPTIVE POSTIVE SARS-CoV-2 nucleic acids MAY BE PRESENT.   A presumptive positive result was obtained on the submitted specimen  and confirmed on repeat testing.  While 2019 novel coronavirus  (SARS-CoV-2) nucleic acids may be present in the submitted sample  additional confirmatory testing may be necessary for epidemiological  and / or clinical management purposes  to differentiate between  SARS-CoV-2 and other Sarbecovirus currently known to infect humans.  If clinically indicated additional testing with an alternate test  methodology [phone]) is advised. The SARS-CoV-2 RNA is generally  detectable in upper and lower respiratory sp ecimens during the acute  phase of infection. The expected result is Negative. Fact Sheet for Patients:  StrictlyIdeas.no Fact Sheet for Healthcare Providers: BankingDealers.co.za This test is not yet approved or cleared by the [place] FDA and has been authorized for detection and/or diagnosis of SARS-CoV-2 by FDA under  an Emergency Use Authorization (EUA).  This EUA will remain in effect (meaning this test can be used) for the duration of the COVID-19 declaration under Section 564(b)(1) of the Act, 21 U.S.C. section 360bbb-3(b)(1), unless the authorization is terminated or revoked sooner. Performed at [facility], [address] [place], [place] [address]   MRSA PCR Screening     Status: None   Collection Time: 04/15/2019  9:04 AM  Specimen: Nasal Mucosa; Nasopharyngeal  Result Value Ref Range Status   MRSA by PCR NEGATIVE NEGATIVE Final    Comment:        The GeneXpert MRSA Assay (FDA approved for NASAL specimens only), is one component of a comprehensive MRSA colonization surveillance program. It is not intended to diagnose MRSA infection nor to guide or monitor treatment for MRSA infections. Performed at [facility], [address] [place], [place] [address]   Culture, respiratory (non-expectorated)     Status: None   Collection Time: 05/01/19 11:05 AM   Specimen: Tracheal Aspirate; Respiratory  Result Value Ref Range Status   Specimen Description   Final    TRACHEAL ASPIRATE Performed at [facility], [address] [place], [place] [address]    Special Requests   Final    NONE Performed at [facility], [address] [place], [place] [address]    Gram Stain   Final    MODERATE WBC PRESENT, PREDOMINANTLY PMN RARE YEAST Performed at [facility], [place] [address] [place], [place] [address]    Culture FEW CANDIDA ALBICANS FEW CANDIDA TROPICALIS   Final   Report Status 05/04/2019 FINAL  Final  Culture, respiratory     Status: None   Collection Time: 05/09/19  6:30 PM   Specimen: INDUCED SPUTUM  Result Value Ref Range Status   Specimen Description   Final    INDUCED SPUTUM Performed at [facility], [address] [place], [place] [address]    Special Requests   Final    NONE Performed at [facility],  [address] [place], [place] [address]    Gram Stain   Final    MODERATE WBC PRESENT, PREDOMINANTLY PMN FEW GRAM NEGATIVE RODS Performed at [facility], [place] [address] [place], [place] [address]    Culture MODERATE PSEUDOMONAS AERUGINOSA  Final   Report Status 05/26/2019 FINAL  Final   Organism ID, Bacteria PSEUDOMONAS AERUGINOSA  Final      Susceptibility   Pseudomonas aeruginosa - MIC*    CEFTAZIDIME 2 SENSITIVE Sensitive     CIPROFLOXACIN <=0.25 SENSITIVE Sensitive     GENTAMICIN <=1 SENSITIVE Sensitive     IMIPENEM 2 SENSITIVE Sensitive     PIP/TAZO <=4 SENSITIVE Sensitive     CEFEPIME <=1 SENSITIVE Sensitive     * MODERATE PSEUDOMONAS AERUGINOSA    Coagulation Studies: No results for input(s): LABPROT, INR in the last 72 hours.  Urinalysis: No results for input(s): COLORURINE, LABSPEC, PHURINE, GLUCOSEU, HGBUR, BILIRUBINUR, KETONESUR, PROTEINUR, UROBILINOGEN, NITRITE, LEUKOCYTESUR in the last 72 hours.  Invalid input(s): APPERANCEUR    Imaging: Dg Chest Port 1 View  Result Date: 05/13/2019 CLINICAL DATA:  Respiratory failure EXAM: PORTABLE CHEST 1 VIEW COMPARISON:  05/10/2019 FINDINGS: Endotracheal tube and nasogastric catheter are noted in satisfactory position. Hero graft is again seen and unchanged. Cardiac shadow is stable. The overall inspiratory effort is poor with elevation of the right hemidiaphragm. Small effusions are noted bilaterally. Mild bibasilar atelectasis is again seen. No new focal abnormality is noted. IMPRESSION: Stable appearance of the chest with small effusions and bibasilar atelectasis. Electronically Signed   By: Inez Catalina M.D.   On: 05/13/2019 07:22     Medications:   . sodium chloride 200 mL (05/10/19 1200)  . fentaNYL infusion INTRAVENOUS 300 mcg/hr (05/14/19 1245)  . heparin Stopped (05/25/2019 0530)  . norepinephrine (LEVOPHED) Adult infusion Stopped (05/13/19 1327)  . vasopressin (PITRESSIN) infusion - *  FOR SHOCK* Stopped  (05/10/19 1634)   . allopurinol  150 mg Per Tube Once per day on Tue Thu Sat  . amitriptyline  25 mg Per Tube QHS  . atorvastatin  40 mg Per Tube Daily  . budesonide (PULMICORT) nebulizer solution  0.5 mg Nebulization BID  . Chlorhexidine Gluconate Cloth  6 each Topical Daily  . citalopram  20 mg Per Tube Daily  . clopidogrel  75 mg Per Tube Daily  . epoetin (EPOGEN/PROCRIT) injection  10,000 Units Intravenous Q T,Th,Sa-HD  . famotidine  20 mg Per Tube QHS  . feeding supplement (PRO-STAT SUGAR FREE 64)  30 mL Per Tube 5 X Daily  . feeding supplement (VITAL HIGH PROTEIN)  1,000 mL Per Tube Q24H  . free water  30 mL Per Tube Q4H  . heparin injection (subcutaneous)  5,000 Units Subcutaneous Q8H  . hydrocortisone sod succinate (SOLU-CORTEF) inj  50 mg Intravenous Q12H  . insulin aspart  0-9 Units Subcutaneous Q4H  . insulin aspart  4 Units Subcutaneous Q4H  . insulin glargine  5 Units Subcutaneous BID  . ipratropium-albuterol  3 mL Nebulization Q4H  . mouth rinse  15 mL Mouth Rinse 10 times per day  . midodrine  10 mg Per Tube TID WC  . multivitamin  15 mL Per Tube Daily  . polyethylene glycol  17 g Oral Daily  . senna-docusate  1 tablet Per Tube BID  . sodium chloride flush  10-40 mL Intracatheter Q12H   albuterol, fentaNYL, fentaNYL (SUBLIMAZE) injection, heparin, lidocaine-prilocaine, LORazepam, midazolam, nystatin, polyethylene glycol, polyvinyl alcohol, sodium chloride flush, vecuronium  Assessment/ Plan:  Ms. LARINE FIELDING is a 63 y.o. black female with end stage renal disease on hemodialysis, hypertension, CVA, obstructive sleep apnea, peripheral vascular disease, hypertension, gout, GERD, diabetes mellitus type II, COPD, coronary artery disease, congestive heart failure, who  was admitted to [facility] on 04/16/2019 for pneumonia. Acute respiratory failure requiring mechanical ventilation and vasopressors.   TTS [facility]phrology [facility] left AVG   1. End stage renal disease   With generalized edema/volume overload Complication of dialysis device, clotted AVG.  Started on CRRT June 6 -Patient due for hemodialysis today.  Orders have been prepared.  2. Anemia of chronic kidney disease:  Lab Results  Component Value Date   HGB 7.9 (L) 05/14/2019   -Hemoglobin up to 7.9 posttransfusion.  Continue to monitor CBC.  3. Hypotension:  -Patient has been weaned off of norepinephrine.  Continue to monitor blood pressure with a target map of 65 or greater.  4. Acute respiratory failure with cardiopulmonary arrest. Extubated 6/2, re-intubated 6/5 Patient remains on the ventilator at this time.  Continue current ventilatory support.    LOS: 16 [name] 6/11/20202:46 PM

## 2019-05-14 NOTE — Progress Notes (Signed)
This note also relates to the following rows which could not be included: Pulse Rate - Cannot attach notes to unvalidated device data Resp - Cannot attach notes to unvalidated device data SpO2 - Cannot attach notes to unvalidated device data End Tidal CO2 (EtCO2) - Cannot attach notes to unvalidated device data  Blood pressure dropped from 160's to 90. uf off.

## 2019-05-14 NOTE — Progress Notes (Signed)
CRITICAL CARE NOTE  CC  Severe  respiratory failure  SUBJECTIVE Patient remains critically ill Prognosis is guarded Multiorgan failure +aspirtaion pneumonia Severe hypoxia   Vent Mode: PRVC FiO2 (%):  [60 %-100 %] 100 % Set Rate:  [26 bmp] 26 bmp Vt Set:  [480 mL] 480 mL PEEP:  [12 cmH20-15 cmH20] 15 cmH20 Plateau Pressure:  [25 cmH20-29 cmH20] 27 cmH20    BP (!) 98/49 (BP Location: Right Arm)   Pulse 81   Temp 98.6 F (37 C) (Oral)   Resp (!) 26   Ht 5' 5.98" (1.676 m)   Wt (!) 147 kg   SpO2 96%   BMI 52.32 kg/m    I/O last 3 completed shifts: In: 4322.9 [I.V.:1702.6; Blood:280; NG/GT:1940.1; IV Piggyback:400.2] Out: 1808 [Emesis/NG output:100; Other:1708] No intake/output data recorded.  SpO2: 96 % O2 Flow Rate (L/min): 15 L/min FiO2 (%): 100 %   SIGNIFICANT EVENTS 5/26 ICU admission for resp failure and cardiac arrest 5/26 hypothermia protocol 6/3 successfully extubated 6/5 re-admitted to the ICU for aspiration pneumonia and progressive diastolic heart failure and renal failure 6/6 plan for [facility], ENT consulted, remains critically ill on vent 6/6 near cardiac arrest, fio2 100%, PEEP 15 6/7 severe resp failure, multiorgan failure, husband and daughter updated 6/8 unable to obtain trach due to severe hypoxia 6/10 severe hypoxia fio2 100%   REVIEW OF SYSTEMS  PATIENT IS UNABLE TO PROVIDE COMPLETE REVIEW OF SYSTEMS DUE TO SEVERE CRITICAL ILLNESS   PHYSICAL EXAMINATION:  GENERAL:critically ill appearing, +resp distress HEAD: Normocephalic, atraumatic.  EYES: Pupils equal, round, reactive to light.  No scleral icterus.  MOUTH: Moist mucosal membrane. NECK: Supple. No thyromegaly. No nodules. No JVD.  PULMONARY: +rhonchi, +wheezing CARDIOVASCULAR: S1 and S2. Regular rate and rhythm. No murmurs, rubs, or gallops.  GASTROINTESTINAL: Soft, nontender, -distended. No masses. Positive bowel sounds. No hepatosplenomegaly.  MUSCULOSKELETAL: No swelling,  clubbing, or edema.  NEUROLOGIC: obtunded, GCS<8 SKIN:intact,warm,dry  MEDICATIONS: I have reviewed all medications and confirmed regimen as documented   CULTURE RESULTS   Recent Results (from the past 240 hour(s))  Culture, respiratory     Status: None   Collection Time: 05/09/19  6:30 PM   Specimen: INDUCED SPUTUM  Result Value Ref Range Status   Specimen Description   Final    INDUCED SPUTUM Performed at [facility], [address], [place], [place] [address]    Special Requests   Final    NONE Performed at [facility], [address], [place], [place] [address]    Gram Stain   Final    MODERATE WBC PRESENT, PREDOMINANTLY PMN FEW GRAM NEGATIVE RODS Performed at [facility], [place] [address], [place], [place] [address]    Culture MODERATE PSEUDOMONAS AERUGINOSA  Final   Report Status 05/16/2019 FINAL  Final   Organism ID, Bacteria PSEUDOMONAS AERUGINOSA  Final      Susceptibility   Pseudomonas aeruginosa - MIC*    CEFTAZIDIME 2 SENSITIVE Sensitive     CIPROFLOXACIN <=0.25 SENSITIVE Sensitive     GENTAMICIN <=1 SENSITIVE Sensitive     IMIPENEM 2 SENSITIVE Sensitive     PIP/TAZO <=4 SENSITIVE Sensitive     CEFEPIME <=1 SENSITIVE Sensitive     * MODERATE PSEUDOMONAS AERUGINOSA         Indwelling Urinary Catheter continued, requirement due to   Reason to continue Indwelling Urinary Catheter strict Intake/Output monitoring for hemodynamic instability   Central Line/ continued, requirement due to  Reason to continue  [employer] Monitoring of central venous pressure or other hemodynamic parameters and poor IV access   Ventilator continued, requirement due to severe respiratory failure   Ventilator Sedation RASS 0 to -2      ASSESSMENT AND PLAN SYNOPSIS  63 yo morbidly obese AAF with severe end stage renal disease s/p cardiac arrest and hypothermia protocol with recurrent resp failure from acute diastolic heart failure and aspiration  pneumonia  Due to her underlying end stage diastolic heart failure and end stage renal disease with recurrent resp failure and significant morbid obesity, she well need Trach for survival. Sister and Husband notified of clinical status. Patient unable to obtain Trach due to severe hypoxia    Severe ACUTE Hypoxic and Hypercapnic Respiratory Failure -continue Full MV support -continue Bronchodilator Therapy -Wean Fio2 and PEEP as tolerated Unable to wean from vent  [place]-  -oxygen as needed   CHRONIC  KIDNEY INJURY/Renal Failure -follow chem 7 -follow UO -continue Foley Catheter-assess need -Avoid nephrotoxic agents -Recheck creatinine  HD today  NEUROLOGY - intubated and sedated - minimal sedation to achieve a RASS goal: -1   CARDIAC ICU monitoring  ID -continue IV abx as prescibed -follow up cultures  GI GI PROPHYLAXIS as indicated  NUTRITIONAL STATUS DIET-->TF's as tolerated Constipation protocol as indicated  ENDO - will use ICU hypoglycemic\Hyperglycemia protocol if indicated   ELECTROLYTES -follow labs as needed -replace as needed -pharmacy consultation and following   DVT/GI PRX ordered TRANSFUSIONS AS NEEDED MONITOR FSBS ASSESS the need for LABS as needed   Critical Care Time devoted to patient care services described in this note is 32 minutes.   Overall, patient is critically ill, prognosis is guarded.  Patient with Multiorgan failure and at high risk for cardiac arrest and death.   Will plan to transfer to [facility] for [place] and [place], M.D.  Velora Heckler Pulmonary [facility]  Medical Director [place] Director [facility] Cardio-Pulmonary Department

## 2019-05-14 NOTE — Progress Notes (Signed)
Inpatient Diabetes Program Recommendations  AACE/ADA: New Consensus Statement on Inpatient Glycemic Control  Target Ranges:  Prepandial:   less than 140 mg/dL      Peak postprandial:   less than 180 mg/dL (1-2 hours)      Critically ill patients:  140 - 180 mg/dL   Results for Hoskinson, Alana A (MRN [id number]) as of 05/14/2019 09:07  Ref. Range 05/13/2019 07:21 05/13/2019 07:43 05/13/2019 11:07 05/13/2019 16:26 05/13/2019 19:28 05/14/2019 00:05 05/14/2019 03:36 05/14/2019 07:29  Glucose-Capillary Latest Ref Range: 70 - 99 mg/dL 220 (H) 221 (H) 185 (H) 207 (H) 214 (H) 232 (H) 264 (H) 260 (H)   Review of Glycemic Control  Current orders for Inpatient glycemic control: Lantus 5 units BID, Novolog 4 units Q4H, Novolog 0-9 units Q4H; Solucortef 50 mg Q6H  Inpatient Diabetes Program Recommendations:   Insulin - Basal: If steroids are continued, please consider increasing Lantus to 10 units BID. Please note that if Lantus is increased as recommended, it will need to be adjusted as steroids are tapered.  Insulin-Correction: Please consider increasing Novolog correction to moderate scale 0-15 units Q4H.  Thanks, Barnie Alderman, RN, MSN, CDE Diabetes Coordinator Inpatient Diabetes Program [phone] (Team Pager from 8am to 5pm)

## 2019-05-15 ENCOUNTER — Inpatient Hospital Stay: Payer: Medicare Other

## 2019-05-15 ENCOUNTER — Ambulatory Visit (INDEPENDENT_AMBULATORY_CARE_PROVIDER_SITE_OTHER): Payer: Medicare Other | Admitting: Nurse Practitioner

## 2019-05-15 ENCOUNTER — Encounter (INDEPENDENT_AMBULATORY_CARE_PROVIDER_SITE_OTHER): Payer: Medicare Other

## 2019-05-15 LAB — RENAL FUNCTION PANEL
Albumin: 2.3 g/dL — ABNORMAL LOW (ref 3.5–5.0)
Albumin: 2.5 g/dL — ABNORMAL LOW (ref 3.5–5.0)
Albumin: 2.6 g/dL — ABNORMAL LOW (ref 3.5–5.0)
Anion gap: 9 (ref 5–15)
Anion gap: 12 (ref 5–15)
Anion gap: 10 (ref 5–15)
BUN: 59 mg/dL — ABNORMAL HIGH (ref 8–23)
BUN: 53 mg/dL — ABNORMAL HIGH (ref 8–23)
BUN: 48 mg/dL — ABNORMAL HIGH (ref 8–23)
CO2: 26 mmol/L (ref 22–32)
CO2: 24 mmol/L (ref 22–32)
CO2: 26 mmol/L (ref 22–32)
Calcium: 7.5 mg/dL — ABNORMAL LOW (ref 8.9–10.3)
Calcium: 7.8 mg/dL — ABNORMAL LOW (ref 8.9–10.3)
Calcium: 7.9 mg/dL — ABNORMAL LOW (ref 8.9–10.3)
Chloride: 103 mmol/L (ref 98–111)
Chloride: 100 mmol/L (ref 98–111)
Chloride: 101 mmol/L (ref 98–111)
Creatinine, Ser: 3.75 mg/dL — ABNORMAL HIGH (ref 0.44–1.00)
Creatinine, Ser: 3.38 mg/dL — ABNORMAL HIGH (ref 0.44–1.00)
Creatinine, Ser: 3.09 mg/dL — ABNORMAL HIGH (ref 0.44–1.00)
GFR calc Af Amer: 14 mL/min — ABNORMAL LOW (ref >60)
GFR calc Af Amer: 16 mL/min — ABNORMAL LOW (ref >60)
GFR calc Af Amer: 18 mL/min — ABNORMAL LOW (ref >60)
GFR calc non Af Amer: 12 mL/min — ABNORMAL LOW (ref >60)
GFR calc non Af Amer: 14 mL/min — ABNORMAL LOW (ref >60)
GFR calc non Af Amer: 15 mL/min — ABNORMAL LOW (ref >60)
Glucose, Bld: 111 mg/dL — ABNORMAL HIGH (ref 70–99)
Glucose, Bld: 139 mg/dL — ABNORMAL HIGH (ref 70–99)
Glucose, Bld: 132 mg/dL — ABNORMAL HIGH (ref 70–99)
Phosphorus: 2.4 mg/dL — ABNORMAL LOW (ref 2.5–4.6)
Phosphorus: 2.4 mg/dL — ABNORMAL LOW (ref 2.5–4.6)
Phosphorus: 2.3 mg/dL — ABNORMAL LOW (ref 2.5–4.6)
Potassium: 4 mmol/L (ref 3.5–5.1)
Potassium: 4.3 mmol/L (ref 3.5–5.1)
Potassium: 4.2 mmol/L (ref 3.5–5.1)
Sodium: 138 mmol/L (ref 135–145)
Sodium: 136 mmol/L (ref 135–145)
Sodium: 137 mmol/L (ref 135–145)

## 2019-05-15 LAB — GLUCOSE, CAPILLARY
Glucose-Capillary: 109 mg/dL — ABNORMAL HIGH (ref 70–99)
Glucose-Capillary: 114 mg/dL — ABNORMAL HIGH (ref 70–99)
Glucose-Capillary: 133 mg/dL — ABNORMAL HIGH (ref 70–99)
Glucose-Capillary: 145 mg/dL — ABNORMAL HIGH (ref 70–99)
Glucose-Capillary: 165 mg/dL — ABNORMAL HIGH (ref 70–99)
Glucose-Capillary: 231 mg/dL — ABNORMAL HIGH (ref 70–99)

## 2019-05-15 LAB — CBC WITH DIFFERENTIAL/PLATELET
Abs Immature Granulocytes: 0.96 10*3/uL — ABNORMAL HIGH (ref 0.00–0.07)
Basophils Absolute: 0 10*3/uL (ref 0.0–0.1)
Basophils Relative: 0 %
Eosinophils Absolute: 0.1 10*3/uL (ref 0.0–0.5)
Eosinophils Relative: 1 %
HCT: 22.4 % — ABNORMAL LOW (ref 36.0–46.0)
Hemoglobin: 7.1 g/dL — ABNORMAL LOW (ref 12.0–15.0)
Immature Granulocytes: 6 %
Lymphocytes Relative: 6 %
Lymphs Abs: 1 10*3/uL (ref 0.7–4.0)
MCH: 30.1 pg (ref 26.0–34.0)
MCHC: 31.7 g/dL (ref 30.0–36.0)
MCV: 94.9 fL (ref 80.0–100.0)
Monocytes Absolute: 1.9 10*3/uL — ABNORMAL HIGH (ref 0.1–1.0)
Monocytes Relative: 12 %
Neutro Abs: 11.7 10*3/uL — ABNORMAL HIGH (ref 1.7–7.7)
Neutrophils Relative %: 75 %
Platelets: 145 10*3/uL — ABNORMAL LOW (ref 150–400)
RBC: 2.36 MIL/uL — ABNORMAL LOW (ref 3.87–5.11)
RDW: 19.9 % — ABNORMAL HIGH (ref 11.5–15.5)
WBC: 15.8 10*3/uL — ABNORMAL HIGH (ref 4.0–10.5)
nRBC: 4.9 % — ABNORMAL HIGH (ref 0.0–0.2)

## 2019-05-15 LAB — MAGNESIUM
Magnesium: 1.8 mg/dL (ref 1.7–2.4)
Magnesium: 1.8 mg/dL (ref 1.7–2.4)
Magnesium: 1.8 mg/dL (ref 1.7–2.4)
Magnesium: 2.1 mg/dL (ref 1.7–2.4)

## 2019-05-15 LAB — APTT: aPTT: 36 seconds (ref 24–36)

## 2019-05-15 MED ORDER — SODIUM CHLORIDE 0.9 % IV SOLN
2.0000 g | Freq: Two times a day (BID) | INTRAVENOUS | Status: DC
  Administered 2019-05-15 – 2019-05-18 (×6): 2 g via INTRAVENOUS
  Filled 2019-05-15 (×9): qty 2

## 2019-05-15 MED ORDER — PUREFLOW DIALYSIS SOLUTION
INTRAVENOUS | Status: DC
  Administered 2019-05-15: 15:00:00 via INTRAVENOUS_CENTRAL
  Administered 2019-05-15: 22:00:00 1500 mL via INTRAVENOUS_CENTRAL
  Administered 2019-05-17: 02:00:00 via INTRAVENOUS_CENTRAL

## 2019-05-15 MED ORDER — MAGNESIUM SULFATE 2 GM/50ML IV SOLN
2.0000 g | Freq: Once | INTRAVENOUS | Status: AC
  Administered 2019-05-15: 2 g via INTRAVENOUS
  Filled 2019-05-15: qty 50

## 2019-05-15 MED ORDER — HEPARIN SODIUM (PORCINE) 1000 UNIT/ML DIALYSIS
1000.0000 [IU] | INTRAMUSCULAR | Status: DC | PRN
  Administered 2019-05-17: 1000 [IU] via INTRAVENOUS_CENTRAL
  Administered 2019-05-18: 4000 [IU] via INTRAVENOUS_CENTRAL
  Filled 2019-05-15: qty 1
  Filled 2019-05-15 (×3): qty 6

## 2019-05-15 NOTE — Progress Notes (Signed)
Patient vitals stabilizing after rinse back.

## 2019-05-15 NOTE — Progress Notes (Signed)
[facility]  ROUNDING NOTE   Subjective:  Patient seen at bedside. Remains critically ill. Still on the ventilator. Did not tolerate hemodialysis very well yesterday. Case discussed with critical care. We have decided to restart the patient on CRRT.  Objective:  Vital signs in last 24 hours:  Temp:  [97.5 F (36.4 C)-99.4 F (37.4 C)] 99.3 F (37.4 C) (06/12 1200) Pulse Rate:  [72-109] 83 (06/12 1200) Resp:  [0-26] 0 (06/12 1200) BP: (75-199)/(44-135) 112/49 (06/12 1200) SpO2:  [92 %-100 %] 93 % (06/12 1239) FiO2 (%):  [65 %-100 %] 65 % (06/12 1239) Weight:  [147.9 kg] 147.9 kg (06/12 0430)  Weight change: 0.907 kg Filed Weights   05/13/19 0202 05/14/19 0500 05/15/19 0430  Weight: (!) 140.1 kg (!) 147 kg (!) 147.9 kg    Intake/Output: I/O last 3 completed shifts: In: 3640.2 [I.V.:1442.5; NG/GT:1340; IV Piggyback:857.7] Out: 600 [Emesis/NG output:100; Other:500]   Intake/Output this shift:  Total I/O In: 234.3 [I.V.:234.3] Out: -   Physical Exam: General: Critically ill appearing  HEENT ET tube, OG tube  Neck: supple  Lungs:  Bilateral rhonchi, vent assisted  Heart: Regular rate and rhythm  Abdomen:  Soft, nontender, obese  Extremities: + peripheral edema.  Neurologic: Sedated  Skin: warm  Access: thrombosed left arm AVG, rt fem temp cath replaced 6/8    Basic Metabolic Panel: Recent Labs  Lab 05/06/2019 0414  05/05/2019 0518  05/13/19 0558 05/13/19 0604 05/13/19 0902 05/13/19 1348 05/13/19 1716 05/14/19 0429 05/15/19 0326  NA 137   < > 137   < >  --  137 137 137 135 136  --   K 3.8   < > 3.7   < >  --  4.6 4.2 4.3 4.4 4.6  --   CL 101   < > 103   < >  --  104 104 104 102 103  --   CO2 25   < > 22   < >  --  23 23 25 23  21*  --   GLUCOSE 179*   < > 168*   < >  --  263* 238* 210* 251* 297*  --   BUN 34*   < > 42*   < >  --  48* 49* 49* 50* 65*  --   CREATININE 4.35*   < > 4.51*   < >  --  4.20* 3.98* 3.73* 3.58* 4.23*  --   CALCIUM 8.1*    < > 7.9*   < >  --  7.6* 7.8* 7.9* 7.7* 7.7*  --   MG 1.9  --  2.1  --  2.0  --   --   --   --  1.9 1.8  PHOS 3.7   < > 3.7   < >  --  3.4 3.0 2.7 3.2 3.4  --    < > = values in this interval not displayed.    Liver Function Tests: Recent Labs  Lab 05/13/19 0604 05/13/19 0902 05/13/19 1348 05/13/19 1716 05/14/19 0429  ALBUMIN 2.1* 2.3* 2.4* 2.3* 2.3*   No results for input(s): LIPASE, AMYLASE in the last 168 hours. No results for input(s): AMMONIA in the last 168 hours.  CBC: Recent Labs  Lab 05/09/19 0507  05/19/2019 0414 05/12/19 0518 05/13/19 0558 05/13/19 1943 05/14/19 0429 05/15/19 0326  WBC 16.6*   < > 14.7* 15.7* 11.2*  --  11.7* 15.8*  NEUTROABS 12.8*  --   --  13.7*  --   --  9.5* 11.7*  HGB 8.6*   < > 7.9* 7.6* 6.9* 8.4* 7.9* 7.1*  HCT 28.6*   < > 25.3* 24.1* 22.5* 26.8* 24.8* 22.4*  MCV 97.9   < > 95.8 93.1 97.8  --  93.9 94.9  PLT 304   < > 175 163 145*  --  132* 145*   < > = values in this interval not displayed.    Cardiac Enzymes: No results for input(s): CKTOTAL, CKMB, CKMBINDEX, TROPONINI in the last 168 hours.  BNP: Invalid input(s): POCBNP  CBG: Recent Labs  Lab 05/14/19 1948 05/14/19 2353 05/15/19 0326 05/15/19 0734 05/15/19 1153  GLUCAP 223* 116* 145* 231* 165*    Microbiology: Results for orders placed or performed during the hospital encounter of 05/01/2019  SARS Coronavirus 2 (CEPHEID - Performed in [place] hospital lab), Hosp Order     Status: None   Collection Time: 04/27/2019  7:31 AM   Specimen: Nasopharyngeal Swab  Result Value Ref Range Status   SARS Coronavirus 2 NEGATIVE NEGATIVE Final    Comment: (NOTE) If result is NEGATIVE SARS-CoV-2 target nucleic acids are NOT DETECTED. The SARS-CoV-2 RNA is generally detectable in upper and lower  respiratory specimens during the acute phase of infection. The lowest  concentration of SARS-CoV-2 viral copies this assay can detect is 250  copies / mL. A negative result does not  preclude SARS-CoV-2 infection  and should not be used as the sole basis for treatment or other  patient management decisions.  A negative result may occur with  improper specimen collection / handling, submission of specimen other  than nasopharyngeal swab, presence of viral mutation(s) within the  areas targeted by this assay, and inadequate number of viral copies  (<250 copies / mL). A negative result must be combined with clinical  observations, patient history, and epidemiological information. If result is POSITIVE SARS-CoV-2 target nucleic acids are DETECTED. The SARS-CoV-2 RNA is generally detectable in upper and lower  respiratory specimens dur ing the acute phase of infection.  Positive  results are indicative of active infection with SARS-CoV-2.  Clinical  correlation with patient history and other diagnostic information is  necessary to determine patient infection status.  Positive results do  not rule out bacterial infection or co-infection with other viruses. If result is PRESUMPTIVE POSTIVE SARS-CoV-2 nucleic acids MAY BE PRESENT.   A presumptive positive result was obtained on the submitted specimen  and confirmed on repeat testing.  While 2019 novel coronavirus  (SARS-CoV-2) nucleic acids may be present in the submitted sample  additional confirmatory testing may be necessary for epidemiological  and / or clinical management purposes  to differentiate between  SARS-CoV-2 and other Sarbecovirus currently known to infect humans.  If clinically indicated additional testing with an alternate test  methodology [phone]) is advised. The SARS-CoV-2 RNA is generally  detectable in upper and lower respiratory sp ecimens during the acute  phase of infection. The expected result is Negative. Fact Sheet for Patients:  StrictlyIdeas.no Fact Sheet for Healthcare Providers: BankingDealers.co.za This test is not yet approved or cleared by  the [place] FDA and has been authorized for detection and/or diagnosis of SARS-CoV-2 by FDA under an Emergency Use Authorization (EUA).  This EUA will remain in effect (meaning this test can be used) for the duration of the COVID-19 declaration under Section 564(b)(1) of the Act, 21 U.S.C. section 360bbb-3(b)(1), unless the authorization is terminated or revoked sooner. Performed at [facility], [address] [place],  [place] [address]   MRSA PCR Screening     Status: None   Collection Time: 04/14/2019  9:04 AM   Specimen: Nasal Mucosa; Nasopharyngeal  Result Value Ref Range Status   MRSA by PCR NEGATIVE NEGATIVE Final    Comment:        The GeneXpert MRSA Assay (FDA approved for NASAL specimens only), is one component of a comprehensive MRSA colonization surveillance program. It is not intended to diagnose MRSA infection nor to guide or monitor treatment for MRSA infections. Performed at [facility], [address] [place], [place] [address]   Culture, respiratory (non-expectorated)     Status: None   Collection Time: 05/01/19 11:05 AM   Specimen: Tracheal Aspirate; Respiratory  Result Value Ref Range Status   Specimen Description   Final    TRACHEAL ASPIRATE Performed at [facility], [address] [place], [place] [address]    Special Requests   Final    NONE Performed at [facility], [address] [place], [place] [address]    Gram Stain   Final    MODERATE WBC PRESENT, PREDOMINANTLY PMN RARE YEAST Performed at [facility], [place] [address] [place], [place] [address]    Culture FEW CANDIDA ALBICANS FEW CANDIDA TROPICALIS   Final   Report Status 05/04/2019 FINAL  Final  Culture, respiratory     Status: None   Collection Time: 05/09/19  6:30 PM   Specimen: INDUCED SPUTUM  Result Value Ref Range Status   Specimen Description   Final    INDUCED SPUTUM Performed at [facility], [address] [place], [place] [address]    Special Requests   Final    NONE Performed at [facility], [address], [place], [place] [address]    Gram Stain   Final    MODERATE WBC PRESENT, PREDOMINANTLY PMN FEW GRAM NEGATIVE RODS Performed at [facility], [place] [address] [place], [place] [address]    Culture MODERATE PSEUDOMONAS AERUGINOSA  Final   Report Status 05/23/2019 FINAL  Final   Organism ID, Bacteria PSEUDOMONAS AERUGINOSA  Final      Susceptibility   Pseudomonas aeruginosa - MIC*    CEFTAZIDIME 2 SENSITIVE Sensitive     CIPROFLOXACIN <=0.25 SENSITIVE Sensitive     GENTAMICIN <=1 SENSITIVE Sensitive     IMIPENEM 2 SENSITIVE Sensitive     PIP/TAZO <=4 SENSITIVE Sensitive     CEFEPIME <=1 SENSITIVE Sensitive     * MODERATE PSEUDOMONAS AERUGINOSA    Coagulation Studies: No results for input(s): LABPROT, INR in the last 72 hours.  Urinalysis: No results for input(s): COLORURINE, LABSPEC, PHURINE, GLUCOSEU, HGBUR, BILIRUBINUR, KETONESUR, PROTEINUR, UROBILINOGEN, NITRITE, LEUKOCYTESUR in the last 72 hours.  Invalid input(s): APPERANCEUR    Imaging: Dg Chest Port 1 View  Result Date: 05/15/2019 CLINICAL DATA:  Acute respiratory failure EXAM: PORTABLE CHEST 1 VIEW COMPARISON:  05/13/2019 FINDINGS: Cardiac shadow is stable. Endotracheal tube, gastric catheter and hero graft are again noted and stable. Mild vascular congestion is seen with small bilateral pleural effusions and bibasilar atelectasis. The overall appearance is roughly stable from the prior exam. IMPRESSION: Stable vascular congestion with effusions and atelectasis. Tubes and lines as described. Electronically Signed   By: Inez Catalina M.D.   On: 05/15/2019 08:11     Medications:   . sodium chloride 200 mL (05/10/19 1200)  . cefTAZidime (FORTAZ)  IV Stopped (05/14/19 1908)  . fentaNYL infusion INTRAVENOUS 350 mcg/hr (  05/15/19 1140)  . heparin Stopped (05/04/2019 0530)  . norepinephrine (LEVOPHED) Adult  infusion 3 mcg/min (05/15/19 1257)  . pureflow    . vasopressin (PITRESSIN) infusion - *FOR SHOCK* Stopped (05/10/19 1634)   . allopurinol  150 mg Per Tube Once per day on Tue Thu Sat  . amitriptyline  25 mg Per Tube QHS  . atorvastatin  40 mg Per Tube Daily  . budesonide (PULMICORT) nebulizer solution  0.5 mg Nebulization BID  . Chlorhexidine Gluconate Cloth  6 each Topical Daily  . citalopram  20 mg Per Tube Daily  . clopidogrel  75 mg Per Tube Daily  . clotrimazole  1 Applicatorful Vaginal QHS  . epoetin (EPOGEN/PROCRIT) injection  10,000 Units Intravenous Q T,Th,Sa-HD  . famotidine  20 mg Per Tube QHS  . feeding supplement (PRO-STAT SUGAR FREE 64)  30 mL Per Tube 5 X Daily  . feeding supplement (VITAL HIGH PROTEIN)  1,000 mL Per Tube Q24H  . free water  30 mL Per Tube Q4H  . heparin injection (subcutaneous)  5,000 Units Subcutaneous Q8H  . hydrocortisone sod succinate (SOLU-CORTEF) inj  50 mg Intravenous Q12H  . insulin aspart  0-15 Units Subcutaneous Q4H  . insulin aspart  4 Units Subcutaneous Q4H  . insulin glargine  5 Units Subcutaneous BID  . ipratropium-albuterol  3 mL Nebulization Q4H  . mouth rinse  15 mL Mouth Rinse 10 times per day  . midodrine  10 mg Per Tube TID WC  . multivitamin  15 mL Per Tube Daily  . polyethylene glycol  17 g Oral BID  . senna-docusate  1 tablet Per Tube BID  . sodium chloride flush  10-40 mL Intracatheter Q12H   albuterol, fentaNYL, fentaNYL (SUBLIMAZE) injection, heparin, heparin, lidocaine-prilocaine, LORazepam, midazolam, nystatin, polyethylene glycol, polyvinyl alcohol, sodium chloride flush, vecuronium  Assessment/ Plan:  Ms. [name] is a 63 y.o. black female with end stage renal disease on hemodialysis, hypertension, CVA, obstructive sleep apnea, peripheral vascular disease, hypertension, gout, GERD, diabetes mellitus type II, COPD, coronary artery disease, congestive heart failure, who  was admitted to [facility] on 04/17/2019 for  pneumonia. Acute respiratory failure requiring mechanical ventilation and vasopressors.   TTS [facility] left AVG   1. End stage renal disease  With generalized edema/volume overload Complication of dialysis device, clotted AVG.  Started on CRRT June 6 -Patient did not tolerate hemodialysis very well at all yesterday.  Therefore we will transition the patient back to CRRT with ultrafiltration target of 75 cc/h for now.  2. Anemia of chronic kidney disease:  Lab Results  Component Value Date   HGB 7.1 (L) 05/15/2019   -Hemoglobin has trended down to 7.1.  Consider blood transfusion for hemoglobin of 7 or less.  3. Hypotension:  -As the patient is restarting CRRT we may need to consider starting the patient back on pressors to achieve ultrafiltration.  4. Acute respiratory failure with cardiopulmonary arrest. Extubated 6/2, re-intubated 6/5 Patient remains on the ventilator at this time.  She is not deemed to be a candidate for tracheostomy given high levels of oxygen requirement at the moment.  Continue current ventilatory support.    LOS: 17 Munsoor Lateef 6/12/20201:48 PM

## 2019-05-15 NOTE — Progress Notes (Signed)
[place] at [place] NAME: [name]    MR#:  [id number]  DATE OF BIRTH:  Nov 03, 1956     Patient remains critically ill.  Still on the ventilator.  REVIEW OF SYSTEMS:   Unable to obtain due to patient being intubated and sedated  DRUG ALLERGIES:   Allergies  Allergen Reactions  . Iodine Rash  . Enalapril Other (See Comments)    TONGUE SWELLS/GOUT  . Iodinated Diagnostic Agents Other (See Comments)    BREAK OUT IN WHELPS      VITALS:  Blood pressure (!) 112/49, pulse 83, temperature 99.3 F (37.4 C), temperature source Oral, resp. rate (!) 0, height 5' 5.98" (1.676 m), weight (!) 147.9 kg, SpO2 93 %.  PHYSICAL EXAMINATION:  GENERAL:  63 y.o.-morbidly obese and critically ill-appearing. EYES: Pupils equal, round, reactive to light  No scleral icterus.  HEENT: Head atraumatic, normocephalic.  Currently intubated, sedated. ETT in place. NECK:  Supple, no jugular venous distention. No thyroid enlargement, no tenderness.  LUNGS: +diffuse rhonchi and wheezing present CARDIOVASCULAR: RRR, S1, S2 normal. No murmurs, rubs, or gallops.  ABDOMEN: Soft, nontender, nondistended. Bowel sounds present. No organomegaly or mass.  EXTREMITIES: +pedal edema and bilateral hand edema, no cyanosis or clubbing.  NEUROLOGIC: Unable to do neuro exam because of intubation, sedation. PSYCHIATRIC: Intubated, sedated.   SKIN: No obvious rash, lesion, or ulcer.   LABORATORY PANEL:   CBC Recent Labs  Lab 05/15/19 0326  WBC 15.8*  HGB 7.1*  HCT 22.4*  PLT 145*   ------------------------------------------------------------------------------------------------------------------  Chemistries  Recent Labs  Lab 05/14/19 0429 05/15/19 0326  NA 136  --   K 4.6  --   CL 103  --   CO2 21*  --   GLUCOSE 297*  --   BUN 65*  --   CREATININE 4.23*  --   CALCIUM 7.7*  --   MG 1.9 1.8    ------------------------------------------------------------------------------------------------------------------  Cardiac Enzymes No results for input(s): TROPONINI in the last 168 hours. ------------------------------------------------------------------------------------------------------------------  RADIOLOGY:  Dg Chest Port 1 View  Result Date: 05/15/2019 CLINICAL DATA:  Acute respiratory failure EXAM: PORTABLE CHEST 1 VIEW COMPARISON:  05/13/2019 FINDINGS: Cardiac shadow is stable. Endotracheal tube, gastric catheter and hero graft are again noted and stable. Mild vascular congestion is seen with small bilateral pleural effusions and bibasilar atelectasis. The overall appearance is roughly stable from the prior exam. IMPRESSION: Stable vascular congestion with effusions and atelectasis. Tubes and lines as described. Electronically Signed   By: Inez Catalina M.D.   On: 05/15/2019 08:11    EKG:   Orders placed or performed during the hospital encounter of 04/05/2019  . EKG 12-Lead  . EKG 12-Lead  . EKG 12-Lead  . EKG 12-Lead    ASSESSMENT AND PLAN:   63 year old female patient with history of CVA, obstructive sleep apnea on CPAP, ESRD on hemodialysis Tuesday, Thursday, Saturday, congestive heart failure, COPD comes in because of worsening shortness of breath, she was called in the emergency room, had PEA, intubated, admitted to intensive care unit.  Acute hypoxic and hypercapneic respiratory failure- status post PEA arrest. Also with aspiration pneumonia -Extubated 6/2 and then had to be reintubated 6/5 -Vent management per CCM -Plan was for trach, but patient unable to obtain trach due to severe hypoxia -Continue Zosyn -Sputum cultures growing pseudomonas  Shock- multi-factorial due to sepsis/hypovolemia/cardiogenic -On multiple pressors -Continue midodrine tid -Abx as above  ESRD- on hemodialysis TTS. Temporary HD  cath placed 6/5. -Nephrology following -Plan to place  patient back on CRRT today.  Type 2 diabetes- blood sugars well controlled -Lantus and SSI  Hyperlipidemia- stable -Continue Lipitor  History of stroke -Continue Plavix  COPD- stable, no signs of acute exacerbation. -Continue nebs  Anemia in chronic kidney disease- hemoglobin low but stable. -EPO per nephrology   All the records are reviewed and case discussed with Care Management/Social Workerr. Management plans discussed with the patient, family and they are in agreement.  CODE STATUS: full  TOTAL TIME TAKING CARE OF THIS PATIENT: 28 minutes.   Berna Spare Mayo M.D on 05/15/2019 at 1:58 PM  Between 7am to 6pm - Pager - [phone]  After 6pm go to [email or web address] - password [email or web address] [place] [facility]  Office  [phone]  CC: Primary care physician; Smithson, [name], MD   Note: This dictation was prepared with Dragon dictation along with smaller phrase technology. Any transcriptional errors that result from this process are unintentional.

## 2019-05-15 NOTE — TOC Progression Note (Signed)
Transition of Care [facility]) - Progression Note    Patient Details  Name: [name] MRN: [id number] Date of Birth: Apr 16, 1956  [facility] of [facility]) CM/SW Contact  Shela Leff,  Phone Number: 05/15/2019, 1:26 PM  Clinical Narrative:   Patient continues on vent with CRRT. In rounds it was reported that she was able to respond to family. CSW will continue to follow.     Expected Discharge Plan: (unsure of discharge needs at this time) Barriers to Discharge: Continued Medical Work up  Expected Discharge Plan and Services Expected Discharge Plan: (unsure of discharge needs at this time)   Discharge Planning Services: CM Consult   Living arrangements for the past 2 months: Single Family Home                                       Social Determinants of Health (SDOH) Interventions    Readmission Risk Interventions No flowsheet data found.

## 2019-05-15 NOTE — Consult Note (Signed)
Pharmacy Antibiotic Note  [name] is a 63 y.o. female admitted on 04/06/2019 with pneumonia. Leukocytosis improved initially but is now trending up. Right femoral dialysis catheter replaced 6/8. CRRT discontinued on 6/10 and patient was attempted to have dialysis on 6/11. However, hypotension prevented completion of the session and CRRT is being re-initiated  Plan: increase ceftazadime to 2g IV Q12hr    Height: 5' 5.98" (167.6 cm) Weight: (!) 326 lb (147.9 kg) IBW/kg (Calculated) : 59.26  Temp (24hrs), Avg:98.6 F (37 C), Min:97.5 F (36.4 C), Max:99.4 F (37.4 C)  Recent Labs  Lab 05/26/2019 0414  05/19/2019 0518  05/13/19 0558 05/13/19 0604 05/13/19 0902 05/13/19 1348 05/13/19 1716 05/14/19 0429 05/15/19 0326  WBC 14.7*  --  15.7*  --  11.2*  --   --   --   --  11.7* 15.8*  CREATININE 4.35*   < > 4.51*   < >  --  4.20* 3.98* 3.73* 3.58* 4.23*  --    < > = values in this interval not displayed.    Estimated Creatinine Clearance: 20.1 mL/min (A) (by C-G formula based on SCr of 4.23 mg/dL (H)).    Antimicrobials this admission:  Ceftazidime 6/11 Zosyn 6/ 6>> 6/11 Vancomycin 5/26 >> 5/27 Cefepime 5/26 >> 5/29 Azithromycin 5/29 >> 6/2 Doxycyline 5/29 >> 6/2 Ceftazidime  6/11 >> 6/13  Microbiology results:  5/26 MRSA PCR: neg 5/29 RCx few C albicans, C tropicalis 6/6 Sputum Culture: pseudomonas aeruginosa    Thank you for allowing pharmacy to be a part of this patient's care.  Dallie Piles, PharmD 05/15/2019 9:52 AM

## 2019-05-15 NOTE — Progress Notes (Signed)
CRITICAL CARE NOTE  CC  follow up respiratory failure  SUBJECTIVE Patient remains critically ill Prognosis is guarded Multiorgan failure +aspirtaion pneumonia Severe hypoxia  Vent Mode: PRVC FiO2 (%):  [75 %-100 %] 75 % Set Rate:  [26 bmp] 26 bmp Vt Set:  [480 mL] 480 mL PEEP:  [15 cmH20] 15 cmH20 Plateau Pressure:  [30 cmH20] 30 cmH20    BP (!) 91/52   Pulse 80   Temp 98.8 F (37.1 C) (Axillary)   Resp (!) 0   Ht 5' 5.98" (1.676 m)   Wt (!) 147.9 kg   SpO2 100%   BMI 52.64 kg/m    I/O last 3 completed shifts: In: 3264.2 [I.V.:1066.5; NG/GT:1340; IV Piggyback:857.7] Out: 600 [Emesis/NG output:100; Other:500] No intake/output data recorded.  SpO2: 100 % O2 Flow Rate (L/min): 15 L/min FiO2 (%): 75 %   SIGNIFICANT EVENTS 5/26 ICU admission for resp failure and cardiac arrest 5/26 hypothermia protocol 6/3 successfully extubated 6/5 re-admitted to the ICU for aspiration pneumonia and progressive diastolic heart failure and renal failure 6/6 plan for [facility], ENT consulted, remains critically ill on vent 6/6 near cardiac arrest, fio2 100%, PEEP 15 6/7 severe resp failure, multiorgan failure, husband and daughter updated 6/8 unable to obtain trach due to severe hypoxia 6/10 severe hypoxia fio2 100% 6/11 severe hypoxia 75%  REVIEW OF SYSTEMS  PATIENT IS UNABLE TO PROVIDE COMPLETE REVIEW OF SYSTEMS DUE TO SEVERE CRITICAL ILLNESS   PHYSICAL EXAMINATION:  GENERAL:critically ill appearing, +resp distress HEAD: Normocephalic, atraumatic.  EYES: Pupils equal, round, reactive to light.  No scleral icterus.  MOUTH: Moist mucosal membrane. NECK: Supple. No thyromegaly. No nodules. No JVD.  PULMONARY: +rhonchi, +wheezing CARDIOVASCULAR: S1 and S2. Regular rate and rhythm. No murmurs, rubs, or gallops.  GASTROINTESTINAL: Soft, nontender, -distended. No masses. Positive bowel sounds. No hepatosplenomegaly.  MUSCULOSKELETAL: No swelling, clubbing, or edema.  NEUROLOGIC:  obtunded, GCS<8 SKIN:intact,warm,dry  MEDICATIONS: I have reviewed all medications and confirmed regimen as documented   CULTURE RESULTS   Recent Results (from the past 240 hour(s))  Culture, respiratory     Status: None   Collection Time: 05/09/19  6:30 PM   Specimen: INDUCED SPUTUM  Result Value Ref Range Status   Specimen Description   Final    INDUCED SPUTUM Performed at [facility], [address] [place], [place] [address]    Special Requests   Final    NONE Performed at [facility], [address], [place], [place] [address]    Gram Stain   Final    MODERATE WBC PRESENT, PREDOMINANTLY PMN FEW GRAM NEGATIVE RODS Performed at [facility], [place] [address], [place], [place] [address]    Culture MODERATE PSEUDOMONAS AERUGINOSA  Final   Report Status 05/28/2019 FINAL  Final   Organism ID, Bacteria PSEUDOMONAS AERUGINOSA  Final      Susceptibility   Pseudomonas aeruginosa - MIC*    CEFTAZIDIME 2 SENSITIVE Sensitive     CIPROFLOXACIN <=0.25 SENSITIVE Sensitive     GENTAMICIN <=1 SENSITIVE Sensitive     IMIPENEM 2 SENSITIVE Sensitive     PIP/TAZO <=4 SENSITIVE Sensitive     CEFEPIME <=1 SENSITIVE Sensitive     * MODERATE PSEUDOMONAS AERUGINOSA         CBC    Component Value Date/Time   WBC 15.8 (H) 05/15/2019 0326   RBC 2.36 (L) 05/15/2019 0326   HGB 7.1 (L) 05/15/2019 0326   HGB 10.5 (L) 03/28/2015 0137   HCT  22.4 (L) 05/15/2019 0326   HCT 31.9 (L) 03/28/2015 0137   PLT 145 (L) 05/15/2019 0326   PLT 302 03/28/2015 0137   MCV 94.9 05/15/2019 0326   MCV 90 03/28/2015 0137   MCH 30.1 05/15/2019 0326   MCHC 31.7 05/15/2019 0326   RDW 19.9 (H) 05/15/2019 0326   RDW 14.9 (H) 03/28/2015 0137   LYMPHSABS 1.0 05/15/2019 0326   LYMPHSABS 1.7 03/24/2015 0531   MONOABS 1.9 (H) 05/15/2019 0326   MONOABS 1.2 (H) 03/24/2015 0531   EOSABS 0.1 05/15/2019 0326   EOSABS 0.0 03/24/2015 0531   BASOSABS 0.0 05/15/2019 0326   BASOSABS 0.2 (H)  03/24/2015 0531   BMP Latest Ref Rng & Units 05/14/2019 05/13/2019 05/13/2019  Glucose 70 - 99 mg/dL 297(H) 251(H) 210(H)  BUN 8 - 23 mg/dL 65(H) 50(H) 49(H)  Creatinine 0.44 - 1.00 mg/dL 4.23(H) 3.58(H) 3.73(H)  Sodium 135 - 145 mmol/L 136 135 137  Potassium 3.5 - 5.1 mmol/L 4.6 4.4 4.3  Chloride 98 - 111 mmol/L 103 102 104  CO2 22 - 32 mmol/L 21(L) 23 25  Calcium 8.9 - 10.3 mg/dL 7.7(L) 7.7(L) 7.9(L)      Indwelling Urinary Catheter continued, requirement due to   Reason to continue Indwelling Urinary Catheter strict Intake/Output monitoring for hemodynamic instability   Central Line/ continued, requirement due to  Reason to continue [place] of central venous pressure or other hemodynamic parameters and poor IV access   Ventilator continued, requirement due to severe respiratory failure   Ventilator Sedation RASS 0 to -2      ASSESSMENT AND PLAN SYNOPSIS   63 yo morbidly obese AAF with severe end stage renal disease s/p cardiac arrest and hypothermia protocol with recurrent resp failure from acute diastolic heart failure and aspiration pneumonia  Due to her underlying end stage diastolic heart failure and end stage renal disease with recurrent resp failure and significant morbid obesity, she well need Trach for survival. Sister and Husband notified of clinical status. Patient unable to obtain Trach due to severe hypoxia   Severe ACUTE Hypoxic and Hypercapnic Respiratory Failure -continue Full MV support -continue Bronchodilator Therapy -Wean Fio2 and PEEP as tolerated   ACUTE DIASTOLIC CARDIAC FAILURE-    CHRONIC  KIDNEY INJURY/Renal Failure -follow chem 7 -follow UO -continue Foley Catheter-assess need -Avoid nephrotoxic agents -Recheck creatinine  On HD as tolerated   NEUROLOGY - intubated and sedated - minimal sedation to achieve a RASS goal: -1    CARDIAC ICU monitoring  ID -continue IV abx as prescibed -follow up  cultures  GI GI PROPHYLAXIS as indicated  NUTRITIONAL STATUS DIET-->TF's as tolerated Constipation protocol as indicated  ENDO - will use ICU hypoglycemic\Hyperglycemia protocol if indicated   ELECTROLYTES -follow labs as needed -replace as needed -pharmacy consultation and following   DVT/GI PRX ordered TRANSFUSIONS AS NEEDED MONITOR FSBS ASSESS the need for LABS as needed   Critical Care Time devoted to patient care services described in this note is 32 minutes.   Overall, patient is critically ill, prognosis is guarded.  Patient with Multiorgan failure and at high risk for cardiac arrest and death.   [place]  Corrin Parker, M.D.  [facility]  Medical Director [place] Director [facility] Cardio-Pulmonary Department

## 2019-05-15 NOTE — Consult Note (Signed)
PHARMACY CONSULT NOTE - FOLLOW UP  Pharmacy Consult for Electrolyte Monitoring and Replacement   Recent Labs: Potassium (mmol/L)  Date Value  05/14/2019 4.6  03/31/2015 4.2   Magnesium (mg/dL)  Date Value  05/15/2019 1.8  06/14/2014 1.8   Calcium (mg/dL)  Date Value  05/14/2019 7.7 (L)   Calcium, Total (mg/dL)  Date Value  03/31/2015 7.0 (LL)   Albumin (g/dL)  Date Value  05/14/2019 2.3 (L)  03/31/2015 2.3 (L)   Phosphorus (mg/dL)  Date Value  05/14/2019 3.4  03/31/2015 3.6   Sodium (mmol/L)  Date Value  05/14/2019 136  03/31/2015 129 (L)   Corrected Ca: 9.06 mg/dL  Assessment: 63 y.o. female admitted on 04/25/2019 with pneumonia. CRRT discontinued on 6/10 and patient was attempted to have dialysis on 6/11. However, hypotension prevented completion of the session and CRRT is being re-initiated  Goal of Therapy:  Potassium ~ 4, magnesium ~ 2  Plan:   Last magnesium 1.8: replace with 2 grams IV magnesium sulfate  Renal function panel is ordered every 4 hours: pharmacy will replace as needed  Dallie Piles ,PharmD Clinical Pharmacist 05/15/2019 3:46 PM

## 2019-05-15 NOTE — Consult Note (Signed)
PHARMACY CONSULT NOTE - FOLLOW UP  Pharmacy Consult for Electrolyte Monitoring and Replacement   Recent Labs: Potassium (mmol/L)  Date Value  05/15/2019 4.0  03/31/2015 4.2   Magnesium (mg/dL)  Date Value  05/15/2019 1.8  06/14/2014 1.8   Calcium (mg/dL)  Date Value  05/15/2019 7.5 (L)   Calcium, Total (mg/dL)  Date Value  03/31/2015 7.0 (LL)   Albumin (g/dL)  Date Value  05/15/2019 2.3 (L)  03/31/2015 2.3 (L)   Phosphorus (mg/dL)  Date Value  05/15/2019 2.4 (L)  03/31/2015 3.6   Sodium (mmol/L)  Date Value  05/15/2019 138  03/31/2015 129 (L)   Corrected Ca: 9.06 mg/dL  Assessment: 63 y.o. female admitted on 04/09/2019 with pneumonia. CRRT discontinued on 6/10 and patient was attempted to have dialysis on 6/11. However, hypotension prevented completion of the session and CRRT is being re-initiated  Goal of Therapy:  Potassium ~ 4, magnesium ~ 2  Plan:   Last magnesium 1.8: will order 2g Mag sulfate IV x 1 dose  Renal function panel is ordered every 4 hours: pharmacy will replace as needed  Pearla Dubonnet ,PharmD Clinical Pharmacist 05/15/2019 5:29 PM

## 2019-05-15 NOTE — Progress Notes (Signed)
Terminated tx due to elevated diastolic pressure. Increase in heart rate and  New onset of  premature ventricular contractions.

## 2019-05-16 LAB — CBC WITH DIFFERENTIAL/PLATELET
Abs Immature Granulocytes: 0.8 10*3/uL — ABNORMAL HIGH (ref 0.00–0.07)
Basophils Absolute: 0 10*3/uL (ref 0.0–0.1)
Basophils Relative: 0 %
Eosinophils Absolute: 0 10*3/uL (ref 0.0–0.5)
Eosinophils Relative: 0 %
HCT: 22.8 % — ABNORMAL LOW (ref 36.0–46.0)
Hemoglobin: 6.9 g/dL — ABNORMAL LOW (ref 12.0–15.0)
Immature Granulocytes: 6 %
Lymphocytes Relative: 9 %
Lymphs Abs: 1.2 10*3/uL (ref 0.7–4.0)
MCH: 29.9 pg (ref 26.0–34.0)
MCHC: 30.3 g/dL (ref 30.0–36.0)
MCV: 98.7 fL (ref 80.0–100.0)
Monocytes Absolute: 1.2 10*3/uL — ABNORMAL HIGH (ref 0.1–1.0)
Monocytes Relative: 9 %
Neutro Abs: 9.7 10*3/uL — ABNORMAL HIGH (ref 1.7–7.7)
Neutrophils Relative %: 76 %
Platelets: 155 10*3/uL (ref 150–400)
RBC: 2.31 MIL/uL — ABNORMAL LOW (ref 3.87–5.11)
RDW: 21 % — ABNORMAL HIGH (ref 11.5–15.5)
Smear Review: ADEQUATE
WBC: 12.8 10*3/uL — ABNORMAL HIGH (ref 4.0–10.5)
nRBC: 2.4 % — ABNORMAL HIGH (ref 0.0–0.2)

## 2019-05-16 LAB — RENAL FUNCTION PANEL
Albumin: 2.7 g/dL — ABNORMAL LOW (ref 3.5–5.0)
Albumin: 2.5 g/dL — ABNORMAL LOW (ref 3.5–5.0)
Albumin: 2.6 g/dL — ABNORMAL LOW (ref 3.5–5.0)
Albumin: 2.3 g/dL — ABNORMAL LOW (ref 3.5–5.0)
Albumin: 2.7 g/dL — ABNORMAL LOW (ref 3.5–5.0)
Anion gap: 11 (ref 5–15)
Anion gap: 10 (ref 5–15)
Anion gap: 12 (ref 5–15)
Anion gap: 11 (ref 5–15)
Anion gap: 11 (ref 5–15)
BUN: 49 mg/dL — ABNORMAL HIGH (ref 8–23)
BUN: 44 mg/dL — ABNORMAL HIGH (ref 8–23)
BUN: 39 mg/dL — ABNORMAL HIGH (ref 8–23)
BUN: 37 mg/dL — ABNORMAL HIGH (ref 8–23)
BUN: 36 mg/dL — ABNORMAL HIGH (ref 8–23)
CO2: 26 mmol/L (ref 22–32)
CO2: 26 mmol/L (ref 22–32)
CO2: 25 mmol/L (ref 22–32)
CO2: 23 mmol/L (ref 22–32)
CO2: 24 mmol/L (ref 22–32)
Calcium: 8.1 mg/dL — ABNORMAL LOW (ref 8.9–10.3)
Calcium: 8.1 mg/dL — ABNORMAL LOW (ref 8.9–10.3)
Calcium: 8.2 mg/dL — ABNORMAL LOW (ref 8.9–10.3)
Calcium: 7.9 mg/dL — ABNORMAL LOW (ref 8.9–10.3)
Calcium: 8.3 mg/dL — ABNORMAL LOW (ref 8.9–10.3)
Chloride: 100 mmol/L (ref 98–111)
Chloride: 102 mmol/L (ref 98–111)
Chloride: 100 mmol/L (ref 98–111)
Chloride: 102 mmol/L (ref 98–111)
Chloride: 102 mmol/L (ref 98–111)
Creatinine, Ser: 3.31 mg/dL — ABNORMAL HIGH (ref 0.44–1.00)
Creatinine, Ser: 2.94 mg/dL — ABNORMAL HIGH (ref 0.44–1.00)
Creatinine, Ser: 2.64 mg/dL — ABNORMAL HIGH (ref 0.44–1.00)
Creatinine, Ser: 2.59 mg/dL — ABNORMAL HIGH (ref 0.44–1.00)
Creatinine, Ser: 2.6 mg/dL — ABNORMAL HIGH (ref 0.44–1.00)
GFR calc Af Amer: 16 mL/min — ABNORMAL LOW (ref >60)
GFR calc Af Amer: 19 mL/min — ABNORMAL LOW (ref >60)
GFR calc Af Amer: 21 mL/min — ABNORMAL LOW (ref >60)
GFR calc Af Amer: 22 mL/min — ABNORMAL LOW (ref >60)
GFR calc Af Amer: 22 mL/min — ABNORMAL LOW (ref >60)
GFR calc non Af Amer: 14 mL/min — ABNORMAL LOW (ref >60)
GFR calc non Af Amer: 16 mL/min — ABNORMAL LOW (ref >60)
GFR calc non Af Amer: 18 mL/min — ABNORMAL LOW (ref >60)
GFR calc non Af Amer: 19 mL/min — ABNORMAL LOW (ref >60)
GFR calc non Af Amer: 19 mL/min — ABNORMAL LOW (ref >60)
Glucose, Bld: 151 mg/dL — ABNORMAL HIGH (ref 70–99)
Glucose, Bld: 136 mg/dL — ABNORMAL HIGH (ref 70–99)
Glucose, Bld: 122 mg/dL — ABNORMAL HIGH (ref 70–99)
Glucose, Bld: 124 mg/dL — ABNORMAL HIGH (ref 70–99)
Glucose, Bld: 139 mg/dL — ABNORMAL HIGH (ref 70–99)
Phosphorus: 2.9 mg/dL (ref 2.5–4.6)
Phosphorus: 2.7 mg/dL (ref 2.5–4.6)
Phosphorus: 2.7 mg/dL (ref 2.5–4.6)
Phosphorus: 3 mg/dL (ref 2.5–4.6)
Phosphorus: 3.2 mg/dL (ref 2.5–4.6)
Potassium: 4.5 mmol/L (ref 3.5–5.1)
Potassium: 4.6 mmol/L (ref 3.5–5.1)
Potassium: 4.4 mmol/L (ref 3.5–5.1)
Potassium: 5.4 mmol/L — ABNORMAL HIGH (ref 3.5–5.1)
Potassium: 4.5 mmol/L (ref 3.5–5.1)
Sodium: 137 mmol/L (ref 135–145)
Sodium: 138 mmol/L (ref 135–145)
Sodium: 137 mmol/L (ref 135–145)
Sodium: 136 mmol/L (ref 135–145)
Sodium: 137 mmol/L (ref 135–145)

## 2019-05-16 LAB — CBC
HCT: 27.5 % — ABNORMAL LOW (ref 36.0–46.0)
Hemoglobin: 8.4 g/dL — ABNORMAL LOW (ref 12.0–15.0)
MCH: 30.5 pg (ref 26.0–34.0)
MCHC: 30.5 g/dL (ref 30.0–36.0)
MCV: 100 fL (ref 80.0–100.0)
Platelets: 157 10*3/uL (ref 150–400)
RBC: 2.75 MIL/uL — ABNORMAL LOW (ref 3.87–5.11)
RDW: 22 % — ABNORMAL HIGH (ref 11.5–15.5)
WBC: 17 10*3/uL — ABNORMAL HIGH (ref 4.0–10.5)
nRBC: 3.6 % — ABNORMAL HIGH (ref 0.0–0.2)

## 2019-05-16 LAB — MAGNESIUM
Magnesium: 2.2 mg/dL (ref 1.7–2.4)
Magnesium: 2.1 mg/dL (ref 1.7–2.4)
Magnesium: 2 mg/dL (ref 1.7–2.4)
Magnesium: 1.9 mg/dL (ref 1.7–2.4)
Magnesium: 2 mg/dL (ref 1.7–2.4)

## 2019-05-16 LAB — GLUCOSE, CAPILLARY
Glucose-Capillary: 101 mg/dL — ABNORMAL HIGH (ref 70–99)
Glucose-Capillary: 105 mg/dL — ABNORMAL HIGH (ref 70–99)
Glucose-Capillary: 108 mg/dL — ABNORMAL HIGH (ref 70–99)
Glucose-Capillary: 121 mg/dL — ABNORMAL HIGH (ref 70–99)
Glucose-Capillary: 124 mg/dL — ABNORMAL HIGH (ref 70–99)
Glucose-Capillary: 137 mg/dL — ABNORMAL HIGH (ref 70–99)

## 2019-05-16 LAB — PREPARE RBC (CROSSMATCH)

## 2019-05-16 MED ORDER — ALTEPLASE 2 MG IJ SOLR
2.0000 mg | Freq: Once | INTRAMUSCULAR | Status: AC
  Administered 2019-05-16: 01:00:00 2 mg

## 2019-05-16 MED ORDER — ALTEPLASE 2 MG IJ SOLR
2.0000 mg | Freq: Once | INTRAMUSCULAR | Status: AC
  Administered 2019-05-16: 2 mg

## 2019-05-16 MED ORDER — SODIUM CHLORIDE 0.9% IV SOLUTION
Freq: Once | INTRAVENOUS | Status: AC
  Administered 2019-05-16: 12:00:00 via INTRAVENOUS

## 2019-05-16 NOTE — Progress Notes (Signed)
PHARMACY CONSULT NOTE - FOLLOW UP  Pharmacy Consult for Electrolyte Monitoring and Replacement   Recent Labs: Potassium (mmol/L)  Date Value  05/16/2019 4.5  03/31/2015 4.2   Magnesium (mg/dL)  Date Value  05/16/2019 1.9  06/14/2014 1.8   Calcium (mg/dL)  Date Value  05/16/2019 8.3 (L)   Calcium, Total (mg/dL)  Date Value  03/31/2015 7.0 (LL)   Albumin (g/dL)  Date Value  05/16/2019 2.7 (L)  03/31/2015 2.3 (L)   Phosphorus (mg/dL)  Date Value  05/16/2019 3.2  03/31/2015 3.6   Sodium (mmol/L)  Date Value  05/16/2019 137  03/31/2015 129 (L)     Assessment:63 y.o.femaleadmittedon 5/26/2020with pneumonia. CRRT discontinued on 6/10 and patient was attemptedto have dialysis on 6/11.However, hypotension prevented completion of the session and CRRT is being re-initiated  Goal of Therapy:  Potassium ~ 4, magnesium ~ 2  Plan:  6/13 0600 No electrolyte replacement warranted at this time.   6/13 @ 1919:  K = 4.5   No electrolyte supplementation needed at this time.   Renal function panel is ordered every 4 hours: pharmacy will replace as needed   Robbins,Jason D ,PharmD Clinical Pharmacist 05/16/2019 8:02 PM

## 2019-05-16 NOTE — Progress Notes (Signed)
CRRT successfully restarted.

## 2019-05-16 NOTE — Consult Note (Signed)
Pharmacy Antibiotic Note  [name] is a 63 y.o. female admitted on 04/25/2019 with pneumonia. Leukocytosis improved initially but is now trending up. Right femoral dialysis catheter replaced 6/8. CRRT discontinued on 6/10 and patient was attempted to have dialysis on 6/11. However, hypotension prevented completion of the session and CRRT is being re-initiated  Plan: Continue ceftazadime to 2g IV Q12hr    Height: 5' 5.98" (167.6 cm) Weight: (!) 337 lb (152.9 kg) IBW/kg (Calculated) : 59.26  Temp (24hrs), Avg:97.8 F (36.6 C), Min:96.7 F (35.9 C), Max:99.3 F (37.4 C)  Recent Labs  Lab 05/31/2019 0518  05/13/19 0558  05/14/19 0429 05/15/19 0326  05/15/19 1832 05/15/19 2143 05/16/19 0145 05/16/19 0555 05/16/19 1045  WBC 15.7*  --  11.2*  --  11.7* 15.8*  --   --   --   --  12.8*  --   CREATININE 4.51*   < >  --    < > 4.23*  --    < > 3.38* 3.09* 3.31* 2.94* 2.64*   < > = values in this interval not displayed.    Estimated Creatinine Clearance: 32.9 mL/min (A) (by C-G formula based on SCr of 2.64 mg/dL (H)).    Antimicrobials this admission:  Ceftazidime 6/11 Zosyn 6/ 6>> 6/11 Vancomycin 5/26 >> 5/27 Cefepime 5/26 >> 5/29 Azithromycin 5/29 >> 6/2 Doxycyline 5/29 >> 6/2 Ceftazidime  6/11 >> 6/13  Microbiology results:  5/26 MRSA PCR: neg 5/29 RCx few C albicans, C tropicalis 6/6 Sputum Culture: pseudomonas aeruginosa    Thank you for allowing pharmacy to be a part of this patient's care.  Oswald Hillock, PharmD, BCPS 05/16/2019 11:47 AM

## 2019-05-16 NOTE — Progress Notes (Signed)
Assisted tele visit to patient with family member.  Marsh, Stephanie M, RN  

## 2019-05-16 NOTE — Progress Notes (Signed)
Pt CRRT clotted off at 0045. Unable to flush back blood in cartridge. Alteplase dwelling, will continue to assess.

## 2019-05-16 NOTE — Progress Notes (Signed)
CRRT successfully restarted at 2000.

## 2019-05-16 NOTE — Consult Note (Signed)
PHARMACY CONSULT NOTE - FOLLOW UP  Pharmacy Consult for Electrolyte Monitoring and Replacement   Recent Labs: Potassium (mmol/L)  Date Value  05/16/2019 4.6  03/31/2015 4.2   Magnesium (mg/dL)  Date Value  05/16/2019 2.2  06/14/2014 1.8   Calcium (mg/dL)  Date Value  05/16/2019 8.1 (L)   Calcium, Total (mg/dL)  Date Value  03/31/2015 7.0 (LL)   Albumin (g/dL)  Date Value  05/16/2019 2.5 (L)  03/31/2015 2.3 (L)   Phosphorus (mg/dL)  Date Value  05/16/2019 2.7  03/31/2015 3.6   Sodium (mmol/L)  Date Value  05/16/2019 138  03/31/2015 129 (L)   Corrected Ca: 9.06 mg/dL  Assessment: 63 y.o. female admitted on 04/25/2019 with pneumonia. CRRT discontinued on 6/10 and patient was attempted to have dialysis on 6/11. However, hypotension prevented completion of the session and CRRT is being re-initiated  Goal of Therapy:  Potassium ~ 4, magnesium ~ 2  Plan:  6/13 0600 No electrolyte replacement warranted at this time.  Renal function panel is ordered every 4 hours: pharmacy will replace as needed  Pernell Dupre, PharmD, BCPS Clinical Pharmacist 05/16/2019 7:11 AM

## 2019-05-16 NOTE — Progress Notes (Signed)
Pt has remained alert to voice, but is not able to follow commands. Pt has remained in NSR on cardiac monitor. Pt remains on Levo for BP support and fent for pain/sedation. HR WNL. Pt has remained on the ventilator. FiO2 requirements have increased to 85% d/t desat to low 80s. Lung sounds with bilat rhonchi. Minimal secretions with ETT suction. Pt received a bath today. CRRT filter clotted around 1730. Family video visit today around 1400.

## 2019-05-16 NOTE — Progress Notes (Signed)
[facility]  ROUNDING NOTE   Subjective:  Patient remains on CRRT. Tolerating well. Still on the ventilator at this time.  Objective:  Vital signs in last 24 hours:  Temp:  [96.7 F (35.9 C)-98 F (36.7 C)] 96.8 F (36 C) (06/13 1200) Pulse Rate:  [72-80] 77 (06/13 1200) Resp:  [0-41] 26 (06/13 1200) BP: (96-166)/(39-78) 130/53 (06/13 1200) SpO2:  [87 %-100 %] 95 % (06/13 1200) FiO2 (%):  [65 %-100 %] 80 % (06/13 1200) Weight:  [152.9 kg] 152.9 kg (06/13 0500)  Weight change: 4.99 kg Filed Weights   05/14/19 0500 05/15/19 0430 05/16/19 0500  Weight: (!) 147 kg (!) 147.9 kg (!) 152.9 kg    Intake/Output: I/O last 3 completed shifts: In: 1363.4 [I.V.:1263.4; IV Piggyback:100] Out: 1099 [MCNOB:0962]   Intake/Output this shift:  Total I/O In: 380 [I.V.:40; Blood:340] Out: 39 [Other:281]  Physical Exam: General: Critically ill appearing  HEENT ET tube, OG tube  Neck: supple  Lungs:  Bilateral rhonchi, vent assisted  Heart: Regular rate and rhythm  Abdomen:  Soft, nontender, obese  Extremities: + peripheral edema.  Neurologic: Sedated  Skin: warm  Access: thrombosed left arm AVG, rt fem temp cath replaced 6/8    Basic Metabolic Panel: Recent Labs  Lab 05/15/19 1832 05/15/19 2143 05/16/19 0145 05/16/19 0555 05/16/19 1045  NA 136 137 137 138 137  K 4.3 4.2 4.5 4.6 4.4  CL 100 101 100 102 100  CO2 24 26 26 26 25   GLUCOSE 139* 132* 151* 136* 122*  BUN 53* 48* 49* 44* 39*  CREATININE 3.38* 3.09* 3.31* 2.94* 2.64*  CALCIUM 7.8* 7.9* 8.1* 8.1* 8.2*  MG 1.8 2.1 2.2 2.1 2.0  PHOS 2.4* 2.3* 2.9 2.7 2.7    Liver Function Tests: Recent Labs  Lab 05/15/19 1832 05/15/19 2143 05/16/19 0145 05/16/19 0555 05/16/19 1045  ALBUMIN 2.5* 2.6* 2.7* 2.5* 2.6*   No results for input(s): LIPASE, AMYLASE in the last 168 hours. No results for input(s): AMMONIA in the last 168 hours.  CBC: Recent Labs  Lab 05/30/2019 0518 05/13/19 0558 05/13/19 1943  05/14/19 0429 05/15/19 0326 05/16/19 0555  WBC 15.7* 11.2*  --  11.7* 15.8* 12.8*  NEUTROABS 13.7*  --   --  9.5* 11.7* 9.7*  HGB 7.6* 6.9* 8.4* 7.9* 7.1* 6.9*  HCT 24.1* 22.5* 26.8* 24.8* 22.4* 22.8*  MCV 93.1 97.8  --  93.9 94.9 98.7  PLT 163 145*  --  132* 145* 155    Cardiac Enzymes: No results for input(s): CKTOTAL, CKMB, CKMBINDEX, TROPONINI in the last 168 hours.  BNP: Invalid input(s): POCBNP  CBG: Recent Labs  Lab 05/15/19 1934 05/15/19 2330 05/16/19 0444 05/16/19 0739 05/16/19 1140  GLUCAP 133* 109* 124* 121* 105*    Microbiology: Results for orders placed or performed during the hospital encounter of 04/08/2019  SARS Coronavirus 2 (CEPHEID - Performed in [facility] hospital lab), Hosp Order     Status: None   Collection Time: 04/11/2019  7:31 AM   Specimen: Nasopharyngeal Swab  Result Value Ref Range Status   SARS Coronavirus 2 NEGATIVE NEGATIVE Final    Comment: (NOTE) If result is NEGATIVE SARS-CoV-2 target nucleic acids are NOT DETECTED. The SARS-CoV-2 RNA is generally detectable in upper and lower  respiratory specimens during the acute phase of infection. The lowest  concentration of SARS-CoV-2 viral copies this assay can detect is 250  copies / mL. A negative result does not preclude SARS-CoV-2 infection  and should not  be used as the sole basis for treatment or other  patient management decisions.  A negative result may occur with  improper specimen collection / handling, submission of specimen other  than nasopharyngeal swab, presence of viral mutation(s) within the  areas targeted by this assay, and inadequate number of viral copies  (<250 copies / mL). A negative result must be combined with clinical  observations, patient history, and epidemiological information. If result is POSITIVE SARS-CoV-2 target nucleic acids are DETECTED. The SARS-CoV-2 RNA is generally detectable in upper and lower  respiratory specimens dur ing the acute phase of  infection.  Positive  results are indicative of active infection with SARS-CoV-2.  Clinical  correlation with patient history and other diagnostic information is  necessary to determine patient infection status.  Positive results do  not rule out bacterial infection or co-infection with other viruses. If result is PRESUMPTIVE POSTIVE SARS-CoV-2 nucleic acids MAY BE PRESENT.   A presumptive positive result was obtained on the submitted specimen  and confirmed on repeat testing.  While 2019 novel coronavirus  (SARS-CoV-2) nucleic acids may be present in the submitted sample  additional confirmatory testing may be necessary for epidemiological  and / or clinical management purposes  to differentiate between  SARS-CoV-2 and other Sarbecovirus currently known to infect humans.  If clinically indicated additional testing with an alternate test  methodology [phone]) is advised. The SARS-CoV-2 RNA is generally  detectable in upper and lower respiratory sp ecimens during the acute  phase of infection. The expected result is Negative. Fact Sheet for Patients:  StrictlyIdeas.no Fact Sheet for Healthcare Providers: BankingDealers.co.za This test is not yet approved or cleared by the [place] FDA and has been authorized for detection and/or diagnosis of SARS-CoV-2 by FDA under an Emergency Use Authorization (EUA).  This EUA will remain in effect (meaning this test can be used) for the duration of the COVID-19 declaration under Section 564(b)(1) of the Act, 21 U.S.C. section 360bbb-3(b)(1), unless the authorization is terminated or revoked sooner. Performed at [facility], [address] [place], [place] [address]   MRSA PCR Screening     Status: None   Collection Time: 04/16/2019  9:04 AM   Specimen: Nasal Mucosa; Nasopharyngeal  Result Value Ref Range Status   MRSA by PCR NEGATIVE NEGATIVE Final    Comment:        The GeneXpert  MRSA Assay (FDA approved for NASAL specimens only), is one component of a comprehensive MRSA colonization surveillance program. It is not intended to diagnose MRSA infection nor to guide or monitor treatment for MRSA infections. Performed at [facility], [address] [place], [place] [address]   Culture, respiratory (non-expectorated)     Status: None   Collection Time: 05/01/19 11:05 AM   Specimen: Tracheal Aspirate; Respiratory  Result Value Ref Range Status   Specimen Description   Final    TRACHEAL ASPIRATE Performed at [facility], [address] [place], [place] [address]    Special Requests   Final    NONE Performed at [facility], [address] [place], [place] [address]    Gram Stain   Final    MODERATE WBC PRESENT, PREDOMINANTLY PMN RARE YEAST Performed at [facility], [place] [address], [place], [place] [address]    Culture FEW CANDIDA ALBICANS FEW CANDIDA TROPICALIS   Final   Report Status 05/04/2019 FINAL  Final  Culture, respiratory     Status: None   Collection  Time: 05/09/19  6:30 PM   Specimen: INDUCED SPUTUM  Result Value Ref Range Status   Specimen Description   Final    INDUCED SPUTUM Performed at [facility], [address], [place], [place] [address]    Special Requests   Final    NONE Performed at [facility], [address] [place], [place] [address]    Gram Stain   Final    MODERATE WBC PRESENT, PREDOMINANTLY PMN FEW GRAM NEGATIVE RODS Performed at [facility], [place] [address] [place], [place] [address]    Culture MODERATE PSEUDOMONAS AERUGINOSA  Final   Report Status 05/14/2019 FINAL  Final   Organism ID, Bacteria PSEUDOMONAS AERUGINOSA  Final      Susceptibility   Pseudomonas aeruginosa - MIC*    CEFTAZIDIME 2 SENSITIVE Sensitive     CIPROFLOXACIN <=0.25 SENSITIVE Sensitive     GENTAMICIN <=1 SENSITIVE Sensitive     IMIPENEM 2 SENSITIVE Sensitive     PIP/TAZO  <=4 SENSITIVE Sensitive     CEFEPIME <=1 SENSITIVE Sensitive     * MODERATE PSEUDOMONAS AERUGINOSA    Coagulation Studies: No results for input(s): LABPROT, INR in the last 72 hours.  Urinalysis: No results for input(s): COLORURINE, LABSPEC, PHURINE, GLUCOSEU, HGBUR, BILIRUBINUR, KETONESUR, PROTEINUR, UROBILINOGEN, NITRITE, LEUKOCYTESUR in the last 72 hours.  Invalid input(s): APPERANCEUR    Imaging: Dg Chest Port 1 View  Result Date: 05/15/2019 CLINICAL DATA:  Acute respiratory failure EXAM: PORTABLE CHEST 1 VIEW COMPARISON:  05/13/2019 FINDINGS: Cardiac shadow is stable. Endotracheal tube, gastric catheter and hero graft are again noted and stable. Mild vascular congestion is seen with small bilateral pleural effusions and bibasilar atelectasis. The overall appearance is roughly stable from the prior exam. IMPRESSION: Stable vascular congestion with effusions and atelectasis. Tubes and lines as described. Electronically Signed   By: Inez Catalina M.D.   On: 05/15/2019 08:11     Medications:   . sodium chloride 200 mL (05/10/19 1200)  . cefTAZidime (FORTAZ)  IV 2 g (05/16/19 1056)  . fentaNYL infusion INTRAVENOUS 300 mcg/hr (05/16/19 1118)  . heparin Stopped (05/17/2019 0530)  . norepinephrine (LEVOPHED) Adult infusion 2 mcg/min (05/16/19 1159)  . pureflow 1,500 mL (05/15/19 2142)  . vasopressin (PITRESSIN) infusion - *FOR SHOCK* Stopped (05/10/19 1634)   . allopurinol  150 mg Per Tube Once per day on Tue Thu Sat  . amitriptyline  25 mg Per Tube QHS  . atorvastatin  40 mg Per Tube Daily  . budesonide (PULMICORT) nebulizer solution  0.5 mg Nebulization BID  . Chlorhexidine Gluconate Cloth  6 each Topical Daily  . citalopram  20 mg Per Tube Daily  . clopidogrel  75 mg Per Tube Daily  . clotrimazole  1 Applicatorful Vaginal QHS  . epoetin (EPOGEN/PROCRIT) injection  10,000 Units Intravenous Q T,Th,Sa-HD  . famotidine  20 mg Per Tube QHS  . feeding supplement (PRO-STAT SUGAR FREE  64)  30 mL Per Tube 5 X Daily  . feeding supplement (VITAL HIGH PROTEIN)  1,000 mL Per Tube Q24H  . free water  30 mL Per Tube Q4H  . heparin injection (subcutaneous)  5,000 Units Subcutaneous Q8H  . hydrocortisone sod succinate (SOLU-CORTEF) inj  50 mg Intravenous Q12H  . insulin aspart  0-15 Units Subcutaneous Q4H  . insulin aspart  4 Units Subcutaneous Q4H  . insulin glargine  5 Units Subcutaneous BID  . ipratropium-albuterol  3 mL Nebulization Q4H  . mouth rinse  15 mL Mouth  Rinse 10 times per day  . midodrine  10 mg Per Tube TID WC  . multivitamin  15 mL Per Tube Daily  . polyethylene glycol  17 g Oral BID  . senna-docusate  1 tablet Per Tube BID  . sodium chloride flush  10-40 mL Intracatheter Q12H   albuterol, fentaNYL, fentaNYL (SUBLIMAZE) injection, heparin, heparin, lidocaine-prilocaine, LORazepam, midazolam, nystatin, polyethylene glycol, polyvinyl alcohol, sodium chloride flush, vecuronium  Assessment/ Plan:  Ms. [name] is a 63 y.o. black female with end stage renal disease on hemodialysis, hypertension, CVA, obstructive sleep apnea, peripheral vascular disease, hypertension, gout, GERD, diabetes mellitus type II, COPD, coronary artery disease, congestive heart failure, who  was admitted to [facility] on 05/03/2019 for pneumonia. Acute respiratory failure requiring mechanical ventilation and vasopressors.   TTS [facility] left AVG   1. End stage renal disease  With generalized edema/volume overload Complication of dialysis device, clotted AVG.  Started on CRRT June 6 -Continue CRRT at this time.  We will maintain ultrafiltration target of 75 cc/h.  2. Anemia of chronic kidney disease:  Lab Results  Component Value Date   HGB 6.9 (L) 05/16/2019   -Hemoglobin down to 6.9.  Discussed with nursing.  Patient will receive 1 unit PRBC transfusion today.  3. Hypotension:  -Patient now back on norepinephrine to maintain map of 65.  4. Acute  respiratory failure with cardiopulmonary arrest. Extubated 6/2, re-intubated 6/5 Patient has been difficult to wean from the ventilator.  She is still requiring high amounts of FiO2.  Continue current ventilatory support.  Tracheostomy placement has also been been to be difficult in this situation.    LOS: 18 Munsoor Lateef 6/13/202012:34 PM

## 2019-05-16 NOTE — Consult Note (Signed)
PHARMACY CONSULT NOTE - FOLLOW UP  Pharmacy Consult for Electrolyte Monitoring and Replacement   Recent Labs: Potassium (mmol/L)  Date Value  05/16/2019 4.4  03/31/2015 4.2   Magnesium (mg/dL)  Date Value  05/16/2019 2.0  06/14/2014 1.8   Calcium (mg/dL)  Date Value  05/16/2019 8.2 (L)   Calcium, Total (mg/dL)  Date Value  03/31/2015 7.0 (LL)   Albumin (g/dL)  Date Value  05/16/2019 2.6 (L)  03/31/2015 2.3 (L)   Phosphorus (mg/dL)  Date Value  05/16/2019 2.7  03/31/2015 3.6   Sodium (mmol/L)  Date Value  05/16/2019 137  03/31/2015 129 (L)   Corrected Ca: 9.06 mg/dL  Assessment: 63 y.o. female admitted on 04/17/2019 with pneumonia. CRRT discontinued on 6/10 and patient was attempted to have dialysis on 6/11. However, hypotension prevented completion of the session and CRRT is being re-initiated  Goal of Therapy:  Potassium ~ 4, magnesium ~ 2  Plan:  6/13 0600 No electrolyte replacement warranted at this time.  Renal function panel is ordered every 4 hours: pharmacy will replace as needed  Oswald Hillock, PharmD, BCPS Clinical Pharmacist 05/16/2019 11:46 AM

## 2019-05-16 NOTE — Progress Notes (Signed)
RN called RT in to assess patients tube placement, ET tube had came out to 22 at the lip, ET tube advanced back in to 24 at the lip. Patients breath sounds were present bilaterally and diminished. Patients tidal volumes on ventilator stable with 481 ml of the set 480 being obtained.

## 2019-05-16 NOTE — Progress Notes (Signed)
[place] [facility] [place] NAME: [name]    MR#:  [id number]  DATE OF BIRTH:  1955-12-15     Patient remains critically ill.  Still on the ventilator.  CRRT today  REVIEW OF SYSTEMS:   Unable to obtain due to patient being intubated and sedated  DRUG ALLERGIES:   Allergies  Allergen Reactions  . Iodine Rash  . Enalapril Other (See Comments)    TONGUE SWELLS/GOUT  . Iodinated Diagnostic Agents Other (See Comments)    BREAK OUT IN WHELPS      VITALS:  Blood pressure (!) 107/40, pulse 81, temperature (!) 96.9 F (36.1 C), temperature source Axillary, resp. rate (!) 26, height 5' 5.98" (1.676 m), weight (!) 152.9 kg, SpO2 94 %.  PHYSICAL EXAMINATION:  GENERAL:  63 y.o.-morbidly obese and critically ill-appearing. EYES: Pupils equal, round, reactive to light  No scleral icterus.  HEENT: Head atraumatic, normocephalic.  Currently intubated, sedated. ETT in place. NECK:  Supple, no jugular venous distention. No thyroid enlargement, no tenderness.  LUNGS: +diffuse rhonchi and wheezing present CARDIOVASCULAR: RRR, S1, S2 normal. No murmurs, rubs, or gallops.  ABDOMEN: Soft, nontender, nondistended. Bowel sounds present.  EXTREMITIES: +pedal edema and bilateral hand edema, no cyanosis or clubbing.  NEUROLOGIC: Unable to do neuro exam because of intubation, sedation. PSYCHIATRIC: Intubated, sedated.   SKIN: No obvious rash, lesion, or ulcer.   LABORATORY PANEL:   CBC Recent Labs  Lab 05/16/19 0555  WBC 12.8*  HGB 6.9*  HCT 22.8*  PLT 155   ------------------------------------------------------------------------------------------------------------------  Chemistries  Recent Labs  Lab 05/16/19 1357  NA 136  K 5.4*  CL 102  CO2 23  GLUCOSE 124*  BUN 37*  CREATININE 2.59*  CALCIUM 7.9*  MG 1.9    ------------------------------------------------------------------------------------------------------------------  Cardiac Enzymes No results for input(s): TROPONINI in the last 168 hours. ------------------------------------------------------------------------------------------------------------------  RADIOLOGY:  Dg Chest Port 1 View  Result Date: 05/15/2019 CLINICAL DATA:  Acute respiratory failure EXAM: PORTABLE CHEST 1 VIEW COMPARISON:  05/13/2019 FINDINGS: Cardiac shadow is stable. Endotracheal tube, gastric catheter and hero graft are again noted and stable. Mild vascular congestion is seen with small bilateral pleural effusions and bibasilar atelectasis. The overall appearance is roughly stable from the prior exam. IMPRESSION: Stable vascular congestion with effusions and atelectasis. Tubes and lines as described. Electronically Signed   By: Inez Catalina M.D.   On: 05/15/2019 08:11    EKG:   Orders placed or performed during the hospital encounter of 04/06/2019  . EKG 12-Lead  . EKG 12-Lead  . EKG 12-Lead  . EKG 12-Lead    ASSESSMENT AND PLAN:   63 year old female patient with history of CVA, obstructive sleep apnea on CPAP, ESRD on hemodialysis Tuesday, Thursday, Saturday, congestive heart failure, COPD comes in because of worsening shortness of breath, she was called in the emergency room, had PEA, intubated, admitted to intensive care unit.  Acute hypoxic and hypercapneic respiratory failure- status post PEA arrest. Also with aspiration pneumonia -Extubated 6/2 and then had to be reintubated 6/5 -Vent management per CCM .  PRVC 80% -Plan was for trach, but patient unable to obtain trach due to severe hypoxia -Zosyn changed to ceftazidime -Sputum cultures growing pseudomonas  Shock- multi-factorial due to sepsis/hypovolemia/cardiogenic -On pressors.  Wean off for map 65 -Stress dose steroids -Continue midodrine tid -Abx as above  ESRD- on hemodialysis TTS. Temporary  HD cath placed 6/5. -Nephrology following - CRRT today.  Type 2 diabetes-  blood sugars well controlled -Lantus and SSI  Hyperlipidemia- stable -Continue Lipitor  History of stroke -Continue Plavix  COPD- stable, no signs of acute exacerbation. -Continue nebs  Anemia in chronic kidney disease- hemoglobin low but stable. -EPO per nephrology   All the records are reviewed and case discussed with Care Management/Social Workerr. Management plans discussed with the patient family members by intensivist CODE STATUS: full  TOTAL TIME TAKING CARE OF THIS PATIENT: 28 minutes.   Nicholes Mango M.D on 05/16/2019 at 3:15 PM  Between 7am to 6pm - Pager - [phone]  After 6pm go to [email or web address] - password [email or web address] [facility]  Office  [phone]  CC: Primary care physician; [place], [name], MD   Note: This dictation was prepared with Dragon dictation along with smaller phrase technology. Any transcriptional errors that result from this process are unintentional.

## 2019-05-16 NOTE — Progress Notes (Signed)
CRITICAL CARE NOTE  CC  follow up respiratory failure  SUBJECTIVE Patient remains critically ill Prognosis is guarded On CRRT +aspiration pneumonia Severe hypoxia  Vent Mode: PRVC FiO2 (%):  [65 %-75 %] 65 % Set Rate:  [15 bmp-26 bmp] 26 bmp Vt Set:  [450 mL-480 mL] 480 mL PEEP:  [15 cmH20] 15 cmH20    BP (!) 100/47   Pulse 78   Temp 97.6 F (36.4 C) (Oral)   Resp 16   Ht 5' 5.98" (1.676 m)   Wt (!) 152.9 kg   SpO2 93%   BMI 54.42 kg/m    I/O last 3 completed shifts: In: 1363.4 [I.V.:1263.4; IV Piggyback:100] Out: 6301 [Other:1028] No intake/output data recorded.  SpO2: 93 % O2 Flow Rate (L/min): 15 L/min FiO2 (%): 65 %   SIGNIFICANT EVENTS 5/26 ICU admission for resp failure and cardiac arrest 5/26 hypothermia protocol 6/3 successfully extubated 6/5 re-admitted to the ICU for aspiration pneumonia and progressive diastolic heart failure and renal failure 6/6 plan for [facility], ENT consulted, remains critically ill on vent 6/6 near cardiac arrest, fio2 100%, PEEP 15 6/7 severe resp failure, multiorgan failure, husband and daughter updated 6/8 unable to obtain trach due to severe hypoxia 6/10 severe hypoxia fio2 100% 6/11 severe hypoxia 75% 6/12 CRRT, pressors, severe hypoxia 75%  REVIEW OF SYSTEMS  PATIENT IS UNABLE TO PROVIDE COMPLETE REVIEW OF SYSTEMS DUE TO SEVERE CRITICAL ILLNESS   PHYSICAL EXAMINATION:  GENERAL:critically ill appearing, +resp distress HEAD: Normocephalic, atraumatic.  EYES: Pupils equal, round, reactive to light.  No scleral icterus.  MOUTH: Moist mucosal membrane. NECK: Supple. No thyromegaly. No nodules. No JVD.  PULMONARY: +rhonchi, +wheezing CARDIOVASCULAR: S1 and S2. Regular rate and rhythm. No murmurs, rubs, or gallops.  GASTROINTESTINAL: Soft, nontender, -distended. No masses. Positive bowel sounds. No hepatosplenomegaly.  MUSCULOSKELETAL: No swelling, clubbing, or edema.  NEUROLOGIC: obtunded,  GCS<8 SKIN:intact,warm,dry  MEDICATIONS: I have reviewed all medications and confirmed regimen as documented   CULTURE RESULTS   Recent Results (from the past 240 hour(s))  Culture, respiratory     Status: None   Collection Time: 05/09/19  6:30 PM   Specimen: INDUCED SPUTUM  Result Value Ref Range Status   Specimen Description   Final    INDUCED SPUTUM Performed at [facility], [address] [place], [place] [address]    Special Requests   Final    NONE Performed at [facility], [address] [place], [place] [address]    Gram Stain   Final    MODERATE WBC PRESENT, PREDOMINANTLY PMN FEW GRAM NEGATIVE RODS Performed at [facility], [place] [address] [place], [place] [address]    Culture MODERATE PSEUDOMONAS AERUGINOSA  Final   Report Status 05/26/2019 FINAL  Final   Organism ID, Bacteria PSEUDOMONAS AERUGINOSA  Final      Susceptibility   Pseudomonas aeruginosa - MIC*    CEFTAZIDIME 2 SENSITIVE Sensitive     CIPROFLOXACIN <=0.25 SENSITIVE Sensitive     GENTAMICIN <=1 SENSITIVE Sensitive     IMIPENEM 2 SENSITIVE Sensitive     PIP/TAZO <=4 SENSITIVE Sensitive     CEFEPIME <=1 SENSITIVE Sensitive     * MODERATE PSEUDOMONAS AERUGINOSA            Indwelling Urinary Catheter continued, requirement due to   Reason to continue Indwelling Urinary Catheter strict Intake/Output monitoring for hemodynamic instability   Central Line/ continued, requirement due to  Reason to continue [employer] of central  venous pressure or other hemodynamic parameters and poor IV access   Ventilator continued, requirement due to severe respiratory failure   Ventilator Sedation RASS 0 to -2      ASSESSMENT AND PLAN SYNOPSIS  63 yo morbidly obese AAF with severe end stage renal disease s/p cardiac arrest and hypothermia protocol with recurrent resp failure from acute diastolic heart failure and aspiration pneumonia  Due to her underlying end  stage diastolicheart failure and end stage renal disease with recurrent resp failure and significant morbid obesity, she well need Trach for survival. Sister and Husband notified of clinical status. Patient unable to obtain Trach due to severe hypoxia  Severe ACUTE Hypoxic and Hypercapnic Respiratory Failure -continue Full MV support -continue Bronchodilator Therapy -Wean Fio2 and PEEP as tolerated Unable to wean from vent  [place]- EF -oxygen as needed -Lasix as tolerated -follow up cardiac enzymes as indicated -follow up cardiology recs    Chronic KIDNEY INJURY/Renal Failure -follow chem 7 -follow UO -continue Foley Catheter-assess need -Avoid nephrotoxic agents -Recheck creatinine     NEUROLOGY - intubated and sedated - minimal sedation to achieve a RASS goal: -1    SHOCK-CARDIOGENIC -use vasopressors to keep MAP>65 if needed  CARDIAC ICU monitoring  ID -continue IV abx as prescibed -follow up cultures  GI GI PROPHYLAXIS as indicated  NUTRITIONAL STATUS DIET-->TF's as tolerated Constipation protocol as indicated  ENDO - will use ICU hypoglycemic\Hyperglycemia protocol if indicated   ELECTROLYTES -follow labs as needed -replace as needed -pharmacy consultation and following   DVT/GI PRX ordered TRANSFUSIONS AS NEEDED MONITOR FSBS ASSESS the need for LABS as needed   Critical Care Time devoted to patient care services described in this note is 32 minutes.   Overall, patient is critically ill, prognosis is guarded.  Patient with Multiorgan failure and at high risk for cardiac arrest and death.    Corrin Parker, M.D.  [facility]  Medical Director [place] Director [facility] Cardio-Pulmonary Department

## 2019-05-17 LAB — TYPE AND SCREEN
ABO/RH(D): A POS
Antibody Screen: NEGATIVE
Unit division: 0
Unit division: 0

## 2019-05-17 LAB — BPAM RBC
Blood Product Expiration Date: 202006172359
Blood Product Expiration Date: 202006272359
ISSUE DATE / TIME: 202006101517
ISSUE DATE / TIME: 202006131215
Unit Type and Rh: 600
Unit Type and Rh: 6200

## 2019-05-17 LAB — RENAL FUNCTION PANEL
Albumin: 2.7 g/dL — ABNORMAL LOW (ref 3.5–5.0)
Albumin: 2.8 g/dL — ABNORMAL LOW (ref 3.5–5.0)
Albumin: 2.7 g/dL — ABNORMAL LOW (ref 3.5–5.0)
Albumin: 2.6 g/dL — ABNORMAL LOW (ref 3.5–5.0)
Albumin: 2.6 g/dL — ABNORMAL LOW (ref 3.5–5.0)
Albumin: 2.5 g/dL — ABNORMAL LOW (ref 3.5–5.0)
Albumin: 2.5 g/dL — ABNORMAL LOW (ref 3.5–5.0)
Anion gap: 11 (ref 5–15)
Anion gap: 12 (ref 5–15)
Anion gap: 12 (ref 5–15)
Anion gap: 13 (ref 5–15)
Anion gap: 15 (ref 5–15)
Anion gap: 14 (ref 5–15)
Anion gap: 11 (ref 5–15)
BUN: 31 mg/dL — ABNORMAL HIGH (ref 8–23)
BUN: 31 mg/dL — ABNORMAL HIGH (ref 8–23)
BUN: 30 mg/dL — ABNORMAL HIGH (ref 8–23)
BUN: 32 mg/dL — ABNORMAL HIGH (ref 8–23)
BUN: 35 mg/dL — ABNORMAL HIGH (ref 8–23)
BUN: 38 mg/dL — ABNORMAL HIGH (ref 8–23)
BUN: 33 mg/dL — ABNORMAL HIGH (ref 8–23)
CO2: 24 mmol/L (ref 22–32)
CO2: 24 mmol/L (ref 22–32)
CO2: 23 mmol/L (ref 22–32)
CO2: 23 mmol/L (ref 22–32)
CO2: 21 mmol/L — ABNORMAL LOW (ref 22–32)
CO2: 22 mmol/L (ref 22–32)
CO2: 26 mmol/L (ref 22–32)
Calcium: 8.3 mg/dL — ABNORMAL LOW (ref 8.9–10.3)
Calcium: 8.5 mg/dL — ABNORMAL LOW (ref 8.9–10.3)
Calcium: 8.5 mg/dL — ABNORMAL LOW (ref 8.9–10.3)
Calcium: 8.3 mg/dL — ABNORMAL LOW (ref 8.9–10.3)
Calcium: 8.2 mg/dL — ABNORMAL LOW (ref 8.9–10.3)
Calcium: 8.2 mg/dL — ABNORMAL LOW (ref 8.9–10.3)
Calcium: 8.1 mg/dL — ABNORMAL LOW (ref 8.9–10.3)
Chloride: 103 mmol/L (ref 98–111)
Chloride: 101 mmol/L (ref 98–111)
Chloride: 102 mmol/L (ref 98–111)
Chloride: 101 mmol/L (ref 98–111)
Chloride: 102 mmol/L (ref 98–111)
Chloride: 102 mmol/L (ref 98–111)
Chloride: 101 mmol/L (ref 98–111)
Creatinine, Ser: 2.29 mg/dL — ABNORMAL HIGH (ref 0.44–1.00)
Creatinine, Ser: 2.33 mg/dL — ABNORMAL HIGH (ref 0.44–1.00)
Creatinine, Ser: 2.37 mg/dL — ABNORMAL HIGH (ref 0.44–1.00)
Creatinine, Ser: 2.6 mg/dL — ABNORMAL HIGH (ref 0.44–1.00)
Creatinine, Ser: 2.84 mg/dL — ABNORMAL HIGH (ref 0.44–1.00)
Creatinine, Ser: 3.11 mg/dL — ABNORMAL HIGH (ref 0.44–1.00)
Creatinine, Ser: 2.76 mg/dL — ABNORMAL HIGH (ref 0.44–1.00)
GFR calc Af Amer: 25 mL/min — ABNORMAL LOW (ref >60)
GFR calc Af Amer: 25 mL/min — ABNORMAL LOW (ref >60)
GFR calc Af Amer: 24 mL/min — ABNORMAL LOW (ref >60)
GFR calc Af Amer: 22 mL/min — ABNORMAL LOW (ref >60)
GFR calc Af Amer: 20 mL/min — ABNORMAL LOW (ref >60)
GFR calc Af Amer: 17 mL/min — ABNORMAL LOW (ref >60)
GFR calc Af Amer: 20 mL/min — ABNORMAL LOW (ref >60)
GFR calc non Af Amer: 22 mL/min — ABNORMAL LOW (ref >60)
GFR calc non Af Amer: 21 mL/min — ABNORMAL LOW (ref >60)
GFR calc non Af Amer: 21 mL/min — ABNORMAL LOW (ref >60)
GFR calc non Af Amer: 19 mL/min — ABNORMAL LOW (ref >60)
GFR calc non Af Amer: 17 mL/min — ABNORMAL LOW (ref >60)
GFR calc non Af Amer: 15 mL/min — ABNORMAL LOW (ref >60)
GFR calc non Af Amer: 17 mL/min — ABNORMAL LOW (ref >60)
Glucose, Bld: 115 mg/dL — ABNORMAL HIGH (ref 70–99)
Glucose, Bld: 120 mg/dL — ABNORMAL HIGH (ref 70–99)
Glucose, Bld: 119 mg/dL — ABNORMAL HIGH (ref 70–99)
Glucose, Bld: 111 mg/dL — ABNORMAL HIGH (ref 70–99)
Glucose, Bld: 106 mg/dL — ABNORMAL HIGH (ref 70–99)
Glucose, Bld: 111 mg/dL — ABNORMAL HIGH (ref 70–99)
Glucose, Bld: 124 mg/dL — ABNORMAL HIGH (ref 70–99)
Phosphorus: 2.7 mg/dL (ref 2.5–4.6)
Phosphorus: 2.9 mg/dL (ref 2.5–4.6)
Phosphorus: 3.1 mg/dL (ref 2.5–4.6)
Phosphorus: 3.5 mg/dL (ref 2.5–4.6)
Phosphorus: 3.3 mg/dL (ref 2.5–4.6)
Phosphorus: 3.4 mg/dL (ref 2.5–4.6)
Phosphorus: 2.9 mg/dL (ref 2.5–4.6)
Potassium: 4.3 mmol/L (ref 3.5–5.1)
Potassium: 4.6 mmol/L (ref 3.5–5.1)
Potassium: 4.6 mmol/L (ref 3.5–5.1)
Potassium: 4.4 mmol/L (ref 3.5–5.1)
Potassium: 4.4 mmol/L (ref 3.5–5.1)
Potassium: 4.5 mmol/L (ref 3.5–5.1)
Potassium: 4.3 mmol/L (ref 3.5–5.1)
Sodium: 138 mmol/L (ref 135–145)
Sodium: 137 mmol/L (ref 135–145)
Sodium: 137 mmol/L (ref 135–145)
Sodium: 137 mmol/L (ref 135–145)
Sodium: 138 mmol/L (ref 135–145)
Sodium: 138 mmol/L (ref 135–145)
Sodium: 138 mmol/L (ref 135–145)

## 2019-05-17 LAB — BLOOD GAS, ARTERIAL
Acid-base deficit: 0.4 mmol/L (ref 0.0–2.0)
Bicarbonate: 24.8 mmol/L (ref 20.0–28.0)
FIO2: 0.85
MECHVT: 480 mL
Mechanical Rate: 26
O2 Saturation: 99.5 %
PEEP: 15 cmH2O
Patient temperature: 37
pCO2 arterial: 42 mmHg (ref 32.0–48.0)
pH, Arterial: 7.38 (ref 7.350–7.450)
pO2, Arterial: 169 mmHg — ABNORMAL HIGH (ref 83.0–108.0)

## 2019-05-17 LAB — GLUCOSE, CAPILLARY
Glucose-Capillary: 104 mg/dL — ABNORMAL HIGH (ref 70–99)
Glucose-Capillary: 98 mg/dL (ref 70–99)
Glucose-Capillary: 87 mg/dL (ref 70–99)
Glucose-Capillary: 95 mg/dL (ref 70–99)
Glucose-Capillary: 64 mg/dL — ABNORMAL LOW (ref 70–99)
Glucose-Capillary: 116 mg/dL — ABNORMAL HIGH (ref 70–99)

## 2019-05-17 LAB — CBC WITH DIFFERENTIAL/PLATELET
Abs Immature Granulocytes: 0.2 10*3/uL — ABNORMAL HIGH (ref 0.00–0.07)
Basophils Absolute: 0 10*3/uL (ref 0.0–0.1)
Basophils Relative: 0 %
Eosinophils Absolute: 0 10*3/uL (ref 0.0–0.5)
Eosinophils Relative: 0 %
HCT: 27.2 % — ABNORMAL LOW (ref 36.0–46.0)
Hemoglobin: 8.3 g/dL — ABNORMAL LOW (ref 12.0–15.0)
Lymphocytes Relative: 7 %
Lymphs Abs: 1.1 10*3/uL (ref 0.7–4.0)
MCH: 30.1 pg (ref 26.0–34.0)
MCHC: 30.5 g/dL (ref 30.0–36.0)
MCV: 98.6 fL (ref 80.0–100.0)
Metamyelocytes Relative: 1 %
Monocytes Absolute: 2.2 10*3/uL — ABNORMAL HIGH (ref 0.1–1.0)
Monocytes Relative: 14 %
Neutro Abs: 12.1 10*3/uL — ABNORMAL HIGH (ref 1.7–7.7)
Neutrophils Relative %: 78 %
Platelets: 172 10*3/uL (ref 150–400)
RBC: 2.76 MIL/uL — ABNORMAL LOW (ref 3.87–5.11)
RDW: 22.4 % — ABNORMAL HIGH (ref 11.5–15.5)
WBC: 15.5 10*3/uL — ABNORMAL HIGH (ref 4.0–10.5)
nRBC: 2 /100 WBC — ABNORMAL HIGH
nRBC: 5.3 % — ABNORMAL HIGH (ref 0.0–0.2)

## 2019-05-17 LAB — MAGNESIUM
Magnesium: 2 mg/dL (ref 1.7–2.4)
Magnesium: 2.1 mg/dL (ref 1.7–2.4)
Magnesium: 2.1 mg/dL (ref 1.7–2.4)
Magnesium: 2 mg/dL (ref 1.7–2.4)
Magnesium: 1.9 mg/dL (ref 1.7–2.4)
Magnesium: 2 mg/dL (ref 1.7–2.4)

## 2019-05-17 LAB — APTT: aPTT: 33 seconds (ref 24–36)

## 2019-05-17 MED ORDER — NOREPINEPHRINE BITARTRATE 1 MG/ML IV SOLN
0.0000 ug/min | INTRAVENOUS | Status: DC
  Filled 2019-05-17: qty 16

## 2019-05-17 MED ORDER — ALTEPLASE 2 MG IJ SOLR
2.0000 mg | Freq: Once | INTRAMUSCULAR | Status: AC
  Administered 2019-05-17: 05:00:00 2 mg
  Filled 2019-05-17: qty 2

## 2019-05-17 MED ORDER — DEXTROSE 10 % IV SOLN
INTRAVENOUS | Status: DC
  Administered 2019-05-17: 22:00:00 via INTRAVENOUS

## 2019-05-17 MED ORDER — CHLORHEXIDINE GLUCONATE 0.12 % MT SOLN
15.0000 mL | Freq: Two times a day (BID) | OROMUCOSAL | Status: DC
  Administered 2019-05-17 – 2019-05-20 (×7): 15 mL via OROMUCOSAL
  Filled 2019-05-17 (×2): qty 15

## 2019-05-17 MED ORDER — DEXTROSE 50 % IV SOLN
1.0000 | Freq: Once | INTRAVENOUS | Status: AC
  Administered 2019-05-17: 50 mL via INTRAVENOUS
  Filled 2019-05-17: qty 50

## 2019-05-17 NOTE — Progress Notes (Signed)
[facility]  ROUNDING NOTE   Subjective:  Patient currently off of CRRT as CRRT cartridges are out of stock at the moment. Otherwise she has been tolerating CRRT well. Still on the ventilator.  Objective:  Vital signs in last 24 hours:  Temp:  [96.7 F (35.9 C)-98.6 F (37 C)] 98.6 F (37 C) (06/14 0800) Pulse Rate:  [78-90] 90 (06/14 1000) Resp:  [21-26] 21 (06/14 1000) BP: (62-144)/(23-59) 62/54 (06/14 1000) SpO2:  [90 %-99 %] 94 % (06/14 1101) FiO2 (%):  [40 %-85 %] 40 % (06/14 1101) Weight:  [148.3 kg] 148.3 kg (06/14 0500)  Weight change: -4.536 kg Filed Weights   05/15/19 0430 05/16/19 0500 05/17/19 0500  Weight: (!) 147.9 kg (!) 152.9 kg (!) 148.3 kg    Intake/Output: I/O last 3 completed shifts: In: 1996.1 [I.V.:1216.1; Blood:680; IV Piggyback:100] Out: 2703 [Other:1546]   Intake/Output this shift:  Total I/O In: 40 [I.V.:40] Out: -   Physical Exam: General: Critically ill appearing  HEENT ET tube, OG tube  Neck: supple  Lungs:  Bilateral rhonchi, vent assisted  Heart: Regular rate and rhythm  Abdomen:  Soft, nontender, obese  Extremities: + peripheral edema.  Neurologic: Sedated  Skin: warm  Access: thrombosed left arm AVG, rt fem temp cath replaced 6/8    Basic Metabolic Panel: Recent Labs  Lab 05/16/19 1910 05/16/19 2148 05/17/19 0300 05/17/19 0628 05/17/19 0951  NA 137 138 137 137 137  K 4.5 4.3 4.6 4.6 4.4  CL 102 103 101 102 101  CO2 24 24 24 23 23   GLUCOSE 139* 115* 120* 119* 111*  BUN 36* 31* 31* 30* 32*  CREATININE 2.60* 2.29* 2.33* 2.37* 2.60*  CALCIUM 8.3* 8.3* 8.5* 8.5* 8.3*  MG 2.0 2.0 2.1 2.1 2.0  PHOS 3.2 2.7 2.9 3.1 3.5    Liver Function Tests: Recent Labs  Lab 05/16/19 1910 05/16/19 2148 05/17/19 0300 05/17/19 0628 05/17/19 0951  ALBUMIN 2.7* 2.7* 2.8* 2.7* 2.6*   No results for input(s): LIPASE, AMYLASE in the last 168 hours. No results for input(s): AMMONIA in the last 168 hours.  CBC: Recent  Labs  Lab 05/08/2019 0518  05/14/19 0429 05/15/19 0326 05/16/19 0555 05/16/19 1910 05/17/19 0628  WBC 15.7*   < > 11.7* 15.8* 12.8* 17.0* 15.5*  NEUTROABS 13.7*  --  9.5* 11.7* 9.7*  --  12.1*  HGB 7.6*   < > 7.9* 7.1* 6.9* 8.4* 8.3*  HCT 24.1*   < > 24.8* 22.4* 22.8* 27.5* 27.2*  MCV 93.1   < > 93.9 94.9 98.7 100.0 98.6  PLT 163   < > 132* 145* 155 157 172   < > = values in this interval not displayed.    Cardiac Enzymes: No results for input(s): CKTOTAL, CKMB, CKMBINDEX, TROPONINI in the last 168 hours.  BNP: Invalid input(s): POCBNP  CBG: Recent Labs  Lab 05/16/19 1952 05/16/19 2336 05/17/19 0520 05/17/19 0739 05/17/19 1143  GLUCAP 137* 101* 104* 98 87    Microbiology: Results for orders placed or performed during the hospital encounter of 04/11/2019  SARS Coronavirus 2 (CEPHEID - Performed in [facility] hospital lab), Hosp Order     Status: None   Collection Time: 04/30/2019  7:31 AM   Specimen: Nasopharyngeal Swab  Result Value Ref Range Status   SARS Coronavirus 2 NEGATIVE NEGATIVE Final    Comment: (NOTE) If result is NEGATIVE SARS-CoV-2 target nucleic acids are NOT DETECTED. The SARS-CoV-2 RNA is generally detectable in upper and  lower  respiratory specimens during the acute phase of infection. The lowest  concentration of SARS-CoV-2 viral copies this assay can detect is 250  copies / mL. A negative result does not preclude SARS-CoV-2 infection  and should not be used as the sole basis for treatment or other  patient management decisions.  A negative result may occur with  improper specimen collection / handling, submission of specimen other  than nasopharyngeal swab, presence of viral mutation(s) within the  areas targeted by this assay, and inadequate number of viral copies  (<250 copies / mL). A negative result must be combined with clinical  observations, patient history, and epidemiological information. If result is POSITIVE SARS-CoV-2 target nucleic  acids are DETECTED. The SARS-CoV-2 RNA is generally detectable in upper and lower  respiratory specimens dur ing the acute phase of infection.  Positive  results are indicative of active infection with SARS-CoV-2.  Clinical  correlation with patient history and other diagnostic information is  necessary to determine patient infection status.  Positive results do  not rule out bacterial infection or co-infection with other viruses. If result is PRESUMPTIVE POSTIVE SARS-CoV-2 nucleic acids MAY BE PRESENT.   A presumptive positive result was obtained on the submitted specimen  and confirmed on repeat testing.  While 2019 novel coronavirus  (SARS-CoV-2) nucleic acids may be present in the submitted sample  additional confirmatory testing may be necessary for epidemiological  and / or clinical management purposes  to differentiate between  SARS-CoV-2 and other Sarbecovirus currently known to infect humans.  If clinically indicated additional testing with an alternate test  methodology [phone]) is advised. The SARS-CoV-2 RNA is generally  detectable in upper and lower respiratory sp ecimens during the acute  phase of infection. The expected result is Negative. Fact Sheet for Patients:  StrictlyIdeas.no Fact Sheet for Healthcare Providers: BankingDealers.co.za This test is not yet approved or cleared by the [place] FDA and has been authorized for detection and/or diagnosis of SARS-CoV-2 by FDA under an Emergency Use Authorization (EUA).  This EUA will remain in effect (meaning this test can be used) for the duration of the COVID-19 declaration under Section 564(b)(1) of the Act, 21 U.S.C. section 360bbb-3(b)(1), unless the authorization is terminated or revoked sooner. Performed at [facility], [address] [place], [place] [address]   MRSA PCR Screening     Status: None   Collection Time: 04/06/2019  9:04 AM   Specimen:  Nasal Mucosa; Nasopharyngeal  Result Value Ref Range Status   MRSA by PCR NEGATIVE NEGATIVE Final    Comment:        The GeneXpert MRSA Assay (FDA approved for NASAL specimens only), is one component of a comprehensive MRSA colonization surveillance program. It is not intended to diagnose MRSA infection nor to guide or monitor treatment for MRSA infections. Performed at [facility], [address] [place], [place] [address]   Culture, respiratory (non-expectorated)     Status: None   Collection Time: 05/01/19 11:05 AM   Specimen: Tracheal Aspirate; Respiratory  Result Value Ref Range Status   Specimen Description   Final    TRACHEAL ASPIRATE Performed at [facility], [address], [place], [place] [address]    Special Requests   Final    NONE Performed at [facility], [address] [place], [place] [address]    Gram Stain   Final    MODERATE WBC PRESENT, PREDOMINANTLY PMN RARE YEAST Performed at [facility],  [address] [place], [place] [address]    Culture FEW CANDIDA ALBICANS FEW CANDIDA TROPICALIS   Final   Report Status 05/04/2019 FINAL  Final  Culture, respiratory     Status: None   Collection Time: 05/09/19  6:30 PM   Specimen: INDUCED SPUTUM  Result Value Ref Range Status   Specimen Description   Final    INDUCED SPUTUM Performed at [facility], [address] [place], [place] [address]    Special Requests   Final    NONE Performed at [facility], [address], [place], [place] [address]    Gram Stain   Final    MODERATE WBC PRESENT, PREDOMINANTLY PMN FEW GRAM NEGATIVE RODS Performed at [facility], [place] [address], [place], [place] [address]    Culture MODERATE PSEUDOMONAS AERUGINOSA  Final   Report Status 05/23/2019 FINAL  Final   Organism ID, Bacteria PSEUDOMONAS AERUGINOSA  Final      Susceptibility   Pseudomonas aeruginosa - MIC*    CEFTAZIDIME 2 SENSITIVE Sensitive      CIPROFLOXACIN <=0.25 SENSITIVE Sensitive     GENTAMICIN <=1 SENSITIVE Sensitive     IMIPENEM 2 SENSITIVE Sensitive     PIP/TAZO <=4 SENSITIVE Sensitive     CEFEPIME <=1 SENSITIVE Sensitive     * MODERATE PSEUDOMONAS AERUGINOSA    Coagulation Studies: No results for input(s): LABPROT, INR in the last 72 hours.  Urinalysis: No results for input(s): COLORURINE, LABSPEC, PHURINE, GLUCOSEU, HGBUR, BILIRUBINUR, KETONESUR, PROTEINUR, UROBILINOGEN, NITRITE, LEUKOCYTESUR in the last 72 hours.  Invalid input(s): APPERANCEUR    Imaging: No results found.   Medications:   . sodium chloride 200 mL (05/10/19 1200)  . cefTAZidime (FORTAZ)  IV 2 g (05/17/19 1031)  . fentaNYL infusion INTRAVENOUS 225 mcg/hr (05/17/19 1025)  . heparin Stopped (05/08/2019 0530)  . norepinephrine (LEVOPHED) Adult infusion 3 mcg/min (05/17/19 0700)  . pureflow 2,500 mL/hr at 05/17/19 0141  . vasopressin (PITRESSIN) infusion - *FOR SHOCK* Stopped (05/10/19 1634)   . allopurinol  150 mg Per Tube Once per day on Tue Thu Sat  . amitriptyline  25 mg Per Tube QHS  . atorvastatin  40 mg Per Tube Daily  . budesonide (PULMICORT) nebulizer solution  0.5 mg Nebulization BID  . chlorhexidine  15 mL Mouth/Throat BID  . Chlorhexidine Gluconate Cloth  6 each Topical Daily  . citalopram  20 mg Per Tube Daily  . clopidogrel  75 mg Per Tube Daily  . clotrimazole  1 Applicatorful Vaginal QHS  . epoetin (EPOGEN/PROCRIT) injection  10,000 Units Intravenous Q T,Th,Sa-HD  . famotidine  20 mg Per Tube QHS  . feeding supplement (PRO-STAT SUGAR FREE 64)  30 mL Per Tube 5 X Daily  . feeding supplement (VITAL HIGH PROTEIN)  1,000 mL Per Tube Q24H  . free water  30 mL Per Tube Q4H  . heparin injection (subcutaneous)  5,000 Units Subcutaneous Q8H  . hydrocortisone sod succinate (SOLU-CORTEF) inj  50 mg Intravenous Q12H  . insulin aspart  0-15 Units Subcutaneous Q4H  . insulin aspart  4 Units Subcutaneous Q4H  . insulin glargine  5  Units Subcutaneous BID  . ipratropium-albuterol  3 mL Nebulization Q4H  . mouth rinse  15 mL Mouth Rinse 10 times per day  . midodrine  10 mg Per Tube TID WC  . multivitamin  15 mL Per Tube Daily  . polyethylene glycol  17 g Oral BID  . senna-docusate  1 tablet Per  Tube BID  . sodium chloride flush  10-40 mL Intracatheter Q12H   albuterol, fentaNYL, fentaNYL (SUBLIMAZE) injection, heparin, heparin, lidocaine-prilocaine, LORazepam, midazolam, nystatin, polyethylene glycol, polyvinyl alcohol, sodium chloride flush, vecuronium  Assessment/ Plan:  Ms. [name] is a 63 y.o. black female with end stage renal disease on hemodialysis, hypertension, CVA, obstructive sleep apnea, peripheral vascular disease, hypertension, gout, GERD, diabetes mellitus type II, COPD, coronary artery disease, congestive heart failure, who  was admitted to [facility] on 04/30/2019 for pneumonia. Acute respiratory failure requiring mechanical ventilation and vasopressors.   TTS [facility] left AVG   1. End stage renal disease  With generalized edema/volume overload Complication of dialysis device, clotted AVG.  Started on CRRT June 6 -Patient currently off of CRRT as CRRT cartridge is currently out of stock.  Anticipate that CRRT cartridge will be started later this evening.  Reinitiate CRRT once we have cartridge in place.  2. Anemia of chronic kidney disease:  Lab Results  Component Value Date   HGB 8.3 (L) 05/17/2019   -Hemoglobin up to 8.3 posttransfusion.  Continue to monitor.  3. Hypotension:  -Continue norepinephrine to maintain a map of 65 or better.  4. Acute respiratory failure with cardiopulmonary arrest. Extubated 6/2, re-intubated 6/5 FiO2 down to 50% today.  Patient to be maintained on ventilatory support.  Per ENT patient will require transfer to [facility] for placement of tracheostomy.    LOS: 19 Munsoor Lateef 6/14/202012:23 PM

## 2019-05-17 NOTE — Progress Notes (Signed)
CRITICAL CARE NOTE  CC  follow up respiratory failure  SUBJECTIVE Patient remains critically ill Prognosis is guarded Severe hypoxia +aspiration pneumonia Clotted off vasc cath   Vent Mode: PRVC FiO2 (%):  [65 %-100 %] 70 % Set Rate:  [26 bmp] 26 bmp Vt Set:  [480 mL] 480 mL PEEP:  [15 cmH20] 15 cmH20 Plateau Pressure:  [27 cmH20-29 cmH20] 27 cmH20  BP (!) 134/44   Pulse 82   Temp (!) 96.7 F (35.9 C) (Oral)   Resp (!) 26   Ht 5' 5.98" (1.676 m)   Wt (!) 148.3 kg   SpO2 97%   BMI 52.80 kg/m    I/O last 3 completed shifts: In: 1929.9 [I.V.:1149.9; Blood:680; IV Piggyback:100] Out: 6712 [Other:1546] No intake/output data recorded.  SpO2: 97 % O2 Flow Rate (L/min): 15 L/min FiO2 (%): 70 %   SIGNIFICANT EVENTS 5/26 ICU admission for resp failure and cardiac arrest 5/26 hypothermia protocol 6/3 successfully extubated 6/5 re-admitted to the ICU for aspiration pneumonia and progressive diastolic heart failure and renal failure 6/6 plan for [facility], ENT consulted, remains critically ill on vent 6/6 near cardiac arrest, fio2 100%, PEEP 15 6/7 severe resp failure, multiorgan failure, husband and daughter updated 6/8 unable to obtain trach due to severe hypoxia 6/10 severe hypoxia fio2 100% 6/11 severe hypoxia 75% 6/12 CRRT, pressors, severe hypoxia 75% 6/13 severe hypoxia, CRRT stopped  REVIEW OF SYSTEMS  PATIENT IS UNABLE TO PROVIDE COMPLETE REVIEW OF SYSTEMS DUE TO SEVERE CRITICAL ILLNESS   PHYSICAL EXAMINATION:  GENERAL:critically ill appearing, +resp distress, morbidly obese HEAD: Normocephalic, atraumatic.  EYES: Pupils equal, round, reactive to light.  No scleral icterus.  MOUTH: Moist mucosal membrane. NECK: Supple. No thyromegaly. No nodules. No JVD.  PULMONARY: +rhonchi, +wheezing CARDIOVASCULAR: S1 and S2. Regular rate and rhythm. No murmurs, rubs, or gallops.  GASTROINTESTINAL: Soft, nontender, -distended. No masses. Positive bowel sounds. No  hepatosplenomegaly.  MUSCULOSKELETAL: No swelling, clubbing, or edema.  NEUROLOGIC: obtunded, GCS<8 SKIN:intact,warm,dry  MEDICATIONS: I have reviewed all medications and confirmed regimen as documented   CULTURE RESULTS   Recent Results (from the past 240 hour(s))  Culture, respiratory     Status: None   Collection Time: 05/09/19  6:30 PM   Specimen: INDUCED SPUTUM  Result Value Ref Range Status   Specimen Description   Final    INDUCED SPUTUM Performed at [facility], [address] [place], [place] [address]    Special Requests   Final    NONE Performed at [facility], [address] [place], [place] [address]    Gram Stain   Final    MODERATE WBC PRESENT, PREDOMINANTLY PMN FEW GRAM NEGATIVE RODS Performed at [facility], [place] [address] [place], [place] [address]    Culture MODERATE PSEUDOMONAS AERUGINOSA  Final   Report Status 05/25/2019 FINAL  Final   Organism ID, Bacteria PSEUDOMONAS AERUGINOSA  Final      Susceptibility   Pseudomonas aeruginosa - MIC*    CEFTAZIDIME 2 SENSITIVE Sensitive     CIPROFLOXACIN <=0.25 SENSITIVE Sensitive     GENTAMICIN <=1 SENSITIVE Sensitive     IMIPENEM 2 SENSITIVE Sensitive     PIP/TAZO <=4 SENSITIVE Sensitive     CEFEPIME <=1 SENSITIVE Sensitive     * MODERATE PSEUDOMONAS AERUGINOSA            Indwelling Urinary Catheter continued, requirement due to   Reason to continue Indwelling Urinary Catheter strict Intake/Output monitoring for hemodynamic instability  Central Line/ continued, requirement due to  Reason to continue [employer] of central venous pressure or other hemodynamic parameters and poor IV access   Ventilator continued, requirement due to severe respiratory failure   Ventilator Sedation RASS 0 to -2      ASSESSMENT AND PLAN SYNOPSIS 63 yo morbidly obese AAF with severe end stage renal disease s/p cardiac arrest and hypothermia protocol with recurrent resp  failure from acute diastolic heart failure and aspiration/pseudomonas pneumonia  Due to her underlying end stage diastolicheart failure and end stage renal disease with recurrent resp failure and significant morbid obesity, she well need Trach for survival. Sister and Husband notified of clinical status. Patient unable to obtain Trach due to severe hypoxia  Severe ACUTE Hypoxic and Hypercapnic Respiratory Failure -continue Full MV support -continue Bronchodilator Therapy -Wean Fio2 and PEEP as tolerated -severe Hypoxia  ACUTE DIASTOLIC CARDIAC FAILURE-  vent support    CHRONIC KIDNEY INJURY/Renal Failure -follow chem 7 -follow UO -continue Foley Catheter-assess need -Avoid nephrotoxic agents -Recheck creatinine     NEUROLOGY - intubated and sedated - minimal sedation to achieve a RASS goal: -1 Wake up assessment pending   SHOCK-CARDIOGENIC -use vasopressors to keep MAP>65 as needed -follow ABG and LA  CARDIAC ICU monitoring  ID -continue IV abx as prescibed -follow up cultures  GI GI PROPHYLAXIS as indicated  NUTRITIONAL STATUS DIET-->on hold Constipation protocol as indicated  ENDO - will use ICU hypoglycemic\Hyperglycemia protocol if indicated   ELECTROLYTES -follow labs as needed -replace as needed -pharmacy consultation and following   DVT/GI PRX ordered TRANSFUSIONS AS NEEDED MONITOR FSBS ASSESS the need for LABS as needed   Critical Care Time devoted to patient care services described in this note is 34 minutes.   Overall, patient is critically ill, prognosis is guarded.  Patient with Multiorgan failure and at high risk for cardiac arrest and death.   Recommend comfort care measures with progressive diastolic heart failure and multiorgan failure  Corrin Parker, M.D.  [facility]  Medical Director [place] Director [facility] Cardio-Pulmonary Department

## 2019-05-17 NOTE — Progress Notes (Signed)
PHARMACY CONSULT NOTE - FOLLOW UP  Pharmacy Consult for Electrolyte Monitoring and Replacement   Recent Labs: Potassium (mmol/L)  Date Value  05/17/2019 4.6  03/31/2015 4.2   Magnesium (mg/dL)  Date Value  05/17/2019 2.1  06/14/2014 1.8   Calcium (mg/dL)  Date Value  05/17/2019 8.5 (L)   Calcium, Total (mg/dL)  Date Value  03/31/2015 7.0 (LL)   Albumin (g/dL)  Date Value  05/17/2019 2.8 (L)  03/31/2015 2.3 (L)   Phosphorus (mg/dL)  Date Value  05/17/2019 2.9  03/31/2015 3.6   Sodium (mmol/L)  Date Value  05/17/2019 137  03/31/2015 129 (L)     Assessment:63 y.o.femaleadmittedon 5/26/2020with pneumonia. CRRT discontinued on 6/10 and patient was attemptedto have dialysis on 6/11.However, hypotension prevented completion of the session and CRRT is being re-initiated  Goal of Therapy:  Potassium ~ 4, magnesium ~ 2  Plan:  6/14 0300 No electrolyte replacement warranted at this time.   Renal function panel is ordered every 4 hours: pharmacy will replace as needed   Paulina Fusi, PharmD, BCPS 05/17/2019 4:06 AM

## 2019-05-17 NOTE — Progress Notes (Signed)
PHARMACY CONSULT NOTE - FOLLOW UP  Pharmacy Consult for Electrolyte Monitoring and Replacement   Recent Labs: Potassium (mmol/L)  Date Value  05/17/2019 4.6  03/31/2015 4.2   Magnesium (mg/dL)  Date Value  05/17/2019 2.1  06/14/2014 1.8   Calcium (mg/dL)  Date Value  05/17/2019 8.5 (L)   Calcium, Total (mg/dL)  Date Value  03/31/2015 7.0 (LL)   Albumin (g/dL)  Date Value  05/17/2019 2.7 (L)  03/31/2015 2.3 (L)   Phosphorus (mg/dL)  Date Value  05/17/2019 3.1  03/31/2015 3.6   Sodium (mmol/L)  Date Value  05/17/2019 137  03/31/2015 129 (L)     Assessment:63 y.o.femaleadmittedon 5/26/2020with pneumonia. CRRT discontinued on 6/10 and patient was attemptedto have dialysis on 6/11.However, hypotension prevented completion of the session and CRRT is being re-initiated  Goal of Therapy:  Potassium ~ 4, magnesium ~ 2  Plan:  No electrolyte replacement warranted at this time.   Renal function panel is ordered every 4 hours: pharmacy will replace as needed   Eleonore Chiquito, PharmD, BCPS 05/17/2019 7:56 AM

## 2019-05-17 NOTE — Progress Notes (Signed)
CRRT has clotted off at venous access. Unable to perform flush back.

## 2019-05-17 NOTE — Progress Notes (Signed)
[name] at [place] NAME: [name]    MR#:  [id number]  DATE OF BIRTH:  09/04/1956     Patient remains critically ill.  Still on the ventilator.  CRRT done yesterday 50% FiO2 REVIEW OF SYSTEMS:   Unable to obtain due to patient being intubated and sedated  DRUG ALLERGIES:   Allergies  Allergen Reactions  . Iodine Rash  . Enalapril Other (See Comments)    TONGUE SWELLS/GOUT  . Iodinated Diagnostic Agents Other (See Comments)    BREAK OUT IN WHELPS      VITALS:  Blood pressure (!) 113/52, pulse 88, temperature 98 F (36.7 C), temperature source Axillary, resp. rate (!) 23, height 5' 5.98" (1.676 m), weight (!) 148.3 kg, SpO2 93 %.  PHYSICAL EXAMINATION:  GENERAL:  63 y.o.-morbidly obese and critically ill-appearing. EYES: Pupils equal, round, reactive to light  No scleral icterus.  HEENT: Head atraumatic, normocephalic.  Currently intubated, sedated. ETT in place. NECK:  Supple, no jugular venous distention. No thyroid enlargement, no tenderness.  LUNGS: +diffuse rhonchi and wheezing present CARDIOVASCULAR: RRR, S1, S2 normal. No murmurs, rubs, or gallops.  ABDOMEN: Soft, nontender, nondistended. Bowel sounds present.  EXTREMITIES: +pedal edema and bilateral hand edema, no cyanosis or clubbing.  NEUROLOGIC: Unable to do neuro exam because of intubation, sedation. PSYCHIATRIC: Intubated, sedated.   SKIN: No obvious rash, lesion, or ulcer.   LABORATORY PANEL:   CBC Recent Labs  Lab 05/17/19 0628  WBC 15.5*  HGB 8.3*  HCT 27.2*  PLT 172   ------------------------------------------------------------------------------------------------------------------  Chemistries  Recent Labs  Lab 05/17/19 0951 05/17/19 1333  NA 137 138  K 4.4 4.4  CL 101 102  CO2 23 21*  GLUCOSE 111* 106*  BUN 32* 35*  CREATININE 2.60* 2.84*  CALCIUM 8.3* 8.2*  MG 2.0  --     ------------------------------------------------------------------------------------------------------------------  Cardiac Enzymes No results for input(s): TROPONINI in the last 168 hours. ------------------------------------------------------------------------------------------------------------------  RADIOLOGY:  No results found.  EKG:   Orders placed or performed during the hospital encounter of 04/06/2019  . EKG 12-Lead  . EKG 12-Lead  . EKG 12-Lead  . EKG 12-Lead    ASSESSMENT AND PLAN:   63 year old female patient with history of CVA, obstructive sleep apnea on CPAP, ESRD on hemodialysis Tuesday, Thursday, Saturday, congestive heart failure, COPD comes in because of worsening shortness of breath, she was called in the emergency room, had PEA, intubated, admitted to intensive care unit.  Acute hypoxic and hypercapneic respiratory failure- status post PEA arrest. Also with aspiration pneumonia -Extubated 6/2 and then had to be reintubated 6/5 -Vent management per CCM .  PRVC 50% discussed with intensivist -Plan was for trach, but patient unable to obtain trach due to severe hypoxia -Zosyn changed to ceftazidime -Sputum cultures growing pseudomonas  Shock- multi-factorial due to sepsis/hypovolemia/cardiogenic -On pressors.  Wean off for map 65 -Stress dose steroids -Continue midodrine tid -Abx as above  ESRD- on hemodialysis TTS. Temporary HD cath placed 6/5. -Nephrology following - CRRT done 05/16/2019.  Ran out of cartridges cannot get an CRRT today  Type 2 diabetes- blood sugars well controlled -Lantus and SSI  Hyperlipidemia- stable -Continue Lipitor  History of stroke -Continue Plavix  COPD- stable, no signs of acute exacerbation. -Continue nebs  Anemia in chronic kidney disease- hemoglobin low but stable. -EPO per nephrology   All the records are reviewed and case discussed with Care Management/Social Workerr. Management plans discussed with the  patients  daughter CODE STATUS: full  TOTAL TIME TAKING CARE OF THIS PATIENT: 28 minutes.   Nicholes Mango M.D on 05/17/2019 at 2:25 PM  Between 7am to 6pm - Pager - [phone]  After 6pm go to [email or web address] - password [email or web address] [facility]  Office  [phone]  CC: Primary care physician; Smithson, [name], MD   Note: This dictation was prepared with Dragon dictation along with smaller phrase technology. Any transcriptional errors that result from this process are unintentional.

## 2019-05-17 NOTE — Progress Notes (Signed)
Pt has remained alert to voice, + FC. Pt remains on Fentanyl gtt for comfort. Pt has a very sensitive gag reflex with the ETT and oral care as well. Pt has transitioned to 30% FiO2 with a PEEP of 5 today. NDN. SpO2 90%. Lungs remain with bilat rhonchi. Pt continues to have random drops in sats to 80%- pt rebounds after a temporary increase in FiO2. Pt has remained in NSR on cardiac monitor. Pt remains on low dose levo for BP support, HR WNL. CRRT has been held majority of shift d/t no filter access.

## 2019-05-17 NOTE — Consult Note (Signed)
Pharmacy Antibiotic Note  [name] is a 63 y.o. female admitted on 04/24/2019 with pneumonia. Leukocytosis improved initially but is now trending up. Right femoral dialysis catheter replaced 6/8. CRRT discontinued on 6/10 and patient was attempted to have dialysis on 6/11. However, hypotension prevented completion of the session and CRRT is being re-initiated. Per MD note CRRT stopped 6/13.   Plan: Continue ceftazadime to 2g IV Q12hr per CrCl.   Height: 5' 5.98" (167.6 cm) Weight: (!) 327 lb (148.3 kg) IBW/kg (Calculated) : 59.26  Temp (24hrs), Avg:96.9 F (36.1 C), Min:96.7 F (35.9 C), Max:97.3 F (36.3 C)  Recent Labs  Lab 05/14/19 0429 05/15/19 0326  05/16/19 0555  05/16/19 1357 05/16/19 1910 05/16/19 2148 05/17/19 0300 05/17/19 0628  WBC 11.7* 15.8*  --  12.8*  --   --  17.0*  --   --  PENDING  CREATININE 4.23*  --    < > 2.94*   < > 2.59* 2.60* 2.29* 2.33* 2.37*   < > = values in this interval not displayed.    Estimated Creatinine Clearance: 35.9 mL/min (A) (by C-G formula based on SCr of 2.37 mg/dL (H)).    Antimicrobials this admission:  Ceftazidime 6/11 Zosyn 6/ 6>> 6/11 Vancomycin 5/26 >> 5/27 Cefepime 5/26 >> 5/29 Azithromycin 5/29 >> 6/2 Doxycyline 5/29 >> 6/2 Ceftazidime  6/11 >> 6/13  Microbiology results:  5/26 MRSA PCR: neg 5/29 RCx few C albicans, C tropicalis 6/6 Sputum Culture: pseudomonas aeruginosa    Thank you for allowing pharmacy to be a part of this patient's care.  Oswald Hillock, PharmD, BCPS 05/17/2019 7:54 AM

## 2019-05-18 ENCOUNTER — Encounter: Payer: Self-pay | Admitting: Primary Care

## 2019-05-18 ENCOUNTER — Inpatient Hospital Stay: Payer: Medicare Other

## 2019-05-18 DIAGNOSIS — Z7189 Other specified counseling: Secondary | ICD-10-CM

## 2019-05-18 DIAGNOSIS — L8990 Pressure ulcer of unspecified site, unspecified stage: Secondary | ICD-10-CM | POA: Insufficient documentation

## 2019-05-18 DIAGNOSIS — Z515 Encounter for palliative care: Secondary | ICD-10-CM

## 2019-05-18 LAB — CBC
HCT: 23.8 % — ABNORMAL LOW (ref 36.0–46.0)
Hemoglobin: 7.3 g/dL — ABNORMAL LOW (ref 12.0–15.0)
MCH: 30.4 pg (ref 26.0–34.0)
MCHC: 30.7 g/dL (ref 30.0–36.0)
MCV: 99.2 fL (ref 80.0–100.0)
Platelets: 156 10*3/uL (ref 150–400)
RBC: 2.4 MIL/uL — ABNORMAL LOW (ref 3.87–5.11)
RDW: 22.3 % — ABNORMAL HIGH (ref 11.5–15.5)
WBC: 10.7 10*3/uL — ABNORMAL HIGH (ref 4.0–10.5)
nRBC: 4 % — ABNORMAL HIGH (ref 0.0–0.2)

## 2019-05-18 LAB — RENAL FUNCTION PANEL
Albumin: 2.5 g/dL — ABNORMAL LOW (ref 3.5–5.0)
Albumin: 2.4 g/dL — ABNORMAL LOW (ref 3.5–5.0)
Anion gap: 10 (ref 5–15)
Anion gap: 11 (ref 5–15)
BUN: 29 mg/dL — ABNORMAL HIGH (ref 8–23)
BUN: 26 mg/dL — ABNORMAL HIGH (ref 8–23)
CO2: 25 mmol/L (ref 22–32)
CO2: 24 mmol/L (ref 22–32)
Calcium: 8 mg/dL — ABNORMAL LOW (ref 8.9–10.3)
Calcium: 8 mg/dL — ABNORMAL LOW (ref 8.9–10.3)
Chloride: 103 mmol/L (ref 98–111)
Chloride: 103 mmol/L (ref 98–111)
Creatinine, Ser: 2.5 mg/dL — ABNORMAL HIGH (ref 0.44–1.00)
Creatinine, Ser: 2.4 mg/dL — ABNORMAL HIGH (ref 0.44–1.00)
GFR calc Af Amer: 23 mL/min — ABNORMAL LOW (ref >60)
GFR calc Af Amer: 24 mL/min — ABNORMAL LOW (ref >60)
GFR calc non Af Amer: 20 mL/min — ABNORMAL LOW (ref >60)
GFR calc non Af Amer: 21 mL/min — ABNORMAL LOW (ref >60)
Glucose, Bld: 122 mg/dL — ABNORMAL HIGH (ref 70–99)
Glucose, Bld: 128 mg/dL — ABNORMAL HIGH (ref 70–99)
Phosphorus: 2.8 mg/dL (ref 2.5–4.6)
Phosphorus: 2.8 mg/dL (ref 2.5–4.6)
Potassium: 4.4 mmol/L (ref 3.5–5.1)
Potassium: 4.5 mmol/L (ref 3.5–5.1)
Sodium: 138 mmol/L (ref 135–145)
Sodium: 138 mmol/L (ref 135–145)

## 2019-05-18 LAB — GLUCOSE, CAPILLARY
Glucose-Capillary: 112 mg/dL — ABNORMAL HIGH (ref 70–99)
Glucose-Capillary: 110 mg/dL — ABNORMAL HIGH (ref 70–99)
Glucose-Capillary: 102 mg/dL — ABNORMAL HIGH (ref 70–99)
Glucose-Capillary: 81 mg/dL (ref 70–99)
Glucose-Capillary: 82 mg/dL (ref 70–99)
Glucose-Capillary: 120 mg/dL — ABNORMAL HIGH (ref 70–99)
Glucose-Capillary: 98 mg/dL (ref 70–99)

## 2019-05-18 LAB — MAGNESIUM
Magnesium: 2.1 mg/dL (ref 1.7–2.4)
Magnesium: 1.8 mg/dL (ref 1.7–2.4)
Magnesium: 1.9 mg/dL (ref 1.7–2.4)

## 2019-05-18 MED ORDER — HEPARIN 1000 UNIT/ML FOR PERITONEAL DIALYSIS
1000.0000 [IU] | Freq: Once | INTRAMUSCULAR | Status: AC
  Administered 2019-05-18: 1000 [IU] via INTRAVENOUS_CENTRAL
  Filled 2019-05-18: qty 1

## 2019-05-18 MED ORDER — HEPARIN 1000 UNIT/ML FOR PERITONEAL DIALYSIS
1000.0000 [IU] | Freq: Once | INTRAMUSCULAR | Status: AC
  Administered 2019-05-18: 1000 [IU] via INTRAVENOUS_CENTRAL
  Filled 2019-05-18 (×2): qty 1

## 2019-05-18 NOTE — Progress Notes (Signed)
Assisted tele visit to patient with family member.  Moore, Susan R, RN  

## 2019-05-18 NOTE — Progress Notes (Signed)
PHARMACY CONSULT NOTE - FOLLOW UP  Pharmacy Consult for Electrolyte Monitoring and Replacement   Recent Labs: Potassium (mmol/L)  Date Value  05/18/2019 4.5  03/31/2015 4.2   Magnesium (mg/dL)  Date Value  05/18/2019 1.8  06/14/2014 1.8   Calcium (mg/dL)  Date Value  05/18/2019 8.0 (L)   Calcium, Total (mg/dL)  Date Value  03/31/2015 7.0 (LL)   Albumin (g/dL)  Date Value  05/18/2019 2.4 (L)  03/31/2015 2.3 (L)   Phosphorus (mg/dL)  Date Value  05/18/2019 2.8  03/31/2015 3.6   Sodium (mmol/L)  Date Value  05/18/2019 138  03/31/2015 129 (L)     Assessment:63 y.o.femaleadmittedon 5/26/2020with pneumonia. CRRT discontinued on 6/10 and patient was attemptedto have dialysis on 6/11.However, hypotension prevented completion of the session and CRRT is being re-initiated  Goal of Therapy:  Potassium ~ 4, magnesium ~ 2  Plan:  No electrolyte replacement warranted at this time.   Renal function panel is ordered every 4 hours: pharmacy will replace as needed   Paulina Fusi, PharmD, BCPS 05/18/2019 6:20 AM

## 2019-05-18 NOTE — Progress Notes (Signed)
Patient has not received midodrine for multiple days due to no OG or NG access.  Blood pressures have been normotensive and this shift have started to elevate.  Information discussed with Dr. Mackey Birchwood and order obtained to discontinue midodrine.

## 2019-05-18 NOTE — Consult Note (Signed)
Consultation Note Date: 05/18/2019   Patient Name: [name]  DOB: 02-04-1956  MRN: [id number]  Age / Sex: 63 y.o., female  PCP: Smithson, Carolyn Blazer, MD Referring Physician: Nicholes Mango, MD  Reason for Consultation: Establishing goals of care  HPI/Patient Profile: 64 y.o. female  with past medical history of end-stage kidney disease dialysis patient, morbid obesity with a BMI of 53, OSA, COPD with oxygen at night, heart failure, diabetes, PVD, left kidney cancer, CAD, diabetes admitted on 05/01/2019 with recurrent respiratory failure from acute diastolic heart failure and aspiration pneumonia.   Clinical Assessment and Goals of Care: [name] is lying quietly in bed, intubated/ventilated, sedated.  She does not respond in any meaningful way at this point.  There is no family at bedside at this time due to visitor restrictions.  I have reviewed medical records including EPIC notes, labs and imaging, received report from bedside nursing staff, assessed the patient and then called husband Tommy to discuss diagnosis prognosis, [name], [name], disposition and options.  I introduced Palliative Medicine as specialized medical care for people living with serious illness. It focuses on providing relief from the symptoms and stress of a serious illness. The goal is to improve quality of life for both the patient and the family.  Time he shares that he and [name] will have been married 30 years next month.  He shares that he is on a lot of somatic locations for gout, OA, and "heart problems".  He is states that what is going on with [name] is adding a lot of stress.  We discussed a brief life review of the patient.  [name] has 1 child, [name].  She is [name]'s stepdaughter, but he calls her his daughter. [name] has no siblings, but has a step brother/ step sis who are close.  [name] shares that they  know how sick she is.  [name] has been taking dialysis since 2002.  [name] seems unsure of the timeline, but then tells me that she had kidney cancer and had half of a kidney removed in 2004. He tells me that [name] was told that if she could lose weight she may qualify for kidney.  We talked about her current physical problems, debility and [name] agrees that she would not qualify for any organ transplant at this time.  As far as functional and nutritional status, [name] would have PT at home at times.  She has difficulty with mobility related to her obesity.  She used a walker, but could provide her own bathing and dressing.  Husband helped her up with putting on shoes.  [name] helps with housework at times.  He shares that daughter [name] moved in with the grandchildren about 6 months ago.  We discussed her current illness and what it means in the larger context of her on-going co-morbidities.  Natural disease trajectory and expectations at Carolyn were discussed.  We talked about 18 years on dialysis, the tolls on the body.  Although time he tells me that Soleia wanted him  to keep her alive, but also shares that she would not want to be trached again.  I attempted to elicit values and goals of care important to the patient.  We talked about transfer to [facility] for trach.  [name] shares that [name] had a trach in the past at [facility] in a "situation like this" in 2004.  We talked about [name]'s understanding of what happened to her, her choices about the future.  [name] shares that [name] told him to "not do it again".  I asked him if that something that he could agree with, to not do a trach again, and he tells me that he can agree to not trach.  He tells me that daughter [name] is in the hospital with a flare of her pancreas, she should be coming home tomorrow, he wants to discuss this with daughter [name] first.  The difference between aggressive medical intervention and comfort care was  considered in light of the patient's goals of care.   Advanced directives, concepts specific to code status were considered and discussed. At this point, [name] states that he would not want defibrillation or chest compressions.  I reassured him that we are not going to take anything away from [place] at this point.  I share that PMT will call tomorrow afternoon to update family, answer questions.  I reassure [name] that if they choose no trach, when the time comes we would take out the breathing tube, make sure she is comfortable, continue to care for her.  I shared that family could come and be with her and she would likely pass in the hospital.  Questions and concerns were addressed.   The family was encouraged to call with questions or concerns.    HCPOA    NEXT OF KIN -  Spouse [name], married 30 years.   Daughter Rolan Lipa is now hospitalized, [name] is step dad.    SUMMARY OF RECOMMENDATIONS   No chest compressions, no defibrillation Husband Tommy tells me that Zayli would not want a trach again Time he requesting time to discuss with daughter [name]. PMT planning follow-up 6/16 in the afternoon  Code Status/Advance Care Planning:  DNR  Symptom Management:   Per CCM, no additional needs at this time.  Palliative Prophylaxis:   Oral Care and Turn Reposition  Additional Recommendations (Limitations, Scope, Preferences):  No chest compressions, no defibrillation  Psycho-social/Spiritual:   Desire for further Chaplaincy support:no  Additional Recommendations: Caregiving  Support/Resources and Education on Hospice  Prognosis:   Unable to determine, if husband decides no trach, extubation to comfort care, days anticipated.  Discharge Planning: To be determined, based on family choice for trach      Primary Diagnoses: Present on Admission: . Acute on chronic kidney failure ([place])   I have reviewed the medical record, interviewed the patient and family, and  examined the patient. The following aspects are pertinent.  Past Medical History:  Diagnosis Date  . Anemia   . Anxiety   . Arthritis   . Cancer [facility])    Left Kidney Cancer  . CHF (congestive heart failure) ([place])   . Chronic kidney disease   . COPD (chronic obstructive pulmonary disease) ([facility])    Use Oxygen at bedtime  . Coronary artery disease   . Diabetes mellitus without complication ([place])   . Dialysis patient ([place])    Tues, Thurs, Sat  . Dyspnea    with exertion  . GERD (gastroesophageal reflux disease)   . Gout   .  History of kidney stones   . Hypertension   . Neuropathy   . Peripheral vascular disease ([place])   . Sleep apnea    OSA--USE C-PAP  . Stroke [facility]) 2004   Social History   Socioeconomic History  . Marital status: Married    Spouse name: Not on file  . Number of children: Not on file  . Years of education: Not on file  . Highest education level: Not on file  Occupational History  . Not on file  Social Needs  . Financial resource strain: Not on file  . Food insecurity    Worry: Not on file    Inability: Not on file  . Transportation needs    Medical: Not on file    Non-medical: Not on file  Tobacco Use  . Smoking status: Former Research scientist (life sciences)  . Smokeless tobacco: Never Used  Substance and Sexual Activity  . Alcohol use: No  . Drug use: No  . Sexual activity: Never  Lifestyle  . Physical activity    Days per week: Not on file    Minutes per session: Not on file  . Stress: Not on file  Relationships  . Social Herbalist on phone: Not on file    Gets together: Not on file    Attends religious service: Not on file    Active member of club or organization: Not on file    Attends meetings of clubs or organizations: Not on file    Relationship status: Not on file  Other Topics Concern  . Not on file  Social History Narrative  . Not on file   Family History  Problem Relation Age of Onset  . Hypertension Mother   . Heart failure Mother    . Diabetes Father   . Heart attack Father   . Lung cancer Maternal Aunt   . Cancer Maternal Aunt    Scheduled Meds: . allopurinol  150 mg Per Tube Once per day on Tue Thu Sat  . amitriptyline  25 mg Per Tube QHS  . atorvastatin  40 mg Per Tube Daily  . budesonide (PULMICORT) nebulizer solution  0.5 mg Nebulization BID  . chlorhexidine  15 mL Mouth/Throat BID  . Chlorhexidine Gluconate Cloth  6 each Topical Daily  . citalopram  20 mg Per Tube Daily  . clopidogrel  75 mg Per Tube Daily  . clotrimazole  1 Applicatorful Vaginal QHS  . epoetin (EPOGEN/PROCRIT) injection  10,000 Units Intravenous Q T,Th,Sa-HD  . famotidine  20 mg Per Tube QHS  . feeding supplement (PRO-STAT SUGAR FREE 64)  30 mL Per Tube 5 X Daily  . feeding supplement (VITAL HIGH PROTEIN)  1,000 mL Per Tube Q24H  . free water  30 mL Per Tube Q4H  . heparin injection (subcutaneous)  5,000 Units Subcutaneous Q8H  . hydrocortisone sod succinate (SOLU-CORTEF) inj  50 mg Intravenous Q12H  . insulin aspart  0-15 Units Subcutaneous Q4H  . insulin aspart  4 Units Subcutaneous Q4H  . insulin glargine  5 Units Subcutaneous BID  . ipratropium-albuterol  3 mL Nebulization Q4H  . mouth rinse  15 mL Mouth Rinse 10 times per day  . midodrine  10 mg Per Tube TID WC  . multivitamin  15 mL Per Tube Daily  . polyethylene glycol  17 g Oral BID  . senna-docusate  1 tablet Per Tube BID  . sodium chloride flush  10-40 mL Intracatheter Q12H   Continuous Infusions: . sodium  chloride 200 mL (05/10/19 1200)  . cefTAZidime (FORTAZ)  IV 2 g (05/17/19 2128)  . dextrose 20 mL/hr at 05/18/19 0900  . fentaNYL infusion INTRAVENOUS 250 mcg/hr (05/18/19 0900)   PRN Meds:.albuterol, fentaNYL, fentaNYL (SUBLIMAZE) injection, lidocaine-prilocaine, LORazepam, midazolam, nystatin, polyethylene glycol, polyvinyl alcohol, sodium chloride flush, vecuronium Medications Prior to Admission:  Prior to Admission medications   Medication Sig Start Date End Date  Taking? Authorizing Provider  acetaminophen (TYLENOL 8 HOUR) 650 MG CR tablet Take 650 mg by mouth every 8 (eight) hours as needed for pain.   Yes [provider]  albuterol (PROVENTIL HFA;VENTOLIN HFA) 108 (90 Base) MCG/ACT inhaler Inhale 2 puffs into the lungs every 6 (six) hours as needed for wheezing or shortness of breath.    Yes [provider]  allopurinol (ZYLOPRIM) 100 MG tablet Take 150 mg by mouth See admin instructions. Take 1 tablets (150mg ) by mouth daily on Tuesday, Thursday and Saturday with dialysis   Yes [provider]  amitriptyline (ELAVIL) 25 MG tablet Take 25 mg by mouth at bedtime.  02/16/14  Yes [provider]  amLODipine (NORVASC) 10 MG tablet Take 10 mg by mouth daily.   Yes [provider]  atorvastatin (LIPITOR) 40 MG tablet Take 40 mg by mouth daily.    Yes [provider]  calcium carbonate (TUMS - DOSED IN MG ELEMENTAL CALCIUM) 500 MG chewable tablet Chew 1 tablet by mouth as needed for indigestion or heartburn.   Yes [provider]  carboxymethylcellulose (REFRESH PLUS) 0.5 % SOLN Place 1-2 drops into both eyes 3 (three) times daily as needed (for dry eyes).   Yes [provider]  citalopram (CELEXA) 40 MG tablet Take 40 mg by mouth daily.  02/16/14  Yes [provider]  clopidogrel (PLAVIX) 75 MG tablet Take 75 mg by mouth daily.  02/16/14  Yes [provider]  diclofenac sodium (VOLTAREN) 1 % GEL Apply 1 g topically 2 (two) times daily as needed (for pain.).   Yes [provider]  docusate sodium (STOOL SOFTENER) 100 MG capsule Take 200 mg by mouth 2 (two) times daily.    Yes [provider]  fluticasone (FLONASE) 50 MCG/ACT nasal spray Place 2 sprays into both nostrils daily.  02/16/14  Yes [provider]  hydroxypropyl methylcellulose / hypromellose (ISOPTO TEARS / GONIOVISC) 2.5 % ophthalmic solution Place 1-2 drops into both eyes 4 (four) times  daily as needed for dry eyes.   Yes [provider]  lidocaine-prilocaine (EMLA) cream Apply 1 application topically as needed (prior to dialysis).   Yes [provider]  loratadine (CLARITIN) 10 MG tablet Take 10 mg by mouth at bedtime.   Yes [provider]  nystatin (MYCOSTATIN/NYSTOP) powder Apply 1 g topically daily as needed (for skin rashes.).  06/26/16  Yes [provider]  polyethylene glycol powder (GLYCOLAX/MIRALAX) powder Take 17 g by mouth 3 (three) times daily as needed for moderate constipation.    Yes [provider]  RENVELA 800 MG tablet Take 1,600 mg by mouth 3 (three) times daily with meals.    Yes [provider]  VICTOZA 18 MG/3ML SOPN Inject 1.2 mg into the skin daily.    Yes [provider]   Allergies  Allergen Reactions  . Iodine Rash  . Enalapril Other (See Comments)    TONGUE SWELLS/GOUT  . Iodinated Diagnostic Agents Other (See Comments)    BREAK OUT IN WHELPS     Review of Systems  Unable to perform ROS: Intubated    Physical Exam Vitals signs and nursing note reviewed.  Constitutional:      Appearance: She is obese.     Vital Signs: BP (!) 114/43   Pulse 79   Temp 97.8 F (36.6 C) (Axillary)   Resp (!) 8   Ht 5' 5.98" (1.676 m)   Wt (!) 149.2 kg   SpO2 92%   BMI 53.13 kg/m  Pain Scale: Faces POSS *See Group Information*: 1-Acceptable,Awake and alert Pain Score: Asleep   SpO2: SpO2: 92 % O2 Device:SpO2: 92 % O2 Flow Rate: .O2 Flow Rate (L/min): 15 L/min  IO: Intake/output summary:   Intake/Output Summary (Last 24 hours) at 05/18/2019 1021 Last data filed at 05/18/2019 0900 Gross per 24 hour  Intake 854.83 ml  Output 768 ml  Net 86.83 ml    LBM: Last BM Date: 05/18/19 Baseline Weight: Weight: 125.2 kg Most recent weight: Weight: (!) 149.2 kg     Palliative Assessment/Data:   Flowsheet Rows     Most Recent Value  Intake Tab  Referral Department  Critical care   Unit at Time of Referral  ICU  Palliative Care Primary Diagnosis  Pulmonary  Date Notified  05/18/19  Palliative Care Type  New Palliative care  Date of Admission  04/20/2019  Date first seen by Palliative Care  05/18/19  # of days Palliative referral response time  0 Day(s)  # of days IP prior to Palliative referral  20  Clinical Assessment  Palliative Performance Scale Score  20%  Pain Max last 24 hours  Not able to report  Pain Min Last 24 hours  Not able to report  Dyspnea Max Last 24 Hours  Not able to report  Dyspnea Min Last 24 hours  Not able to report  Psychosocial & Spiritual Assessment  Palliative Care Outcomes      Time In: 0910 Time Out: 1020  Time Total: 701 minutes Greater than 50%  of this time was spent counseling and coordinating care related to the above assessment and plan.  Signed by: Drue Novel, NP   Please contact Palliative Medicine Team phone at [phone] for questions and concerns.  For individual provider: See Shea Evans

## 2019-05-18 NOTE — Progress Notes (Signed)
[facility] [place] NAME: Ariyana Faw    MR#:  [id number]  DATE OF BIRTH:  1956/05/12     Patient remains critically ill.  Still on the ventilator.  CRRT done Saturday 50% FiO2.  Husband refused tracheostomy REVIEW OF SYSTEMS:   Unable to obtain due to patient being intubated and sedated  DRUG ALLERGIES:   Allergies  Allergen Reactions  . Iodine Rash  . Enalapril Other (See Comments)    TONGUE SWELLS/GOUT  . Iodinated Diagnostic Agents Other (See Comments)    BREAK OUT IN WHELPS      VITALS:  Blood pressure (!) 124/46, pulse 80, temperature 97.8 F (36.6 C), temperature source Axillary, resp. rate (!) 5, height 5' 5.98" (1.676 m), weight (!) 149.2 kg, SpO2 90 %.  PHYSICAL EXAMINATION:  GENERAL:  63 y.o.-morbidly obese and critically ill-appearing. EYES: Pupils equal, round, reactive to light  No scleral icterus.  HEENT: Head atraumatic, normocephalic.  Currently intubated, sedated. ETT in place. NECK:  Supple, no jugular venous distention. No thyroid enlargement, no tenderness.  LUNGS: +diffuse rhonchi and wheezing present CARDIOVASCULAR: RRR, S1, S2 normal. No murmurs, rubs, or gallops.  ABDOMEN: Soft, nontender, nondistended. Bowel sounds present.  EXTREMITIES: +pedal edema and bilateral hand edema, no cyanosis or clubbing.  NEUROLOGIC: Unable to do neuro exam because of intubation, sedation. PSYCHIATRIC: Intubated, sedated.   SKIN: No obvious rash, lesion, or ulcer.   LABORATORY PANEL:   CBC Recent Labs  Lab 05/18/19 0530  WBC 10.7*  HGB 7.3*  HCT 23.8*  PLT 156   ------------------------------------------------------------------------------------------------------------------  Chemistries  Recent Labs  Lab 05/18/19 0530  NA 138  K 4.5  CL 103  CO2 24  GLUCOSE 128*  BUN 26*  CREATININE 2.40*  CALCIUM 8.0*  MG 1.9    ------------------------------------------------------------------------------------------------------------------  Cardiac Enzymes No results for input(s): TROPONINI in the last 168 hours. ------------------------------------------------------------------------------------------------------------------  RADIOLOGY:  Dg Abd 1 View  Result Date: 05/18/2019 CLINICAL DATA:  Nasogastric placement. EXAM: ABDOMEN - 1 VIEW COMPARISON:  06/02/2019 FINDINGS: Nasogastric tube enters the stomach in has its tip in the fundus proximal body. IMPRESSION: Nasogastric tube tip in the stomach in the fundus or proximal body. Electronically Signed   By: Nelson Chimes M.D.   On: 05/18/2019 11:41    EKG:   Orders placed or performed during the hospital encounter of 05/01/2019  . EKG 12-Lead  . EKG 12-Lead  . EKG 12-Lead  . EKG 12-Lead    ASSESSMENT AND PLAN:   63 year old female patient with history of CVA, obstructive sleep apnea on CPAP, ESRD on hemodialysis Tuesday, Thursday, Saturday, congestive heart failure, COPD comes in because of worsening shortness of breath, she was called in the emergency room, had PEA, intubated, admitted to intensive care unit.  Acute hypoxic and hypercapneic respiratory failure- status post PEA arrest. Also with aspiration pneumonia -Extubated 6/2 and then had to be reintubated 6/5 -Vent management per CCM .  PRVC 50% discussed with intensivist -Plan was for trach, but patient unable to obtain trach due to severe hypoxia -Zosyn changed to ceftazidime -Sputum cultures growing pseudomonas  Shock- multi-factorial due to sepsis/hypovolemia/cardiogenic -On pressors.  Wean off for map 65 -Stress dose steroids -Continue midodrine tid -Abx as above   Failure to thrive Palliative care is involved and discussed with husband.  Has been refused tracheostomy and changed her CODE STATUS to DO NOT RESUSCITATE  ESRD- on hemodialysis TTS. Temporary HD cath placed 6/5. -Nephrology  following - CRRT done 05/16/2019.  Nephrology is planning to transferred her to hemodialysis Tuesday Thursday Saturday  Type 2 diabetes- blood sugars well controlled -Lantus and SSI  Hyperlipidemia- stable -Continue Lipitor  History of stroke -Continue Plavix  COPD- stable, no signs of acute exacerbation. -Continue nebs  Anemia in chronic kidney disease- hemoglobin low but stable. -EPO per nephrology  Extremely poor prognosis with high mortality   All the records are reviewed and case discussed with Care Management/Social Workerr. Management plans discussed with the patients daughter, husband CODE STATUS: DO NOT RESUSCITATE  TOTAL TIME TAKING CARE OF THIS PATIENT: 28 minutes.   Nicholes Mango M.D on 05/18/2019 at 1:26 PM  Between 7am to 6pm - Pager - [phone]  After 6pm go to [email or web address] - password [email or web address] [facility]  Office  [phone]  CC: Primary care physician; Smithson, [name], MD   Note: This dictation was prepared with Dragon dictation along with smaller phrase technology. Any transcriptional errors that result from this process are unintentional.

## 2019-05-18 NOTE — Progress Notes (Signed)
[facility]  ROUNDING NOTE   Subjective:   CRRT overnight. Running low on dialysis cartriges and CRRT baths.   Hemodynamically stable.   Objective:  Vital signs in last 24 hours:  Temp:  [97.9 F (36.6 C)-98.5 F (36.9 C)] 97.9 F (36.6 C) (06/15 0400) Pulse Rate:  [77-109] 79 (06/15 0600) Resp:  [0-26] 5 (06/15 0600) BP: (62-141)/(37-78) 115/39 (06/15 0600) SpO2:  [76 %-99 %] 93 % (06/15 0750) FiO2 (%):  [30 %-50 %] 50 % (06/15 0750) Weight:  [149.2 kg] 149.2 kg (06/15 0500)  Weight change: 0.907 kg Filed Weights   05/16/19 0500 05/17/19 0500 05/18/19 0500  Weight: (!) 152.9 kg (!) 148.3 kg (!) 149.2 kg    Intake/Output: I/O last 3 completed shifts: In: 1112.3 [I.V.:1112.3] Out: 8921 [Other:1176]   Intake/Output this shift:  No intake/output data recorded.  Physical Exam: General: Critically ill   Head: ETT  Eyes: Anicteric   Neck: obese  Lungs:  Clear to auscultation, PRVC 50%  Heart: Regular rate and rhythm  Abdomen:  Soft, nontender, obese  Extremities: + peripheral edema.  Neurologic: Intubated and sedated  Skin: No lesions  Access: Right femoral temp HD catheter, clotted left arm AVG    Basic Metabolic Panel: Recent Labs  Lab 05/17/19 1333 05/17/19 1820 05/17/19 2130 05/18/19 0115 05/18/19 0530  NA 138 138 138 138 138  K 4.4 4.5 4.3 4.4 4.5  CL 102 102 101 103 103  CO2 21* 22 26 25 24   GLUCOSE 106* 111* 124* 122* 128*  BUN 35* 38* 33* 29* 26*  CREATININE 2.84* 3.11* 2.76* 2.50* 2.40*  CALCIUM 8.2* 8.2* 8.1* 8.0* 8.0*  MG 2.1 1.9 2.0 1.8 1.9  PHOS 3.3 3.4 2.9 2.8 2.8    Liver Function Tests: Recent Labs  Lab 05/17/19 1333 05/17/19 1820 05/17/19 2130 05/18/19 0115 05/18/19 0530  ALBUMIN 2.6* 2.5* 2.5* 2.5* 2.4*   No results for input(s): LIPASE, AMYLASE in the last 168 hours. No results for input(s): AMMONIA in the last 168 hours.  CBC: Recent Labs  Lab 06/02/2019 0518  05/14/19 0429 05/15/19 0326 05/16/19 0555  05/16/19 1910 05/17/19 0628 05/18/19 0530  WBC 15.7*   < > 11.7* 15.8* 12.8* 17.0* 15.5* 10.7*  NEUTROABS 13.7*  --  9.5* 11.7* 9.7*  --  12.1*  --   HGB 7.6*   < > 7.9* 7.1* 6.9* 8.4* 8.3* 7.3*  HCT 24.1*   < > 24.8* 22.4* 22.8* 27.5* 27.2* 23.8*  MCV 93.1   < > 93.9 94.9 98.7 100.0 98.6 99.2  PLT 163   < > 132* 145* 155 157 172 156   < > = values in this interval not displayed.    Cardiac Enzymes: No results for input(s): CKTOTAL, CKMB, CKMBINDEX, TROPONINI in the last 168 hours.  BNP: Invalid input(s): POCBNP  CBG: Recent Labs  Lab 05/17/19 1621 05/17/19 1924 05/17/19 2114 05/18/19 0404 05/18/19 0724  GLUCAP 95 64* 116* 112* 110*    Microbiology: Results for orders placed or performed during the hospital encounter of 04/09/2019  SARS Coronavirus 2 (CEPHEID - Performed in [facility] hospital lab), Hosp Order     Status: None   Collection Time: 04/21/2019  7:31 AM   Specimen: Nasopharyngeal Swab  Result Value Ref Range Status   SARS Coronavirus 2 NEGATIVE NEGATIVE Final    Comment: (NOTE) If result is NEGATIVE SARS-CoV-2 target nucleic acids are NOT DETECTED. The SARS-CoV-2 RNA is generally detectable in upper and lower  respiratory  specimens during the acute phase of infection. The lowest  concentration of SARS-CoV-2 viral copies this assay can detect is 250  copies / mL. A negative result does not preclude SARS-CoV-2 infection  and should not be used as the sole basis for treatment or other  patient management decisions.  A negative result may occur with  improper specimen collection / handling, submission of specimen other  than nasopharyngeal swab, presence of viral mutation(s) within the  areas targeted by this assay, and inadequate number of viral copies  (<250 copies / mL). A negative result must be combined with clinical  observations, patient history, and epidemiological information. If result is POSITIVE SARS-CoV-2 target nucleic acids are DETECTED. The  SARS-CoV-2 RNA is generally detectable in upper and lower  respiratory specimens dur ing the acute phase of infection.  Positive  results are indicative of active infection with SARS-CoV-2.  Clinical  correlation with patient history and other diagnostic information is  necessary to determine patient infection status.  Positive results do  not rule out bacterial infection or co-infection with other viruses. If result is PRESUMPTIVE POSTIVE SARS-CoV-2 nucleic acids MAY BE PRESENT.   A presumptive positive result was obtained on the submitted specimen  and confirmed on repeat testing.  While 2019 novel coronavirus  (SARS-CoV-2) nucleic acids may be present in the submitted sample  additional confirmatory testing may be necessary for epidemiological  and / or clinical management purposes  to differentiate between  SARS-CoV-2 and other Sarbecovirus currently known to infect humans.  If clinically indicated additional testing with an alternate test  methodology [id number]) is advised. The SARS-CoV-2 RNA is generally  detectable in upper and lower respiratory sp ecimens during the acute  phase of infection. The expected result is Negative. Fact Sheet for Patients:  StrictlyIdeas.no Fact Sheet for Healthcare Providers: BankingDealers.co.za This test is not yet approved or cleared by the [place] FDA and has been authorized for detection and/or diagnosis of SARS-CoV-2 by FDA under an Emergency Use Authorization (EUA).  This EUA will remain in effect (meaning this test can be used) for the duration of the COVID-19 declaration under Section 564(b)(1) of the Act, 21 U.S.C. section 360bbb-3(b)(1), unless the authorization is terminated or revoked sooner. Performed at [facility], [address] [place], [place] [address]   MRSA PCR Screening     Status: None   Collection Time: 05/01/2019  9:04 AM   Specimen: Nasal Mucosa;  Nasopharyngeal  Result Value Ref Range Status   MRSA by PCR NEGATIVE NEGATIVE Final    Comment:        The GeneXpert MRSA Assay (FDA approved for NASAL specimens only), is one component of a comprehensive MRSA colonization surveillance program. It is not intended to diagnose MRSA infection nor to guide or monitor treatment for MRSA infections. Performed at [facility], [address] [place], [place] [address]   Culture, respiratory (non-expectorated)     Status: None   Collection Time: 05/01/19 11:05 AM   Specimen: Tracheal Aspirate; Respiratory  Result Value Ref Range Status   Specimen Description   Final    TRACHEAL ASPIRATE Performed at [facility], [address], [place], [place] [address]    Special Requests   Final    NONE Performed at [facility], [address] [place], [place] [address]    Gram Stain   Final    MODERATE WBC PRESENT, PREDOMINANTLY PMN RARE YEAST Performed at [facility], [place]  [address], [place], [place] [address]    Culture FEW CANDIDA ALBICANS FEW CANDIDA TROPICALIS   Final   Report Status 05/04/2019 FINAL  Final  Culture, respiratory     Status: None   Collection Time: 05/09/19  6:30 PM   Specimen: INDUCED SPUTUM  Result Value Ref Range Status   Specimen Description   Final    INDUCED SPUTUM Performed at [facility], [address], [place], [place] [address]    Special Requests   Final    NONE Performed at [facility], [address] [place], [place] [address]    Gram Stain   Final    MODERATE WBC PRESENT, PREDOMINANTLY PMN FEW GRAM NEGATIVE RODS Performed at [facility], [place] [address] [place], [place] [address]    Culture MODERATE PSEUDOMONAS AERUGINOSA  Final   Report Status 05/09/2019 FINAL  Final   Organism ID, Bacteria PSEUDOMONAS AERUGINOSA  Final      Susceptibility   Pseudomonas aeruginosa - MIC*    CEFTAZIDIME 2 SENSITIVE Sensitive      CIPROFLOXACIN <=0.25 SENSITIVE Sensitive     GENTAMICIN <=1 SENSITIVE Sensitive     IMIPENEM 2 SENSITIVE Sensitive     PIP/TAZO <=4 SENSITIVE Sensitive     CEFEPIME <=1 SENSITIVE Sensitive     * MODERATE PSEUDOMONAS AERUGINOSA    Coagulation Studies: No results for input(s): LABPROT, INR in the last 72 hours.  Urinalysis: No results for input(s): COLORURINE, LABSPEC, PHURINE, GLUCOSEU, HGBUR, BILIRUBINUR, KETONESUR, PROTEINUR, UROBILINOGEN, NITRITE, LEUKOCYTESUR in the last 72 hours.  Invalid input(s): APPERANCEUR    Imaging: No results found.   Medications:   . sodium chloride 200 mL (05/10/19 1200)  . cefTAZidime (FORTAZ)  IV 2 g (05/17/19 2128)  . dextrose 20 mL/hr at 05/18/19 0600  . fentaNYL infusion INTRAVENOUS 250 mcg/hr (05/18/19 0600)  . heparin Stopped (05/19/2019 0530)  . norepinephrine (LEVOPHED) Adult infusion Stopped (05/17/19 1408)  . pureflow 2,500 mL/hr at 05/17/19 0141  . vasopressin (PITRESSIN) infusion - *FOR SHOCK* Stopped (05/10/19 1634)   . allopurinol  150 mg Per Tube Once per day on Tue Thu Sat  . amitriptyline  25 mg Per Tube QHS  . atorvastatin  40 mg Per Tube Daily  . budesonide (PULMICORT) nebulizer solution  0.5 mg Nebulization BID  . chlorhexidine  15 mL Mouth/Throat BID  . Chlorhexidine Gluconate Cloth  6 each Topical Daily  . citalopram  20 mg Per Tube Daily  . clopidogrel  75 mg Per Tube Daily  . clotrimazole  1 Applicatorful Vaginal QHS  . epoetin (EPOGEN/PROCRIT) injection  10,000 Units Intravenous Q T,Th,Sa-HD  . famotidine  20 mg Per Tube QHS  . feeding supplement (PRO-STAT SUGAR FREE 64)  30 mL Per Tube 5 X Daily  . feeding supplement (VITAL HIGH PROTEIN)  1,000 mL Per Tube Q24H  . free water  30 mL Per Tube Q4H  . heparin injection (subcutaneous)  5,000 Units Subcutaneous Q8H  . hydrocortisone sod succinate (SOLU-CORTEF) inj  50 mg Intravenous Q12H  . insulin aspart  0-15 Units Subcutaneous Q4H  . insulin aspart  4 Units  Subcutaneous Q4H  . insulin glargine  5 Units Subcutaneous BID  . ipratropium-albuterol  3 mL Nebulization Q4H  . mouth rinse  15 mL Mouth Rinse 10 times per day  . midodrine  10 mg Per Tube TID WC  . multivitamin  15 mL Per Tube Daily  . polyethylene glycol  17 g Oral BID  . senna-docusate  1 tablet Per Tube BID  . sodium chloride flush  10-40 mL Intracatheter Q12H   albuterol, fentaNYL, fentaNYL (SUBLIMAZE) injection, heparin, heparin, lidocaine-prilocaine, LORazepam, midazolam, nystatin, polyethylene glycol, polyvinyl alcohol, sodium chloride flush, vecuronium  Assessment/ Plan:  Ms. [name] is a 63 y.o. black female with end stage renal disease on hemodialysis, hypertension, CVA, obstructive sleep apnea, peripheral vascular disease, hypertension, gout, GERD, diabetes mellitus type II, COPD, coronary artery disease, congestive heart failure, whowas admitted to [facility] on 5/26/2020for pneumonia. Acute respiratory failure requiring mechanical ventilation and vasopressors. Placed on CRRT. Did not tolerate intermittent hemodialysis.  TTS [facility] left AVG   1. End stage renal disease: on CRRT Off vasopressors.  Will hold CRRT Transtion to intermittent hemodialysis on TTS schedule. Evaluate daily for dialysis need. Next treatment for Tuesday.   2. Anemia of chronic kidney disease: hemoglobin 7.3 - EPO with HD treatment TTS  3. Hypotension: off vasopressors.   4. Acute respiratory failure with cardiopulmonary arrest. Extubated 6/2, re-intubated 6/5 Per ENT patient will require transfer to [facility] for placement of tracheostomy.   LOS: 20   6/15/20208:55 AM

## 2019-05-18 NOTE — Progress Notes (Signed)
Double lumen duo glide packed with heparin per policy and procedure.

## 2019-05-18 NOTE — Progress Notes (Signed)
Follow up - Critical Care Medicine Note  Patient Details:    [name] is an 63 y.o. female.morbidly obese,with severe end stage renal disease s/p cardiac arrest and hypothermia protocol with recurrent resp failure from acute diastolic heart failure and aspiration pneumonia  Lines, Airways, Drains: Airway 8 mm (Active)  Secured at (cm) 24 cm 05/31/2019  4:00 PM  Measured From Lips 05/28/2019  4:00 PM  Secured Location Right 05/18/2019  4:00 PM  Secured By [employer] 05/08/2019  4:00 PM  Tube Holder Repositioned Yes 06/01/2019  1:45 PM  Cuff Pressure (cm H2O) 26 cm H2O 05/26/2019  1:45 PM  Site Condition Cool;Dry 05/05/2019  4:00 PM     NG/OG Tube Orogastric Center mouth Xray (Active)  Cm Marking at Nare/Corner of Mouth (if applicable) 57 cm 04/07/2562  4:00 PM  Site Assessment Clean;Dry;Intact 05/11/2019  4:00 PM  Ongoing Placement Verification No change in cm markings or external length of tube from initial placement;No acute changes, not attributed to clinical condition 05/18/2019  4:00 PM  Status Infusing tube feed 05/19/2019  4:00 PM  Intake (mL) 35 mL 05/07/2019  5:03 PM    Anti-infectives:  Anti-infectives (From admission, onward)   Start     Dose/Rate Route Frequency Ordered Stop   05/15/19 2200  cefTAZidime (FORTAZ) 2 g in sodium chloride 0.9 % 100 mL IVPB     2 g 200 mL/hr over 30 Minutes Intravenous Every 12 hours 05/15/19 1500     05/14/19 1800  cefTAZidime (FORTAZ) 1 g in sodium chloride 0.9 % 100 mL IVPB  Status:  Discontinued     1 g 200 mL/hr over 30 Minutes Intravenous Every 24 hours 05/14/19 1601 05/15/19 1500   05/10/19 0600  piperacillin-tazobactam (ZOSYN) IVPB 3.375 g  Status:  Discontinued     3.375 g 12.5 mL/hr over 240 Minutes Intravenous Every 8 hours 05/09/19 2136 05/14/19 1009   05/09/19 2100  piperacillin-tazobactam (ZOSYN) IVPB 3.375 g  Status:  Discontinued     3.375 g 12.5 mL/hr over 240 Minutes Intravenous Every 12 hours 05/09/19 1835 05/09/19 1937    05/09/19 2000  piperacillin-tazobactam (ZOSYN) IVPB 3.375 g  Status:  Discontinued     3.375 g 12.5 mL/hr over 240 Minutes Intravenous Every 12 hours 05/09/19 1937 05/09/19 2136   05/11/2019 0215  ceFAZolin (ANCEF) IVPB 1 g/50 mL premix     1 g 100 mL/hr over 30 Minutes Intravenous  Once 05/16/2019 0210 05/26/2019 0404   05/01/19 1200  azithromycin (ZITHROMAX) 500 mg in sodium chloride 0.9 % 250 mL IVPB  Status:  Discontinued     500 mg 250 mL/hr over 60 Minutes Intravenous Every 24 hours 05/01/19 0940 05/05/19 0915   05/01/19 1000  doxycycline (VIBRAMYCIN) 100 mg in sodium chloride 0.9 % 250 mL IVPB  Status:  Discontinued     100 mg 125 mL/hr over 120 Minutes Intravenous Every 12 hours 05/01/19 0940 05/05/19 0915   04/30/19 2200  ceFEPIme (MAXIPIME) 2 g in sodium chloride 0.9 % 100 mL IVPB  Status:  Discontinued     2 g 200 mL/hr over 30 Minutes Intravenous Every 24 hours 04/30/19 0708 04/30/19 1431   04/30/19 2200  ceFEPIme (MAXIPIME) 2 g in sodium chloride 0.9 % 100 mL IVPB  Status:  Discontinued     2 g 200 mL/hr over 30 Minutes Intravenous Every 12 hours 04/30/19 1431 05/01/19 0940   04/29/19 1800  ceFEPIme (MAXIPIME) 1 g in sodium chloride 0.9 % 100  mL IVPB  Status:  Discontinued     1 g 200 mL/hr over 30 Minutes Intravenous Every 24 hours 04/15/2019 1156 04/22/2019 1340   04/29/19 1200  vancomycin (VANCOCIN) IVPB 1000 mg/200 mL premix  Status:  Discontinued     1,000 mg 200 mL/hr over 60 Minutes Intravenous Every 24 hours 04/30/2019 1340 04/25/2019 1853   04/29/2019 2200  ceFEPIme (MAXIPIME) 2 g in sodium chloride 0.9 % 100 mL IVPB  Status:  Discontinued     2 g 200 mL/hr over 30 Minutes Intravenous Every 12 hours 04/29/2019 1340 04/30/19 0708   04/20/2019 1200  vancomycin (VANCOCIN) IVPB 1000 mg/200 mL premix  Status:  Discontinued     1,000 mg 200 mL/hr over 60 Minutes Intravenous  Once 04/26/2019 1153 04/18/2019 1853   04/15/2019 0830  vancomycin (VANCOCIN) IVPB 1000 mg/200 mL premix     1,000 mg 200  mL/hr over 60 Minutes Intravenous  Once 04/26/2019 0820 04/04/2019 1200   04/18/2019 0830  ceFEPIme (MAXIPIME) 2 g in sodium chloride 0.9 % 100 mL IVPB     2 g 200 mL/hr over 30 Minutes Intravenous  Once 04/12/2019 0820 04/15/2019 1022      Microbiology: Results for orders placed or performed during the hospital encounter of 04/19/2019  SARS Coronavirus 2 (CEPHEID - Performed in [facility] hospital lab), Hosp Order     Status: None   Collection Time: 04/09/2019  7:31 AM   Specimen: Nasopharyngeal Swab  Result Value Ref Range Status   SARS Coronavirus 2 NEGATIVE NEGATIVE Final    Comment: (NOTE) If result is NEGATIVE SARS-CoV-2 target nucleic acids are NOT DETECTED. The SARS-CoV-2 RNA is generally detectable in upper and lower  respiratory specimens during the acute phase of infection. The lowest  concentration of SARS-CoV-2 viral copies this assay can detect is 250  copies / mL. A negative result does not preclude SARS-CoV-2 infection  and should not be used as the sole basis for treatment or other  patient management decisions.  A negative result may occur with  improper specimen collection / handling, submission of specimen other  than nasopharyngeal swab, presence of viral mutation(s) within the  areas targeted by this assay, and inadequate number of viral copies  (<250 copies / mL). A negative result must be combined with clinical  observations, patient history, and epidemiological information. If result is POSITIVE SARS-CoV-2 target nucleic acids are DETECTED. The SARS-CoV-2 RNA is generally detectable in upper and lower  respiratory specimens dur ing the acute phase of infection.  Positive  results are indicative of active infection with SARS-CoV-2.  Clinical  correlation with patient history and other diagnostic information is  necessary to determine patient infection status.  Positive results do  not rule out bacterial infection or co-infection with other viruses. If result is  PRESUMPTIVE POSTIVE SARS-CoV-2 nucleic acids MAY BE PRESENT.   A presumptive positive result was obtained on the submitted specimen  and confirmed on repeat testing.  While 2019 novel coronavirus  (SARS-CoV-2) nucleic acids may be present in the submitted sample  additional confirmatory testing may be necessary for epidemiological  and / or clinical management purposes  to differentiate between  SARS-CoV-2 and other Sarbecovirus currently known to infect humans.  If clinically indicated additional testing with an alternate test  methodology [phone]) is advised. The SARS-CoV-2 RNA is generally  detectable in upper and lower respiratory sp ecimens during the acute  phase of infection. The expected result is Negative. Fact Sheet for Patients:  StrictlyIdeas.no Fact Sheet for Healthcare Providers: BankingDealers.co.za This test is not yet approved or cleared by the [place] FDA and has been authorized for detection and/or diagnosis of SARS-CoV-2 by FDA under an Emergency Use Authorization (EUA).  This EUA will remain in effect (meaning this test can be used) for the duration of the COVID-19 declaration under Section 564(b)(1) of the Act, 21 U.S.C. section 360bbb-3(b)(1), unless the authorization is terminated or revoked sooner. Performed at [facility], [address] [place], [place] [address]   MRSA PCR Screening     Status: None   Collection Time: 04/09/2019  9:04 AM   Specimen: Nasal Mucosa; Nasopharyngeal  Result Value Ref Range Status   MRSA by PCR NEGATIVE NEGATIVE Final    Comment:        The GeneXpert MRSA Assay (FDA approved for NASAL specimens only), is one component of a comprehensive MRSA colonization surveillance program. It is not intended to diagnose MRSA infection nor to guide or monitor treatment for MRSA infections. Performed at [facility], [address] [place], [place] [address]    Culture, respiratory (non-expectorated)     Status: None   Collection Time: 05/01/19 11:05 AM   Specimen: Tracheal Aspirate; Respiratory  Result Value Ref Range Status   Specimen Description   Final    TRACHEAL ASPIRATE Performed at [facility], [address], [place], [place] [address]    Special Requests   Final    NONE Performed at [facility], [address] [place], [place] [address]    Gram Stain   Final    MODERATE WBC PRESENT, PREDOMINANTLY PMN RARE YEAST Performed at [facility], [place] [address], [place], [place] [address]    Culture FEW CANDIDA ALBICANS FEW CANDIDA TROPICALIS   Final   Report Status 05/04/2019 FINAL  Final  Culture, respiratory     Status: None   Collection Time: 05/09/19  6:30 PM   Specimen: INDUCED SPUTUM  Result Value Ref Range Status   Specimen Description   Final    INDUCED SPUTUM Performed at [facility], [address] [place], [place] [address]    Special Requests   Final    NONE Performed at [facility], [address] [place], [place] [address]    Gram Stain   Final    MODERATE WBC PRESENT, PREDOMINANTLY PMN FEW GRAM NEGATIVE RODS Performed at [facility], [place] [address], [place], [place] [address]    Culture MODERATE PSEUDOMONAS AERUGINOSA  Final   Report Status 05/07/2019 FINAL  Final   Organism ID, Bacteria PSEUDOMONAS AERUGINOSA  Final      Susceptibility   Pseudomonas aeruginosa - MIC*    CEFTAZIDIME 2 SENSITIVE Sensitive     CIPROFLOXACIN <=0.25 SENSITIVE Sensitive     GENTAMICIN <=1 SENSITIVE Sensitive     IMIPENEM 2 SENSITIVE Sensitive     PIP/TAZO <=4 SENSITIVE Sensitive     CEFEPIME <=1 SENSITIVE Sensitive     * MODERATE PSEUDOMONAS AERUGINOSA    Best Practice/Protocols:  VTE Prophylaxis: Heparin (drip) GI Prophylaxis: Antihistamine   Continous Sedation Hyperglycemia (ICU)  Events: 5/26 ICU admission for resp failure and cardiac arrest 5/26  hypothermia protocol 6/3 successfully extubated 6/5 re-admitted to the ICU for aspiration pneumonia and progressive diastolic heart failure and renal failure 6/6 plan for [facility], ENT consulted, remains critically ill on vent 6/6 near cardiac arrest, fio2 100%, PEEP 15 6/7 severe resp failure, multiorgan failure 6/9 declined  for trach due to high FiO2 requirements  Studies: Dg Abd 1 View  Result Date: 05/18/2019 CLINICAL DATA:  Nasogastric placement. EXAM: ABDOMEN - 1 VIEW COMPARISON:  05/13/2019 FINDINGS: Nasogastric tube enters the stomach in has its tip in the fundus proximal body. IMPRESSION: Nasogastric tube tip in the stomach in the fundus or proximal body. Electronically Signed   By: Nelson Chimes M.D.   On: 05/18/2019 11:41   Dg Abd 1 View  Result Date: 05/31/2019 CLINICAL DATA:  OG tube placement. EXAM: ABDOMEN - 1 VIEW COMPARISON:  04/30/2019. FINDINGS: OG tube noted with tip below left hemidiaphragm. Severely distended loops of bowel are noted. Abdominal series suggested for further evaluation. No definite free air noted. IMPRESSION: NG tube noted with its tip under the left hemidiaphragm. Severely distended loops of bowel are noted. Abdominal series suggested for further evaluation. Electronically Signed   By: Marcello Moores  Register   On: 05/13/2019 15:50   Dg Chest Port 1 View  Result Date: 05/15/2019 CLINICAL DATA:  Acute respiratory failure EXAM: PORTABLE CHEST 1 VIEW COMPARISON:  05/13/2019 FINDINGS: Cardiac shadow is stable. Endotracheal tube, gastric catheter and hero graft are again noted and stable. Mild vascular congestion is seen with small bilateral pleural effusions and bibasilar atelectasis. The overall appearance is roughly stable from the prior exam. IMPRESSION: Stable vascular congestion with effusions and atelectasis. Tubes and lines as described. Electronically Signed   By: Inez Catalina M.D.   On: 05/15/2019 08:11   Dg Chest Port 1 View  Result Date: 05/13/2019 CLINICAL  DATA:  Respiratory failure EXAM: PORTABLE CHEST 1 VIEW COMPARISON:  05/10/2019 FINDINGS: Endotracheal tube and nasogastric catheter are noted in satisfactory position. Hero graft is again seen and unchanged. Cardiac shadow is stable. The overall inspiratory effort is poor with elevation of the right hemidiaphragm. Small effusions are noted bilaterally. Mild bibasilar atelectasis is again seen. No new focal abnormality is noted. IMPRESSION: Stable appearance of the chest with small effusions and bibasilar atelectasis. Electronically Signed   By: Inez Catalina M.D.   On: 05/13/2019 07:22   Dg Chest Port 1 View  Result Date: 05/10/2019 CLINICAL DATA:  Acute respiratory failure. EXAM: PORTABLE CHEST 1 VIEW COMPARISON:  Chest radiograph 05/09/2019 FINDINGS: ET tube terminates in the mid trachea. Enteric tube courses inferior to the diaphragm. Central venous catheter tip projects over the superior vena cava. Stable cardiomegaly. Elevation right hemidiaphragm. Similar-appearing bilateral mid lower lung heterogeneous opacities. Probable small left pleural effusion. IMPRESSION: Similar-appearing mid and lower lung airspace opacities. Possible small left pleural effusion. Electronically Signed   By: Lovey Newcomer M.D.   On: 05/10/2019 06:47   Dg Chest Port 1 View  Result Date: 05/09/2019 CLINICAL DATA:  Congestive heart failure.  Hypoxia.  Ex-smoker. EXAM: PORTABLE CHEST 1 VIEW COMPARISON:  05/09/2019 at 0552 hours. FINDINGS: 1817 hours. Large bore left-sided central line terminates at the high SVC. Endotracheal tube is difficult to visualize centrally but likely terminates 2.9 cm above carina. Nasogastric tube extends beyond the inferior aspect of the film. Mildly degraded exam due to AP portable technique and patient body habitus. Normal heart size for level of inspiration. Right costophrenic angle minimally excluded. moderate right hemidiaphragm elevation with extremely low lung volumes. No overt congestive heart  failure. Suspect residual right base airspace disease. IMPRESSION: Cardiomegaly and extremely low lung volumes. Probable residual right base airspace disease/atelectasis. Electronically Signed   By: Abigail Miyamoto M.D.   On: 05/09/2019 18:59   Dg Chest [facility] 1 View  Result  Date: 05/09/2019 CLINICAL DATA:  Acute respiratory failure EXAM: PORTABLE CHEST 1 VIEW COMPARISON:  05/30/2019 FINDINGS: Endotracheal tube tip is just above the level of the carina. Retraction by 2-3 cm would place it at the level of the clavicular heads. Enteric tube side port projects over the stomach. Left IJ large bore central venous catheter is unchanged. Bibasilar atelectasis. IMPRESSION: Endotracheal tube tip just above the level of the carina. Retraction by 2-3 cm is recommended. Electronically Signed   By: Ulyses Jarred M.D.   On: 05/09/2019 06:21   Dg Chest Port 1 View  Result Date: 05/24/2019 CLINICAL DATA:  Intubation EXAM: PORTABLE CHEST 1 VIEW COMPARISON:  Portable exam 1454 hours compared to 05/07/2019 FINDINGS: Tip of endotracheal tube projects 2.1 cm above carina. Nasogastric tube extends into abdomen. Rotated to the RIGHT. LEFT jugular large-bore central venous catheter with tip projecting over SVC. Enlargement of cardiac silhouette with pulmonary vascular congestion. Probable pulmonary edema and layered RIGHT pleural effusion IMPRESSION: Tip of endotracheal tube projects 2.1 cm above carina. Question pulmonary edema and layering RIGHT pleural effusion. Electronically Signed   By: Lavonia Dana M.D.   On: 06/01/2019 15:49   Dg Chest Port 1 View  Result Date: 05/07/2019 CLINICAL DATA:  Shortness of breath. EXAM: PORTABLE CHEST 1 VIEW COMPARISON:  Radiographs of May 06, 2019. FINDINGS: Stable cardiomediastinal silhouette. Left internal jugular catheter is unchanged. No pneumothorax or significant pleural effusion is noted. Minimal bibasilar subsegmental atelectasis is noted. Bony thorax is unremarkable. IMPRESSION: Minimal  bibasilar subsegmental atelectasis. Electronically Signed   By: Marijo Conception M.D.   On: 05/07/2019 09:55   Dg Chest Port 1 View  Result Date: 05/06/2019 CLINICAL DATA:  Shortness of breath EXAM: PORTABLE CHEST 1 VIEW COMPARISON:  Two days ago FINDINGS: Cardiomegaly and vascular pedicle widening. Very low volume chest with hazy opacity on both sides. Dialysis catheter from the left with tip near the SVC origin. Trachea and esophagus have been extubated. IMPRESSION: Unchanged very low lung volumes with atelectasis and vascular congestion. There may be layering pleural effusions. Electronically Signed   By: Monte Fantasia M.D.   On: 05/06/2019 05:31   Dg Chest Port 1 View  Result Date: 05/04/2019 CLINICAL DATA:  Acute respiratory failure EXAM: PORTABLE CHEST 1 VIEW COMPARISON:  05/01/2019 FINDINGS: Endotracheal tube, gastric catheter and hero graft are again noted and stable. Cardiac shadow is stable. Right upper lobe atelectasis is noted along the minor fissure new from the prior exam. Some improved aeration in right base is noted when compare with the prior exam. Mild vascular congestion is noted. Small left effusion is noted. IMPRESSION: Improved aeration in the right base although some right upper lobe atelectasis is noted. Mild vascular congestion. Electronically Signed   By: Inez Catalina M.D.   On: 05/04/2019 07:17   Dg Chest Port 1 View  Result Date: 05/01/2019 CLINICAL DATA:  Shortness of breath. EXAM: PORTABLE CHEST 1 VIEW COMPARISON:  Chest x-ray from same day at 3:54 a.m. FINDINGS: The patient remains rotated to the right. Unchanged endotracheal and enteric tubes. Unchanged left HeRO graft. Stable cardiomegaly. Normal pulmonary vascularity. Chronic elevation of the right hemidiaphragm with right basilar atelectasis/scarring. Unchanged hazy opacity at the left lung base likely reflecting a combination of atelectasis and small left pleural effusion. No pneumothorax. No acute osseous abnormality.  IMPRESSION: 1. Stable chest with bibasilar atelectasis and probable small left pleural effusion. Electronically Signed   By: Titus Dubin M.D.   On: 05/01/2019 10:20  Dg Chest Port 1 View  Result Date: 05/01/2019 CLINICAL DATA:  Acute respiratory failure. EXAM: PORTABLE CHEST 1 VIEW COMPARISON:  04/29/2019. FINDINGS: Endotracheal tube, NG tube, large caliber left subclavian line in stable position. Stable cardiomegaly. Bilateral pulmonary infiltrates/edema slightly progressed from prior exam. Bibasilar atelectasis particular prominent right lung base. Bilateral pleural effusions cannot be excluded. No pneumothorax. IMPRESSION: 1.  Lines and tubes stable position. 2. Cardiomegaly unchanged. Diffuse bilateral pulmonary infiltrates/edema slightly progressed from prior exam. Bibasilar atelectasis particular prominent the right lung base. Bilateral pleural effusions cannot be excluded. Electronically Signed   By: Marcello Moores  Register   On: 05/01/2019 06:15   Dg Chest Port 1 View  Result Date: 04/29/2019 CLINICAL DATA:  Acute respiratory failure. EXAM: PORTABLE CHEST 1 VIEW COMPARISON:  Single-view of the chest earlier today and 04/22/2019. FINDINGS: Support tubes and lines are unchanged since the most recent comparison. There is cardiomegaly without edema. Bibasilar airspace disease has worsened on the right. No pneumothorax. There may be a right pleural effusion. IMPRESSION: No change in support apparatus. Bibasilar airspace disease is worse on the right. There may be a right pleural effusion. Cardiomegaly. Electronically Signed   By: Inge Rise M.D.   On: 04/29/2019 18:32   Dg Chest Port 1 View  Result Date: 04/29/2019 CLINICAL DATA:  Acute respiratory failure EXAM: PORTABLE CHEST 1 VIEW COMPARISON:  Yesterday FINDINGS: Endotracheal tube tip between the clavicular heads and carina. Left subclavian dialysis catheter with tip at the brachiocephalic SVC confluence. There is an orogastric tube which at  least reaches the stomach. Haziness of the bilateral lower chest with obscuration of the left diaphragm. No Kerley lines, effusion, or pneumothorax. Cardiomegaly.  Rightward rotation. IMPRESSION: 1. Stable hardware positioning. 2. Worsening lower lobe aeration. Electronically Signed   By: Monte Fantasia M.D.   On: 04/29/2019 06:50   Dg Chest Port 1 View  Result Date: 04/14/2019 CLINICAL DATA:  63 year old female history intubation EXAM: PORTABLE CHEST 1 VIEW COMPARISON:  04/14/2019, 03/28/2015 FINDINGS: Cardiomediastinal silhouette unchanged in size and contour with low lung volumes accentuating the diameter of the mediastinum. Asymmetric elevation of the right hemidiaphragm. Coarsened interstitial markings with interlobular septal thickening and thickening of the minor fissure. Patchy opacity in the right lung base again demonstrated. Interval placement of endotracheal tube which terminates 2.5 cm above the carina. Interval placement of gastric tube terminating out of the field of view. Interval placement of defibrillator pads. Left upper extremity HERO graft, with the tip terminating in the region of the superior vena cava. IMPRESSION: Interval placement of endotracheal tube terminating 2.5 cm above the carina. Interval placement of gastric tube terminating in the abdomen out of the field of view. Interval placement of defibrillator pads. Similar appearance of low lung volumes, with background changes of either chronic scarring and/or early pulmonary edema, with similar appearance airspace disease at the right lung base. Left upper extremity HeRO graft Electronically Signed   By: Corrie Mckusick D.O.   On: 04/27/2019 13:18   Dg Chest Portable 1 View  Result Date: 05/03/2019 CLINICAL DATA:  Short of breath EXAM: PORTABLE CHEST 1 VIEW COMPARISON:  03/28/2015 FINDINGS: New left upper extremity and jugular hair 0 catheter with its tip in the upper SVC. Cardiomegaly. Normal vascularity. Bibasilar opacities  likely combination of volume loss and pleural fluid. No pneumothorax. Triangular opacity at the medial right lung base is noted representing either consolidation in the right lower lobe or collapse. IMPRESSION: Right lower lobe collapse versus consolidation. Bibasilar opacities likely combination  of pleural fluid and atelectasis. Electronically Signed   By: Marybelle Killings M.D.   On: 05/01/2019 08:04   Dg Abd Portable 1v  Result Date: 04/10/2019 CLINICAL DATA:  OG tube placement. EXAM: PORTABLE ABDOMEN - 1 VIEW COMPARISON:  One-view chest x-ray FINDINGS: OG tube terminates in the stomach. The stomach is mildly distended. The lung bases are clear. IMPRESSION: 1. OG tube terminates in the stomach. Electronically Signed   By: San Morelle M.D.   On: 04/30/2019 13:29    Consults: Treatment Team:  Algernon Huxley, MD [facility], Ander Gaster, MD Beverly Gust, MD   Subjective:    Overnight Issues: Remains sedated and on the ventilator.  FiO2 requirements down to 50%.  Off of CRRT due to issues with CRRT cartridges.  Objective:  Vital signs for last 24 hours: Temp:  [97.8 F (36.6 C)-98.5 F (36.9 C)] 97.8 F (36.6 C) (06/15 0900) Pulse Rate:  [77-109] 79 (06/15 0900) Resp:  [0-26] 8 (06/15 0900) BP: (102-141)/(35-78) 114/43 (06/15 0900) SpO2:  [76 %-97 %] 92 % (06/15 0900) FiO2 (%):  [30 %-50 %] 50 % (06/15 1121) Weight:  [149.2 kg] 149.2 kg (06/15 0500)  Hemodynamic parameters for last 24 hours:    Intake/Output from previous day: 06/14 0701 - 06/15 0700 In: 759.8 [I.V.:759.8] Out: 695   Intake/Output this shift: Total I/O In: 135 [I.V.:135] Out: 73 [Other:73]  Vent settings for last 24 hours: Vent Mode: PRVC FiO2 (%):  [30 %-50 %] 50 % Set Rate:  [26 bmp] 26 bmp Vt Set:  [480 mL] 480 mL PEEP:  [8 cmH20-10 cmH20] 10 cmH20 Plateau Pressure:  [20 FAO13-08 cmH20] 24 cmH20  Physical Exam:  GENERAL: Massively obese, intubated, sedated, poorly responsive when wake-up  assessment done HEAD: Normocephalic, atraumatic.  EYES: Pupils equal, round, reactive to light. No scleral icterus.  MOUTH: Moist membranes. NECK: Very thick neck, supple.  Trachea midline, no crepitus.  PULMONARY: +rhonchi, no wheezing, or rales. CARDIOVASCULAR: S1 and S2. Regular rate and rhythm. No murmurs, rubs, or gallops.  GASTROINTESTINAL: Soft, protuberant.Positive bowel sounds.  MUSCULOSKELETAL: Anasarca 3+ evident, no clubbing, no joint deformity.   NEUROLOGIC: obtunded, poorly responsive on wake-up assessment. SKIN:intact,warm,dry stage 2 skin breakdown, sacral noted by nursing 6/13.  Assessment/Plan:  1.  Acute on chronic hypoxic/hypercarbic respiratory failure: Continue ventilator support.  She has high FiO2 requirements requiring 70% today.  Did not tolerate titration down.  She has been turned down for tracheostomy due to this.  Continue supportive care.  O2 requirements and PEEP are down.  2.  Aspiration pneumonia: Continue antibiotics.  She will need 7 days of coverage.  Will be completing.  3.  Acute on chronic diastolic heart failure:  Continue dialysis in the setting of volume overload.    4.  End-stage renal disease with anasarca: Issue is complicated by clotted AV graft.  Has a right femoral dialysis catheter, access is challenging.  Presence of old HERO catheter on left.  Discussed with Dr. Juleen China.  5.  Shock: Multifactorial, sepsis, status post arrest, diastolic dysfunction decompensation, she is currently on stress dose steroids, weaning off.    Currently off of pressors.   6.  Super morbid obesity: This issue adds extreme complexity to her management  7.  Protocols: As above.  8.  Pressure injury skin: Early skin breakdown despite positioning and specialized bed, patient's massive obesity complicates this issue.    Prognosis remains exceedingly guarded.  Will have palliative care evaluate patient.  This will help with  establishing goals of care.  We  will continue to address the same with the family.   LOS: 20 days   Additional comments: Multidisciplinary rounds were performed with ICU team.  Critical Care Total Time*: 40 Minutes  C. Derrill Kay, MD [facility] 05/18/2019  *Care during the described time interval was provided by me and/or other providers on the critical care team.  I have reviewed this patient's available data, including medical history, events of note, physical examination and test results as part of my evaluation.

## 2019-05-18 NOTE — Progress Notes (Signed)
CRRT successfully restarted

## 2019-05-18 NOTE — Progress Notes (Signed)
CRRT clotted at 0238, unable to flush back.

## 2019-05-18 NOTE — Progress Notes (Signed)
PHARMACY CONSULT NOTE - FOLLOW UP  Pharmacy Consult for Electrolyte Monitoring and Replacement   Recent Labs: Potassium (mmol/L)  Date Value  05/18/2019 4.5  03/31/2015 4.2   Magnesium (mg/dL)  Date Value  05/18/2019 1.9  06/14/2014 1.8   Calcium (mg/dL)  Date Value  05/18/2019 8.0 (L)   Calcium, Total (mg/dL)  Date Value  03/31/2015 7.0 (LL)   Albumin (g/dL)  Date Value  05/18/2019 2.4 (L)  03/31/2015 2.3 (L)   Phosphorus (mg/dL)  Date Value  05/18/2019 2.8  03/31/2015 3.6   Sodium (mmol/L)  Date Value  05/18/2019 138  03/31/2015 129 (L)   Corrected Calcium 9.28  Assessment:63 y.o.femaleadmittedon 5/26/2020with pneumonia. Severe ESRD, 18 years of dialysis. CRRT discontinued on 6/15. Plan is to resume regular Tuesday/Thrusday/Saturday dialysis. Patient is back on dialysis in setting of volume overload.  Goal of Therapy:  Potassium ~ 4, magnesium ~ 2  Plan:  No electrolyte replacement warranted at this time.   Will complete consult at this time. Please re-consult pharmacy if further involvement desired.   Ned Clines, PharmD Candidate 05/18/2019 1:01 PM

## 2019-05-18 NOTE — Progress Notes (Signed)
Dr. Esther Hardy at bedside assessing patient and has given verbal order to turn CRFRt off.

## 2019-05-18 NOTE — Progress Notes (Signed)
CRRT stopped per MD order

## 2019-05-19 DIAGNOSIS — J9602 Acute respiratory failure with hypercapnia: Secondary | ICD-10-CM

## 2019-05-19 DIAGNOSIS — L89002 Pressure ulcer of unspecified elbow, stage 2: Secondary | ICD-10-CM

## 2019-05-19 DIAGNOSIS — T8249XA Other complication of vascular dialysis catheter, initial encounter: Secondary | ICD-10-CM

## 2019-05-19 LAB — CBC
HCT: 23.7 % — ABNORMAL LOW (ref 36.0–46.0)
Hemoglobin: 7.1 g/dL — ABNORMAL LOW (ref 12.0–15.0)
MCH: 30.6 pg (ref 26.0–34.0)
MCHC: 30 g/dL (ref 30.0–36.0)
MCV: 102.2 fL — ABNORMAL HIGH (ref 80.0–100.0)
Platelets: 242 10*3/uL (ref 150–400)
RBC: 2.32 MIL/uL — ABNORMAL LOW (ref 3.87–5.11)
RDW: 22.4 % — ABNORMAL HIGH (ref 11.5–15.5)
WBC: 9.4 10*3/uL (ref 4.0–10.5)
nRBC: 3.3 % — ABNORMAL HIGH (ref 0.0–0.2)

## 2019-05-19 LAB — GLUCOSE, CAPILLARY
Glucose-Capillary: 102 mg/dL — ABNORMAL HIGH (ref 70–99)
Glucose-Capillary: 108 mg/dL — ABNORMAL HIGH (ref 70–99)
Glucose-Capillary: 121 mg/dL — ABNORMAL HIGH (ref 70–99)
Glucose-Capillary: 135 mg/dL — ABNORMAL HIGH (ref 70–99)
Glucose-Capillary: 143 mg/dL — ABNORMAL HIGH (ref 70–99)
Glucose-Capillary: 149 mg/dL — ABNORMAL HIGH (ref 70–99)

## 2019-05-19 LAB — RENAL FUNCTION PANEL
Albumin: 2.3 g/dL — ABNORMAL LOW (ref 3.5–5.0)
Anion gap: 10 (ref 5–15)
BUN: 32 mg/dL — ABNORMAL HIGH (ref 8–23)
CO2: 23 mmol/L (ref 22–32)
Calcium: 7.8 mg/dL — ABNORMAL LOW (ref 8.9–10.3)
Chloride: 104 mmol/L (ref 98–111)
Creatinine, Ser: 3.26 mg/dL — ABNORMAL HIGH (ref 0.44–1.00)
GFR calc Af Amer: 17 mL/min — ABNORMAL LOW (ref >60)
GFR calc non Af Amer: 14 mL/min — ABNORMAL LOW (ref >60)
Glucose, Bld: 119 mg/dL — ABNORMAL HIGH (ref 70–99)
Phosphorus: 2.7 mg/dL (ref 2.5–4.6)
Potassium: 3.8 mmol/L (ref 3.5–5.1)
Sodium: 137 mmol/L (ref 135–145)

## 2019-05-19 LAB — MAGNESIUM: Magnesium: 1.9 mg/dL (ref 1.7–2.4)

## 2019-05-19 MED ORDER — MIDODRINE HCL 5 MG PO TABS
10.0000 mg | ORAL_TABLET | ORAL | Status: DC
  Administered 2019-05-19: 10 mg via ORAL
  Filled 2019-05-19: qty 2

## 2019-05-19 MED ORDER — NOREPINEPHRINE BITARTRATE 1 MG/ML IV SOLN
0.0000 ug/min | INTRAVENOUS | Status: DC
  Administered 2019-05-19: 20:00:00 5 ug/min via INTRAVENOUS
  Filled 2019-05-19: qty 8

## 2019-05-19 NOTE — Progress Notes (Signed)
[facility]  ROUNDING NOTE   Subjective:   Scheduled for intermittent hemodialysis later today. Spoke to Husband, [name] over the phone.   Objective:  Vital signs in last 24 hours:  Temp:  [98.4 F (36.9 C)-98.8 F (37.1 C)] 98.8 F (37.1 C) (06/16 0756) Pulse Rate:  [75-100] 75 (06/16 1000) Resp:  [0-27] 26 (06/16 1000) BP: (91-154)/(36-103) 96/52 (06/16 0900) SpO2:  [90 %-97 %] 95 % (06/16 1000) FiO2 (%):  [50 %] 50 % (06/16 0814) Weight:  [147 kg] 147 kg (06/16 0430)  Weight change: -2.268 kg Filed Weights   05/17/19 0500 05/18/19 0500 05/19/19 0430  Weight: (!) 148.3 kg (!) 149.2 kg (!) 147 kg    Intake/Output: I/O last 3 completed shifts: In: 1799.3 [I.V.:1421.9; NG/GT:377.3] Out: 768 [Other:768]   Intake/Output this shift:  Total I/O In: 317.7 [I.V.:57.7; NG/GT:260] Out: -   Physical Exam: General: Critically ill   Head: ETT  Eyes: Anicteric   Neck: obese  Lungs:  Clear to auscultation, PRVC 50%  Heart: Regular rate and rhythm  Abdomen:  Soft, nontender, obese  Extremities: + peripheral edema.  Neurologic: Intubated and sedated  Skin: No lesions  Access: Right femoral temp HD catheter, clotted left arm AVG    Basic Metabolic Panel: Recent Labs  Lab 05/17/19 1820 05/17/19 2130 05/18/19 0115 05/18/19 0530 05/19/19 0412  NA 138 138 138 138 137  K 4.5 4.3 4.4 4.5 3.8  CL 102 101 103 103 104  CO2 22 26 25 24 23   GLUCOSE 111* 124* 122* 128* 119*  BUN 38* 33* 29* 26* 32*  CREATININE 3.11* 2.76* 2.50* 2.40* 3.26*  CALCIUM 8.2* 8.1* 8.0* 8.0* 7.8*  MG 1.9 2.0 1.8 1.9 1.9  PHOS 3.4 2.9 2.8 2.8 2.7    Liver Function Tests: Recent Labs  Lab 05/17/19 1820 05/17/19 2130 05/18/19 0115 05/18/19 0530 05/19/19 0412  ALBUMIN 2.5* 2.5* 2.5* 2.4* 2.3*   No results for input(s): LIPASE, AMYLASE in the last 168 hours. No results for input(s): AMMONIA in the last 168 hours.  CBC: Recent Labs  Lab 05/14/19 0429 05/15/19 0326  05/16/19 0555 05/16/19 1910 05/17/19 0628 05/18/19 0530  WBC 11.7* 15.8* 12.8* 17.0* 15.5* 10.7*  NEUTROABS 9.5* 11.7* 9.7*  --  12.1*  --   HGB 7.9* 7.1* 6.9* 8.4* 8.3* 7.3*  HCT 24.8* 22.4* 22.8* 27.5* 27.2* 23.8*  MCV 93.9 94.9 98.7 100.0 98.6 99.2  PLT 132* 145* 155 157 172 156    Cardiac Enzymes: No results for input(s): CKTOTAL, CKMB, CKMBINDEX, TROPONINI in the last 168 hours.  BNP: Invalid input(s): POCBNP  CBG: Recent Labs  Lab 05/18/19 1608 05/18/19 1930 05/18/19 2317 05/19/19 0353 05/19/19 0724  GLUCAP 82 120* 98 102* 121*    Microbiology: Results for orders placed or performed during the hospital encounter of 04/15/2019  SARS Coronavirus 2 (CEPHEID - Performed in [place] hospital lab), Hosp Order     Status: None   Collection Time: 05/02/2019  7:31 AM   Specimen: Nasopharyngeal Swab  Result Value Ref Range Status   SARS Coronavirus 2 NEGATIVE NEGATIVE Final    Comment: (NOTE) If result is NEGATIVE SARS-CoV-2 target nucleic acids are NOT DETECTED. The SARS-CoV-2 RNA is generally detectable in upper and lower  respiratory specimens during the acute phase of infection. The lowest  concentration of SARS-CoV-2 viral copies this assay can detect is 250  copies / mL. A negative result does not preclude SARS-CoV-2 infection  and should not be used  as the sole basis for treatment or other  patient management decisions.  A negative result may occur with  improper specimen collection / handling, submission of specimen other  than nasopharyngeal swab, presence of viral mutation(s) within the  areas targeted by this assay, and inadequate number of viral copies  (<250 copies / mL). A negative result must be combined with clinical  observations, patient history, and epidemiological information. If result is POSITIVE SARS-CoV-2 target nucleic acids are DETECTED. The SARS-CoV-2 RNA is generally detectable in upper and lower  respiratory specimens dur ing the acute  phase of infection.  Positive  results are indicative of active infection with SARS-CoV-2.  Clinical  correlation with patient history and other diagnostic information is  necessary to determine patient infection status.  Positive results do  not rule out bacterial infection or co-infection with other viruses. If result is PRESUMPTIVE POSTIVE SARS-CoV-2 nucleic acids MAY BE PRESENT.   A presumptive positive result was obtained on the submitted specimen  and confirmed on repeat testing.  While 2019 novel coronavirus  (SARS-CoV-2) nucleic acids may be present in the submitted sample  additional confirmatory testing may be necessary for epidemiological  and / or clinical management purposes  to differentiate between  SARS-CoV-2 and other Sarbecovirus currently known to infect humans.  If clinically indicated additional testing with an alternate test  methodology [phone]) is advised. The SARS-CoV-2 RNA is generally  detectable in upper and lower respiratory sp ecimens during the acute  phase of infection. The expected result is Negative. Fact Sheet for Patients:  StrictlyIdeas.no Fact Sheet for Healthcare Providers: BankingDealers.co.za This test is not yet approved or cleared by the [place] FDA and has been authorized for detection and/or diagnosis of SARS-CoV-2 by FDA under an Emergency Use Authorization (EUA).  This EUA will remain in effect (meaning this test can be used) for the duration of the COVID-19 declaration under Section 564(b)(1) of the Act, 21 U.S.C. section 360bbb-3(b)(1), unless the authorization is terminated or revoked sooner. Performed at [facility], [address] [place], [place] [address]   MRSA PCR Screening     Status: None   Collection Time: 04/27/2019  9:04 AM   Specimen: Nasal Mucosa; Nasopharyngeal  Result Value Ref Range Status   MRSA by PCR NEGATIVE NEGATIVE Final    Comment:        The  GeneXpert MRSA Assay (FDA approved for NASAL specimens only), is one component of a comprehensive MRSA colonization surveillance program. It is not intended to diagnose MRSA infection nor to guide or monitor treatment for MRSA infections. Performed at [facility], [address] [place], [place] [address]   Culture, respiratory (non-expectorated)     Status: None   Collection Time: 05/01/19 11:05 AM   Specimen: Tracheal Aspirate; Respiratory  Result Value Ref Range Status   Specimen Description   Final    TRACHEAL ASPIRATE Performed at [facility], [address], [place], [place] [address]    Special Requests   Final    NONE Performed at [facility], [address] [place], [place] [address]    Gram Stain   Final    MODERATE WBC PRESENT, PREDOMINANTLY PMN RARE YEAST Performed at [facility], [place] [address] [place],  [address]    Culture FEW CANDIDA ALBICANS FEW CANDIDA TROPICALIS   Final   Report Status 05/04/2019 FINAL  Final  Culture, respiratory     Status: None   Collection Time: 05/09/19  6:30 PM   Specimen: INDUCED SPUTUM  Result Value Ref Range Status   Specimen Description   Final    INDUCED SPUTUM Performed at [facility], [address] [place], [place] [address]    Special Requests   Final    NONE Performed at [facility], [address] [place], [place] [address]    Gram Stain   Final    MODERATE WBC PRESENT, PREDOMINANTLY PMN FEW GRAM NEGATIVE RODS Performed at [facility], [place] [address] [place], [place] [address]    Culture MODERATE PSEUDOMONAS AERUGINOSA  Final   Report Status 05/13/2019 FINAL  Final   Organism ID, Bacteria PSEUDOMONAS AERUGINOSA  Final      Susceptibility   Pseudomonas aeruginosa - MIC*    CEFTAZIDIME 2 SENSITIVE Sensitive     CIPROFLOXACIN <=0.25 SENSITIVE Sensitive     GENTAMICIN <=1 SENSITIVE Sensitive     IMIPENEM 2 SENSITIVE Sensitive      PIP/TAZO <=4 SENSITIVE Sensitive     CEFEPIME <=1 SENSITIVE Sensitive     * MODERATE PSEUDOMONAS AERUGINOSA    Coagulation Studies: No results for input(s): LABPROT, INR in the last 72 hours.  Urinalysis: No results for input(s): COLORURINE, LABSPEC, PHURINE, GLUCOSEU, HGBUR, BILIRUBINUR, KETONESUR, PROTEINUR, UROBILINOGEN, NITRITE, LEUKOCYTESUR in the last 72 hours.  Invalid input(s): APPERANCEUR    Imaging: Dg Abd 1 View  Result Date: 05/18/2019 CLINICAL DATA:  Nasogastric placement. EXAM: ABDOMEN - 1 VIEW COMPARISON:  06/02/2019 FINDINGS: Nasogastric tube enters the stomach in has its tip in the fundus proximal body. IMPRESSION: Nasogastric tube tip in the stomach in the fundus or proximal body. Electronically Signed   By: Nelson Chimes M.D.   On: 05/18/2019 11:41     Medications:   . sodium chloride 200 mL (05/10/19 1200)  . dextrose Stopped (05/18/19 2159)  . fentaNYL infusion INTRAVENOUS 275 mcg/hr (05/19/19 0800)   . allopurinol  150 mg Per Tube Once per day on Tue Thu Sat  . amitriptyline  25 mg Per Tube QHS  . atorvastatin  40 mg Per Tube Daily  . budesonide (PULMICORT) nebulizer solution  0.5 mg Nebulization BID  . chlorhexidine  15 mL Mouth/Throat BID  . Chlorhexidine Gluconate Cloth  6 each Topical Daily  . citalopram  20 mg Per Tube Daily  . clopidogrel  75 mg Per Tube Daily  . clotrimazole  1 Applicatorful Vaginal QHS  . epoetin (EPOGEN/PROCRIT) injection  10,000 Units Intravenous Q T,Th,Sa-HD  . famotidine  20 mg Per Tube QHS  . feeding supplement (PRO-STAT SUGAR FREE 64)  30 mL Per Tube 5 X Daily  . feeding supplement (VITAL HIGH PROTEIN)  1,000 mL Per Tube Q24H  . free water  30 mL Per Tube Q4H  . heparin injection (subcutaneous)  5,000 Units Subcutaneous Q8H  . insulin aspart  0-15 Units Subcutaneous Q4H  . insulin aspart  4 Units Subcutaneous Q4H  . insulin glargine  5 Units Subcutaneous BID  . ipratropium-albuterol  3 mL Nebulization Q4H  . mouth rinse   15 mL Mouth Rinse 10 times per day  . multivitamin  15 mL Per Tube Daily  . polyethylene glycol  17 g Oral BID  . senna-docusate  1 tablet Per Tube BID  . sodium chloride flush  10-40 mL Intracatheter Q12H   albuterol, fentaNYL, fentaNYL (SUBLIMAZE) injection, lidocaine-prilocaine, LORazepam, midazolam, nystatin, polyethylene glycol, polyvinyl alcohol, sodium chloride flush, vecuronium  Assessment/ Plan:  Ms. Symantha Steeber Pinkstaff is a 63  y.o. black female with end stage renal disease on hemodialysis, hypertension, CVA, obstructive sleep apnea, peripheral vascular disease, hypertension, gout, GERD, diabetes mellitus type II, COPD, coronary artery disease, congestive heart failure, whowas admitted to [facility] on 5[facility]26/2020for pneumonia. Acute respiratory failure requiring mechanical ventilation and vasopressors. Placed on CRRT. Did not tolerate intermittent hemodialysis.  [name] left AVG   1. End stage renal disease: scheduled for dialysis today. However family meeting later today. If patient is going towards comfort care, we will hold dialysis.  Off vasopressors.   2. Anemia of chronic kidney disease: hemoglobin 7.3 - EPO with HD treatment Carolyn  3. Hypotension: off vasopressors.  - restart midodrine before dialysis treatments.   4. Acute respiratory failure with cardiopulmonary arrest. Extubated 6/2, re-intubated 6/5 Per ENT patient will require transfer to [facility] for placement of tracheostomy.   LOS: [place] 6/16/202010:36 AM

## 2019-05-19 NOTE — Progress Notes (Signed)
Pre HD Assessment    05/19/19 1945  Neurological  Level of Consciousness Responds to Voice  Orientation Level Intubated/Tracheostomy - Unable to assess  Respiratory  Respiratory Pattern Regular  Bilateral Breath Sounds Diminished;Rhonchi  Cardiac  Pulse Regular  Heart Sounds S1, S2;No adventitious heart sounds  Jugular Venous Distention (JVD) No  Cardiac Rhythm NSR  Heart Block Type 1st degree AVB  Vascular  R Radial Pulse +2  L Radial Pulse +2  Edema Right upper extremity;Left upper extremity;Right lower extremity;Left lower extremity  RUE Edema +3  LUE Edema +3  RLE Edema +3  LLE Edema +3  Integumentary  Integumentary (WDL) X  Skin Color Appropriate for ethnicity  Skin Condition Dry  Skin Integrity Blister  Ecchymosis Location Abdomen  Ecchymosis Location Orientation Anterior  Musculoskeletal  Musculoskeletal (WDL) X  Generalized Weakness Yes  Gastrointestinal  Bowel Sounds Assessment Active;Faint  NG/OG Tube Orogastric Right mouth Documented cm marking at nare/ corner of mouth 63 cm  Placement Date/Time: 05/18/19 1115   Tube Type: Orogastric  Tube Location: Right mouth  Technique Used to Measure Tube Placement: Documented cm marking at nare/ corner of mouth  Initial cm Marking at [employer] of Mouth (if applicable): 63 cm  Cm Marking at Nare/Corner of Mouth (if applicable)  (Tube feed paused while pt being connected,to access CVC)  GU Assessment  Genitourinary (WDL) X  Genitourinary Symptoms Anuria (HD PT)  Psychosocial  Psychosocial (WDL) X  Patient Behaviors Not interactive  Emotional support given Given to patient

## 2019-05-19 NOTE — Progress Notes (Signed)
HD Tx End  2kg fluid removal. BP sustained throughout tx with the help of levo.    05/19/19 2300  Hand-Off documentation  Report given to (Full Name) Merri Brunette RN  Report received from (Full Name) Trellis Paganini RN  Vital Signs  Pulse Rate 70  Pulse Rate Source Monitor  Resp (!) 22  BP (!) 108/49  BP Location Right Wrist  BP Method Automatic  Patient Position (if appropriate) Lying  Oxygen Therapy  SpO2 97 %  O2 Device Ventilator  End [place] CO2 (EtCO2) 31  Pain Assessment  Pain Scale CPOT  Critical Care Pain Observation Tool (CPOT)  Facial Expression 0  Body Movements 0  Muscle Tension 0  Compliance with ventilator (intubated pts.) 0  Vocalization (extubated pts.) N/A  CPOT Total 0  During Hemodialysis Assessment  Blood Flow Rate (mL/min) 200 mL/min  Arterial Pressure (mmHg) -100 mmHg  Venous Pressure (mmHg) 120 mmHg  Transmembrane Pressure (mmHg) 60 mmHg  Ultrafiltration Rate (mL/min) 870 mL/min  Dialysate Flow Rate (mL/min) 600 ml/min  Conductivity: Machine  14  HD Safety Checks Performed Yes  Dialysis Fluid Bolus Normal Saline  Bolus Amount (mL) 250 mL  Intra-Hemodialysis Comments Tx completed;Tolerated well  Post-Hemodialysis Assessment  Rinseback Volume (mL) 250 mL  Dialyzer Clearance Lightly streaked  Duration of HD Treatment -hour(s) 3 hour(s)  Hemodialysis Intake (mL) 500 mL  UF Total -Machine (mL) 2500 mL  Net UF (mL) 2000 mL  Tolerated HD Treatment Yes  Hemodialysis Catheter Right Femoral vein Double lumen Permanent  Placement Date/Time: 05/13/2019 1700   Placed prior to admission: No  Time Out: Correct patient;Correct site;Correct procedure  Maximum sterile barrier precautions: Hand hygiene;Sterile gloves;Cap;Large sterile sheet;Mask;Sterile gown  Site Prep: Chlorh...  Site Condition No complications  Blue Lumen Status Flushed;Capped (Central line);Heparin locked  Red Lumen Status Flushed;Capped (Central line);Heparin locked  Purple Lumen Status  Infusing  Catheter fill solution Heparin 1000 units/ml  Catheter fill volume (Arterial) 1.8 cc  Catheter fill volume (Venous) 1.8  Dressing Type Occlusive;Biopatch  Dressing Status Clean;Dry;Intact  Drainage Description None  Post treatment catheter status Capped and Clamped

## 2019-05-19 NOTE — Progress Notes (Signed)
Post HD Assessment 2kg fluid removal. BP sustained throughout tx with the help of levo.   05/19/19 2318  Neurological  Level of Consciousness Responds to Voice  Orientation Level Intubated/Tracheostomy - Unable to assess  Respiratory  Respiratory Pattern Regular  Chest Assessment Chest expansion symmetrical  Bilateral Breath Sounds Diminished;Rhonchi  Cardiac  Pulse Regular  Heart Sounds S1, S2;No adventitious heart sounds  Jugular Venous Distention (JVD) No  Cardiac Rhythm NSR  Heart Block Type 1st degree AVB  Vascular  R Radial Pulse +2  L Radial Pulse +2  Edema Right upper extremity;Left upper extremity;Right lower extremity;Left lower extremity  RUE Edema +3  LUE Edema +3  RLE Edema +3  LLE Edema +3  Integumentary  Integumentary (WDL) X  Skin Color Appropriate for ethnicity  Skin Condition Dry  Skin Integrity Blister  Ecchymosis Location Abdomen  Ecchymosis Location Orientation Anterior  Musculoskeletal  Musculoskeletal (WDL) X  Generalized Weakness Yes  Gastrointestinal  Bowel Sounds Assessment Active;Faint  GU Assessment  Genitourinary (WDL) X  Genitourinary Symptoms Anuria (HD PT)  Psychosocial  Psychosocial (WDL) X  Patient Behaviors Not interactive  Emotional support given Given to patient

## 2019-05-19 NOTE — Progress Notes (Signed)
Follow up - Critical Care Medicine Note  Patient Details:    [name] is an 63 y.o. female.morbidly obese,with severe end stage renal disease s/p cardiac arrest and hypothermia protocol with recurrent resp failure from acute diastolic heart failure and aspiration pneumonia  Lines, Airways, Drains: Airway 8 mm (Active)  Secured at (cm) 24 cm 05/19/2019  4:00 PM  Measured From Lips 05/18/2019  4:00 PM  Secured Location Right 06/01/2019  4:00 PM  Secured By [employer] 05/07/2019  4:00 PM  Tube Holder Repositioned Yes 06/01/2019  1:45 PM  Cuff Pressure (cm H2O) 26 cm H2O 05/28/2019  1:45 PM  Site Condition Cool;Dry 05/25/2019  4:00 PM     NG/OG Tube Orogastric Center mouth Xray (Active)  Cm Marking at Nare/Corner of Mouth (if applicable) 57 cm 07/07/4626  4:00 PM  Site Assessment Clean;Dry;Intact 05/11/2019  4:00 PM  Ongoing Placement Verification No change in cm markings or external length of tube from initial placement;No acute changes, not attributed to clinical condition 05/19/2019  4:00 PM  Status Infusing tube feed 05/28/2019  4:00 PM  Intake (mL) 35 mL 05/07/2019  5:03 PM    Anti-infectives:  Anti-infectives (From admission, onward)   Start     Dose/Rate Route Frequency Ordered Stop   05/15/19 2200  cefTAZidime (FORTAZ) 2 g in sodium chloride 0.9 % 100 mL IVPB  Status:  Discontinued     2 g 200 mL/hr over 30 Minutes Intravenous Every 12 hours 05/15/19 1500 05/18/19 1531   05/14/19 1800  cefTAZidime (FORTAZ) 1 g in sodium chloride 0.9 % 100 mL IVPB  Status:  Discontinued     1 g 200 mL/hr over 30 Minutes Intravenous Every 24 hours 05/14/19 1601 05/15/19 1500   05/10/19 0600  piperacillin-tazobactam (ZOSYN) IVPB 3.375 g  Status:  Discontinued     3.375 g 12.5 mL/hr over 240 Minutes Intravenous Every 8 hours 05/09/19 2136 05/14/19 1009   05/09/19 2100  piperacillin-tazobactam (ZOSYN) IVPB 3.375 g  Status:  Discontinued     3.375 g 12.5 mL/hr over 240 Minutes Intravenous Every 12  hours 05/09/19 1835 05/09/19 1937   05/09/19 2000  piperacillin-tazobactam (ZOSYN) IVPB 3.375 g  Status:  Discontinued     3.375 g 12.5 mL/hr over 240 Minutes Intravenous Every 12 hours 05/09/19 1937 05/09/19 2136   05/04/2019 0215  ceFAZolin (ANCEF) IVPB 1 g/50 mL premix     1 g 100 mL/hr over 30 Minutes Intravenous  Once 05/12/2019 0210 05/15/2019 0404   05/01/19 1200  azithromycin (ZITHROMAX) 500 mg in sodium chloride 0.9 % 250 mL IVPB  Status:  Discontinued     500 mg 250 mL/hr over 60 Minutes Intravenous Every 24 hours 05/01/19 0940 05/05/19 0915   05/01/19 1000  doxycycline (VIBRAMYCIN) 100 mg in sodium chloride 0.9 % 250 mL IVPB  Status:  Discontinued     100 mg 125 mL/hr over 120 Minutes Intravenous Every 12 hours 05/01/19 0940 05/05/19 0915   04/30/19 2200  ceFEPIme (MAXIPIME) 2 g in sodium chloride 0.9 % 100 mL IVPB  Status:  Discontinued     2 g 200 mL/hr over 30 Minutes Intravenous Every 24 hours 04/30/19 0708 04/30/19 1431   04/30/19 2200  ceFEPIme (MAXIPIME) 2 g in sodium chloride 0.9 % 100 mL IVPB  Status:  Discontinued     2 g 200 mL/hr over 30 Minutes Intravenous Every 12 hours 04/30/19 1431 05/01/19 0940   04/29/19 1800  ceFEPIme (MAXIPIME) 1 g in sodium  chloride 0.9 % 100 mL IVPB  Status:  Discontinued     1 g 200 mL/hr over 30 Minutes Intravenous Every 24 hours 04/06/2019 1156 04/04/2019 1340   04/29/19 1200  vancomycin (VANCOCIN) IVPB 1000 mg/200 mL premix  Status:  Discontinued     1,000 mg 200 mL/hr over 60 Minutes Intravenous Every 24 hours 05/01/2019 1340 04/16/2019 1853   04/27/2019 2200  ceFEPIme (MAXIPIME) 2 g in sodium chloride 0.9 % 100 mL IVPB  Status:  Discontinued     2 g 200 mL/hr over 30 Minutes Intravenous Every 12 hours 04/13/2019 1340 04/30/19 0708   04/27/2019 1200  vancomycin (VANCOCIN) IVPB 1000 mg/200 mL premix  Status:  Discontinued     1,000 mg 200 mL/hr over 60 Minutes Intravenous  Once 04/13/2019 1153 04/12/2019 1853   04/27/2019 0830  vancomycin (VANCOCIN) IVPB  1000 mg/200 mL premix     1,000 mg 200 mL/hr over 60 Minutes Intravenous  Once 04/08/2019 0820 04/12/2019 1200   05/01/2019 0830  ceFEPIme (MAXIPIME) 2 g in sodium chloride 0.9 % 100 mL IVPB     2 g 200 mL/hr over 30 Minutes Intravenous  Once 04/11/2019 0820 04/06/2019 1022      Microbiology: Results for orders placed or performed during the hospital encounter of 04/20/2019  SARS Coronavirus 2 (CEPHEID - Performed in [place] hospital lab), Hosp Order     Status: None   Collection Time: 04/21/2019  7:31 AM   Specimen: Nasopharyngeal Swab  Result Value Ref Range Status   SARS Coronavirus 2 NEGATIVE NEGATIVE Final    Comment: (NOTE) If result is NEGATIVE SARS-CoV-2 target nucleic acids are NOT DETECTED. The SARS-CoV-2 RNA is generally detectable in upper and lower  respiratory specimens during the acute phase of infection. The lowest  concentration of SARS-CoV-2 viral copies this assay can detect is 250  copies / mL. A negative result does not preclude SARS-CoV-2 infection  and should not be used as the sole basis for treatment or other  patient management decisions.  A negative result may occur with  improper specimen collection / handling, submission of specimen other  than nasopharyngeal swab, presence of viral mutation(s) within the  areas targeted by this assay, and inadequate number of viral copies  (<250 copies / mL). A negative result must be combined with clinical  observations, patient history, and epidemiological information. If result is POSITIVE SARS-CoV-2 target nucleic acids are DETECTED. The SARS-CoV-2 RNA is generally detectable in upper and lower  respiratory specimens dur ing the acute phase of infection.  Positive  results are indicative of active infection with SARS-CoV-2.  Clinical  correlation with patient history and other diagnostic information is  necessary to determine patient infection status.  Positive results do  not rule out bacterial infection or co-infection  with other viruses. If result is PRESUMPTIVE POSTIVE SARS-CoV-2 nucleic acids MAY BE PRESENT.   A presumptive positive result was obtained on the submitted specimen  and confirmed on repeat testing.  While 2019 novel coronavirus  (SARS-CoV-2) nucleic acids may be present in the submitted sample  additional confirmatory testing may be necessary for epidemiological  and / or clinical management purposes  to differentiate between  SARS-CoV-2 and other Sarbecovirus currently known to infect humans.  If clinically indicated additional testing with an alternate test  methodology [phone]) is advised. The SARS-CoV-2 RNA is generally  detectable in upper and lower respiratory sp ecimens during the acute  phase of infection. The expected result is Negative. Fact  Sheet for Patients:  StrictlyIdeas.no Fact Sheet for Healthcare Providers: BankingDealers.co.za This test is not yet approved or cleared by the [place] FDA and has been authorized for detection and/or diagnosis of SARS-CoV-2 by FDA under an Emergency Use Authorization (EUA).  This EUA will remain in effect (meaning this test can be used) for the duration of the COVID-19 declaration under Section 564(b)(1) of the Act, 21 U.S.C. section 360bbb-3(b)(1), unless the authorization is terminated or revoked sooner. Performed at [facility], [address] [place], [place] [address]   MRSA PCR Screening     Status: None   Collection Time: 04/25/2019  9:04 AM   Specimen: Nasal Mucosa; Nasopharyngeal  Result Value Ref Range Status   MRSA by PCR NEGATIVE NEGATIVE Final    Comment:        The GeneXpert MRSA Assay (FDA approved for NASAL specimens only), is one component of a comprehensive MRSA colonization surveillance program. It is not intended to diagnose MRSA infection nor to guide or monitor treatment for MRSA infections. Performed at [facility], [address] [place], [place] [address]   Culture, respiratory (non-expectorated)     Status: None   Collection Time: 05/01/19 11:05 AM   Specimen: Tracheal Aspirate; Respiratory  Result Value Ref Range Status   Specimen Description   Final    TRACHEAL ASPIRATE Performed at [facility], [address] [place], [place] [address]    Special Requests   Final    NONE Performed at [facility], [address] [place], [place] [address]    Gram Stain   Final    MODERATE WBC PRESENT, PREDOMINANTLY PMN RARE YEAST Performed at [facility], [place] [address] [place], [place] [address]    Culture FEW CANDIDA ALBICANS FEW CANDIDA TROPICALIS   Final   Report Status 05/04/2019 FINAL  Final  Culture, respiratory     Status: None   Collection Time: 05/09/19  6:30 PM   Specimen: INDUCED SPUTUM  Result Value Ref Range Status   Specimen Description   Final    INDUCED SPUTUM Performed at [facility], [address] [place], [place] [address]    Special Requests   Final    NONE Performed at [facility], [address], [place], [place] [address]    Gram Stain   Final    MODERATE WBC PRESENT, PREDOMINANTLY PMN FEW GRAM NEGATIVE RODS Performed at [facility], [place] [address] [place], [place] [address]    Culture MODERATE PSEUDOMONAS AERUGINOSA  Final   Report Status 05/30/2019 FINAL  Final   Organism ID, Bacteria PSEUDOMONAS AERUGINOSA  Final      Susceptibility   Pseudomonas aeruginosa - MIC*    CEFTAZIDIME 2 SENSITIVE Sensitive     CIPROFLOXACIN <=0.25 SENSITIVE Sensitive     GENTAMICIN <=1 SENSITIVE Sensitive     IMIPENEM 2 SENSITIVE Sensitive     PIP/TAZO <=4 SENSITIVE Sensitive     CEFEPIME <=1 SENSITIVE Sensitive     * MODERATE PSEUDOMONAS AERUGINOSA    Best Practice/Protocols:  VTE Prophylaxis: Heparin (drip) GI Prophylaxis: Antihistamine   Continous Sedation Hyperglycemia (ICU)  Events: 5/26 ICU admission for resp failure  and cardiac arrest 5/26 hypothermia protocol 6/3 successfully extubated 6/5 re-admitted to the ICU for aspiration pneumonia and progressive diastolic heart failure and renal failure 6/6 plan for [facility], ENT consulted, remains critically ill on vent 6/6 near cardiac arrest, fio2 100%, PEEP 15 6/7 severe resp failure,  multiorgan failure 6/9 declined for trach due to high FiO2 requirements  Studies: Dg Abd 1 View  Result Date: 05/18/2019 CLINICAL DATA:  Nasogastric placement. EXAM: ABDOMEN - 1 VIEW COMPARISON:  05/11/2019 FINDINGS: Nasogastric tube enters the stomach in has its tip in the fundus proximal body. IMPRESSION: Nasogastric tube tip in the stomach in the fundus or proximal body. Electronically Signed   By: Nelson Chimes M.D.   On: 05/18/2019 11:41   Dg Abd 1 View  Result Date: 05/16/2019 CLINICAL DATA:  OG tube placement. EXAM: ABDOMEN - 1 VIEW COMPARISON:  05/03/2019. FINDINGS: OG tube noted with tip below left hemidiaphragm. Severely distended loops of bowel are noted. Abdominal series suggested for further evaluation. No definite free air noted. IMPRESSION: NG tube noted with its tip under the left hemidiaphragm. Severely distended loops of bowel are noted. Abdominal series suggested for further evaluation. Electronically Signed   By: Marcello Moores  Register   On: 05/09/2019 15:50   Dg Chest Port 1 View  Result Date: 05/15/2019 CLINICAL DATA:  Acute respiratory failure EXAM: PORTABLE CHEST 1 VIEW COMPARISON:  05/13/2019 FINDINGS: Cardiac shadow is stable. Endotracheal tube, gastric catheter and hero graft are again noted and stable. Mild vascular congestion is seen with small bilateral pleural effusions and bibasilar atelectasis. The overall appearance is roughly stable from the prior exam. IMPRESSION: Stable vascular congestion with effusions and atelectasis. Tubes and lines as described. Electronically Signed   By: Inez Catalina M.D.   On: 05/15/2019 08:11   Dg Chest Port 1 View  Result  Date: 05/13/2019 CLINICAL DATA:  Respiratory failure EXAM: PORTABLE CHEST 1 VIEW COMPARISON:  05/10/2019 FINDINGS: Endotracheal tube and nasogastric catheter are noted in satisfactory position. Hero graft is again seen and unchanged. Cardiac shadow is stable. The overall inspiratory effort is poor with elevation of the right hemidiaphragm. Small effusions are noted bilaterally. Mild bibasilar atelectasis is again seen. No new focal abnormality is noted. IMPRESSION: Stable appearance of the chest with small effusions and bibasilar atelectasis. Electronically Signed   By: Inez Catalina M.D.   On: 05/13/2019 07:22   Dg Chest Port 1 View  Result Date: 05/10/2019 CLINICAL DATA:  Acute respiratory failure. EXAM: PORTABLE CHEST 1 VIEW COMPARISON:  Chest radiograph 05/09/2019 FINDINGS: ET tube terminates in the mid trachea. Enteric tube courses inferior to the diaphragm. Central venous catheter tip projects over the superior vena cava. Stable cardiomegaly. Elevation right hemidiaphragm. Similar-appearing bilateral mid lower lung heterogeneous opacities. Probable small left pleural effusion. IMPRESSION: Similar-appearing mid and lower lung airspace opacities. Possible small left pleural effusion. Electronically Signed   By: Lovey Newcomer M.D.   On: 05/10/2019 06:47   Dg Chest Port 1 View  Result Date: 05/09/2019 CLINICAL DATA:  Congestive heart failure.  Hypoxia.  Ex-smoker. EXAM: PORTABLE CHEST 1 VIEW COMPARISON:  05/09/2019 at 0552 hours. FINDINGS: 1817 hours. Large bore left-sided central line terminates at the high SVC. Endotracheal tube is difficult to visualize centrally but likely terminates 2.9 cm above carina. Nasogastric tube extends beyond the inferior aspect of the film. Mildly degraded exam due to AP portable technique and patient body habitus. Normal heart size for level of inspiration. Right costophrenic angle minimally excluded. moderate right hemidiaphragm elevation with extremely low lung volumes. No  overt congestive heart failure. Suspect residual right base airspace disease. IMPRESSION: Cardiomegaly and extremely low lung volumes. Probable residual right base airspace disease/atelectasis. Electronically Signed   By: Abigail Miyamoto M.D.   On: 05/09/2019 18:59   Dg Chest [facility]  1 View  Result Date: 05/09/2019 CLINICAL DATA:  Acute respiratory failure EXAM: PORTABLE CHEST 1 VIEW COMPARISON:  05/05/2019 FINDINGS: Endotracheal tube tip is just above the level of the carina. Retraction by 2-3 cm would place it at the level of the clavicular heads. Enteric tube side port projects over the stomach. Left IJ large bore central venous catheter is unchanged. Bibasilar atelectasis. IMPRESSION: Endotracheal tube tip just above the level of the carina. Retraction by 2-3 cm is recommended. Electronically Signed   By: Ulyses Jarred M.D.   On: 05/09/2019 06:21   Dg Chest Port 1 View  Result Date: 05/29/2019 CLINICAL DATA:  Intubation EXAM: PORTABLE CHEST 1 VIEW COMPARISON:  Portable exam 1454 hours compared to 05/07/2019 FINDINGS: Tip of endotracheal tube projects 2.1 cm above carina. Nasogastric tube extends into abdomen. Rotated to the RIGHT. LEFT jugular large-bore central venous catheter with tip projecting over SVC. Enlargement of cardiac silhouette with pulmonary vascular congestion. Probable pulmonary edema and layered RIGHT pleural effusion IMPRESSION: Tip of endotracheal tube projects 2.1 cm above carina. Question pulmonary edema and layering RIGHT pleural effusion. Electronically Signed   By: Lavonia Dana M.D.   On: 06/01/2019 15:49   Dg Chest Port 1 View  Result Date: 05/07/2019 CLINICAL DATA:  Shortness of breath. EXAM: PORTABLE CHEST 1 VIEW COMPARISON:  Radiographs of May 06, 2019. FINDINGS: Stable cardiomediastinal silhouette. Left internal jugular catheter is unchanged. No pneumothorax or significant pleural effusion is noted. Minimal bibasilar subsegmental atelectasis is noted. Bony thorax is unremarkable.  IMPRESSION: Minimal bibasilar subsegmental atelectasis. Electronically Signed   By: Marijo Conception M.D.   On: 05/07/2019 09:55   Dg Chest Port 1 View  Result Date: 05/06/2019 CLINICAL DATA:  Shortness of breath EXAM: PORTABLE CHEST 1 VIEW COMPARISON:  Two days ago FINDINGS: Cardiomegaly and vascular pedicle widening. Very low volume chest with hazy opacity on both sides. Dialysis catheter from the left with tip near the SVC origin. Trachea and esophagus have been extubated. IMPRESSION: Unchanged very low lung volumes with atelectasis and vascular congestion. There may be layering pleural effusions. Electronically Signed   By: Monte Fantasia M.D.   On: 05/06/2019 05:31   Dg Chest Port 1 View  Result Date: 05/04/2019 CLINICAL DATA:  Acute respiratory failure EXAM: PORTABLE CHEST 1 VIEW COMPARISON:  05/01/2019 FINDINGS: Endotracheal tube, gastric catheter and hero graft are again noted and stable. Cardiac shadow is stable. Right upper lobe atelectasis is noted along the minor fissure new from the prior exam. Some improved aeration in right base is noted when compare with the prior exam. Mild vascular congestion is noted. Small left effusion is noted. IMPRESSION: Improved aeration in the right base although some right upper lobe atelectasis is noted. Mild vascular congestion. Electronically Signed   By: Inez Catalina M.D.   On: 05/04/2019 07:17   Dg Chest Port 1 View  Result Date: 05/01/2019 CLINICAL DATA:  Shortness of breath. EXAM: PORTABLE CHEST 1 VIEW COMPARISON:  Chest x-ray from same day at 3:54 a.m. FINDINGS: The patient remains rotated to the right. Unchanged endotracheal and enteric tubes. Unchanged left HeRO graft. Stable cardiomegaly. Normal pulmonary vascularity. Chronic elevation of the right hemidiaphragm with right basilar atelectasis/scarring. Unchanged hazy opacity at the left lung base likely reflecting a combination of atelectasis and small left pleural effusion. No pneumothorax. No acute  osseous abnormality. IMPRESSION: 1. Stable chest with bibasilar atelectasis and probable small left pleural effusion. Electronically Signed   By: Titus Dubin M.D.   On:  05/01/2019 10:20   Dg Chest Port 1 View  Result Date: 05/01/2019 CLINICAL DATA:  Acute respiratory failure. EXAM: PORTABLE CHEST 1 VIEW COMPARISON:  04/29/2019. FINDINGS: Endotracheal tube, NG tube, large caliber left subclavian line in stable position. Stable cardiomegaly. Bilateral pulmonary infiltrates/edema slightly progressed from prior exam. Bibasilar atelectasis particular prominent right lung base. Bilateral pleural effusions cannot be excluded. No pneumothorax. IMPRESSION: 1.  Lines and tubes stable position. 2. Cardiomegaly unchanged. Diffuse bilateral pulmonary infiltrates/edema slightly progressed from prior exam. Bibasilar atelectasis particular prominent the right lung base. Bilateral pleural effusions cannot be excluded. Electronically Signed   By: Marcello Moores  Register   On: 05/01/2019 06:15   Dg Chest Port 1 View  Result Date: 04/29/2019 CLINICAL DATA:  Acute respiratory failure. EXAM: PORTABLE CHEST 1 VIEW COMPARISON:  Single-view of the chest earlier today and 04/27/2019. FINDINGS: Support tubes and lines are unchanged since the most recent comparison. There is cardiomegaly without edema. Bibasilar airspace disease has worsened on the right. No pneumothorax. There may be a right pleural effusion. IMPRESSION: No change in support apparatus. Bibasilar airspace disease is worse on the right. There may be a right pleural effusion. Cardiomegaly. Electronically Signed   By: Inge Rise M.D.   On: 04/29/2019 18:32   Dg Chest Port 1 View  Result Date: 04/29/2019 CLINICAL DATA:  Acute respiratory failure EXAM: PORTABLE CHEST 1 VIEW COMPARISON:  Yesterday FINDINGS: Endotracheal tube tip between the clavicular heads and carina. Left subclavian dialysis catheter with tip at the brachiocephalic SVC confluence. There is an  orogastric tube which at least reaches the stomach. Haziness of the bilateral lower chest with obscuration of the left diaphragm. No Kerley lines, effusion, or pneumothorax. Cardiomegaly.  Rightward rotation. IMPRESSION: 1. Stable hardware positioning. 2. Worsening lower lobe aeration. Electronically Signed   By: Monte Fantasia M.D.   On: 04/29/2019 06:50   Dg Chest Port 1 View  Result Date: 04/29/2019 CLINICAL DATA:  63 year old female history intubation EXAM: PORTABLE CHEST 1 VIEW COMPARISON:  04/09/2019, 03/28/2015 FINDINGS: Cardiomediastinal silhouette unchanged in size and contour with low lung volumes accentuating the diameter of the mediastinum. Asymmetric elevation of the right hemidiaphragm. Coarsened interstitial markings with interlobular septal thickening and thickening of the minor fissure. Patchy opacity in the right lung base again demonstrated. Interval placement of endotracheal tube which terminates 2.5 cm above the carina. Interval placement of gastric tube terminating out of the field of view. Interval placement of defibrillator pads. Left upper extremity HERO graft, with the tip terminating in the region of the superior vena cava. IMPRESSION: Interval placement of endotracheal tube terminating 2.5 cm above the carina. Interval placement of gastric tube terminating in the abdomen out of the field of view. Interval placement of defibrillator pads. Similar appearance of low lung volumes, with background changes of either chronic scarring and/or early pulmonary edema, with similar appearance airspace disease at the right lung base. Left upper extremity HeRO graft Electronically Signed   By: Corrie Mckusick D.O.   On: 04/21/2019 13:18   Dg Chest Portable 1 View  Result Date: 04/27/2019 CLINICAL DATA:  Short of breath EXAM: PORTABLE CHEST 1 VIEW COMPARISON:  03/28/2015 FINDINGS: New left upper extremity and jugular hair 0 catheter with its tip in the upper SVC. Cardiomegaly. Normal vascularity.  Bibasilar opacities likely combination of volume loss and pleural fluid. No pneumothorax. Triangular opacity at the medial right lung base is noted representing either consolidation in the right lower lobe or collapse. IMPRESSION: Right lower lobe collapse versus consolidation.  Bibasilar opacities likely combination of pleural fluid and atelectasis. Electronically Signed   By: Marybelle Killings M.D.   On: 04/27/2019 08:04   Dg Abd Portable 1v  Result Date: 04/07/2019 CLINICAL DATA:  OG tube placement. EXAM: PORTABLE ABDOMEN - 1 VIEW COMPARISON:  One-view chest x-ray FINDINGS: OG tube terminates in the stomach. The stomach is mildly distended. The lung bases are clear. IMPRESSION: 1. OG tube terminates in the stomach. Electronically Signed   By: San Morelle M.D.   On: 04/14/2019 13:29    Consults: Treatment Team:  Algernon Huxley, MD [facility], Ander Gaster, MD Beverly Gust, MD   Subjective:    Overnight Issues: Remains sedated and on the ventilator.  FiO2 requirements down to 50%.  Ongoing discussions with patient's husband with regards to palliation.  Understanding is that the patient did not want tracheostomy ever again.  Palliative care engaged in discussion.  Objective:  Vital signs for last 24 hours: Temp:  [98.4 F (36.9 C)-98.8 F (37.1 C)] 98.4 F (36.9 C) (06/16 1600) Pulse Rate:  [75-100] 88 (06/16 1600) Resp:  [0-27] 12 (06/16 1600) BP: (91-152)/(36-103) 143/55 (06/16 1600) SpO2:  [91 %-97 %] 91 % (06/16 1600) FiO2 (%):  [50 %] 50 % (06/16 1600) Weight:  [147 kg] 147 kg (06/16 0430)  Hemodynamic parameters for last 24 hours:    Intake/Output from previous day: 06/15 0701 - 06/16 0700 In: 1345.3 [I.V.:968; NG/GT:377.3] Out: 73   Intake/Output this shift: Total I/O In: 924.4 [I.V.:279; NG/GT:645.3] Out: -   Vent settings for last 24 hours: Vent Mode: PRVC FiO2 (%):  [50 %] 50 % Set Rate:  [26 bmp] 26 bmp Vt Set:  [480 mL] 480 mL PEEP:  [10 cmH20] 10  cmH20  Physical Exam:  GENERAL: Massively obese, intubated, sedated, opens eyes and tracks.  Follows simple commands HEAD: Normocephalic, atraumatic.  EYES: Pupils equal, round, reactive to light. No scleral icterus.  MOUTH: Moist membranes. NECK: Very thick neck, supple.  Trachea midline, no crepitus.  PULMONARY: Coarse breath sounds throughout, no wheezing, or rales. CARDIOVASCULAR: S1 and S2. Regular rate and rhythm. No murmurs, rubs, or gallops.  GASTROINTESTINAL: Soft, protuberant.Positive bowel sounds.  MUSCULOSKELETAL: Anasarca 3+ evident, no clubbing, no joint deformity.   NEUROLOGIC: Sedated but arousable,  SKIN:intact,warm,dry stage 2 skin breakdown, sacral noted by nursing 6/13.  Assessment/Plan:  1.  Acute on chronic hypoxic/hypercarbic respiratory failure: Continue ventilator support.  She has continued high FiO2 requirements down to 50% today.  Does not tolerate further titration.  She has been turned down for tracheostomy due to this, challenging anatomy in the setting of massive obesity, prior tracheostomy with scarring and known increase venous pressure due to old HERO catheter.  Continue supportive care.  Discussions with family and palliative care.  Advocate transitioning to comfort care.  2.  Aspiration pneumonia: Continue antibiotics.  Has completed antibiotic therapy  3.  Acute on chronic diastolic heart failure:  Continue dialysis in the setting of volume overload.    4.  End-stage renal disease with anasarca: Issue is complicated by clotted AV graft.  Has a right femoral dialysis catheter, access is challenging.  Presence of old HERO catheter on left.  Discussed with Dr. Juleen China.  Dr. Juleen China is advocating discontinuing dialysis and transitioning the patient to comfort care.  PCCM in agreement.  5.  Shock: Multifactorial, sepsis, status post arrest, diastolic dysfunction decompensation, as of steroids being weaned off.  Off of pressors.  6.  Super morbid obesity:  This issue  adds extreme complexity to her management  7.  Protocols: As above.  8.  Pressure injury skin: Early skin breakdown despite positioning and specialized bed, patient's massive obesity complicates this issue.  Prognosis remains exceedingly guarded.  Additive care assisting with end-of-life discussions and to establish goals of care.  Highly recommend transitioning to comfort care.   LOS: 21 days   Additional comments: Multidisciplinary rounds were performed with ICU team.  Critical Care Total Time*: 35 minutes  C. Derrill Kay, MD [facility] 05/19/2019  *Care during the described time interval was provided by me and/or other providers on the critical care team.  I have reviewed this patient's available data, including medical history, events of note, physical examination and test results as part of my evaluation.

## 2019-05-19 NOTE — Progress Notes (Signed)
[facility] NAME: [name]    MR#:  [id number]  DATE OF BIRTH:  03-03-1956     Patient   Still on the ventilator.  CRRT done Saturday 50% FiO2.  PEEP 10 husband refused tracheostomy  palliative care consulted REVIEW OF SYSTEMS:   Unable to obtain due to patient being intubated and sedated  DRUG ALLERGIES:   Allergies  Allergen Reactions  . Iodine Rash  . Enalapril Other (See Comments)    TONGUE SWELLS/GOUT  . Iodinated Diagnostic Agents Other (See Comments)    BREAK OUT IN WHELPS      VITALS:  Blood pressure (!) 143/55, pulse 88, temperature 98.4 F (36.9 C), temperature source Axillary, resp. rate 12, height 5' 5.98" (1.676 m), weight (!) 147 kg, SpO2 91 %.  PHYSICAL EXAMINATION:  GENERAL:  63 y.o.-morbidly obese and critically ill-appearing. EYES: Pupils equal, round, reactive to light  No scleral icterus.  HEENT: Head atraumatic, normocephalic.  Currently intubated, sedated. ETT in place. NECK:  Supple, no jugular venous distention. No thyroid enlargement, no tenderness.  LUNGS: +diffuse rhonchi and wheezing present CARDIOVASCULAR: RRR, S1, S2 normal. No murmurs, rubs, or gallops.  ABDOMEN: Soft, nontender, nondistended. Bowel sounds present.  EXTREMITIES: +pedal edema and bilateral hand edema, no cyanosis or clubbing.  NEUROLOGIC: Unable to do neuro exam because of intubation, sedation. PSYCHIATRIC: Intubated, sedated.   SKIN: No obvious rash, lesion, or ulcer.   LABORATORY PANEL:   CBC Recent Labs  Lab 05/18/19 0530  WBC 10.7*  HGB 7.3*  HCT 23.8*  PLT 156   ------------------------------------------------------------------------------------------------------------------  Chemistries  Recent Labs  Lab 05/19/19 0412  NA 137  K 3.8  CL 104  CO2 23  GLUCOSE 119*  BUN 32*  CREATININE 3.26*  CALCIUM 7.8*  MG 1.9    ------------------------------------------------------------------------------------------------------------------  Cardiac Enzymes No results for input(s): TROPONINI in the last 168 hours. ------------------------------------------------------------------------------------------------------------------  RADIOLOGY:  Dg Abd 1 View  Result Date: 05/18/2019 CLINICAL DATA:  Nasogastric placement. EXAM: ABDOMEN - 1 VIEW COMPARISON:  05/28/2019 FINDINGS: Nasogastric tube enters the stomach in has its tip in the fundus proximal body. IMPRESSION: Nasogastric tube tip in the stomach in the fundus or proximal body. Electronically Signed   By: Nelson Chimes M.D.   On: 05/18/2019 11:41    EKG:   Orders placed or performed during the hospital encounter of 04/23/2019  . EKG 12-Lead  . EKG 12-Lead  . EKG 12-Lead  . EKG 12-Lead    ASSESSMENT AND PLAN:   63 year old female patient with history of CVA, obstructive sleep apnea on CPAP, ESRD on hemodialysis Tuesday, Thursday, Saturday, congestive heart failure, COPD comes in because of worsening shortness of breath, she was called in the emergency room, had PEA, intubated, admitted to intensive care unit.  Acute hypoxic and hypercapneic respiratory failure- status post PEA arrest. Also with aspiration pneumonia -Extubated 6/2 and then had to be reintubated 6/5 -Vent management per CCM .  PRVC 50% discussed with intensivist -Plan was for trach, but patient unable to obtain trach due to severe hypoxia, husband refused tracheostomy -Zosyn changed to ceftazidime -Sputum cultures growing pseudomonas  Shock- multi-factorial due to sepsis/hypovolemia/cardiogenic -Off pressors.  Wean off for map 65.  Failed weaning trials multiple times -Stress dose steroids -Continue midodrine tid -Abx as above   Failure to thrive Palliative care is involved and discussed with husband.  Has been refused tracheostomy and changed her CODE STATUS to DO NOT  RESUSCITATE  ESRD- on hemodialysis TTS. Temporary HD cath placed 6/5. -Nephrology following - CRRT done 05/16/2019.  Nephrology is planning to transferred her to hemodialysis Tuesday Thursday Saturday  Type 2 diabetes- blood sugars well controlled -Lantus and SSI  Hyperlipidemia- stable -Continue Lipitor  History of stroke -Continue Plavix  COPD- stable, no signs of acute exacerbation. -Continue nebs  Anemia in chronic kidney disease- hemoglobin low but stable. -EPO per nephrology  Failure to thrive palliative care is involved.  Husband refused tracheostomy.  No compressions and no shock  Extremely poor prognosis with high mortality   All the records are reviewed and case discussed with Care Management/Social Workerr. Management plans discussed with the patients daughter,   CODE STATUS: Partial code  TOTAL TIME TAKING CARE OF THIS PATIENT: 28 minutes.   Nicholes Mango M.D on 05/19/2019 at 4:18 PM  Between 7am to 6pm - Pager - [phone]  After 6pm go to [email or web address] - password [email or web address] [facility]  Office  [phone]  CC: Primary care physician; Smithson, [name], MD   Note: This dictation was prepared with Dragon dictation along with smaller phrase technology. Any transcriptional errors that result from this process are unintentional.

## 2019-05-19 NOTE — Progress Notes (Signed)
HD Tx Start  Hypotensive pre tx, ICU RN started pt on levo. Monitoring BP closely.     05/19/19 1950  Hand-Off documentation  Report given to (Full Name) Trellis Paganini RN  Report received from (Full Name) Debara Pickett RN  Vital Signs  Temp 98.6 F (37 C)  Temp Source Axillary  Pulse Rate 82  Pulse Rate Source Monitor  Resp (!) 7  BP (!) 73/37  BP Location Right Wrist  BP Method Automatic  Patient Position (if appropriate) Lying  Oxygen Therapy  SpO2 94 %  O2 Device Ventilator  End Tidal CO2 (EtCO2) 27  Pain Assessment  Pain Scale CPOT  Critical Care Pain Observation Tool (CPOT)  Facial Expression 0  Body Movements 0  Muscle Tension 0  Compliance with ventilator (intubated pts.) 0  Vocalization (extubated pts.) N/A  CPOT Total 0  Dialysis Weight  Weight (!) 147 kg  Type of Weight Pre-Dialysis  Time-Out for Hemodialysis  What Procedure? Hemodialysis  Pt Identifiers(min of two) First/Last Name;MRN/Account#  Correct Site? Yes  Correct Side? Yes  Correct Procedure? Yes  Consents Verified? Yes  Rad Studies Available? N/A  Safety Precautions Reviewed? Yes  Engineer, civil (consulting) Number 3  Station Number  (ICU1)  UF/Alarm Test Passed  Conductivity: Meter 14  Conductivity: Machine  13.8  pH 7.2  Reverse Osmosis WRO4  Normal Saline Lot Number [id number]  Dialyzer Lot Number [id number]  Disposable Set Lot Number [id number]  Machine Temperature 98.6 F (37 C)  Musician and Audible Yes  Blood Lines Intact and Secured Yes  Pre Treatment Patient Checks  Vascular access used during treatment Catheter  HD catheter dressing before treatment WDL  Hepatitis B Surface Antigen Results Negative  Date Hepatitis B Surface Antigen Drawn 05/02/19  Hepatitis B Surface Antibody 7 (<10)  Date Hepatitis B Surface Antibody Drawn 05/02/19  Hemodialysis Consent Verified Yes  Hemodialysis Standing Orders Initiated Yes  ECG (Telemetry) Monitor On Yes  Prime Ordered Normal  Saline  Length of  DialysisTreatment -hour(s) 3 Hour(s)  Dialysis Treatment Comments Na140  Dialyzer Elisio 17H NR  Dialysate 3K;2.5 Ca  Dialysate Flow Ordered 600  Blood Flow Rate Ordered 400 mL/min  Ultrafiltration Goal 2 Liters  Dialysis Blood Pressure Support Ordered Normal Saline  During Hemodialysis Assessment  Blood Flow Rate (mL/min) 400 mL/min  Arterial Pressure (mmHg) -160 mmHg  Venous Pressure (mmHg) 170 mmHg  Transmembrane Pressure (mmHg) 60 mmHg  Ultrafiltration Rate (mL/min) 870 mL/min (870 mL per HOUR removal of fluid)  Dialysate Flow Rate (mL/min) 600 ml/min  Conductivity: Machine  13.8  HD Safety Checks Performed Yes  Dialysis Fluid Bolus Normal Saline  Bolus Amount (mL) 250 mL  Intra-Hemodialysis Comments Tx initiated  Education / Care Plan  Dialysis Education Provided No (Comment)  Documented Education in Care Plan  (unable to educate. intubated)  Fistula / Graft Left Upper arm Arteriovenous fistula  No Placement Date or Time found.   Orientation: Left  Access Location: Upper arm  Access Type: Arteriovenous fistula  Removal Reason: (c)   Site Condition No complications  Fistula / Graft Assessment Absent ;Thrill;Bruit  Status Deaccessed  Drainage Description None  Hemodialysis Catheter Right Femoral vein Double lumen Permanent  Placement Date/Time: 05/18/2019 1700   Placed prior to admission: No  Time Out: Correct patient;Correct site;Correct procedure  Maximum sterile barrier precautions: Hand hygiene;Sterile gloves;Cap;Large sterile sheet;Mask;Sterile gown  Site Prep: Chlorh...  Site Condition No complications  Blue Lumen Status Infusing  Red Lumen Status  Infusing  Purple Lumen Status Infusing  Dressing Type Occlusive;Biopatch  Dressing Status Clean;Dry;Intact

## 2019-05-19 NOTE — Progress Notes (Signed)
Palliative:   [name] is lying quietly in bed.  She is intubated/ventilated and sedated.  She will open her eyes but not make eye contact.  She has 3rd spacing in her arms and legs. There is no family at bedside at this time due to visitor restrictions.  Conference with CCM attending, nephrology, and bedside nursing staff related to patient condition, needs, conference with husband [name] later today.   Call to husband [name] at [phone].  We talk about [name], vent support and HD.  [name] tells me, "If she can get a little stronger, get fluid off, she may do better".  We talk about [name] and concerns, prognosis for return of functional status. [name] shares that [name] would want to have a trach.  When I share that is the hole in her throat that we talked about yesterday, He tells me that he would "rather she have trach than be buried".  We talk about what [name] wants and not what we want for her.  We also talk about PEG.  [name] tells me, "I don't want her to be laying around having bed sores". "I'm going to have to let her go".  [name] then requests that I speak to daughter [name].  [name] shares that he is uncertain about how to care for [name].  He wants her to have recovery, but in concerned that she will "just be laying around".   [name] tells me that [name] is still in hospital Room 205, requests we talk with her about [name].   Face to face visit with daughter [name].  We talk about [name]'s acute and chronic health Wang in detail.  We talk about the ventilator and history of trach at age 63. At first [name] states that we should "Do everything" to keep [name] alive, that was what she wanted.  We talk about what doing everything looks like and feels like.  [name] is tearful, sharing that she is thinking with her heart.  I encourage her to talk with family, pray about how to care for her mother.  [name] shares that her preference is that [name]'s body decide,  she does not want for her or her father to have to "pull the plug".  I reassure [name] that regardless of choice we will continue to care for [name].  We talk about difficulty with trach, [name] cath, need to transfer.    We talk about what gives [name] pleasure in life now.  [name] smiles and shares that family is important to her mother, "she will fight for others but not herself". She shares her mothers love of cooking. We talk about how to care for others including 1) keeping them at the center, not what we want for them, 2) are we doing something TO her or FOR her, (can we change what is happening), 3) hearing [name]'s voice about how to care for her. [name] tells me that she will reach out to family for discussion.   PMT to follow up for decision re: trach 6/17 Conference with CCM for update.    55 minutes, extended time  Carolyn Axe, NP Palliative Medicine Team Team Phone # [phone] Greater than 50% of this time was spent counseling and coordinating care related to the above assessment and plan.

## 2019-05-20 ENCOUNTER — Inpatient Hospital Stay: Payer: Medicare Other

## 2019-05-20 DIAGNOSIS — J989 Respiratory disorder, unspecified: Secondary | ICD-10-CM

## 2019-05-20 DIAGNOSIS — E668 Other obesity: Secondary | ICD-10-CM

## 2019-05-20 LAB — CBC
HCT: 25.7 % — ABNORMAL LOW (ref 36.0–46.0)
Hemoglobin: 7.7 g/dL — ABNORMAL LOW (ref 12.0–15.0)
MCH: 31 pg (ref 26.0–34.0)
MCHC: 30 g/dL (ref 30.0–36.0)
MCV: 103.6 fL — ABNORMAL HIGH (ref 80.0–100.0)
Platelets: 242 10*3/uL (ref 150–400)
RBC: 2.48 MIL/uL — ABNORMAL LOW (ref 3.87–5.11)
RDW: 22.2 % — ABNORMAL HIGH (ref 11.5–15.5)
WBC: 10.8 10*3/uL — ABNORMAL HIGH (ref 4.0–10.5)
nRBC: 3.4 % — ABNORMAL HIGH (ref 0.0–0.2)

## 2019-05-20 LAB — BASIC METABOLIC PANEL
Anion gap: 10 (ref 5–15)
BUN: 25 mg/dL — ABNORMAL HIGH (ref 8–23)
CO2: 27 mmol/L (ref 22–32)
Calcium: 7.7 mg/dL — ABNORMAL LOW (ref 8.9–10.3)
Chloride: 101 mmol/L (ref 98–111)
Creatinine, Ser: 2.68 mg/dL — ABNORMAL HIGH (ref 0.44–1.00)
GFR calc Af Amer: 21 mL/min — ABNORMAL LOW (ref >60)
GFR calc non Af Amer: 18 mL/min — ABNORMAL LOW (ref >60)
Glucose, Bld: 152 mg/dL — ABNORMAL HIGH (ref 70–99)
Potassium: 3.6 mmol/L (ref 3.5–5.1)
Sodium: 138 mmol/L (ref 135–145)

## 2019-05-20 LAB — GLUCOSE, CAPILLARY
Glucose-Capillary: 137 mg/dL — ABNORMAL HIGH (ref 70–99)
Glucose-Capillary: 143 mg/dL — ABNORMAL HIGH (ref 70–99)
Glucose-Capillary: 154 mg/dL — ABNORMAL HIGH (ref 70–99)

## 2019-05-20 MED ORDER — LORAZEPAM 2 MG/ML IJ SOLN
2.0000 mg | INTRAMUSCULAR | Status: DC | PRN
  Administered 2019-05-20: 14:00:00 4 mg via INTRAVENOUS

## 2019-05-20 MED ORDER — LORAZEPAM 2 MG/ML IJ SOLN
2.0000 mg | Freq: Once | INTRAMUSCULAR | Status: AC
  Administered 2019-05-20: 2 mg via INTRAVENOUS
  Filled 2019-05-20: qty 1

## 2019-05-20 MED ORDER — MORPHINE 100MG IN NS 100ML (1MG/ML) PREMIX INFUSION
5.0000 mg/h | INTRAVENOUS | Status: DC
  Administered 2019-05-20: 5 mg/h via INTRAVENOUS
  Filled 2019-05-20 (×2): qty 100

## 2019-05-25 ENCOUNTER — Ambulatory Visit: Payer: Medicare Other | Admitting: Podiatry

## 2019-05-26 ENCOUNTER — Telehealth: Payer: Self-pay | Admitting: Pulmonary Disease

## 2019-05-26 NOTE — Telephone Encounter (Signed)
Death certificate has been placed in LG's folder.

## 2019-05-28 NOTE — Telephone Encounter (Signed)
Death certificate completed, [facility] aware ready for pickup.

## 2019-06-03 NOTE — Plan of Care (Signed)
Patient is extubated to room air 

## 2019-06-03 NOTE — Discharge Summary (Addendum)
DEATH SUMMARY   Patient Details  Name: CHRYSTEL BAREFIELD MRN: [id number] DOB: 12-Jul-1956  Admission/Discharge Information   Admit Date:  2019/05/26  Date of Death:  06/17/2019  Time of Death:  March 22, 1551  Length of Stay: 2023/03/22  Referring Physician: Maryann Conners, MD   Reason(s) for Hospitalization  Shortness of Breath   Diagnoses  Preliminary cause of death:  Secondary Diagnoses (including complications and co-morbidities):  Active Problems:   Acute on chronic kidney failure (HCC)   Diastolic heart failure (HCC)   Acute and chronic respiratory failure with hypoxia (HCC)   Goals of care, counseling/discussion   Palliative care by specialist   DNR (do not resuscitate) discussion   Pressure injury of skin   Aspiration pneumonia    Acute on chronic diastolic CHF    Mechanical intubation    Acute encephalopathy secondary to hypoxia    End stage renal disease requiring hemodialysis    Clotted AV fistula    Severe hyperkalemia due to ESRD    PEA arrest secondary to acute hypoxic respiratory failure and hyperkalemia  Brief Hospital Course (including significant findings, care, treatment, and services provided and events leading to death)  Eleanore Junio Siler is a 63 y.o. year old female with severe end stage renal disease on hemodialysis who presented to [facility] ER on 05/26/2019 with acute on chronic hypoxic respiratory failure secondary to acute diastolic CHF in setting of missing hemodialysis sessions due to clotted av fistula and aspiration pneumonia.  Pt  subsequently cardiac arrested requiring mechanical intubation and hypothermic protocol initiated.  She was subsequently admitted to ICU for additional workup and treatment.  Pt successfully extubated 05/05/2019 and transferred to the medsurg unit 05/06/2019.  She required transfer back to ICU on Bipap 006/24/2020 due to acute on chronic hypoxic respiratory failure and acute encephalopathy.  However, she failed Bipap requiring reintubation on  006/01/2019.  Despite aggressive treatment pt required high FiO2 demands on the ventilator, and therefore would require a tracheostomy for survival.  Palliative Care team consulted to elicits goals of treatment on 05/18/2019, and pts husband stated the pt had a tracheostomy in 03-22-03 and stated at that time should would not want a tracheostomy again.  Pts husband decided to change pts code status to DNR on 05/18/2019 and decided to discuss comfort care measures only vs. tracheostomy placement with the rest of pts family prior to making a decision. On 06-17-2019 pts family decided to transition pt to comfort measures only. The pt expired on 2019-06-17 at 1552.    Pertinent Labs and Studies  Significant Diagnostic Studies Dg Abd 1 View  Result Date: 05/18/2019 CLINICAL DATA:  Nasogastric placement. EXAM: ABDOMEN - 1 VIEW COMPARISON:  06/01/2019 FINDINGS: Nasogastric tube enters the stomach in has its tip in the fundus proximal body. IMPRESSION: Nasogastric tube tip in the stomach in the fundus or proximal body. Electronically Signed   By: Nelson Chimes M.D.   On: 05/18/2019 11:41   Dg Abd 1 View  Result Date: 05/08/2019 CLINICAL DATA:  OG tube placement. EXAM: ABDOMEN - 1 VIEW COMPARISON:  2019/05/26. FINDINGS: OG tube noted with tip below left hemidiaphragm. Severely distended loops of bowel are noted. Abdominal series suggested for further evaluation. No definite free air noted. IMPRESSION: NG tube noted with its tip under the left hemidiaphragm. Severely distended loops of bowel are noted. Abdominal series suggested for further evaluation. Electronically Signed   By: Marcello Moores  Register   On: 05/08/2019 15:50   Dg Chest Port 1 [address]  Result Date: 06/08/19 CLINICAL DATA:  Hypoxia. EXAM: PORTABLE CHEST 1 VIEW COMPARISON:  05/15/2019 FINDINGS: Endotracheal tube has tip 3.8 cm above the carina. Enteric tube courses into the region of the stomach and tip is not visualized. Left IJ central venous catheter unchanged  with tip over the SVC obliquely oriented. Lungs are adequately inflated with slight worsening moderate basilar opacification likely effusions with associated atelectasis. Infection in the mid to lower lungs is possible. Possible additional component of vascular congestion. Remainder the exam is unchanged. IMPRESSION: Slight interval worsening bibasilar opacification likely effusions with associated atelectasis. Infection in the mid to lower lungs is possible. Likely additional component of vascular congestion. Tubes and lines as described. Electronically Signed   By: Marin Olp M.D.   On: June 08, 2019 08:10   Dg Chest Port 1 View  Result Date: 05/15/2019 CLINICAL DATA:  Acute respiratory failure EXAM: PORTABLE CHEST 1 VIEW COMPARISON:  05/13/2019 FINDINGS: Cardiac shadow is stable. Endotracheal tube, gastric catheter and hero graft are again noted and stable. Mild vascular congestion is seen with small bilateral pleural effusions and bibasilar atelectasis. The overall appearance is roughly stable from the prior exam. IMPRESSION: Stable vascular congestion with effusions and atelectasis. Tubes and lines as described. Electronically Signed   By: Inez Catalina M.D.   On: 05/15/2019 08:11   Dg Chest Port 1 View  Result Date: 05/13/2019 CLINICAL DATA:  Respiratory failure EXAM: PORTABLE CHEST 1 VIEW COMPARISON:  05/10/2019 FINDINGS: Endotracheal tube and nasogastric catheter are noted in satisfactory position. Hero graft is again seen and unchanged. Cardiac shadow is stable. The overall inspiratory effort is poor with elevation of the right hemidiaphragm. Small effusions are noted bilaterally. Mild bibasilar atelectasis is again seen. No new focal abnormality is noted. IMPRESSION: Stable appearance of the chest with small effusions and bibasilar atelectasis. Electronically Signed   By: Inez Catalina M.D.   On: 05/13/2019 07:22   Dg Chest Port 1 View  Result Date: 05/10/2019 CLINICAL DATA:  Acute respiratory  failure. EXAM: PORTABLE CHEST 1 VIEW COMPARISON:  Chest radiograph 05/09/2019 FINDINGS: ET tube terminates in the mid trachea. Enteric tube courses inferior to the diaphragm. Central venous catheter tip projects over the superior vena cava. Stable cardiomegaly. Elevation right hemidiaphragm. Similar-appearing bilateral mid lower lung heterogeneous opacities. Probable small left pleural effusion. IMPRESSION: Similar-appearing mid and lower lung airspace opacities. Possible small left pleural effusion. Electronically Signed   By: Lovey Newcomer M.D.   On: 05/10/2019 06:47   Dg Chest Port 1 View  Result Date: 05/09/2019 CLINICAL DATA:  Congestive heart failure.  Hypoxia.  Ex-smoker. EXAM: PORTABLE CHEST 1 VIEW COMPARISON:  05/09/2019 at 0552 hours. FINDINGS: 1817 hours. Large bore left-sided central line terminates at the high SVC. Endotracheal tube is difficult to visualize centrally but likely terminates 2.9 cm above carina. Nasogastric tube extends beyond the inferior aspect of the film. Mildly degraded exam due to AP portable technique and patient body habitus. Normal heart size for level of inspiration. Right costophrenic angle minimally excluded. moderate right hemidiaphragm elevation with extremely low lung volumes. No overt congestive heart failure. Suspect residual right base airspace disease. IMPRESSION: Cardiomegaly and extremely low lung volumes. Probable residual right base airspace disease/atelectasis. Electronically Signed   By: Abigail Miyamoto M.D.   On: 05/09/2019 18:59   Dg Chest Port 1 View  Result Date: 05/09/2019 CLINICAL DATA:  Acute respiratory failure EXAM: PORTABLE CHEST 1 VIEW COMPARISON:  05/09/2019 FINDINGS: Endotracheal tube tip is just above the level of the carina.  Retraction by 2-3 cm would place it at the level of the clavicular heads. Enteric tube side port projects over the stomach. Left IJ large bore central venous catheter is unchanged. Bibasilar atelectasis. IMPRESSION:  Endotracheal tube tip just above the level of the carina. Retraction by 2-3 cm is recommended. Electronically Signed   By: Ulyses Jarred M.D.   On: 05/09/2019 06:21   Dg Chest Port 1 View  Result Date: 05/09/2019 CLINICAL DATA:  Intubation EXAM: PORTABLE CHEST 1 VIEW COMPARISON:  Portable exam 1454 hours compared to 05/07/2019 FINDINGS: Tip of endotracheal tube projects 2.1 cm above carina. Nasogastric tube extends into abdomen. Rotated to the RIGHT. LEFT jugular large-bore central venous catheter with tip projecting over SVC. Enlargement of cardiac silhouette with pulmonary vascular congestion. Probable pulmonary edema and layered RIGHT pleural effusion IMPRESSION: Tip of endotracheal tube projects 2.1 cm above carina. Question pulmonary edema and layering RIGHT pleural effusion. Electronically Signed   By: Lavonia Dana M.D.   On: 05/21/2019 15:49   Dg Chest Port 1 View  Result Date: 05/07/2019 CLINICAL DATA:  Shortness of breath. EXAM: PORTABLE CHEST 1 VIEW COMPARISON:  Radiographs of May 06, 2019. FINDINGS: Stable cardiomediastinal silhouette. Left internal jugular catheter is unchanged. No pneumothorax or significant pleural effusion is noted. Minimal bibasilar subsegmental atelectasis is noted. Bony thorax is unremarkable. IMPRESSION: Minimal bibasilar subsegmental atelectasis. Electronically Signed   By: Marijo Conception M.D.   On: 05/07/2019 09:55   Dg Chest Port 1 View  Result Date: 05/06/2019 CLINICAL DATA:  Shortness of breath EXAM: PORTABLE CHEST 1 VIEW COMPARISON:  Two days ago FINDINGS: Cardiomegaly and vascular pedicle widening. Very low volume chest with hazy opacity on both sides. Dialysis catheter from the left with tip near the SVC origin. Trachea and esophagus have been extubated. IMPRESSION: Unchanged very low lung volumes with atelectasis and vascular congestion. There may be layering pleural effusions. Electronically Signed   By: Monte Fantasia M.D.   On: 05/06/2019 05:31   Dg Chest  Port 1 View  Result Date: 05/04/2019 CLINICAL DATA:  Acute respiratory failure EXAM: PORTABLE CHEST 1 VIEW COMPARISON:  05/01/2019 FINDINGS: Endotracheal tube, gastric catheter and hero graft are again noted and stable. Cardiac shadow is stable. Right upper lobe atelectasis is noted along the minor fissure new from the prior exam. Some improved aeration in right base is noted when compare with the prior exam. Mild vascular congestion is noted. Small left effusion is noted. IMPRESSION: Improved aeration in the right base although some right upper lobe atelectasis is noted. Mild vascular congestion. Electronically Signed   By: Inez Catalina M.D.   On: 05/04/2019 07:17   Dg Chest Port 1 View  Result Date: 05/01/2019 CLINICAL DATA:  Shortness of breath. EXAM: PORTABLE CHEST 1 VIEW COMPARISON:  Chest x-ray from same day at 3:54 a.m. FINDINGS: The patient remains rotated to the right. Unchanged endotracheal and enteric tubes. Unchanged left HeRO graft. Stable cardiomegaly. Normal pulmonary vascularity. Chronic elevation of the right hemidiaphragm with right basilar atelectasis/scarring. Unchanged hazy opacity at the left lung base likely reflecting a combination of atelectasis and small left pleural effusion. No pneumothorax. No acute osseous abnormality. IMPRESSION: 1. Stable chest with bibasilar atelectasis and probable small left pleural effusion. Electronically Signed   By: Titus Dubin M.D.   On: 05/01/2019 10:20   Dg Chest Port 1 View  Result Date: 05/01/2019 CLINICAL DATA:  Acute respiratory failure. EXAM: PORTABLE CHEST 1 VIEW COMPARISON:  04/29/2019. FINDINGS: Endotracheal tube, NG tube,  large caliber left subclavian line in stable position. Stable cardiomegaly. Bilateral pulmonary infiltrates/edema slightly progressed from prior exam. Bibasilar atelectasis particular prominent right lung base. Bilateral pleural effusions cannot be excluded. No pneumothorax. IMPRESSION: 1.  Lines and tubes stable  position. 2. Cardiomegaly unchanged. Diffuse bilateral pulmonary infiltrates/edema slightly progressed from prior exam. Bibasilar atelectasis particular prominent the right lung base. Bilateral pleural effusions cannot be excluded. Electronically Signed   By: Marcello Moores  Register   On: 05/01/2019 06:15   Dg Chest Port 1 View  Result Date: 04/29/2019 CLINICAL DATA:  Acute respiratory failure. EXAM: PORTABLE CHEST 1 VIEW COMPARISON:  Single-view of the chest earlier today and 04/18/2019. FINDINGS: Support tubes and lines are unchanged since the most recent comparison. There is cardiomegaly without edema. Bibasilar airspace disease has worsened on the right. No pneumothorax. There may be a right pleural effusion. IMPRESSION: No change in support apparatus. Bibasilar airspace disease is worse on the right. There may be a right pleural effusion. Cardiomegaly. Electronically Signed   By: Inge Rise M.D.   On: 04/29/2019 18:32   Dg Chest Port 1 View  Result Date: 04/29/2019 CLINICAL DATA:  Acute respiratory failure EXAM: PORTABLE CHEST 1 VIEW COMPARISON:  Yesterday FINDINGS: Endotracheal tube tip between the clavicular heads and carina. Left subclavian dialysis catheter with tip at the brachiocephalic SVC confluence. There is an orogastric tube which at least reaches the stomach. Haziness of the bilateral lower chest with obscuration of the left diaphragm. No Kerley lines, effusion, or pneumothorax. Cardiomegaly.  Rightward rotation. IMPRESSION: 1. Stable hardware positioning. 2. Worsening lower lobe aeration. Electronically Signed   By: Monte Fantasia M.D.   On: 04/29/2019 06:50   Dg Chest Port 1 View  Result Date: 04/26/2019 CLINICAL DATA:  63 year old female history intubation EXAM: PORTABLE CHEST 1 VIEW COMPARISON:  05/03/2019, 03/28/2015 FINDINGS: Cardiomediastinal silhouette unchanged in size and contour with low lung volumes accentuating the diameter of the mediastinum. Asymmetric elevation of the  right hemidiaphragm. Coarsened interstitial markings with interlobular septal thickening and thickening of the minor fissure. Patchy opacity in the right lung base again demonstrated. Interval placement of endotracheal tube which terminates 2.5 cm above the carina. Interval placement of gastric tube terminating out of the field of view. Interval placement of defibrillator pads. Left upper extremity HERO graft, with the tip terminating in the region of the superior vena cava. IMPRESSION: Interval placement of endotracheal tube terminating 2.5 cm above the carina. Interval placement of gastric tube terminating in the abdomen out of the field of view. Interval placement of defibrillator pads. Similar appearance of low lung volumes, with background changes of either chronic scarring and/or early pulmonary edema, with similar appearance airspace disease at the right lung base. Left upper extremity HeRO graft Electronically Signed   By: Corrie Mckusick D.O.   On: 04/30/2019 13:18   Dg Chest Portable 1 View  Result Date: 04/05/2019 CLINICAL DATA:  Short of breath EXAM: PORTABLE CHEST 1 VIEW COMPARISON:  03/28/2015 FINDINGS: New left upper extremity and jugular hair 0 catheter with its tip in the upper SVC. Cardiomegaly. Normal vascularity. Bibasilar opacities likely combination of volume loss and pleural fluid. No pneumothorax. Triangular opacity at the medial right lung base is noted representing either consolidation in the right lower lobe or collapse. IMPRESSION: Right lower lobe collapse versus consolidation. Bibasilar opacities likely combination of pleural fluid and atelectasis. Electronically Signed   By: Marybelle Killings M.D.   On: 04/27/2019 08:04   Dg Abd Portable 1v  Result Date:  04/17/2019 CLINICAL DATA:  OG tube placement. EXAM: PORTABLE ABDOMEN - 1 VIEW COMPARISON:  One-view chest x-ray FINDINGS: OG tube terminates in the stomach. The stomach is mildly distended. The lung bases are clear. IMPRESSION: 1. OG  tube terminates in the stomach. Electronically Signed   By: San Morelle M.D.   On: 04/11/2019 13:29    Microbiology No results found for this or any previous visit (from the past 240 hour(s)).  Lab Basic Metabolic Panel: Recent Labs  Lab 05/17/19 1820 05/17/19 2130 05/18/19 0115 05/18/19 0530 05/19/19 0412 2019/06/03 0430  NA 138 138 138 138 137 138  K 4.5 4.3 4.4 4.5 3.8 3.6  CL 102 101 103 103 104 101  CO2 22 26 25 24 23 27   GLUCOSE 111* 124* 122* 128* 119* 152*  BUN 38* 33* 29* 26* 32* 25*  CREATININE 3.11* 2.76* 2.50* 2.40* 3.26* 2.68*  CALCIUM 8.2* 8.1* 8.0* 8.0* 7.8* 7.7*  MG 1.9 2.0 1.8 1.9 1.9  --   PHOS 3.4 2.9 2.8 2.8 2.7  --    Liver Function Tests: Recent Labs  Lab 05/17/19 1820 05/17/19 2130 05/18/19 0115 05/18/19 0530 05/19/19 0412  ALBUMIN 2.5* 2.5* 2.5* 2.4* 2.3*   No results for input(s): LIPASE, AMYLASE in the last 168 hours. No results for input(s): AMMONIA in the last 168 hours. CBC: Recent Labs  Lab 05/14/19 0429 05/15/19 0326 05/16/19 0555 05/16/19 1910 05/17/19 0628 05/18/19 0530 05/19/19 1817 06-03-2019 0430  WBC 11.7* 15.8* 12.8* 17.0* 15.5* 10.7* 9.4 10.8*  NEUTROABS 9.5* 11.7* 9.7*  --  12.1*  --   --   --   HGB 7.9* 7.1* 6.9* 8.4* 8.3* 7.3* 7.1* 7.7*  HCT 24.8* 22.4* 22.8* 27.5* 27.2* 23.8* 23.7* 25.7*  MCV 93.9 94.9 98.7 100.0 98.6 99.2 102.2* 103.6*  PLT 132* 145* 155 157 172 156 242 242   Cardiac Enzymes: No results for input(s): CKTOTAL, CKMB, CKMBINDEX, TROPONINI in the last 168 hours. Sepsis Labs: Recent Labs  Lab 05/17/19 0628 05/18/19 0530 05/19/19 1817 Jun 03, 2019 0430  WBC 15.5* 10.7* 9.4 10.8*    Procedures/Operations  Mechanical Intubation x2 Right femoral temporary trialysis dialysis catheter placement x2 Continuous renal replacement therapy Hemodialysis   Marda Stalker, [place] Pager [phone] (please enter 7 digits) PCCM Consult Pager [phone] (please enter 7 digits)

## 2019-06-03 NOTE — Progress Notes (Signed)
Patient's family at bedside, in agreement with comfort care. Plan for terminal extubation this afternoon. Family does not wish to remain at bedside for extubation and terminal phase of care. RN to update family when patient's passes.

## 2019-06-03 NOTE — Plan of Care (Signed)
  Problem: Education: Goal: Knowledge of General Education information will improve Description: Including pain rating scale, medication(s)/side effects and non-pharmacologic comfort measures Outcome: Progressing   Problem: Activity: Goal: Risk for activity intolerance will decrease Outcome: Progressing   Problem: Nutrition: Goal: Adequate nutrition will be maintained Outcome: Progressing   Problem: Pain Managment: Goal: General experience of comfort will improve Outcome: Progressing   Problem: Safety: Goal: Ability to remain free from injury will improve Outcome: Progressing   Problem: Skin Integrity: Goal: Risk for impaired skin integrity will decrease Outcome: Progressing   Problem: Education: Goal: Knowledge of disease and its progression will improve Outcome: Progressing Goal: Individualized Educational Video(s) Outcome: Progressing   Problem: Fluid Volume: Goal: Compliance with measures to maintain balanced fluid volume will improve Outcome: Progressing   Problem: Health Behavior/Discharge Planning: Goal: Ability to manage health-related needs will improve Outcome: Progressing   Problem: Nutritional: Goal: Ability to make healthy dietary choices will improve Outcome: Progressing   Problem: Clinical Measurements: Goal: Complications related to the disease process, condition or treatment will be avoided or minimized Outcome: Progressing

## 2019-06-03 NOTE — Progress Notes (Signed)
Nutrition Follow-up  RD working remotely.  DOCUMENTATION CODES:   Morbid obesity  INTERVENTION:  Continue Vital High Protein at 40 mL/hr (960 mL goal daily volume) + Pro-Stat 30 mL 5 times daily per tube. Provides 1460 kcal, 159 grams of protein, 806 mL H2O daily.  If patient transitions to comfort care, recommend discontinuing tube feeds and related orders.  NUTRITION DIAGNOSIS:   Inadequate oral intake related to inability to eat as evidenced by NPO status.  Ongoing - addressing with TF regimen.  GOAL:   Provide needs based on ASPEN/SCCM guidelines  Met with TF regimen.  MONITOR:   Vent status, Labs, I & O's, Skin  REASON FOR ASSESSMENT:   Ventilator, Consult Enteral/tube feeding initiation and management  ASSESSMENT:   63 year old female with PMHx of DM, ESRD on HD, HTN, gout, hx CVA 2004, PVD, CHF, OSA, COPD, anxiety, GERD, arthritis, neuropathy, hx renal cell carcinoma s/p partial left nephrectomy, CAD admitted after cardiac arrest s/p ACLS and intubation on 5/26, with severe hyperkalemia.  Patient remains intubated and sedated. Patient on PRVC mode with FiO2 90% and PEP 10 cmH2O. Abdomen soft per RN documentation. Last BM 6/16. Patient did not tolerate hemodialysis last night. Family is considering transition to comfort care.  Enteral Access: OGT placed 6/15; terminates in stomach per abdominal x-ray 6/15  TF: pt tolerating Vital High Protein at 40 mL/hr + Pro-Stat 30 mL 5 times + FWF 30 mL Q4hrs  Patient is currently intubated on ventilator support Ve: 12.3 L/min Temp (24hrs), Avg:98.6 F (37 C), Min:98.4 F (36.9 C), Max:98.9 F (37.2 C)  Propofol: N/A  Medications reviewed and include: allopurinol, Epogen 10000 units with HD, famotidine, Novolog 0-15 units Q4hrs, Novolog 4 units Q4hrs, Lantus 5 units BID, midodrine, liquid MVI daily per tube, Miralax 17 grams BID, senna-docusate 1 tablet BID, fentanyl gtt, norpinephrine gtt.  Labs reviewed: CBG 137-154,  BUN 25, Creatinine 2.68.  Diet Order:   Diet Order            Diet NPO time specified  Diet effective midnight             EDUCATION NEEDS:   No education needs have been identified at this time  Skin:  Skin Assessment: Skin Integrity Issues:(MSAD to breasts and groin)  Last BM:  05/19/2019 per chart  Height:   Ht Readings from Last 1 Encounters:  05/17/19 5' 5.98" (1.676 m)   Weight:   Wt Readings from Last 1 Encounters:  06-11-19 (!) 144.7 kg   Ideal Body Weight:  59 kg  BMI:  Body mass index is 51.51 kg/m.  Estimated Nutritional Needs:   Kcal:  1455-1852 (11-14 kcal/kg)  Protein:  148 grams (2.5 grams/kg IBW)  Fluid:  UOP + 1 L  Willey Blade, MS, RD, LDN Office: [phone] Pager: [phone] After Hours/Weekend Pager: [phone]

## 2019-06-03 NOTE — Progress Notes (Signed)
Patient transported to morgue.

## 2019-06-03 NOTE — Progress Notes (Signed)
[facility]  ROUNDING NOTE   Subjective:   Hemodialysis last night. Did not tolerate treatment well. Restarted on norepinephrine before dialysis treatment. Ultrafiltration of 2 liters.   Discussed case with daughter, [name].   Objective:  Vital signs in last 24 hours:  Temp:  [98.4 F (36.9 C)-98.9 F (37.2 C)] 98.7 F (37.1 C) (06/17 0729) Pulse Rate:  [65-91] 89 (06/17 1000) Resp:  [0-30] 26 (06/17 1000) BP: (58-189)/(33-103) 132/53 (06/17 1000) SpO2:  [86 %-100 %] 92 % (06/17 1000) FiO2 (%):  [50 %-100 %] 90 % (06/17 0800) Weight:  [144.7 kg-147 kg] 144.7 kg (06/17 0417)  Weight change: 0.035 kg Filed Weights   05/19/19 0430 05/19/19 1950 22-May-2019 0417  Weight: (!) 147 kg (!) 147 kg (!) 144.7 kg    Intake/Output: I/O last 3 completed shifts: In: 2815.5 [I.V.:1198.1; NG/GT:1617.3] Out: 2000 [Other:2000]   Intake/Output this shift:  Total I/O In: 310.8 [I.V.:70.8; NG/GT:240] Out: -   Physical Exam: General: Critically ill   Head: ETT, OGT  Eyes: Anicteric   Neck: obese  Lungs:  Clear to auscultation, PRVC 90%  Heart: Regular rate and rhythm  Abdomen:  Soft, nontender, obese  Extremities: + peripheral edema.  Neurologic: Intubated and sedated  Skin: No lesions  Access: Right femoral temp HD catheter, clotted left arm AVG    Basic Metabolic Panel: Recent Labs  Lab 05/17/19 1820 05/17/19 2130 05/18/19 0115 05/18/19 0530 05/19/19 0412 05-22-19 0430  NA 138 138 138 138 137 138  K 4.5 4.3 4.4 4.5 3.8 3.6  CL 102 101 103 103 104 101  CO2 22 26 25 24 23 27   GLUCOSE 111* 124* 122* 128* 119* 152*  BUN 38* 33* 29* 26* 32* 25*  CREATININE 3.11* 2.76* 2.50* 2.40* 3.26* 2.68*  CALCIUM 8.2* 8.1* 8.0* 8.0* 7.8* 7.7*  MG 1.9 2.0 1.8 1.9 1.9  --   PHOS 3.4 2.9 2.8 2.8 2.7  --     Liver Function Tests: Recent Labs  Lab 05/17/19 1820 05/17/19 2130 05/18/19 0115 05/18/19 0530 05/19/19 0412  ALBUMIN 2.5* 2.5* 2.5* 2.4* 2.3*   No results for  input(s): LIPASE, AMYLASE in the last 168 hours. No results for input(s): AMMONIA in the last 168 hours.  CBC: Recent Labs  Lab 05/14/19 0429 05/15/19 0326 05/16/19 0555 05/16/19 1910 05/17/19 0628 05/18/19 0530 05/19/19 1817 05/22/19 0430  WBC 11.7* 15.8* 12.8* 17.0* 15.5* 10.7* 9.4 10.8*  NEUTROABS 9.5* 11.7* 9.7*  --  12.1*  --   --   --   HGB 7.9* 7.1* 6.9* 8.4* 8.3* 7.3* 7.1* 7.7*  HCT 24.8* 22.4* 22.8* 27.5* 27.2* 23.8* 23.7* 25.7*  MCV 93.9 94.9 98.7 100.0 98.6 99.2 102.2* 103.6*  PLT 132* 145* 155 157 172 156 242 242    Cardiac Enzymes: No results for input(s): CKTOTAL, CKMB, CKMBINDEX, TROPONINI in the last 168 hours.  BNP: Invalid input(s): POCBNP  CBG: Recent Labs  Lab 05/19/19 1540 05/19/19 1917 05/19/19 2332 05-22-19 0411 May 22, 2019 0749  GLUCAP 135* 143* 108* 137* 143*    Microbiology: Results for orders placed or performed during the hospital encounter of 04/14/2019  SARS Coronavirus 2 (CEPHEID - Performed in [place] hospital lab), Hosp Order     Status: None   Collection Time: 04/09/2019  7:31 AM   Specimen: Nasopharyngeal Swab  Result Value Ref Range Status   SARS Coronavirus 2 NEGATIVE NEGATIVE Final    Comment: (NOTE) If result is NEGATIVE SARS-CoV-2 target nucleic acids are NOT DETECTED.  The SARS-CoV-2 RNA is generally detectable in upper and lower  respiratory specimens during the acute phase of infection. The lowest  concentration of SARS-CoV-2 viral copies this assay can detect is 250  copies / mL. A negative result does not preclude SARS-CoV-2 infection  and should not be used as the sole basis for treatment or other  patient management decisions.  A negative result may occur with  improper specimen collection / handling, submission of specimen other  than nasopharyngeal swab, presence of viral mutation(s) within the  areas targeted by this assay, and inadequate number of viral copies  (<250 copies / mL). A negative result must be  combined with clinical  observations, patient history, and epidemiological information. If result is POSITIVE SARS-CoV-2 target nucleic acids are DETECTED. The SARS-CoV-2 RNA is generally detectable in upper and lower  respiratory specimens dur ing the acute phase of infection.  Positive  results are indicative of active infection with SARS-CoV-2.  Clinical  correlation with patient history and other diagnostic information is  necessary to determine patient infection status.  Positive results do  not rule out bacterial infection or co-infection with other viruses. If result is PRESUMPTIVE POSTIVE SARS-CoV-2 nucleic acids MAY BE PRESENT.   A presumptive positive result was obtained on the submitted specimen  and confirmed on repeat testing.  While 2019 novel coronavirus  (SARS-CoV-2) nucleic acids may be present in the submitted sample  additional confirmatory testing may be necessary for epidemiological  and / or clinical management purposes  to differentiate between  SARS-CoV-2 and other Sarbecovirus currently known to infect humans.  If clinically indicated additional testing with an alternate test  methodology [phone]) is advised. The SARS-CoV-2 RNA is generally  detectable in upper and lower respiratory sp ecimens during the acute  phase of infection. The expected result is Negative. Fact Sheet for Patients:  StrictlyIdeas.no Fact Sheet for Healthcare Providers: BankingDealers.co.za This test is not yet approved or cleared by the [place] FDA and has been authorized for detection and/or diagnosis of SARS-CoV-2 by FDA under an Emergency Use Authorization (EUA).  This EUA will remain in effect (meaning this test can be used) for the duration of the COVID-19 declaration under Section 564(b)(1) of the Act, 21 U.S.C. section 360bbb-3(b)(1), unless the authorization is terminated or revoked sooner. Performed at [facility], [address] [place], [place] [address]   MRSA PCR Screening     Status: None   Collection Time: 04/29/2019  9:04 AM   Specimen: Nasal Mucosa; Nasopharyngeal  Result Value Ref Range Status   MRSA by PCR NEGATIVE NEGATIVE Final    Comment:        The GeneXpert MRSA Assay (FDA approved for NASAL specimens only), is one component of a comprehensive MRSA colonization surveillance program. It is not intended to diagnose MRSA infection nor to guide or monitor treatment for MRSA infections. Performed at [facility], [address] [place], [place] [address]   Culture, respiratory (non-expectorated)     Status: None   Collection Time: 05/01/19 11:05 AM   Specimen: Tracheal Aspirate; Respiratory  Result Value Ref Range Status   Specimen Description   Final    TRACHEAL ASPIRATE Performed at [facility], [address] [place], [place] [address]    Special Requests   Final    NONE Performed at [name], [address] [place], [place] [address]    Gram Stain   Final    MODERATE WBC PRESENT, PREDOMINANTLY  PMN RARE YEAST Performed at [facility], [place] [address] [place], [place] [address]    Culture FEW CANDIDA ALBICANS FEW CANDIDA TROPICALIS   Final   Report Status 05/04/2019 FINAL  Final  Culture, respiratory     Status: None   Collection Time: 05/09/19  6:30 PM   Specimen: INDUCED SPUTUM  Result Value Ref Range Status   Specimen Description   Final    INDUCED SPUTUM Performed at [facility], [address] [place], [place] [address]    Special Requests   Final    NONE Performed at [facility], [address] [place], [place] [address]    Gram Stain   Final    MODERATE WBC PRESENT, PREDOMINANTLY PMN FEW GRAM NEGATIVE RODS Performed at [facility], [place] [address], [place], [place] [address]    Culture MODERATE PSEUDOMONAS AERUGINOSA  Final   Report Status 05/30/2019 FINAL  Final    Organism ID, Bacteria PSEUDOMONAS AERUGINOSA  Final      Susceptibility   Pseudomonas aeruginosa - MIC*    CEFTAZIDIME 2 SENSITIVE Sensitive     CIPROFLOXACIN <=0.25 SENSITIVE Sensitive     GENTAMICIN <=1 SENSITIVE Sensitive     IMIPENEM 2 SENSITIVE Sensitive     PIP/TAZO <=4 SENSITIVE Sensitive     CEFEPIME <=1 SENSITIVE Sensitive     * MODERATE PSEUDOMONAS AERUGINOSA    Coagulation Studies: No results for input(s): LABPROT, INR in the last 72 hours.  Urinalysis: No results for input(s): COLORURINE, LABSPEC, PHURINE, GLUCOSEU, HGBUR, BILIRUBINUR, KETONESUR, PROTEINUR, UROBILINOGEN, NITRITE, LEUKOCYTESUR in the last 72 hours.  Invalid input(s): APPERANCEUR    Imaging: Dg Abd 1 View  Result Date: 05/18/2019 CLINICAL DATA:  Nasogastric placement. EXAM: ABDOMEN - 1 VIEW COMPARISON:  05/11/2019 FINDINGS: Nasogastric tube enters the stomach in has its tip in the fundus proximal body. IMPRESSION: Nasogastric tube tip in the stomach in the fundus or proximal body. Electronically Signed   By: Nelson Chimes M.D.   On: 05/18/2019 11:41   Dg Chest Port 1 View  Result Date: 2019/06/04 CLINICAL DATA:  Hypoxia. EXAM: PORTABLE CHEST 1 VIEW COMPARISON:  05/15/2019 FINDINGS: Endotracheal tube has tip 3.8 cm above the carina. Enteric tube courses into the region of the stomach and tip is not visualized. Left IJ central venous catheter unchanged with tip over the SVC obliquely oriented. Lungs are adequately inflated with slight worsening moderate basilar opacification likely effusions with associated atelectasis. Infection in the mid to lower lungs is possible. Possible additional component of vascular congestion. Remainder the exam is unchanged. IMPRESSION: Slight interval worsening bibasilar opacification likely effusions with associated atelectasis. Infection in the mid to lower lungs is possible. Likely additional component of vascular congestion. Tubes and lines as described. Electronically Signed   By:  Marin Olp M.D.   On: 06-04-2019 08:10     Medications:   . sodium chloride 200 mL (05/10/19 1200)  . dextrose Stopped (05/18/19 2159)  . fentaNYL infusion INTRAVENOUS 200 mcg/hr (2019/06/04 1000)  . norepinephrine (LEVOPHED) Adult infusion 4 mcg/min (Jun 04, 2019 1000)   . allopurinol  150 mg Per Tube Once per day on Tue Thu Sat  . amitriptyline  25 mg Per Tube QHS  . atorvastatin  40 mg Per Tube Daily  . budesonide (PULMICORT) nebulizer solution  0.5 mg Nebulization BID  . chlorhexidine  15 mL Mouth/Throat BID  . Chlorhexidine Gluconate Cloth  6 each Topical Daily  . citalopram  20 mg Per Tube  Daily  . clopidogrel  75 mg Per Tube Daily  . clotrimazole  1 Applicatorful Vaginal QHS  . epoetin (EPOGEN/PROCRIT) injection  10,000 Units Intravenous Q T,Th,Sa-HD  . famotidine  20 mg Per Tube QHS  . feeding supplement (PRO-STAT SUGAR FREE 64)  30 mL Per Tube 5 X Daily  . feeding supplement (VITAL HIGH PROTEIN)  1,000 mL Per Tube Q24H  . free water  30 mL Per Tube Q4H  . heparin injection (subcutaneous)  5,000 Units Subcutaneous Q8H  . insulin aspart  0-15 Units Subcutaneous Q4H  . insulin aspart  4 Units Subcutaneous Q4H  . insulin glargine  5 Units Subcutaneous BID  . ipratropium-albuterol  3 mL Nebulization Q4H  . mouth rinse  15 mL Mouth Rinse 10 times per day  . midodrine  10 mg Oral Once per day on Tue Thu Sat  . multivitamin  15 mL Per Tube Daily  . polyethylene glycol  17 g Oral BID  . senna-docusate  1 tablet Per Tube BID  . sodium chloride flush  10-40 mL Intracatheter Q12H   albuterol, fentaNYL, fentaNYL (SUBLIMAZE) injection, lidocaine-prilocaine, LORazepam, midazolam, nystatin, polyethylene glycol, polyvinyl alcohol, sodium chloride flush, vecuronium  Assessment/ Plan:  Ms. KIANNA BILLET is a 63 y.o. black female with end stage renal disease on hemodialysis, hypertension, CVA, obstructive sleep apnea, peripheral vascular disease, hypertension, gout, GERD, diabetes  mellitus type II, COPD, coronary artery disease, congestive heart failure, whowas admitted to [facility] on 5/26/2020for pneumonia. Acute respiratory failure requiring mechanical ventilation and vasopressors.   TTS [facility] left AVG ([facility])  1. End stage renal disease: TTS schedule. Dialysis yesterday with ultrafiltration but significant hemodynamic instability.  - Dialysis scheduled for Thursday.   2. Anemia of chronic kidney disease: hemoglobin 7.7 - EPO with HD treatment TTS  3. Hypotension: requiring vasopressors.  - restarted midodrine before dialysis treatments.   4. Acute respiratory failure with cardiopulmonary arrest. Extubated 6/2, re-intubated 6/5 Per ENT patient not a candidate for tracheostomy   LOS: 22 Sarath Kolluru 2020/05/2309:17 AM

## 2019-06-03 NOTE — Progress Notes (Addendum)
Patient expired at 1552 hrs. Casey Burkitt, RN witnessed. Dr. Patsey Berthold notified. Patient's husband is notified. CDS notified of cardiac TOD. [facility].

## 2019-06-03 NOTE — TOC Progression Note (Signed)
Transition of Care [facility]) - Progression Note    Patient Details  Name: [name] MRN: [id number] Date of Birth: 1956-03-16  Transition of [facility] CM/SW Contact  Shela Leff, [place] Phone Number: 2019/06/07, 9:40 AM  Clinical Narrative:   Physician recommending transition to comfort care. CSW continues to follow.     Expected Discharge Plan: (unsure of discharge needs at this time) Barriers to Discharge: Continued Medical Work up  Expected Discharge Plan and Services Expected Discharge Plan: (unsure of discharge needs at this time)   Discharge Planning Services: CM Consult   Living arrangements for the past 2 months: Single Family Home                                       Social Determinants of Health (SDOH) Interventions    Readmission Risk Interventions No flowsheet data found.

## 2019-06-03 NOTE — Progress Notes (Signed)
[place] [facility] [place] NAME: Tamar Miano    MR#:  [id number]  DATE OF BIRTH:  Oct 09, 1956     Patient   Still on the ventilator.  CRRT done Saturday 90% FiO2.  PEEP 10 husband refused tracheostomy  palliative care consulted clinically deteriorating with very poor prognosis REVIEW OF SYSTEMS:   Unable to obtain due to patient being intubated and sedated  DRUG ALLERGIES:   Allergies  Allergen Reactions  . Iodine Rash  . Enalapril Other (See Comments)    TONGUE SWELLS/GOUT  . Iodinated Diagnostic Agents Other (See Comments)    BREAK OUT IN WHELPS      VITALS:  Blood pressure (!) 89/47, pulse 94, temperature 98.4 F (36.9 C), temperature source Axillary, resp. rate (!) 0, height 5' 5.98" (1.676 m), weight (!) 144.7 kg, SpO2 (!) 27 %.  PHYSICAL EXAMINATION:  GENERAL:  63 y.o.-morbidly obese and critically ill-appearing. EYES: Pupils equal, round, reactive to light  No scleral icterus.  HEENT: Head atraumatic, normocephalic.  Currently intubated, sedated. ETT in place. NECK:  Supple, no jugular venous distention. No thyroid enlargement, no tenderness.  LUNGS: +diffuse rhonchi and wheezing present CARDIOVASCULAR: RRR, S1, S2 normal. No murmurs, rubs, or gallops.  ABDOMEN: Soft, nontender, nondistended. Bowel sounds present.  EXTREMITIES: +pedal edema and bilateral hand edema, no cyanosis or clubbing.  NEUROLOGIC: Unable to do neuro exam because of intubation, sedation. PSYCHIATRIC: Intubated, sedated.   SKIN: No obvious rash, lesion, or ulcer.   LABORATORY PANEL:   CBC Recent Labs  Lab 06/15/2019 0430  WBC 10.8*  HGB 7.7*  HCT 25.7*  PLT 242   ------------------------------------------------------------------------------------------------------------------  Chemistries  Recent Labs  Lab 05/19/19 0412 06-15-19 0430  NA 137 138  K 3.8 3.6  CL 104 101  CO2 23 27  GLUCOSE 119* 152*  BUN 32* 25*  CREATININE 3.26*  2.68*  CALCIUM 7.8* 7.7*  MG 1.9  --    ------------------------------------------------------------------------------------------------------------------  Cardiac Enzymes No results for input(s): TROPONINI in the last 168 hours. ------------------------------------------------------------------------------------------------------------------  RADIOLOGY:  Dg Chest Port 1 View  Result Date: June 15, 2019 CLINICAL DATA:  Hypoxia. EXAM: PORTABLE CHEST 1 VIEW COMPARISON:  05/15/2019 FINDINGS: Endotracheal tube has tip 3.8 cm above the carina. Enteric tube courses into the region of the stomach and tip is not visualized. Left IJ central venous catheter unchanged with tip over the SVC obliquely oriented. Lungs are adequately inflated with slight worsening moderate basilar opacification likely effusions with associated atelectasis. Infection in the mid to lower lungs is possible. Possible additional component of vascular congestion. Remainder the exam is unchanged. IMPRESSION: Slight interval worsening bibasilar opacification likely effusions with associated atelectasis. Infection in the mid to lower lungs is possible. Likely additional component of vascular congestion. Tubes and lines as described. Electronically Signed   By: Marin Olp M.D.   On: Jun 15, 2019 08:10    EKG:   Orders placed or performed during the hospital encounter of 04/17/2019  . EKG 12-Lead  . EKG 12-Lead  . EKG 12-Lead  . EKG 12-Lead    ASSESSMENT AND PLAN:   63 year old female patient with history of CVA, obstructive sleep apnea on CPAP, ESRD on hemodialysis Tuesday, Thursday, Saturday, congestive heart failure, COPD comes in because of worsening shortness of breath, she was called in the emergency room, had PEA, intubated, admitted to intensive care unit.  Acute hypoxic and hypercapneic respiratory failure- status post PEA arrest. Also with aspiration pneumonia -Extubated 6/2 and then had  to be reintubated 6/5 -Vent  management per CCM . -Plan was for trach, but patient unable to obtain trach due to severe hypoxia, husband refused tracheostomy -Zosyn changed to ceftazidime -Sputum cultures growing pseudomonas   Shock- multi-factorial due to sepsis/hypovolemia/cardiogenic -Off pressors.  Wean off for map 65.  Failed weaning trials multiple times -Stress dose steroids given -  Failure to thrive Palliative care is involved and discussed with husband and daughter has been refused tracheostomy and changed her CODE STATUS to DO NOT RESUSCITATE, comfort care.  Compassionate terminal extubation  ESRD- on hemodialysis TTS. Temporary HD cath placed 6/5. -y  Type 2 diabetes- blood sugars well controlled -Lantus and SSI  Hyperlipidemia- stable - History of stroke   COPD- stable, no signs of acute exacerbation. -Continue nebs for comfort  Anemia in chronic kidney disease- hemoglobin low but stable. -  Failure to thrive palliative care is involved.  Husband refused tracheostomy.  No compressions and no shock  Extremely poor prognosis with high mortality Family is agreeable with comfort care  All the records are reviewed and case discussed with Care Management/Social Workerr. Management plans discussed with the patients daughter,   CODE STATUS: Partial code  TOTAL TIME TAKING CARE OF THIS PATIENT: 28 minutes.   Nicholes Mango M.D on 18-Jun-2019 at 2:50 PM  Between 7am to 6pm - Pager - [phone]  After 6pm go to [email or web address] - password [email or web address] [facility]  Office  [phone]  CC: Primary care physician; Smithson, [name], MD   Note: This dictation was prepared with Dragon dictation along with smaller phrase technology. Any transcriptional errors that result from this process are unintentional.

## 2019-06-03 NOTE — Progress Notes (Signed)
Updated patient's family at this time. Family coming in to see patient; moving towards comfort care.

## 2019-06-03 NOTE — Progress Notes (Signed)
Follow up - Critical Care Medicine Note  Patient Details:    [name] is an 63 y.o. female.morbidly obese,with severe end stage renal disease s/p cardiac arrest and hypothermia protocol with recurrent resp failure from acute diastolic heart failure and aspiration pneumonia  Lines, Airways, Drains: Airway 8 mm (Active)  Secured at (cm) 24 cm 05/09/2019  4:00 PM  Measured From Lips 05/23/2019  4:00 PM  Secured Location Right 05/05/2019  4:00 PM  Secured By [employer] 05/21/2019  4:00 PM  Tube Holder Repositioned Yes 05/10/2019  1:45 PM  Cuff Pressure (cm H2O) 26 cm H2O 05/11/2019  1:45 PM  Site Condition Cool;Dry 05/15/2019  4:00 PM     NG/OG Tube Orogastric Center mouth Xray (Active)  Cm Marking at Nare/Corner of Mouth (if applicable) 57 cm 12/08/1094  4:00 PM  Site Assessment Clean;Dry;Intact 05/15/2019  4:00 PM  Ongoing Placement Verification No change in cm markings or external length of tube from initial placement;No acute changes, not attributed to clinical condition 05/21/2019  4:00 PM  Status Infusing tube feed 05/28/2019  4:00 PM  Intake (mL) 35 mL 05/26/2019  5:03 PM    Anti-infectives:  Anti-infectives (From admission, onward)   Start     Dose/Rate Route Frequency Ordered Stop   05/15/19 2200  cefTAZidime (FORTAZ) 2 g in sodium chloride 0.9 % 100 mL IVPB  Status:  Discontinued     2 g 200 mL/hr over 30 Minutes Intravenous Every 12 hours 05/15/19 1500 05/18/19 1531   05/14/19 1800  cefTAZidime (FORTAZ) 1 g in sodium chloride 0.9 % 100 mL IVPB  Status:  Discontinued     1 g 200 mL/hr over 30 Minutes Intravenous Every 24 hours 05/14/19 1601 05/15/19 1500   05/10/19 0600  piperacillin-tazobactam (ZOSYN) IVPB 3.375 g  Status:  Discontinued     3.375 g 12.5 mL/hr over 240 Minutes Intravenous Every 8 hours 05/09/19 2136 05/14/19 1009   05/09/19 2100  piperacillin-tazobactam (ZOSYN) IVPB 3.375 g  Status:  Discontinued     3.375 g 12.5 mL/hr over 240 Minutes Intravenous Every 12  hours 05/09/19 1835 05/09/19 1937   05/09/19 2000  piperacillin-tazobactam (ZOSYN) IVPB 3.375 g  Status:  Discontinued     3.375 g 12.5 mL/hr over 240 Minutes Intravenous Every 12 hours 05/09/19 1937 05/09/19 2136   05/07/2019 0215  ceFAZolin (ANCEF) IVPB 1 g/50 mL premix     1 g 100 mL/hr over 30 Minutes Intravenous  Once 05/19/2019 0210 05/26/2019 0404   05/01/19 1200  azithromycin (ZITHROMAX) 500 mg in sodium chloride 0.9 % 250 mL IVPB  Status:  Discontinued     500 mg 250 mL/hr over 60 Minutes Intravenous Every 24 hours 05/01/19 0940 05/05/19 0915   05/01/19 1000  doxycycline (VIBRAMYCIN) 100 mg in sodium chloride 0.9 % 250 mL IVPB  Status:  Discontinued     100 mg 125 mL/hr over 120 Minutes Intravenous Every 12 hours 05/01/19 0940 05/05/19 0915   04/30/19 2200  ceFEPIme (MAXIPIME) 2 g in sodium chloride 0.9 % 100 mL IVPB  Status:  Discontinued     2 g 200 mL/hr over 30 Minutes Intravenous Every 24 hours 04/30/19 0708 04/30/19 1431   04/30/19 2200  ceFEPIme (MAXIPIME) 2 g in sodium chloride 0.9 % 100 mL IVPB  Status:  Discontinued     2 g 200 mL/hr over 30 Minutes Intravenous Every 12 hours 04/30/19 1431 05/01/19 0940   04/29/19 1800  ceFEPIme (MAXIPIME) 1 g in sodium  chloride 0.9 % 100 mL IVPB  Status:  Discontinued     1 g 200 mL/hr over 30 Minutes Intravenous Every 24 hours 04/29/2019 1156 04/03/2019 1340   04/29/19 1200  vancomycin (VANCOCIN) IVPB 1000 mg/200 mL premix  Status:  Discontinued     1,000 mg 200 mL/hr over 60 Minutes Intravenous Every 24 hours 04/18/2019 1340 04/14/2019 1853   05/01/2019 2200  ceFEPIme (MAXIPIME) 2 g in sodium chloride 0.9 % 100 mL IVPB  Status:  Discontinued     2 g 200 mL/hr over 30 Minutes Intravenous Every 12 hours 04/04/2019 1340 04/30/19 0708   04/08/2019 1200  vancomycin (VANCOCIN) IVPB 1000 mg/200 mL premix  Status:  Discontinued     1,000 mg 200 mL/hr over 60 Minutes Intravenous  Once 04/06/2019 1153 05/03/2019 1853   04/03/2019 0830  vancomycin (VANCOCIN) IVPB  1000 mg/200 mL premix     1,000 mg 200 mL/hr over 60 Minutes Intravenous  Once 04/07/2019 0820 05/01/2019 1200   04/19/2019 0830  ceFEPIme (MAXIPIME) 2 g in sodium chloride 0.9 % 100 mL IVPB     2 g 200 mL/hr over 30 Minutes Intravenous  Once 05/03/2019 0820 04/09/2019 1022      Microbiology: Results for orders placed or performed during the hospital encounter of 04/06/2019  SARS Coronavirus 2 (CEPHEID - Performed in [place] hospital lab), Hosp Order     Status: None   Collection Time: 04/09/2019  7:31 AM   Specimen: Nasopharyngeal Swab  Result Value Ref Range Status   SARS Coronavirus 2 NEGATIVE NEGATIVE Final    Comment: (NOTE) If result is NEGATIVE SARS-CoV-2 target nucleic acids are NOT DETECTED. The SARS-CoV-2 RNA is generally detectable in upper and lower  respiratory specimens during the acute phase of infection. The lowest  concentration of SARS-CoV-2 viral copies this assay can detect is 250  copies / mL. A negative result does not preclude SARS-CoV-2 infection  and should not be used as the sole basis for treatment or other  patient management decisions.  A negative result may occur with  improper specimen collection / handling, submission of specimen other  than nasopharyngeal swab, presence of viral mutation(s) within the  areas targeted by this assay, and inadequate number of viral copies  (<250 copies / mL). A negative result must be combined with clinical  observations, patient history, and epidemiological information. If result is POSITIVE SARS-CoV-2 target nucleic acids are DETECTED. The SARS-CoV-2 RNA is generally detectable in upper and lower  respiratory specimens dur ing the acute phase of infection.  Positive  results are indicative of active infection with SARS-CoV-2.  Clinical  correlation with patient history and other diagnostic information is  necessary to determine patient infection status.  Positive results do  not rule out bacterial infection or co-infection  with other viruses. If result is PRESUMPTIVE POSTIVE SARS-CoV-2 nucleic acids MAY BE PRESENT.   A presumptive positive result was obtained on the submitted specimen  and confirmed on repeat testing.  While 2019 novel coronavirus  (SARS-CoV-2) nucleic acids may be present in the submitted sample  additional confirmatory testing may be necessary for epidemiological  and / or clinical management purposes  to differentiate between  SARS-CoV-2 and other Sarbecovirus currently known to infect humans.  If clinically indicated additional testing with an alternate test  methodology [phone]) is advised. The SARS-CoV-2 RNA is generally  detectable in upper and lower respiratory sp ecimens during the acute  phase of infection. The expected result is Negative. Fact  Sheet for Patients:  StrictlyIdeas.no Fact Sheet for Healthcare Providers: BankingDealers.co.za This test is not yet approved or cleared by the [place] FDA and has been authorized for detection and/or diagnosis of SARS-CoV-2 by FDA under an Emergency Use Authorization (EUA).  This EUA will remain in effect (meaning this test can be used) for the duration of the COVID-19 declaration under Section 564(b)(1) of the Act, 21 U.S.C. section 360bbb-3(b)(1), unless the authorization is terminated or revoked sooner. Performed at [facility], [address] [place], [place] [address]   MRSA PCR Screening     Status: None   Collection Time: 04/19/2019  9:04 AM   Specimen: Nasal Mucosa; Nasopharyngeal  Result Value Ref Range Status   MRSA by PCR NEGATIVE NEGATIVE Final    Comment:        The GeneXpert MRSA Assay (FDA approved for NASAL specimens only), is one component of a comprehensive MRSA colonization surveillance program. It is not intended to diagnose MRSA infection nor to guide or monitor treatment for MRSA infections. Performed at [facility], [address] [place], [place] [address]   Culture, respiratory (non-expectorated)     Status: None   Collection Time: 05/01/19 11:05 AM   Specimen: Tracheal Aspirate; Respiratory  Result Value Ref Range Status   Specimen Description   Final    TRACHEAL ASPIRATE Performed at [facility], [address], [place], [place] [address]    Special Requests   Final    NONE Performed at [facility], [address] [place], [place] [address]    Gram Stain   Final    MODERATE WBC PRESENT, PREDOMINANTLY PMN RARE YEAST Performed at [facility], [place] [address] [place], [place] [address]    Culture FEW CANDIDA ALBICANS FEW CANDIDA TROPICALIS   Final   Report Status 05/04/2019 FINAL  Final  Culture, respiratory     Status: None   Collection Time: 05/09/19  6:30 PM   Specimen: INDUCED SPUTUM  Result Value Ref Range Status   Specimen Description   Final    INDUCED SPUTUM Performed at [facility], [address] [place], [place] [address]    Special Requests   Final    NONE Performed at [facility], [address], [place], [place] [address]    Gram Stain   Final    MODERATE WBC PRESENT, PREDOMINANTLY PMN FEW GRAM NEGATIVE RODS Performed at [facility], [place] [address], [place],  [address]    Culture MODERATE PSEUDOMONAS AERUGINOSA  Final   Report Status 05/11/2019 FINAL  Final   Organism ID, Bacteria PSEUDOMONAS AERUGINOSA  Final      Susceptibility   Pseudomonas aeruginosa - MIC*    CEFTAZIDIME 2 SENSITIVE Sensitive     CIPROFLOXACIN <=0.25 SENSITIVE Sensitive     GENTAMICIN <=1 SENSITIVE Sensitive     IMIPENEM 2 SENSITIVE Sensitive     PIP/TAZO <=4 SENSITIVE Sensitive     CEFEPIME <=1 SENSITIVE Sensitive     * MODERATE PSEUDOMONAS AERUGINOSA    Best Practice/Protocols:  VTE Prophylaxis: Heparin (drip) GI Prophylaxis: Antihistamine   Continous Sedation Hyperglycemia (ICU)  Events: 5/26 ICU admission for resp failure  and cardiac arrest 5/26 hypothermia protocol 6/3 successfully extubated 6/5 re-admitted to the ICU for aspiration pneumonia and progressive diastolic heart failure and renal failure 6/6 plan for [facility], ENT consulted, remains critically ill on vent 6/6 near cardiac arrest, fio2 100%, PEEP 15 6/7 severe resp failure,  multiorgan failure 6/9 declined for trach due to high FiO2 requirements  Studies: Dg Abd 1 View  Result Date: 05/18/2019 CLINICAL DATA:  Nasogastric placement. EXAM: ABDOMEN - 1 VIEW COMPARISON:  05/06/2019 FINDINGS: Nasogastric tube enters the stomach in has its tip in the fundus proximal body. IMPRESSION: Nasogastric tube tip in the stomach in the fundus or proximal body. Electronically Signed   By: Nelson Chimes M.D.   On: 05/18/2019 11:41   Dg Abd 1 View  Result Date: 05/07/2019 CLINICAL DATA:  OG tube placement. EXAM: ABDOMEN - 1 VIEW COMPARISON:  04/15/2019. FINDINGS: OG tube noted with tip below left hemidiaphragm. Severely distended loops of bowel are noted. Abdominal series suggested for further evaluation. No definite free air noted. IMPRESSION: NG tube noted with its tip under the left hemidiaphragm. Severely distended loops of bowel are noted. Abdominal series suggested for further evaluation. Electronically Signed   By: Marcello Moores  Register   On: 05/14/2019 15:50   Dg Chest Port 1 View  Result Date: 06-14-19 CLINICAL DATA:  Hypoxia. EXAM: PORTABLE CHEST 1 VIEW COMPARISON:  05/15/2019 FINDINGS: Endotracheal tube has tip 3.8 cm above the carina. Enteric tube courses into the region of the stomach and tip is not visualized. Left IJ central venous catheter unchanged with tip over the SVC obliquely oriented. Lungs are adequately inflated with slight worsening moderate basilar opacification likely effusions with associated atelectasis. Infection in the mid to lower lungs is possible. Possible additional component of vascular congestion. Remainder the exam is unchanged. IMPRESSION:  Slight interval worsening bibasilar opacification likely effusions with associated atelectasis. Infection in the mid to lower lungs is possible. Likely additional component of vascular congestion. Tubes and lines as described. Electronically Signed   By: Marin Olp M.D.   On: Jun 14, 2019 08:10   Dg Chest Port 1 View  Result Date: 05/15/2019 CLINICAL DATA:  Acute respiratory failure EXAM: PORTABLE CHEST 1 VIEW COMPARISON:  05/13/2019 FINDINGS: Cardiac shadow is stable. Endotracheal tube, gastric catheter and hero graft are again noted and stable. Mild vascular congestion is seen with small bilateral pleural effusions and bibasilar atelectasis. The overall appearance is roughly stable from the prior exam. IMPRESSION: Stable vascular congestion with effusions and atelectasis. Tubes and lines as described. Electronically Signed   By: Inez Catalina M.D.   On: 05/15/2019 08:11   Dg Chest Port 1 View  Result Date: 05/13/2019 CLINICAL DATA:  Respiratory failure EXAM: PORTABLE CHEST 1 VIEW COMPARISON:  05/10/2019 FINDINGS: Endotracheal tube and nasogastric catheter are noted in satisfactory position. Hero graft is again seen and unchanged. Cardiac shadow is stable. The overall inspiratory effort is poor with elevation of the right hemidiaphragm. Small effusions are noted bilaterally. Mild bibasilar atelectasis is again seen. No new focal abnormality is noted. IMPRESSION: Stable appearance of the chest with small effusions and bibasilar atelectasis. Electronically Signed   By: Inez Catalina M.D.   On: 05/13/2019 07:22   Dg Chest Port 1 View  Result Date: 05/10/2019 CLINICAL DATA:  Acute respiratory failure. EXAM: PORTABLE CHEST 1 VIEW COMPARISON:  Chest radiograph 05/09/2019 FINDINGS: ET tube terminates in the mid trachea. Enteric tube courses inferior to the diaphragm. Central venous catheter tip projects over the superior vena cava. Stable cardiomegaly. Elevation right hemidiaphragm. Similar-appearing bilateral  mid lower lung heterogeneous opacities. Probable small left pleural effusion. IMPRESSION: Similar-appearing mid and lower lung airspace opacities. Possible small left pleural effusion. Electronically Signed   By: Lovey Newcomer M.D.   On: 05/10/2019 06:47   Dg Chest [facility]  1 View  Result Date: 05/09/2019 CLINICAL DATA:  Congestive heart failure.  Hypoxia.  Ex-smoker. EXAM: PORTABLE CHEST 1 VIEW COMPARISON:  05/09/2019 at 0552 hours. FINDINGS: 1817 hours. Large bore left-sided central line terminates at the high SVC. Endotracheal tube is difficult to visualize centrally but likely terminates 2.9 cm above carina. Nasogastric tube extends beyond the inferior aspect of the film. Mildly degraded exam due to AP portable technique and patient body habitus. Normal heart size for level of inspiration. Right costophrenic angle minimally excluded. moderate right hemidiaphragm elevation with extremely low lung volumes. No overt congestive heart failure. Suspect residual right base airspace disease. IMPRESSION: Cardiomegaly and extremely low lung volumes. Probable residual right base airspace disease/atelectasis. Electronically Signed   By: Abigail Miyamoto M.D.   On: 05/09/2019 18:59   Dg Chest Port 1 View  Result Date: 05/09/2019 CLINICAL DATA:  Acute respiratory failure EXAM: PORTABLE CHEST 1 VIEW COMPARISON:  05/27/2019 FINDINGS: Endotracheal tube tip is just above the level of the carina. Retraction by 2-3 cm would place it at the level of the clavicular heads. Enteric tube side port projects over the stomach. Left IJ large bore central venous catheter is unchanged. Bibasilar atelectasis. IMPRESSION: Endotracheal tube tip just above the level of the carina. Retraction by 2-3 cm is recommended. Electronically Signed   By: Ulyses Jarred M.D.   On: 05/09/2019 06:21   Dg Chest Port 1 View  Result Date: 05/19/2019 CLINICAL DATA:  Intubation EXAM: PORTABLE CHEST 1 VIEW COMPARISON:  Portable exam 1454 hours compared to 05/07/2019  FINDINGS: Tip of endotracheal tube projects 2.1 cm above carina. Nasogastric tube extends into abdomen. Rotated to the RIGHT. LEFT jugular large-bore central venous catheter with tip projecting over SVC. Enlargement of cardiac silhouette with pulmonary vascular congestion. Probable pulmonary edema and layered RIGHT pleural effusion IMPRESSION: Tip of endotracheal tube projects 2.1 cm above carina. Question pulmonary edema and layering RIGHT pleural effusion. Electronically Signed   By: Lavonia Dana M.D.   On: 05/07/2019 15:49   Dg Chest Port 1 View  Result Date: 05/07/2019 CLINICAL DATA:  Shortness of breath. EXAM: PORTABLE CHEST 1 VIEW COMPARISON:  Radiographs of May 06, 2019. FINDINGS: Stable cardiomediastinal silhouette. Left internal jugular catheter is unchanged. No pneumothorax or significant pleural effusion is noted. Minimal bibasilar subsegmental atelectasis is noted. Bony thorax is unremarkable. IMPRESSION: Minimal bibasilar subsegmental atelectasis. Electronically Signed   By: Marijo Conception M.D.   On: 05/07/2019 09:55   Dg Chest Port 1 View  Result Date: 05/06/2019 CLINICAL DATA:  Shortness of breath EXAM: PORTABLE CHEST 1 VIEW COMPARISON:  Two days ago FINDINGS: Cardiomegaly and vascular pedicle widening. Very low volume chest with hazy opacity on both sides. Dialysis catheter from the left with tip near the SVC origin. Trachea and esophagus have been extubated. IMPRESSION: Unchanged very low lung volumes with atelectasis and vascular congestion. There may be layering pleural effusions. Electronically Signed   By: Monte Fantasia M.D.   On: 05/06/2019 05:31   Dg Chest Port 1 View  Result Date: 05/04/2019 CLINICAL DATA:  Acute respiratory failure EXAM: PORTABLE CHEST 1 VIEW COMPARISON:  05/01/2019 FINDINGS: Endotracheal tube, gastric catheter and hero graft are again noted and stable. Cardiac shadow is stable. Right upper lobe atelectasis is noted along the minor fissure new from the prior exam.  Some improved aeration in right base is noted when compare with the prior exam. Mild vascular congestion is noted. Small left effusion is noted. IMPRESSION: Improved aeration in the right  base although some right upper lobe atelectasis is noted. Mild vascular congestion. Electronically Signed   By: Inez Catalina M.D.   On: 05/04/2019 07:17   Dg Chest Port 1 View  Result Date: 05/01/2019 CLINICAL DATA:  Shortness of breath. EXAM: PORTABLE CHEST 1 VIEW COMPARISON:  Chest x-ray from same day at 3:54 a.m. FINDINGS: The patient remains rotated to the right. Unchanged endotracheal and enteric tubes. Unchanged left HeRO graft. Stable cardiomegaly. Normal pulmonary vascularity. Chronic elevation of the right hemidiaphragm with right basilar atelectasis/scarring. Unchanged hazy opacity at the left lung base likely reflecting a combination of atelectasis and small left pleural effusion. No pneumothorax. No acute osseous abnormality. IMPRESSION: 1. Stable chest with bibasilar atelectasis and probable small left pleural effusion. Electronically Signed   By: Titus Dubin M.D.   On: 05/01/2019 10:20   Dg Chest Port 1 View  Result Date: 05/01/2019 CLINICAL DATA:  Acute respiratory failure. EXAM: PORTABLE CHEST 1 VIEW COMPARISON:  04/29/2019. FINDINGS: Endotracheal tube, NG tube, large caliber left subclavian line in stable position. Stable cardiomegaly. Bilateral pulmonary infiltrates/edema slightly progressed from prior exam. Bibasilar atelectasis particular prominent right lung base. Bilateral pleural effusions cannot be excluded. No pneumothorax. IMPRESSION: 1.  Lines and tubes stable position. 2. Cardiomegaly unchanged. Diffuse bilateral pulmonary infiltrates/edema slightly progressed from prior exam. Bibasilar atelectasis particular prominent the right lung base. Bilateral pleural effusions cannot be excluded. Electronically Signed   By: Marcello Moores  Register   On: 05/01/2019 06:15   Dg Chest Port 1 View  Result  Date: 04/29/2019 CLINICAL DATA:  Acute respiratory failure. EXAM: PORTABLE CHEST 1 VIEW COMPARISON:  Single-view of the chest earlier today and 04/23/2019. FINDINGS: Support tubes and lines are unchanged since the most recent comparison. There is cardiomegaly without edema. Bibasilar airspace disease has worsened on the right. No pneumothorax. There may be a right pleural effusion. IMPRESSION: No change in support apparatus. Bibasilar airspace disease is worse on the right. There may be a right pleural effusion. Cardiomegaly. Electronically Signed   By: Inge Rise M.D.   On: 04/29/2019 18:32   Dg Chest Port 1 View  Result Date: 04/29/2019 CLINICAL DATA:  Acute respiratory failure EXAM: PORTABLE CHEST 1 VIEW COMPARISON:  Yesterday FINDINGS: Endotracheal tube tip between the clavicular heads and carina. Left subclavian dialysis catheter with tip at the brachiocephalic SVC confluence. There is an orogastric tube which at least reaches the stomach. Haziness of the bilateral lower chest with obscuration of the left diaphragm. No Kerley lines, effusion, or pneumothorax. Cardiomegaly.  Rightward rotation. IMPRESSION: 1. Stable hardware positioning. 2. Worsening lower lobe aeration. Electronically Signed   By: Monte Fantasia M.D.   On: 04/29/2019 06:50   Dg Chest Port 1 View  Result Date: 04/23/2019 CLINICAL DATA:  63 year old female history intubation EXAM: PORTABLE CHEST 1 VIEW COMPARISON:  04/18/2019, 03/28/2015 FINDINGS: Cardiomediastinal silhouette unchanged in size and contour with low lung volumes accentuating the diameter of the mediastinum. Asymmetric elevation of the right hemidiaphragm. Coarsened interstitial markings with interlobular septal thickening and thickening of the minor fissure. Patchy opacity in the right lung base again demonstrated. Interval placement of endotracheal tube which terminates 2.5 cm above the carina. Interval placement of gastric tube terminating out of the field of view.  Interval placement of defibrillator pads. Left upper extremity HERO graft, with the tip terminating in the region of the superior vena cava. IMPRESSION: Interval placement of endotracheal tube terminating 2.5 cm above the carina. Interval placement of gastric tube terminating in the abdomen out  of the field of view. Interval placement of defibrillator pads. Similar appearance of low lung volumes, with background changes of either chronic scarring and/or early pulmonary edema, with similar appearance airspace disease at the right lung base. Left upper extremity HeRO graft Electronically Signed   By: Corrie Mckusick D.O.   On: 04/14/2019 13:18   Dg Chest Portable 1 View  Result Date: 04/07/2019 CLINICAL DATA:  Short of breath EXAM: PORTABLE CHEST 1 VIEW COMPARISON:  03/28/2015 FINDINGS: New left upper extremity and jugular hair 0 catheter with its tip in the upper SVC. Cardiomegaly. Normal vascularity. Bibasilar opacities likely combination of volume loss and pleural fluid. No pneumothorax. Triangular opacity at the medial right lung base is noted representing either consolidation in the right lower lobe or collapse. IMPRESSION: Right lower lobe collapse versus consolidation. Bibasilar opacities likely combination of pleural fluid and atelectasis. Electronically Signed   By: Marybelle Killings M.D.   On: 05/01/2019 08:04   Dg Abd Portable 1v  Result Date: 04/03/2019 CLINICAL DATA:  OG tube placement. EXAM: PORTABLE ABDOMEN - 1 VIEW COMPARISON:  One-view chest x-ray FINDINGS: OG tube terminates in the stomach. The stomach is mildly distended. The lung bases are clear. IMPRESSION: 1. OG tube terminates in the stomach. Electronically Signed   By: San Morelle M.D.   On: 04/25/2019 13:29    Consults: Treatment Team:  Algernon Huxley, MD [facility], Ander Gaster, MD Beverly Gust, MD   Subjective:    Overnight Issues: Remains sedated and on the ventilator.  Did not tolerate hemodialysis yesterday.  FiO2  requirements have increased again today requiring anywhere between 90 to 100% FiO2 to maintain saturation at 89% or better.  Requiring norepinephrine again.  Wake-up assessment, she opens eyes does not track, appears uncomfortable, not following commands.  Objective:  Vital signs for last 24 hours: Temp:  [98.4 F (36.9 C)-98.9 F (37.2 C)] 98.4 F (36.9 C) (06/17 1200) Pulse Rate:  [65-92] 88 (06/17 1330) Resp:  [0-30] 0 (06/17 1330) BP: (58-189)/(33-103) 80/44 (06/17 1330) SpO2:  [86 %-100 %] 91 % (06/17 1330) FiO2 (%):  [50 %-100 %] 100 % (06/17 1230) Weight:  [144.7 kg-147 kg] 144.7 kg (06/17 0417)  Hemodynamic parameters for last 24 hours:    Intake/Output from previous day: 06/16 0701 - 06/17 0700 In: 2016.5 [I.V.:776.5; NG/GT:1240] Out: 2000   Intake/Output this shift: Total I/O In: 554.4 [I.V.:164.4; NG/GT:390] Out: -   Vent settings for last 24 hours: Vent Mode: PRVC FiO2 (%):  [50 %-100 %] 100 % Set Rate:  [26 bmp] 26 bmp Vt Set:  [480 mL] 480 mL PEEP:  [10 cmH20] 10 cmH20 Plateau Pressure:  [24 cmH20-26 cmH20] 26 cmH20  Physical Exam:  GENERAL: Massively obese, intubated, sedated, opens eyes and does not track.  Following commands today.   HEAD: Normocephalic, atraumatic.  EYES: Pupils equal, round, reactive to light. No scleral icterus.  MOUTH: Moist membranes. NECK: Very thick neck, supple.  Trachea midline, no crepitus.  PULMONARY: Coarse breath sounds throughout, rales throughout. CARDIOVASCULAR: S1 and S2. Regular rate and rhythm. No murmurs, rubs, or gallops.  GASTROINTESTINAL: Soft, protuberant.Positive bowel sounds.  MUSCULOSKELETAL: Anasarca 4+ evident, no clubbing, no joint deformity.   NEUROLOGIC: Sedated but arousable, appears disoriented when awakened.  Not following commands. SKIN:intact,warm,dry stage 2 skin breakdown, sacral noted by nursing 6/13.  Assessment/Plan:  1.  Acute on chronic hypoxic/hypercarbic respiratory failure: Her FiO2  requirements have increased again.  It is evident that she will not be able to  wean.  Tracheostomy not an option due to persistent high FiO2 requirements, anatomy, issues with venous congestion related to HERO catheter, and prior venous thrombosis.  She had prior chronic combined respiratory failure due to obesity with obesity hypoventilation.  She was on home O2.  Situation discussed with family, they have visited at the bedside.  They understand that the patient will not improve at this point.  They have decided to transition to comfort care.  2.  Aspiration pneumonia: Has completed antibiotic therapy  3.  Acute on chronic diastolic heart failure:   Continues to show significant volume overload however is not tolerating dialysis due to hypotension even with pressor support.  Transitioning to comfort care as above.  4.  End-stage renal disease with anasarca: Issue is complicated by clotted AV graft.  Has a right femoral dialysis catheter, access is challenging.  Presence of old HERO catheter on left.  Discussed with Dr. Juleen China.  Have discussed with patient's family.  Cussed with Dr. Juleen China again.  Patient being transitioned to comfort care.  5.  Shock:  Persistent shock physiology, has required initiation of pressors again.  This is very poor prognosis etiology is multifactorial.  6.  Super morbid obesity: This issue adds extreme complexity to her management  7.  Protocols: As above.  8.  Pressure injury skin: Early skin breakdown despite positioning and specialized bed, patient's massive obesity complicates this issue.  Prognosis is poor.  Palliative care assisting with end-of-life discussions and to establish goals of care.  Family has now visited at the bedside.  Transitioning to comfort care.   LOS: 22 days   Additional comments: Multidisciplinary rounds were performed with ICU team.  Critical Care Total Time*: 40 minutes  C. Derrill Kay, MD [facility] 06/03/2019  *Care  during the described time interval was provided by me and/or other providers on the critical care team.  I have reviewed this patient's available data, including medical history, events of note, physical examination and test results as part of my evaluation.

## 2019-06-03 NOTE — Progress Notes (Signed)
Palliative:   [name], [name], is lying in bed intubated/ventilated and sedated.  She will open her eyes but not make eye contact. There is no family at bedside at this time dt visitor restrictions. Unfortunately she has worsened overnight and is now requiring 90% FiO2.   Call to husband [name] and daughter [name].  We talk about [name]'s worsening health.  I share the medical team's suggesting that we focus on comfort, we believe that [name] is dying.  I ask family to come to the hospital.  At first [name] states he will come to the hospital on Friday, but I share that I do not believe she will last that [place]. [name] tells me that they will come.   Conference with nursing staff, family has visited and is ready to focus on comfort, compassionate extubation.  Orders in place.   50 minutes, extended time Quinn Axe, NP Palliative Medicine Team Team Phone # [phone] Greater than 50% of this time was spent counseling and coordinating care related to the above assessment and plan.

## 2019-06-03 NOTE — Plan of Care (Addendum)
CDS referral completed. Patient not a candidate for donation.

## 2019-06-03 DEATH — deceased

## 2019-06-26 ENCOUNTER — Ambulatory Visit (INDEPENDENT_AMBULATORY_CARE_PROVIDER_SITE_OTHER): Payer: Medicare Other | Admitting: Vascular Surgery

## 2019-06-26 ENCOUNTER — Encounter (INDEPENDENT_AMBULATORY_CARE_PROVIDER_SITE_OTHER): Payer: Medicare Other
